# Patient Record
Sex: Female | Born: 1935
Health system: Southern US, Community
[De-identification: ages and names within clinical notes are randomized; demographics above are authoritative.]

## PROBLEM LIST (undated history)

## (undated) DIAGNOSIS — I639 Cerebral infarction, unspecified: Secondary | ICD-10-CM

## (undated) DIAGNOSIS — E039 Hypothyroidism, unspecified: Secondary | ICD-10-CM

## (undated) DIAGNOSIS — M5126 Other intervertebral disc displacement, lumbar region: Secondary | ICD-10-CM

## (undated) DIAGNOSIS — N390 Urinary tract infection, site not specified: Secondary | ICD-10-CM

## (undated) DIAGNOSIS — R7303 Prediabetes: Secondary | ICD-10-CM

## (undated) DIAGNOSIS — F329 Major depressive disorder, single episode, unspecified: Secondary | ICD-10-CM

## (undated) DIAGNOSIS — R609 Edema, unspecified: Secondary | ICD-10-CM

## (undated) DIAGNOSIS — Z8619 Personal history of other infectious and parasitic diseases: Secondary | ICD-10-CM

## (undated) DIAGNOSIS — R42 Dizziness and giddiness: Secondary | ICD-10-CM

## (undated) DIAGNOSIS — C50919 Malignant neoplasm of unspecified site of unspecified female breast: Secondary | ICD-10-CM

## (undated) DIAGNOSIS — E079 Disorder of thyroid, unspecified: Secondary | ICD-10-CM

## (undated) DIAGNOSIS — E669 Obesity, unspecified: Secondary | ICD-10-CM

## (undated) DIAGNOSIS — M1990 Unspecified osteoarthritis, unspecified site: Secondary | ICD-10-CM

## (undated) DIAGNOSIS — Z973 Presence of spectacles and contact lenses: Secondary | ICD-10-CM

## (undated) DIAGNOSIS — I10 Essential (primary) hypertension: Secondary | ICD-10-CM

## (undated) DIAGNOSIS — F32A Depression, unspecified: Secondary | ICD-10-CM

## (undated) DIAGNOSIS — M5410 Radiculopathy, site unspecified: Secondary | ICD-10-CM

## (undated) HISTORY — PX: TOTAL KNEE ARTHROPLASTY: SHX125

## (undated) HISTORY — DX: Malignant neoplasm of unspecified site of unspecified female breast: C50.919

## (undated) HISTORY — DX: Prediabetes: R73.03

## (undated) HISTORY — PX: COLONOSCOPY W/ BIOPSIES AND POLYPECTOMY: SHX1376

## (undated) HISTORY — DX: Unspecified osteoarthritis, unspecified site: M19.90

## (undated) HISTORY — DX: Essential (primary) hypertension: I10

## (undated) HISTORY — DX: Disorder of thyroid, unspecified: E07.9

## (undated) HISTORY — PX: JOINT REPLACEMENT: SHX530

## (undated) HISTORY — PX: EYE SURGERY: SHX253

## (undated) HISTORY — PX: BACK SURGERY: SHX140

## (undated) HISTORY — PX: BREAST SURGERY: SHX581

## (undated) HISTORY — PX: KNEE ARTHROSCOPY: SUR90

## (undated) HISTORY — DX: Radiculopathy, site unspecified: M54.10

## (undated) HISTORY — DX: Personal history of other infectious and parasitic diseases: Z86.19

## (undated) HISTORY — DX: Obesity, unspecified: E66.9

## (undated) HISTORY — DX: Edema, unspecified: R60.9

## (undated) HISTORY — PX: TONSILLECTOMY: SUR1361

## (undated) NOTE — *Deleted (*Deleted)
Patient alert, cooperative denies pain, verbalize concerning waiting to go back to her home but was sad tha she could not " I have lost my home and now I have to find somewhere else to live because I had this stroke".Encourage verbalization of he concerns ,reasurrance and support provided. States she had  Spoken to her family and they were supportive. Monitor and assisted with tolecting and basic ADL needs continue regime, call bell within reach, bed alarm on.

## (undated) NOTE — *Deleted (*Deleted)
Physical Therapy Weekly Progress Note  Patient Details  Name: Tara [name] MRN: [id number] Date of Birth: [birth year]  Beginning of progress report period: {Time; dates multiple:304500300} End of progress report period: {Time; dates multiple:304500300}  {CHL IP REHAB PT TIME CALCULATION:304800500}  Patient has met {number 1-5:22450} of {number 1-5:20334} short term goals.  ***  Patient continues to demonstrate the following deficits {impairments:3041632} and therefore will continue to benefit from skilled PT intervention to increase functional independence with mobility.  Patient {LTG progression:3041653}.  {plan of WUJW:1191478}  PT Short Term Goals {GNF:6213086}  Skilled Therapeutic Interventions/Progress Updates:      Therapy Documentation Precautions:  Precautions Precautions: Fall Precaution Comments: history of vertigo, R field cut Restrictions Weight Bearing Restrictions: No General:   Vital Signs:  Pain: Pain Assessment Pain Scale: 0-10 Pain Score: 0-No pain Faces Pain Scale: No hurt Pain Type: Acute pain Vision/Perception     Mobility:   Locomotion :    Trunk/Postural Assessment :    Balance:   Exercises:   Other Treatments:     Therapy/Group: {Therapy/Group:3049007}  Westley Foots 09/14/2020, 1:45 PM
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
## 1992-11-21 HISTORY — PX: ANKLE SURGERY: SHX546

## 1999-01-25 ENCOUNTER — Other Ambulatory Visit: Admission: RE | Admit: 1999-01-25 | Discharge: 1999-01-25 | Payer: Self-pay | Admitting: Obstetrics and Gynecology

## 2000-01-05 ENCOUNTER — Encounter: Payer: Self-pay | Admitting: Internal Medicine

## 2000-01-05 ENCOUNTER — Encounter: Admission: RE | Admit: 2000-01-05 | Discharge: 2000-01-05 | Payer: Self-pay | Admitting: Internal Medicine

## 2000-12-29 ENCOUNTER — Other Ambulatory Visit: Admission: RE | Admit: 2000-12-29 | Discharge: 2000-12-29 | Payer: Self-pay | Admitting: Obstetrics and Gynecology

## 2002-01-02 ENCOUNTER — Other Ambulatory Visit: Admission: RE | Admit: 2002-01-02 | Discharge: 2002-01-02 | Payer: Self-pay | Admitting: Obstetrics and Gynecology

## 2003-02-03 ENCOUNTER — Other Ambulatory Visit: Admission: RE | Admit: 2003-02-03 | Discharge: 2003-02-03 | Payer: Self-pay | Admitting: Obstetrics and Gynecology

## 2003-04-11 ENCOUNTER — Encounter: Payer: Self-pay | Admitting: Internal Medicine

## 2003-04-11 ENCOUNTER — Encounter: Admission: RE | Admit: 2003-04-11 | Discharge: 2003-04-11 | Payer: Self-pay | Admitting: Internal Medicine

## 2005-05-19 ENCOUNTER — Other Ambulatory Visit: Admission: RE | Admit: 2005-05-19 | Discharge: 2005-05-19 | Payer: Self-pay | Admitting: Obstetrics and Gynecology

## 2006-05-26 ENCOUNTER — Other Ambulatory Visit: Admission: RE | Admit: 2006-05-26 | Discharge: 2006-05-26 | Payer: Self-pay | Admitting: Obstetrics and Gynecology

## 2007-05-09 ENCOUNTER — Other Ambulatory Visit: Admission: RE | Admit: 2007-05-09 | Discharge: 2007-05-09 | Payer: Self-pay | Admitting: Obstetrics and Gynecology

## 2008-05-20 ENCOUNTER — Other Ambulatory Visit: Admission: RE | Admit: 2008-05-20 | Discharge: 2008-05-20 | Payer: Self-pay | Admitting: Obstetrics and Gynecology

## 2008-06-02 ENCOUNTER — Encounter: Admission: RE | Admit: 2008-06-02 | Discharge: 2008-06-02 | Payer: Self-pay | Admitting: Internal Medicine

## 2008-09-30 ENCOUNTER — Ambulatory Visit: Payer: Self-pay | Admitting: Internal Medicine

## 2008-12-04 ENCOUNTER — Ambulatory Visit: Payer: Self-pay | Admitting: Internal Medicine

## 2009-06-05 ENCOUNTER — Ambulatory Visit: Payer: Self-pay | Admitting: Internal Medicine

## 2009-06-16 ENCOUNTER — Ambulatory Visit: Payer: Self-pay | Admitting: Internal Medicine

## 2009-06-19 LAB — HM COLONOSCOPY

## 2009-06-23 ENCOUNTER — Ambulatory Visit: Payer: Self-pay | Admitting: Obstetrics and Gynecology

## 2009-06-23 ENCOUNTER — Encounter: Payer: Self-pay | Admitting: Obstetrics and Gynecology

## 2009-06-23 ENCOUNTER — Other Ambulatory Visit: Admission: RE | Admit: 2009-06-23 | Discharge: 2009-06-23 | Payer: Self-pay | Admitting: Obstetrics and Gynecology

## 2009-06-24 LAB — HM MAMMOGRAPHY

## 2009-07-01 ENCOUNTER — Ambulatory Visit: Payer: Self-pay | Admitting: Obstetrics and Gynecology

## 2009-07-20 ENCOUNTER — Ambulatory Visit: Payer: Self-pay | Admitting: Internal Medicine

## 2009-09-29 ENCOUNTER — Ambulatory Visit: Payer: Self-pay | Admitting: Obstetrics and Gynecology

## 2009-11-21 HISTORY — PX: ROTATOR CUFF REPAIR: SHX139

## 2010-01-19 ENCOUNTER — Encounter: Admission: RE | Admit: 2010-01-19 | Discharge: 2010-01-19 | Payer: Self-pay | Admitting: Orthopedic Surgery

## 2010-01-28 ENCOUNTER — Ambulatory Visit: Payer: Self-pay | Admitting: Internal Medicine

## 2010-08-19 ENCOUNTER — Other Ambulatory Visit: Admission: RE | Admit: 2010-08-19 | Discharge: 2010-08-19 | Payer: Self-pay | Admitting: Obstetrics and Gynecology

## 2010-08-19 ENCOUNTER — Ambulatory Visit: Payer: Self-pay | Admitting: Obstetrics and Gynecology

## 2010-09-07 ENCOUNTER — Ambulatory Visit: Payer: Self-pay | Admitting: Obstetrics and Gynecology

## 2010-09-13 ENCOUNTER — Ambulatory Visit: Payer: Self-pay | Admitting: Internal Medicine

## 2011-03-14 ENCOUNTER — Ambulatory Visit (INDEPENDENT_AMBULATORY_CARE_PROVIDER_SITE_OTHER): Payer: PRIVATE HEALTH INSURANCE | Admitting: Internal Medicine

## 2011-03-14 ENCOUNTER — Ambulatory Visit: Payer: Self-pay | Admitting: Internal Medicine

## 2011-03-14 DIAGNOSIS — I10 Essential (primary) hypertension: Secondary | ICD-10-CM

## 2011-03-14 DIAGNOSIS — N390 Urinary tract infection, site not specified: Secondary | ICD-10-CM

## 2011-03-15 ENCOUNTER — Ambulatory Visit: Payer: Self-pay | Admitting: Internal Medicine

## 2011-04-15 ENCOUNTER — Encounter: Payer: Self-pay | Admitting: Internal Medicine

## 2011-04-15 ENCOUNTER — Ambulatory Visit (INDEPENDENT_AMBULATORY_CARE_PROVIDER_SITE_OTHER): Payer: PRIVATE HEALTH INSURANCE | Admitting: Internal Medicine

## 2011-04-15 ENCOUNTER — Ambulatory Visit: Payer: PRIVATE HEALTH INSURANCE | Admitting: Internal Medicine

## 2011-04-15 ENCOUNTER — Other Ambulatory Visit: Payer: Self-pay | Admitting: *Deleted

## 2011-04-15 VITALS — BP 126/70 | HR 76 | Temp 98.1°F | Wt 226.0 lb

## 2011-04-15 DIAGNOSIS — I10 Essential (primary) hypertension: Secondary | ICD-10-CM | POA: Insufficient documentation

## 2011-04-15 DIAGNOSIS — R300 Dysuria: Secondary | ICD-10-CM

## 2011-04-15 DIAGNOSIS — M5430 Sciatica, unspecified side: Secondary | ICD-10-CM

## 2011-04-15 HISTORY — DX: Essential (primary) hypertension: I10

## 2011-04-15 LAB — POCT URINALYSIS DIPSTICK
Bilirubin, UA: NEGATIVE
Blood, UA: NEGATIVE
Glucose, UA: NEGATIVE
Ketones, UA: NEGATIVE
Nitrite, UA: POSITIVE
Protein, UA: NEGATIVE
Spec Grav, UA: 1.005
Urobilinogen, UA: NEGATIVE
pH, UA: 7.5

## 2011-04-15 MED ORDER — PREDNISONE 10 MG PO KIT: PACK | ORAL | Status: DC

## 2011-04-15 MED ORDER — HYDROCODONE-ACETAMINOPHEN 5-500 MG PO TABS: 1.0000 | ORAL_TABLET | Freq: Four times a day (QID) | ORAL | Status: AC | PRN

## 2011-04-15 NOTE — Patient Instructions (Signed)
Take meds as directed. Call if sxs persist so we can order PT.

## 2011-04-15 NOTE — Progress Notes (Signed)
  Subjective:    Patient ID: Tara [name], female    DOB: 02/23/1936, 75 y.o.   MRN: [id number]  HPI  Onset 4 days ago low back pain especially in left buttock. Going on vacation in 2 days. Hurts to get up from a chair. Walking is painful. No weakness in legs. Steps are hard to negotiate. Taking Baby ASA and Tylenol Arthritis  OTC.    Review of Systems     Objective:   Physical Exam    Straight leg raising at 90 degrees is negative but pt. is moving slowly. Muscle strength is 4/5 in LLE. Has osteoarthritis left knee.    Assessment & Plan:  1-Musculoskeletal pain low pain ? Sciatica. Plan is to give Sterapred DS 10 6 day dosepack for inflammation and Vicodin 5/500 # 40 one tab q 6 hours prn pain. May need PT upon return. May want to do water exercises again at [facility] when better.

## 2011-04-18 LAB — URINE CULTURE: Colony Count: >=100000

## 2011-04-19 ENCOUNTER — Telehealth: Payer: Self-pay | Admitting: *Deleted

## 2011-04-19 MED ORDER — CIPROFLOXACIN HCL 500 MG PO TABS: 500.0000 mg | ORAL_TABLET | Freq: Two times a day (BID) | ORAL | Status: AC

## 2011-04-19 NOTE — Telephone Encounter (Signed)
Left message for patient to call.  Rx for Cipro sent to pharmacy for UTI.

## 2011-04-20 ENCOUNTER — Telehealth: Payer: Self-pay

## 2011-04-25 NOTE — Telephone Encounter (Signed)
See note re: medication

## 2011-05-05 ENCOUNTER — Ambulatory Visit (INDEPENDENT_AMBULATORY_CARE_PROVIDER_SITE_OTHER): Payer: PRIVATE HEALTH INSURANCE | Admitting: Internal Medicine

## 2011-05-05 VITALS — Temp 97.7°F

## 2011-05-05 DIAGNOSIS — N390 Urinary tract infection, site not specified: Secondary | ICD-10-CM

## 2011-05-05 LAB — POCT URINALYSIS DIPSTICK
Bilirubin, UA: NEGATIVE
Blood, UA: NEGATIVE
Glucose, UA: NEGATIVE
Ketones, UA: NEGATIVE
Leukocytes, UA: NEGATIVE
Nitrite, UA: NEGATIVE
Protein, UA: NEGATIVE
Spec Grav, UA: 1.005
Urobilinogen, UA: NEGATIVE
pH, UA: 7

## 2011-05-05 NOTE — Progress Notes (Signed)
Pt in office today for a follow-up of a UTI.  Afebrile. U/A dip WNL.  Pt reports no symptoms.  Pt instructed to call office if any other concerns arise.

## 2011-05-30 ENCOUNTER — Encounter: Payer: Self-pay | Admitting: Internal Medicine

## 2011-05-30 ENCOUNTER — Ambulatory Visit (INDEPENDENT_AMBULATORY_CARE_PROVIDER_SITE_OTHER): Payer: PRIVATE HEALTH INSURANCE | Admitting: Internal Medicine

## 2011-05-30 DIAGNOSIS — I10 Essential (primary) hypertension: Secondary | ICD-10-CM

## 2011-05-30 DIAGNOSIS — N390 Urinary tract infection, site not specified: Secondary | ICD-10-CM

## 2011-05-30 DIAGNOSIS — Z23 Encounter for immunization: Secondary | ICD-10-CM

## 2011-05-30 DIAGNOSIS — E119 Type 2 diabetes mellitus without complications: Secondary | ICD-10-CM

## 2011-05-30 DIAGNOSIS — R32 Unspecified urinary incontinence: Secondary | ICD-10-CM

## 2011-05-30 DIAGNOSIS — Z0000 Encounter for general adult medical examination without abnormal findings: Secondary | ICD-10-CM

## 2011-05-30 DIAGNOSIS — H8110 Benign paroxysmal vertigo, unspecified ear: Secondary | ICD-10-CM

## 2011-05-30 DIAGNOSIS — D126 Benign neoplasm of colon, unspecified: Secondary | ICD-10-CM

## 2011-05-30 DIAGNOSIS — M170 Bilateral primary osteoarthritis of knee: Secondary | ICD-10-CM

## 2011-05-30 DIAGNOSIS — M1710 Unilateral primary osteoarthritis, unspecified knee: Secondary | ICD-10-CM

## 2011-05-30 DIAGNOSIS — R7309 Other abnormal glucose: Secondary | ICD-10-CM

## 2011-05-30 DIAGNOSIS — E039 Hypothyroidism, unspecified: Secondary | ICD-10-CM

## 2011-05-30 DIAGNOSIS — R7303 Prediabetes: Secondary | ICD-10-CM

## 2011-05-30 DIAGNOSIS — R609 Edema, unspecified: Secondary | ICD-10-CM

## 2011-05-30 LAB — CBC WITH DIFFERENTIAL/PLATELET
Basophils Absolute: 0.1 10*3/uL (ref 0.0–0.1)
Basophils Relative: 1 % (ref 0–1)
Eosinophils Absolute: 0.3 10*3/uL (ref 0.0–0.7)
Eosinophils Relative: 3 % (ref 0–5)
HCT: 41.4 % (ref 36.0–46.0)
Hemoglobin: 13.4 g/dL (ref 12.0–15.0)
Lymphocytes Relative: 45 % (ref 12–46)
Lymphs Abs: 3.6 10*3/uL (ref 0.7–4.0)
MCH: 32.1 pg (ref 26.0–34.0)
MCHC: 32.4 g/dL (ref 30.0–36.0)
MCV: 99.3 fL (ref 78.0–100.0)
Monocytes Absolute: 0.5 10*3/uL (ref 0.1–1.0)
Monocytes Relative: 6 % (ref 3–12)
Neutro Abs: 3.7 10*3/uL (ref 1.7–7.7)
Neutrophils Relative %: 45 % (ref 43–77)
Platelets: 300 10*3/uL (ref 150–400)
RBC: 4.17 MIL/uL (ref 3.87–5.11)
RDW: 13.6 % (ref 11.5–15.5)
WBC: 8.1 10*3/uL (ref 4.0–10.5)

## 2011-05-30 LAB — COMPREHENSIVE METABOLIC PANEL
ALT: 22 U/L (ref 0–35)
AST: 25 U/L (ref 0–37)
Albumin: 4.2 g/dL (ref 3.5–5.2)
Alkaline Phosphatase: 79 U/L (ref 39–117)
BUN: 18 mg/dL (ref 6–23)
CO2: 29 mEq/L (ref 19–32)
Calcium: 10.2 mg/dL (ref 8.4–10.5)
Chloride: 102 mEq/L (ref 96–112)
Creat: 0.86 mg/dL (ref 0.50–1.10)
Glucose, Bld: 86 mg/dL (ref 70–99)
Potassium: 4.1 mEq/L (ref 3.5–5.3)
Sodium: 141 mEq/L (ref 135–145)
Total Bilirubin: 0.6 mg/dL (ref 0.3–1.2)
Total Protein: 7.3 g/dL (ref 6.0–8.3)

## 2011-05-30 LAB — LIPID PANEL
Cholesterol: 206 mg/dL — ABNORMAL HIGH (ref 0–200)
HDL: 50 mg/dL (ref >39)
LDL Cholesterol: 124 mg/dL — ABNORMAL HIGH (ref 0–99)
Total CHOL/HDL Ratio: 4.1 Ratio
Triglycerides: 158 mg/dL — ABNORMAL HIGH (ref <150)
VLDL: 32 mg/dL (ref 0–40)

## 2011-05-30 LAB — POCT URINALYSIS DIPSTICK
Bilirubin, UA: NEGATIVE
Blood, UA: NEGATIVE
Glucose, UA: NEGATIVE
Ketones, UA: NEGATIVE
Leukocytes, UA: NEGATIVE
Nitrite, UA: NEGATIVE
Protein, UA: NEGATIVE
Spec Grav, UA: 1.01
Urobilinogen, UA: NEGATIVE
pH, UA: 8

## 2011-05-30 LAB — TSH: TSH: 2.452 u[IU]/mL (ref 0.350–4.500)

## 2011-05-30 MED ORDER — PNEUMOCOCCAL VAC POLYVALENT 25 MCG/0.5ML IJ INJ
0.5000 mL | INJECTION | Freq: Once | INTRAMUSCULAR | Status: AC
  Administered 2011-05-30: 0.5 mL via INTRAMUSCULAR

## 2011-05-31 ENCOUNTER — Encounter: Payer: Self-pay | Admitting: Internal Medicine

## 2011-05-31 LAB — HEMOGLOBIN A1C
Hgb A1c MFr Bld: 6.1 % — ABNORMAL HIGH (ref <5.7)
Mean Plasma Glucose: 128 mg/dL — ABNORMAL HIGH (ref <117)

## 2011-05-31 LAB — VITAMIN D 25 HYDROXY (VIT D DEFICIENCY, FRACTURES): Vit D, 25-Hydroxy: 41 ng/mL (ref 30–89)

## 2011-06-09 ENCOUNTER — Other Ambulatory Visit (HOSPITAL_COMMUNITY): Payer: Self-pay | Admitting: Orthopedic Surgery

## 2011-06-09 ENCOUNTER — Encounter (HOSPITAL_COMMUNITY)
Admission: RE | Admit: 2011-06-09 | Discharge: 2011-06-09 | Disposition: A | Discharge status: Home / Self Care | Payer: PRIVATE HEALTH INSURANCE | Source: Ambulatory Visit | Attending: Orthopedic Surgery | Admitting: Orthopedic Surgery

## 2011-06-09 ENCOUNTER — Ambulatory Visit (HOSPITAL_COMMUNITY)
Admission: RE | Admit: 2011-06-09 | Discharge: 2011-06-09 | Disposition: A | Discharge status: Home / Self Care | Payer: PRIVATE HEALTH INSURANCE | Source: Ambulatory Visit | Attending: Orthopedic Surgery | Admitting: Orthopedic Surgery

## 2011-06-09 DIAGNOSIS — I10 Essential (primary) hypertension: Secondary | ICD-10-CM | POA: Insufficient documentation

## 2011-06-09 DIAGNOSIS — M1712 Unilateral primary osteoarthritis, left knee: Secondary | ICD-10-CM

## 2011-06-09 DIAGNOSIS — Z01812 Encounter for preprocedural laboratory examination: Secondary | ICD-10-CM | POA: Insufficient documentation

## 2011-06-09 DIAGNOSIS — Z01818 Encounter for other preprocedural examination: Secondary | ICD-10-CM | POA: Insufficient documentation

## 2011-06-09 DIAGNOSIS — M1710 Unilateral primary osteoarthritis, unspecified knee: Secondary | ICD-10-CM | POA: Insufficient documentation

## 2011-06-09 DIAGNOSIS — IMO0002 Reserved for concepts with insufficient information to code with codable children: Secondary | ICD-10-CM | POA: Insufficient documentation

## 2011-06-09 DIAGNOSIS — Z01810 Encounter for preprocedural cardiovascular examination: Secondary | ICD-10-CM | POA: Insufficient documentation

## 2011-06-09 LAB — COMPREHENSIVE METABOLIC PANEL
ALT: 23 U/L (ref 0–35)
AST: 26 U/L (ref 0–37)
Albumin: 3.8 g/dL (ref 3.5–5.2)
Alkaline Phosphatase: 86 U/L (ref 39–117)
BUN: 16 mg/dL (ref 6–23)
CO2: 26 mEq/L (ref 19–32)
Calcium: 9.8 mg/dL (ref 8.4–10.5)
Chloride: 102 mEq/L (ref 96–112)
Creatinine, Ser: 0.81 mg/dL (ref 0.50–1.10)
GFR calc Af Amer: >60 mL/min (ref >60)
GFR calc non Af Amer: >60 mL/min (ref >60)
Glucose, Bld: 95 mg/dL (ref 70–99)
Potassium: 3.8 mEq/L (ref 3.5–5.1)
Sodium: 141 mEq/L (ref 135–145)
Total Bilirubin: 0.7 mg/dL (ref 0.3–1.2)
Total Protein: 7.6 g/dL (ref 6.0–8.3)

## 2011-06-09 LAB — URINE MICROSCOPIC-ADD ON

## 2011-06-09 LAB — DIFFERENTIAL
Basophils Absolute: 0.1 10*3/uL (ref 0.0–0.1)
Basophils Relative: 2 % — ABNORMAL HIGH (ref 0–1)
Eosinophils Absolute: 0.3 10*3/uL (ref 0.0–0.7)
Eosinophils Relative: 4 % (ref 0–5)
Lymphocytes Relative: 48 % — ABNORMAL HIGH (ref 12–46)
Lymphs Abs: 3.8 10*3/uL (ref 0.7–4.0)
Monocytes Absolute: 0.7 10*3/uL (ref 0.1–1.0)
Monocytes Relative: 9 % (ref 3–12)
Neutro Abs: 2.9 10*3/uL (ref 1.7–7.7)
Neutrophils Relative %: 38 % — ABNORMAL LOW (ref 43–77)

## 2011-06-09 LAB — PROTIME-INR
INR: 1 (ref 0.00–1.49)
Prothrombin Time: 13.4 seconds (ref 11.6–15.2)

## 2011-06-09 LAB — CBC
HCT: 40 % (ref 36.0–46.0)
Hemoglobin: 13.7 g/dL (ref 12.0–15.0)
MCH: 32.1 pg (ref 26.0–34.0)
MCHC: 34.3 g/dL (ref 30.0–36.0)
MCV: 93.7 fL (ref 78.0–100.0)
Platelets: 274 10*3/uL (ref 150–400)
RBC: 4.27 MIL/uL (ref 3.87–5.11)
RDW: 12.8 % (ref 11.5–15.5)
WBC: 7.8 10*3/uL (ref 4.0–10.5)

## 2011-06-09 LAB — URINALYSIS, ROUTINE W REFLEX MICROSCOPIC
Bilirubin Urine: NEGATIVE
Glucose, UA: NEGATIVE mg/dL
Hgb urine dipstick: NEGATIVE
Ketones, ur: NEGATIVE mg/dL
Nitrite: NEGATIVE
Protein, ur: NEGATIVE mg/dL
Specific Gravity, Urine: 1.015 (ref 1.005–1.030)
Urobilinogen, UA: 0.2 mg/dL (ref 0.0–1.0)
pH: 7 (ref 5.0–8.0)

## 2011-06-09 LAB — SURGICAL PCR SCREEN
MRSA, PCR: NEGATIVE
Staphylococcus aureus: NEGATIVE

## 2011-06-09 LAB — APTT: aPTT: 28 seconds (ref 24–37)

## 2011-06-10 LAB — URINE CULTURE
Colony Count: NO GROWTH
Culture  Setup Time: 201207192209
Culture: NO GROWTH

## 2011-06-13 ENCOUNTER — Other Ambulatory Visit: Payer: Self-pay | Admitting: Internal Medicine

## 2011-06-13 NOTE — Telephone Encounter (Signed)
This was done already by e-scribe. Please call pharmacy

## 2011-06-19 ENCOUNTER — Encounter: Payer: Self-pay | Admitting: Internal Medicine

## 2011-06-19 DIAGNOSIS — H8110 Benign paroxysmal vertigo, unspecified ear: Secondary | ICD-10-CM | POA: Insufficient documentation

## 2011-06-19 DIAGNOSIS — R609 Edema, unspecified: Secondary | ICD-10-CM | POA: Insufficient documentation

## 2011-06-19 DIAGNOSIS — R32 Unspecified urinary incontinence: Secondary | ICD-10-CM | POA: Insufficient documentation

## 2011-06-19 DIAGNOSIS — R7303 Prediabetes: Secondary | ICD-10-CM | POA: Insufficient documentation

## 2011-06-19 DIAGNOSIS — D126 Benign neoplasm of colon, unspecified: Secondary | ICD-10-CM | POA: Insufficient documentation

## 2011-06-19 DIAGNOSIS — N390 Urinary tract infection, site not specified: Secondary | ICD-10-CM | POA: Insufficient documentation

## 2011-06-19 DIAGNOSIS — E039 Hypothyroidism, unspecified: Secondary | ICD-10-CM | POA: Insufficient documentation
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
## 2011-06-20 ENCOUNTER — Inpatient Hospital Stay (HOSPITAL_COMMUNITY)
Admission: RE | Admit: 2011-06-20 | Discharge: 2011-06-23 | DRG: 470 | Disposition: A | Discharge status: Home Health | Payer: PRIVATE HEALTH INSURANCE | Source: Ambulatory Visit | Attending: Orthopedic Surgery | Admitting: Orthopedic Surgery

## 2011-06-20 DIAGNOSIS — K5900 Constipation, unspecified: Secondary | ICD-10-CM | POA: Diagnosis present

## 2011-06-20 DIAGNOSIS — IMO0002 Reserved for concepts with insufficient information to code with codable children: Principal | ICD-10-CM | POA: Diagnosis present

## 2011-06-20 DIAGNOSIS — M1710 Unilateral primary osteoarthritis, unspecified knee: Principal | ICD-10-CM | POA: Diagnosis present

## 2011-06-20 DIAGNOSIS — E7800 Pure hypercholesterolemia, unspecified: Secondary | ICD-10-CM | POA: Diagnosis present

## 2011-06-20 DIAGNOSIS — E876 Hypokalemia: Secondary | ICD-10-CM | POA: Diagnosis not present

## 2011-06-20 DIAGNOSIS — D62 Acute posthemorrhagic anemia: Secondary | ICD-10-CM | POA: Diagnosis not present

## 2011-06-20 DIAGNOSIS — I10 Essential (primary) hypertension: Secondary | ICD-10-CM | POA: Diagnosis present

## 2011-06-20 LAB — TYPE AND SCREEN
ABO/RH(D): O POS
Antibody Screen: NEGATIVE

## 2011-06-20 LAB — ABO/RH: ABO/RH(D): O POS

## 2011-06-21 LAB — BASIC METABOLIC PANEL
BUN: 10 mg/dL (ref 6–23)
CO2: 26 mEq/L (ref 19–32)
Calcium: 8.8 mg/dL (ref 8.4–10.5)
Chloride: 103 mEq/L (ref 96–112)
Creatinine, Ser: 0.62 mg/dL (ref 0.50–1.10)
GFR calc Af Amer: >60 mL/min (ref >60)
GFR calc non Af Amer: >60 mL/min (ref >60)
Glucose, Bld: 121 mg/dL — ABNORMAL HIGH (ref 70–99)
Potassium: 4 mEq/L (ref 3.5–5.1)
Sodium: 136 mEq/L (ref 135–145)

## 2011-06-21 LAB — CBC
HCT: 34.4 % — ABNORMAL LOW (ref 36.0–46.0)
Hemoglobin: 11.7 g/dL — ABNORMAL LOW (ref 12.0–15.0)
MCH: 31.8 pg (ref 26.0–34.0)
MCHC: 34 g/dL (ref 30.0–36.0)
MCV: 93.5 fL (ref 78.0–100.0)
Platelets: 251 10*3/uL (ref 150–400)
RBC: 3.68 MIL/uL — ABNORMAL LOW (ref 3.87–5.11)
RDW: 12.5 % (ref 11.5–15.5)
WBC: 12.7 10*3/uL — ABNORMAL HIGH (ref 4.0–10.5)

## 2011-06-22 LAB — BASIC METABOLIC PANEL
BUN: 9 mg/dL (ref 6–23)
CO2: 28 mEq/L (ref 19–32)
Calcium: 8.8 mg/dL (ref 8.4–10.5)
Chloride: 108 mEq/L (ref 96–112)
Creatinine, Ser: 0.58 mg/dL (ref 0.50–1.10)
GFR calc Af Amer: >60 mL/min (ref >60)
GFR calc non Af Amer: >60 mL/min (ref >60)
Glucose, Bld: 109 mg/dL — ABNORMAL HIGH (ref 70–99)
Potassium: 4 mEq/L (ref 3.5–5.1)
Sodium: 141 mEq/L (ref 135–145)

## 2011-06-22 LAB — CBC
HCT: 32.2 % — ABNORMAL LOW (ref 36.0–46.0)
Hemoglobin: 10.7 g/dL — ABNORMAL LOW (ref 12.0–15.0)
MCH: 31.8 pg (ref 26.0–34.0)
MCHC: 33.2 g/dL (ref 30.0–36.0)
MCV: 95.5 fL (ref 78.0–100.0)
Platelets: 221 10*3/uL (ref 150–400)
RBC: 3.37 MIL/uL — ABNORMAL LOW (ref 3.87–5.11)
RDW: 12.9 % (ref 11.5–15.5)
WBC: 10.6 10*3/uL — ABNORMAL HIGH (ref 4.0–10.5)

## 2011-06-23 LAB — BASIC METABOLIC PANEL
BUN: 7 mg/dL (ref 6–23)
CO2: 28 mEq/L (ref 19–32)
Calcium: 8.4 mg/dL (ref 8.4–10.5)
Chloride: 105 mEq/L (ref 96–112)
Creatinine, Ser: 0.51 mg/dL (ref 0.50–1.10)
GFR calc Af Amer: >60 mL/min (ref >60)
GFR calc non Af Amer: >60 mL/min (ref >60)
Glucose, Bld: 104 mg/dL — ABNORMAL HIGH (ref 70–99)
Potassium: 3.9 mEq/L (ref 3.5–5.1)
Sodium: 138 mEq/L (ref 135–145)

## 2011-06-23 LAB — CBC
HCT: 29.4 % — ABNORMAL LOW (ref 36.0–46.0)
Hemoglobin: 10 g/dL — ABNORMAL LOW (ref 12.0–15.0)
MCH: 32.3 pg (ref 26.0–34.0)
MCHC: 34 g/dL (ref 30.0–36.0)
MCV: 94.8 fL (ref 78.0–100.0)
Platelets: 207 10*3/uL (ref 150–400)
RBC: 3.1 MIL/uL — ABNORMAL LOW (ref 3.87–5.11)
RDW: 12.9 % (ref 11.5–15.5)
WBC: 9.4 10*3/uL (ref 4.0–10.5)

## 2011-07-06 NOTE — Op Note (Signed)
Tara [name], Tara [name]              ACCOUNT NO.:  [id number]  MEDICAL RECORD NO.:  [id number]  LOCATION:  5001                         FACILITY:  [facility]  PHYSICIAN:  Alvin Rubano A. Thurston Hole, M.D. DATE OF BIRTH:  12/26/1935  DATE OF PROCEDURE:  06/20/2011 DATE OF DISCHARGE:                              OPERATIVE REPORT   PREOPERATIVE DIAGNOSIS:  Left knee degenerative joint disease.  POSTOPERATIVE DIAGNOSIS:  Left knee degenerative joint disease.  PROCEDURE: 1. Left total knee replacement using DePuy cemented total knee system     with #4 narrow cemented femur, #4 cemented tibia with 12.5 mm     polyethylene RP tibial spacer, and 35 mm polyethylene cemented     patella. 2. Zinacef impregnated cement.  SURGEON:  Elana Alm. Thurston Hole, MD.  ASSISTANT:  Kirstin Shepperson, PA-C.  ANESTHESIA:  General.  OPERATIVE TIME:  1 hour and 30 minutes.  COMPLICATIONS:  None.  DESCRIPTION OF PROCEDURE:  Tara [name] was brought to the operating room on June 20, 2011 after a femoral nerve block was placed in the holding room by Anesthesia.  She was placed on the operative table in supine position.  She received Ancef 2 grams IV preoperatively for prophylaxis. After being placed under general anesthesia, she had a Foley catheter placed under sterile conditions.  Latex precautions were undertaken due to her latex allergy.  Her left knee was examined.  She had range of motion from -5 to 125 degrees, mild valgus deformity, knee stable ligamentous exam with normal patellar tracking.  Left leg was prepped using sterile DuraPrep and draped using sterile technique.  Time-out procedure was called and the correct left knee identified.  Left leg was exsanguinated and a thigh tourniquet elevated 375 mm.  Initially, through a 15-cm longitudinal incision based over the patella, initial exposure was made.  The underlying subcutaneous tissues were incised along with skin incision.  A median arthrotomy was performed  revealing an excessive amount of normal-appearing joint fluid.  The articular surfaces were inspected.  She had grade 4 changes laterally, grade 3 and 4 changes medially and in the patellofemoral joint.  Osteophytes removed from the femoral condyles and tibial plateau.  The medial and lateral meniscal remnants were removed as well as the anterior cruciate ligament.  Intramedullary drill was then drilled up the femoral canal for placement of distal femoral cutting jig, which was placed in the appropriate manner of rotation, and a distal 12-mm cut was made.  The distal femur was incised.  #4 was found be the appropriate size.  #4 cutting jig was placed in the appropriate manner of external rotation and then these cuts were made.  Proximal tibia was then exposed.  The tibial spines were removed with an oscillating saw.  Intramedullary drill was drilled down the tibial canal for placement of proximal tibial cutting jig which was placed in the appropriate manner of rotation and proximal 6-mm cut was made based off the medial side.  Spacer blocks were then placed in flexion and extension.  A 12.5-mm block gave excellent balancing, excellent stability, and excellent correction of her flexion and valgus deformities.  A #4 tibial baseplate trial was then placed on the cut tibial surface  with an excellent fit and a keel cut was made.  PCL box cutter was then placed on the distal femur and these cuts were made.  At this point, a #4 narrow femoral trial was placed with a #4 tibial baseplate trial and 12.5-mm polyethylene RP tibial spacer, knee was reduced, taken through range of motion from 0 to 125 degrees with excellent stability and excellent correction of her flexion and valgus deformities.  Slight lateral patellar tracking was noted, thus lateral retinacular release was performed and this improved patellar tracking and normal.  At this point, a resurfacing 9-mm cut was made on the patella and  three locking holes were placed for a 35-mm polyethylene patellar trial and again patellofemoral tracking was evaluated and found to be normal.  At this point, it was felt that all the trial components were excellent size, fit, stability.  The trial components were then removed and the knee was then jet lavage irrigated with 3 liters of saline.  The proximal tibia was then exposed and the #4 tibial baseplate with Zinacef impregnated cement was hammered into position with an excellent fit with excess cement being removed from around the edges.  #4 narrow femoral component with cement backing was hammered into position also with an excellent fit with excess cement being removed from around the edges.  The 12.5-mm polyethylene RP tibial spacer was placed on tibial baseplate.  The knee reduced, taken through range of motion from 0 to 125 degrees with excellent stability and excellent correction of her flexion and valgus deformities.  A 35-mm polyethylene cement backed patella was then placed in its position and held there with a clamp.  After the cement hardened, again patellofemoral tracking was evaluated, found to be normal.  At this point, it was felt that all components were excellent size, fit, and stability.  The wound was further irrigated with saline and then the arthrotomy was closed with #1 Ethibond suture over two medium Hemovac drains.  Subcutaneous tissues were closed with 0 and 2-0 Vicryl, subcuticular layer closed with 4-0 Monocryl.  Sterile dressings and a long-leg splint applied.  The patient awakened, extubated, and taken to recovery room in stable condition.  Needle and sponge counts correct x2 at the end the case.  Neurovascular status normal.  Pulses 2+ and symmetric.     Lavonna Lampron A. Thurston Hole, M.D.     RAW/MEDQ  D:  06/20/2011  T:  06/20/2011  Job:  [id number]  Electronically Signed by Salvatore Marvel M.D. on 07/06/2011 04:53:24 PM

## 2011-07-13 ENCOUNTER — Ambulatory Visit: Payer: PRIVATE HEALTH INSURANCE | Attending: Orthopedic Surgery | Admitting: Rehabilitation

## 2011-07-13 DIAGNOSIS — R269 Unspecified abnormalities of gait and mobility: Secondary | ICD-10-CM | POA: Insufficient documentation

## 2011-07-13 DIAGNOSIS — Z96659 Presence of unspecified artificial knee joint: Secondary | ICD-10-CM | POA: Insufficient documentation

## 2011-07-13 DIAGNOSIS — M25569 Pain in unspecified knee: Secondary | ICD-10-CM | POA: Insufficient documentation

## 2011-07-13 DIAGNOSIS — IMO0001 Reserved for inherently not codable concepts without codable children: Secondary | ICD-10-CM | POA: Insufficient documentation

## 2011-07-14 ENCOUNTER — Ambulatory Visit: Payer: PRIVATE HEALTH INSURANCE | Admitting: Physical Therapy

## 2011-07-18 ENCOUNTER — Ambulatory Visit: Payer: PRIVATE HEALTH INSURANCE | Admitting: Physical Therapy

## 2011-07-20 ENCOUNTER — Ambulatory Visit: Payer: PRIVATE HEALTH INSURANCE | Admitting: Physical Therapy

## 2011-07-21 ENCOUNTER — Other Ambulatory Visit: Payer: Self-pay | Admitting: Internal Medicine

## 2011-07-22 ENCOUNTER — Ambulatory Visit: Payer: PRIVATE HEALTH INSURANCE | Admitting: Physical Therapy

## 2011-07-26 NOTE — Discharge Summary (Signed)
Tara [name], Tara [name]              ACCOUNT NO.:  [id number]  MEDICAL RECORD NO.:  [id number]  LOCATION:  5001                         FACILITY:  [facility]  PHYSICIAN:  Delton Stelle A. Thurston Hole, M.D. DATE OF BIRTH:  1936-11-06  DATE OF ADMISSION:  06/20/2011 DATE OF DISCHARGE:  06/23/2011                              DISCHARGE SUMMARY   ADMITTING DIAGNOSES:  End-stage degenerative joint disease, left knee, hypertension, peripheral edema, hypokalemia, and high cholesterol.  DISCHARGE DIAGNOSES:  End-stage degenerative joint disease, left knee, status post left total knee replacement, acute blood loss anemia, peripheral edema, hypokalemia, high cholesterol, constipation, and hypertension.  HISTORY OF PRESENT ILLNESS:  The patient is a 76 year old white female with significant pain and swelling in her left knee.  She has failed conservative care including anti-inflammatories and articular cortisone injections.  Risks, benefits, and possible complications were discussed in detail with the patient and her family, and they elected to proceed with a left total knee replacement.  PROCEDURES IN-HOUSE:  On June 20, 2011, the patient underwent a left total knee replacement by Dr. Thurston Hole and left femoral nerve block by Anesthesia, and Autovac transfusion.  She tolerated these procedures well.  HOSPITAL COURSE:  Postoperatively, she was placed in a CPM 0-90 degrees in the PACU.  She had an uneventful course in the PACU and was transferred to the Orthopedic floor.  Postop day #1, Foley was discontinued in the morning.  Surgical wound was well-approximated.  Her drain was discontinued.  T-max was 99.3.  She was metabolically stable and her hemoglobin was stable at 11.7.  She tolerated her CPM well, but had difficulty with ambulation with lightheadedness.  She was given 500 mL boluses twice.  Blood pressure was not low, however, she did have some lightheadedness.  Postop day #2, she still had difficulty  with lightheadedness.  She was switched from oxycodone to Tylenol for pain. She felt significantly better on this.  Postop day #3, the patient is alert and oriented.  She is metabolically stable with a normal BMET. Her hemoglobin is stable at 10.0.  She is afebrile.  Surgical wound is well-approximated with minimal drainage.  She is ambulating 100 feet with no dizziness.  She is able to ambulate stairs.  She is drinking half a bottle of magnesium citrate for her constipation.  She is being discharged to home in stable condition, weightbearing as tolerated, on a regular diet.  DISCHARGE MEDICATIONS: 1. Tylenol 325 mg two tablets every 4 hours as needed for pain. 2. Lovenox 30 mg subcu b.i.d. for DVT prophylaxis for 14 days. 3. Torsemide 20 mg daily. 4. Potassium chloride 20 mEq two tablets daily. 5. Calcium carbonate with vitamin D one tablet twice a day. 6. Vitamin D3 400 units one tablet twice a day. 7. Losartan 100 mg one tablet daily. 8. Amlodipine 5 mg one tablet daily. 9. Synthroid 75 mcg one tablet daily.10.She has been instructed to stop taking her aspirin while she is on     Lovenox.  She will follow up with Dr. Thurston Hole in 2 weeks.  She is weightbearing as tolerated.  She is on a regular diet.  She is receiving home health physical therapy, home CPM, and  she has been instructed to use a yellow foam blocks for elevation of her left heel to work on extension.  When she is in bed or sitting, if she is in the CPM, she is not to use the yellow foam block.  She does not need to use her knee immobilizer with ambulation.     Kirstin Shepperson, PA-C   ______________________________ Elana Alm Thurston Hole, M.D.    KS/MEDQ  D:  06/23/2011  T:  06/23/2011  Job:  [id number]  Electronically Signed by Julien Girt P.A. on 07/13/2011 01:29:58 PM Electronically Signed by Salvatore Marvel M.D. on 07/26/2011 04:54:09 AM

## 2011-07-27 ENCOUNTER — Ambulatory Visit: Payer: PRIVATE HEALTH INSURANCE | Attending: Orthopedic Surgery | Admitting: Physical Therapy

## 2011-07-27 DIAGNOSIS — M25569 Pain in unspecified knee: Secondary | ICD-10-CM | POA: Insufficient documentation

## 2011-07-27 DIAGNOSIS — IMO0001 Reserved for inherently not codable concepts without codable children: Secondary | ICD-10-CM | POA: Insufficient documentation

## 2011-07-27 DIAGNOSIS — R269 Unspecified abnormalities of gait and mobility: Secondary | ICD-10-CM | POA: Insufficient documentation

## 2011-07-27 DIAGNOSIS — Z96659 Presence of unspecified artificial knee joint: Secondary | ICD-10-CM | POA: Insufficient documentation

## 2011-07-29 ENCOUNTER — Ambulatory Visit: Payer: PRIVATE HEALTH INSURANCE | Admitting: Physical Therapy

## 2011-08-01 ENCOUNTER — Ambulatory Visit: Payer: PRIVATE HEALTH INSURANCE | Admitting: Physical Therapy

## 2011-08-03 ENCOUNTER — Ambulatory Visit: Payer: PRIVATE HEALTH INSURANCE | Admitting: Physical Therapy

## 2011-08-05 ENCOUNTER — Ambulatory Visit: Payer: PRIVATE HEALTH INSURANCE | Admitting: Physical Therapy

## 2011-08-08 ENCOUNTER — Other Ambulatory Visit: Payer: Self-pay | Admitting: Internal Medicine

## 2011-08-09 ENCOUNTER — Ambulatory Visit: Payer: PRIVATE HEALTH INSURANCE | Admitting: Physical Therapy

## 2011-08-11 ENCOUNTER — Ambulatory Visit: Payer: PRIVATE HEALTH INSURANCE | Admitting: Physical Therapy

## 2011-08-16 ENCOUNTER — Ambulatory Visit: Payer: PRIVATE HEALTH INSURANCE | Admitting: Physical Therapy

## 2011-08-17 ENCOUNTER — Encounter: Payer: Self-pay | Admitting: Internal Medicine

## 2011-08-18 ENCOUNTER — Ambulatory Visit: Payer: PRIVATE HEALTH INSURANCE | Admitting: Physical Therapy

## 2011-08-23 ENCOUNTER — Ambulatory Visit: Payer: PRIVATE HEALTH INSURANCE | Attending: Orthopedic Surgery | Admitting: Physical Therapy

## 2011-08-23 ENCOUNTER — Encounter: Payer: Self-pay | Admitting: Obstetrics and Gynecology

## 2011-08-23 DIAGNOSIS — IMO0001 Reserved for inherently not codable concepts without codable children: Secondary | ICD-10-CM | POA: Insufficient documentation

## 2011-08-23 DIAGNOSIS — R269 Unspecified abnormalities of gait and mobility: Secondary | ICD-10-CM | POA: Insufficient documentation

## 2011-08-23 DIAGNOSIS — Z96659 Presence of unspecified artificial knee joint: Secondary | ICD-10-CM | POA: Insufficient documentation

## 2011-08-23 DIAGNOSIS — M25569 Pain in unspecified knee: Secondary | ICD-10-CM | POA: Insufficient documentation

## 2011-08-25 ENCOUNTER — Ambulatory Visit: Payer: PRIVATE HEALTH INSURANCE | Admitting: Physical Therapy

## 2011-08-30 ENCOUNTER — Ambulatory Visit: Payer: PRIVATE HEALTH INSURANCE | Admitting: Physical Therapy

## 2011-09-01 ENCOUNTER — Ambulatory Visit: Payer: PRIVATE HEALTH INSURANCE | Admitting: Physical Therapy

## 2011-09-02 ENCOUNTER — Encounter: Payer: Self-pay | Admitting: Internal Medicine

## 2011-09-02 ENCOUNTER — Ambulatory Visit (INDEPENDENT_AMBULATORY_CARE_PROVIDER_SITE_OTHER): Payer: PRIVATE HEALTH INSURANCE | Admitting: Internal Medicine

## 2011-09-02 VITALS — BP 132/72 | HR 78 | Temp 98.2°F | Ht 61.0 in | Wt 225.0 lb

## 2011-09-02 DIAGNOSIS — J069 Acute upper respiratory infection, unspecified: Secondary | ICD-10-CM

## 2011-09-05 ENCOUNTER — Ambulatory Visit: Payer: PRIVATE HEALTH INSURANCE | Admitting: Physical Therapy

## 2011-09-05 ENCOUNTER — Encounter: Payer: Self-pay | Admitting: Internal Medicine

## 2011-09-05 NOTE — Progress Notes (Signed)
  Subjective:    Patient ID: Tara [name], female    DOB: Apr 16, 1936, 75 y.o.   MRN: [id number]  HPI patient with history of hypertension, hypothyroidism, prediabetes, tubular adenoma of colon dependent edema, obesity, and urinary incontinence status post total left knee replacement late July 2012. Her recovery has been slow. She now has come down with an upper respiratory infection. There was a question of an abnormal EKG preoperatively. She was seen by Dr. Alanda Amass 06/15/2011 who thought her EKG was probably a normal variant related to left axis deviation with small R waves in the inferior leads. She had a Persantine Myoview study prior to surgery which was negative for ischemia.  Patient complaining of some discolored sputum production. Has sinus congestion. Some cough that is aggravating. Patient seems mildly depressed with lengthy recovery from knee replacement.      Review of Systems     Objective:   Physical Exam pharynx is slightly injected. TMs are clear bilaterally. Neck is supple without significant adenopathy. Chest clear. Blood pressure is under good control on current regimen.        Assessment & Plan:  Upper respiratory infection  Hypertension  Hypothyroidism  Obesity  Status post total left knee replacement July 2012  Plan: Zithromax Z-Pak 2 tablets by mouth day one followed by 1 tablet by mouth days 2 through 5. Hycodan 8 ounces 1 teaspoon by mouth every 6 hours when necessary cough. Nystatin tablets #5 by mouth daily to prevent candida vaginitis while on antibiotics.

## 2011-09-08 ENCOUNTER — Ambulatory Visit: Payer: PRIVATE HEALTH INSURANCE | Admitting: Physical Therapy

## 2011-09-12 ENCOUNTER — Other Ambulatory Visit: Payer: PRIVATE HEALTH INSURANCE | Admitting: Internal Medicine

## 2011-09-12 ENCOUNTER — Ambulatory Visit: Payer: PRIVATE HEALTH INSURANCE | Admitting: Physical Therapy

## 2011-09-13 ENCOUNTER — Ambulatory Visit: Payer: PRIVATE HEALTH INSURANCE | Admitting: Internal Medicine

## 2011-09-14 ENCOUNTER — Ambulatory Visit: Payer: PRIVATE HEALTH INSURANCE | Admitting: Physical Therapy

## 2011-09-20 ENCOUNTER — Ambulatory Visit: Payer: PRIVATE HEALTH INSURANCE | Admitting: Physical Therapy

## 2011-09-22 ENCOUNTER — Ambulatory Visit: Payer: PRIVATE HEALTH INSURANCE | Attending: Orthopedic Surgery | Admitting: Physical Therapy

## 2011-09-22 ENCOUNTER — Ambulatory Visit: Payer: PRIVATE HEALTH INSURANCE | Admitting: Internal Medicine

## 2011-09-22 DIAGNOSIS — Z96659 Presence of unspecified artificial knee joint: Secondary | ICD-10-CM | POA: Insufficient documentation

## 2011-09-22 DIAGNOSIS — R269 Unspecified abnormalities of gait and mobility: Secondary | ICD-10-CM | POA: Insufficient documentation

## 2011-09-22 DIAGNOSIS — M25569 Pain in unspecified knee: Secondary | ICD-10-CM | POA: Insufficient documentation

## 2011-09-22 DIAGNOSIS — IMO0001 Reserved for inherently not codable concepts without codable children: Secondary | ICD-10-CM | POA: Insufficient documentation

## 2011-09-27 ENCOUNTER — Ambulatory Visit: Payer: PRIVATE HEALTH INSURANCE | Admitting: Physical Therapy

## 2011-09-29 ENCOUNTER — Ambulatory Visit: Payer: PRIVATE HEALTH INSURANCE | Admitting: Physical Therapy

## 2011-11-28 ENCOUNTER — Ambulatory Visit (INDEPENDENT_AMBULATORY_CARE_PROVIDER_SITE_OTHER): Payer: PRIVATE HEALTH INSURANCE | Admitting: Internal Medicine

## 2011-11-28 ENCOUNTER — Encounter: Payer: Self-pay | Admitting: Internal Medicine

## 2011-11-28 VITALS — BP 144/76 | HR 84 | Temp 98.2°F | Wt 232.0 lb

## 2011-11-28 DIAGNOSIS — M25569 Pain in unspecified knee: Secondary | ICD-10-CM | POA: Diagnosis not present

## 2011-11-28 DIAGNOSIS — B379 Candidiasis, unspecified: Secondary | ICD-10-CM | POA: Diagnosis not present

## 2011-11-28 DIAGNOSIS — M7120 Synovial cyst of popliteal space [Baker], unspecified knee: Secondary | ICD-10-CM | POA: Diagnosis not present

## 2011-11-28 DIAGNOSIS — M1710 Unilateral primary osteoarthritis, unspecified knee: Secondary | ICD-10-CM | POA: Diagnosis not present

## 2011-11-28 DIAGNOSIS — M1711 Unilateral primary osteoarthritis, right knee: Secondary | ICD-10-CM

## 2011-11-29 MED ORDER — NYSTATIN 500000 UNITS PO TABS: 500000.0000 [IU] | ORAL_TABLET | Freq: Every day | ORAL | Status: AC

## 2011-11-30 DIAGNOSIS — M1711 Unilateral primary osteoarthritis, right knee: Secondary | ICD-10-CM | POA: Insufficient documentation

## 2011-11-30 DIAGNOSIS — M7120 Synovial cyst of popliteal space [Baker], unspecified knee: Secondary | ICD-10-CM | POA: Insufficient documentation

## 2011-11-30 NOTE — Progress Notes (Signed)
  Subjective:    Patient ID: Tara [name], female    DOB: 10-Apr-1936, 76 y.o.   MRN: [id number]  HPI Patient in today with URI symptoms for several days. Says she she is very tired and is considering retiring very soon. Left knee replacement has been hard to recover from. She now has Baker's cyst. Has appointment soon to see orthopedist. Left knee still hurts. Seems depressed about this. Has some discolored nasal drainage and nasal congestion. Has malaise and fatigue. No fever or shaking chills. No myalgias.    Review of Systems     Objective:   Physical Exam HEENT exam: TMs and pharynx are clear; neck is supple; no adenopathy;. Sounds nasally congested. Chest is clear. Baker's cyst noted left posterior knee popliteal space        Assessment & Plan:  Upper respiratory infection  Osteoarthritis right knee  Status post knee replacement left knee with Baker's cyst  History of hypertension  History of obesity  Plan: Zithromax Z-Pak take as corrected. Take Nystatin tablet with each dose of antibiotic to prevent Candida vaginitis. Hycodan 8 ounces 1 teaspoon by mouth every 6 hours when necessary cough. Vicodin 5/500 (#40) 1 by mouth every 6 hours when necessary knee pain.

## 2011-11-30 NOTE — Patient Instructions (Addendum)
Followup with orthopedist regarding knee pain. Take pain medication sparingly. Take Zithromax as directed for respiratory infection and cough syrup as needed sparingly for cough.

## 2011-12-06 DIAGNOSIS — M7120 Synovial cyst of popliteal space [Baker], unspecified knee: Secondary | ICD-10-CM | POA: Diagnosis not present

## 2012-01-10 DIAGNOSIS — I10 Essential (primary) hypertension: Secondary | ICD-10-CM | POA: Insufficient documentation

## 2012-01-18 ENCOUNTER — Ambulatory Visit (INDEPENDENT_AMBULATORY_CARE_PROVIDER_SITE_OTHER): Payer: PRIVATE HEALTH INSURANCE | Admitting: Obstetrics and Gynecology

## 2012-01-18 ENCOUNTER — Encounter: Payer: Self-pay | Admitting: Obstetrics and Gynecology

## 2012-01-18 ENCOUNTER — Other Ambulatory Visit (HOSPITAL_COMMUNITY)
Admission: RE | Admit: 2012-01-18 | Discharge: 2012-01-18 | Disposition: A | Discharge status: Home / Self Care | Payer: PRIVATE HEALTH INSURANCE | Source: Ambulatory Visit | Attending: Obstetrics and Gynecology | Admitting: Obstetrics and Gynecology

## 2012-01-18 VITALS — BP 134/84 | Ht 63.0 in | Wt 237.0 lb

## 2012-01-18 DIAGNOSIS — Z01419 Encounter for gynecological examination (general) (routine) without abnormal findings: Secondary | ICD-10-CM | POA: Diagnosis not present

## 2012-01-18 DIAGNOSIS — Z124 Encounter for screening for malignant neoplasm of cervix: Secondary | ICD-10-CM | POA: Insufficient documentation

## 2012-01-18 NOTE — Progress Notes (Signed)
Patient came to see me today for her annual GYN exam. She has had knee replacement surgery since her last seen her. We are watching her with a small cystocele. She does get frequent UTIs and we've had her evaluated by the urologist. No anatomical abnormalities were found. She does have nocturia 4-5 times a night but is because of a lot of drinking fluids. We have made her aware of medication but she does not want to take anything. She does not have urinary stress incontinence or urgency. She is having no vaginal bleeding. She is having no pelvic pain. She is up-to-date on mammograms. She does her lab through her PCP. She has had 2 normal bone densities in  our office. She has used estrogen cream for atrophic vaginitis but is not currently using it.  Physical examination:  Selena Batten gardner present. HEENT within normal limits. Neck: Thyroid not large. No masses. Supraclavicular nodes: not enlarged. Breasts: Examined in both sitting midline position. No skin changes and no masses. Abdomen: Soft no guarding rebound or masses or hernia. Pelvic: External: Within normal limits. BUS: Within normal limits. Vaginal:within normal limits. Good estrogen effect. 1-1/2 cystocele. Cervix: clean. Uterus: Normal size and shape. Adnexa: No masses. Rectovaginal exam: Confirmatory and negative. Extremities: Within normal limits.  Assessment: #1. Cystocele #2. UTIs #3. Atrophic vaginitis #4. Nocturia  Plan: Discussed all of the above in detail. For the moment just observation.

## 2012-01-30 ENCOUNTER — Other Ambulatory Visit: Payer: Self-pay | Admitting: Orthopedic Surgery

## 2012-01-30 DIAGNOSIS — M79651 Pain in right thigh: Secondary | ICD-10-CM

## 2012-01-30 DIAGNOSIS — M25559 Pain in unspecified hip: Secondary | ICD-10-CM | POA: Diagnosis not present

## 2012-01-31 ENCOUNTER — Other Ambulatory Visit: Payer: Self-pay | Admitting: Internal Medicine

## 2012-01-31 ENCOUNTER — Other Ambulatory Visit: Payer: PRIVATE HEALTH INSURANCE | Admitting: Internal Medicine

## 2012-01-31 DIAGNOSIS — M7120 Synovial cyst of popliteal space [Baker], unspecified knee: Secondary | ICD-10-CM | POA: Diagnosis not present

## 2012-01-31 LAB — CREATININE, SERUM: Creat: 0.75 mg/dL (ref 0.50–1.10)

## 2012-01-31 NOTE — Progress Notes (Signed)
  Subjective:    Patient ID: Tara [name], female    DOB: [birth year], 76 y.o.   MRN: [id number]  HPI patient sent by Dr. Thurston Hole for lab work. Apparently has mass in right lateral leg that is painful. He plans to do MRI in the near future. Patient not seen here by physician today. Just came for lab work and relayed this history to [place].    Review of Systems     Objective:   Physical Exam        Assessment & Plan:

## 2012-02-02 LAB — CREATININE WITH EST GFR
Creat: 0.75 mg/dL (ref 0.50–1.10)
GFR, Est African American: >89 mL/min
GFR, Est Non African American: 79 mL/min

## 2012-02-02 LAB — BUN: BUN: 13 mg/dL (ref 6–23)

## 2012-02-07 ENCOUNTER — Ambulatory Visit
Admission: RE | Admit: 2012-02-07 | Discharge: 2012-02-07 | Disposition: A | Discharge status: Home / Self Care | Payer: PRIVATE HEALTH INSURANCE | Source: Ambulatory Visit | Attending: Orthopedic Surgery | Admitting: Orthopedic Surgery

## 2012-02-07 DIAGNOSIS — M79609 Pain in unspecified limb: Secondary | ICD-10-CM | POA: Diagnosis not present

## 2012-02-07 DIAGNOSIS — M169 Osteoarthritis of hip, unspecified: Secondary | ICD-10-CM | POA: Diagnosis not present

## 2012-02-07 DIAGNOSIS — M629 Disorder of muscle, unspecified: Secondary | ICD-10-CM | POA: Diagnosis not present

## 2012-02-07 DIAGNOSIS — M79651 Pain in right thigh: Secondary | ICD-10-CM

## 2012-02-07 MED ORDER — GADOBENATE DIMEGLUMINE 529 MG/ML IV SOLN
20.0000 mL | Freq: Once | INTRAVENOUS | Status: AC | PRN
  Administered 2012-02-07: 20 mL via INTRAVENOUS

## 2012-02-15 DIAGNOSIS — M5430 Sciatica, unspecified side: Secondary | ICD-10-CM | POA: Diagnosis not present

## 2012-02-16 ENCOUNTER — Telehealth: Payer: Self-pay | Admitting: Internal Medicine

## 2012-02-16 NOTE — Telephone Encounter (Signed)
Patient has been seeing orthopedist regarding pain in upper thigh. Recent MRI shows this to be a fatty infiltration with no suspicious tumor detected. Patient just started a six-day Sterapred DS dosepak and has 4 days left. She spoke with PA in orthopedist office today who is going to call in Voltaren gel. Patient is frustrated with the pain. Says it is better when she sits and worse with standing. She  thought about seeing chiropractor or acupuncturist. I do not see any harm in either of these since MRI shows this likely to be likely a  lesion. The pain extends into her groin. She spoke with sports medicine physician at the orthopedic office yesterday who suggested perhaps she might need an MRI of her LS-spine. One might also consider pain being caused possibly by a hip problem. Patient was encouraged to followup with PA for Dr. Thurston Hole.

## 2012-03-08 DIAGNOSIS — M62838 Other muscle spasm: Secondary | ICD-10-CM | POA: Diagnosis not present

## 2012-03-08 DIAGNOSIS — M5137 Other intervertebral disc degeneration, lumbosacral region: Secondary | ICD-10-CM | POA: Diagnosis not present

## 2012-03-08 DIAGNOSIS — IMO0002 Reserved for concepts with insufficient information to code with codable children: Secondary | ICD-10-CM | POA: Diagnosis not present

## 2012-03-08 DIAGNOSIS — M5030 Other cervical disc degeneration, unspecified cervical region: Secondary | ICD-10-CM | POA: Diagnosis not present

## 2012-03-08 DIAGNOSIS — M999 Biomechanical lesion, unspecified: Secondary | ICD-10-CM | POA: Diagnosis not present

## 2012-03-08 DIAGNOSIS — M9981 Other biomechanical lesions of cervical region: Secondary | ICD-10-CM | POA: Diagnosis not present

## 2012-03-09 DIAGNOSIS — M5137 Other intervertebral disc degeneration, lumbosacral region: Secondary | ICD-10-CM | POA: Diagnosis not present

## 2012-03-09 DIAGNOSIS — M9981 Other biomechanical lesions of cervical region: Secondary | ICD-10-CM | POA: Diagnosis not present

## 2012-03-09 DIAGNOSIS — M5030 Other cervical disc degeneration, unspecified cervical region: Secondary | ICD-10-CM | POA: Diagnosis not present

## 2012-03-09 DIAGNOSIS — IMO0002 Reserved for concepts with insufficient information to code with codable children: Secondary | ICD-10-CM | POA: Diagnosis not present

## 2012-03-09 DIAGNOSIS — M999 Biomechanical lesion, unspecified: Secondary | ICD-10-CM | POA: Diagnosis not present

## 2012-03-09 DIAGNOSIS — M62838 Other muscle spasm: Secondary | ICD-10-CM | POA: Diagnosis not present

## 2012-03-12 DIAGNOSIS — M999 Biomechanical lesion, unspecified: Secondary | ICD-10-CM | POA: Diagnosis not present

## 2012-03-12 DIAGNOSIS — M5030 Other cervical disc degeneration, unspecified cervical region: Secondary | ICD-10-CM | POA: Diagnosis not present

## 2012-03-12 DIAGNOSIS — IMO0002 Reserved for concepts with insufficient information to code with codable children: Secondary | ICD-10-CM | POA: Diagnosis not present

## 2012-03-12 DIAGNOSIS — M5137 Other intervertebral disc degeneration, lumbosacral region: Secondary | ICD-10-CM | POA: Diagnosis not present

## 2012-03-12 DIAGNOSIS — M9981 Other biomechanical lesions of cervical region: Secondary | ICD-10-CM | POA: Diagnosis not present

## 2012-03-12 DIAGNOSIS — M62838 Other muscle spasm: Secondary | ICD-10-CM | POA: Diagnosis not present

## 2012-03-14 DIAGNOSIS — IMO0002 Reserved for concepts with insufficient information to code with codable children: Secondary | ICD-10-CM | POA: Diagnosis not present

## 2012-03-14 DIAGNOSIS — M62838 Other muscle spasm: Secondary | ICD-10-CM | POA: Diagnosis not present

## 2012-03-14 DIAGNOSIS — M5137 Other intervertebral disc degeneration, lumbosacral region: Secondary | ICD-10-CM | POA: Diagnosis not present

## 2012-03-14 DIAGNOSIS — M9981 Other biomechanical lesions of cervical region: Secondary | ICD-10-CM | POA: Diagnosis not present

## 2012-03-14 DIAGNOSIS — M5030 Other cervical disc degeneration, unspecified cervical region: Secondary | ICD-10-CM | POA: Diagnosis not present

## 2012-03-14 DIAGNOSIS — M999 Biomechanical lesion, unspecified: Secondary | ICD-10-CM | POA: Diagnosis not present

## 2012-03-16 DIAGNOSIS — M62838 Other muscle spasm: Secondary | ICD-10-CM | POA: Diagnosis not present

## 2012-03-16 DIAGNOSIS — M5030 Other cervical disc degeneration, unspecified cervical region: Secondary | ICD-10-CM | POA: Diagnosis not present

## 2012-03-16 DIAGNOSIS — IMO0002 Reserved for concepts with insufficient information to code with codable children: Secondary | ICD-10-CM | POA: Diagnosis not present

## 2012-03-16 DIAGNOSIS — M5137 Other intervertebral disc degeneration, lumbosacral region: Secondary | ICD-10-CM | POA: Diagnosis not present

## 2012-03-16 DIAGNOSIS — M9981 Other biomechanical lesions of cervical region: Secondary | ICD-10-CM | POA: Diagnosis not present

## 2012-03-16 DIAGNOSIS — M999 Biomechanical lesion, unspecified: Secondary | ICD-10-CM | POA: Diagnosis not present

## 2012-03-19 DIAGNOSIS — M9981 Other biomechanical lesions of cervical region: Secondary | ICD-10-CM | POA: Diagnosis not present

## 2012-03-19 DIAGNOSIS — M5137 Other intervertebral disc degeneration, lumbosacral region: Secondary | ICD-10-CM | POA: Diagnosis not present

## 2012-03-19 DIAGNOSIS — IMO0002 Reserved for concepts with insufficient information to code with codable children: Secondary | ICD-10-CM | POA: Diagnosis not present

## 2012-03-19 DIAGNOSIS — M5030 Other cervical disc degeneration, unspecified cervical region: Secondary | ICD-10-CM | POA: Diagnosis not present

## 2012-03-19 DIAGNOSIS — M999 Biomechanical lesion, unspecified: Secondary | ICD-10-CM | POA: Diagnosis not present

## 2012-03-19 DIAGNOSIS — M62838 Other muscle spasm: Secondary | ICD-10-CM | POA: Diagnosis not present

## 2012-03-21 DIAGNOSIS — M5030 Other cervical disc degeneration, unspecified cervical region: Secondary | ICD-10-CM | POA: Diagnosis not present

## 2012-03-21 DIAGNOSIS — M9981 Other biomechanical lesions of cervical region: Secondary | ICD-10-CM | POA: Diagnosis not present

## 2012-03-21 DIAGNOSIS — M62838 Other muscle spasm: Secondary | ICD-10-CM | POA: Diagnosis not present

## 2012-03-21 DIAGNOSIS — IMO0002 Reserved for concepts with insufficient information to code with codable children: Secondary | ICD-10-CM | POA: Diagnosis not present

## 2012-03-21 DIAGNOSIS — M999 Biomechanical lesion, unspecified: Secondary | ICD-10-CM | POA: Diagnosis not present

## 2012-03-21 DIAGNOSIS — M5137 Other intervertebral disc degeneration, lumbosacral region: Secondary | ICD-10-CM | POA: Diagnosis not present

## 2012-03-23 DIAGNOSIS — M62838 Other muscle spasm: Secondary | ICD-10-CM | POA: Diagnosis not present

## 2012-03-23 DIAGNOSIS — IMO0002 Reserved for concepts with insufficient information to code with codable children: Secondary | ICD-10-CM | POA: Diagnosis not present

## 2012-03-23 DIAGNOSIS — M9981 Other biomechanical lesions of cervical region: Secondary | ICD-10-CM | POA: Diagnosis not present

## 2012-03-23 DIAGNOSIS — M5137 Other intervertebral disc degeneration, lumbosacral region: Secondary | ICD-10-CM | POA: Diagnosis not present

## 2012-03-23 DIAGNOSIS — M5030 Other cervical disc degeneration, unspecified cervical region: Secondary | ICD-10-CM | POA: Diagnosis not present

## 2012-03-23 DIAGNOSIS — M999 Biomechanical lesion, unspecified: Secondary | ICD-10-CM | POA: Diagnosis not present

## 2012-03-26 DIAGNOSIS — M62838 Other muscle spasm: Secondary | ICD-10-CM | POA: Diagnosis not present

## 2012-03-26 DIAGNOSIS — M5137 Other intervertebral disc degeneration, lumbosacral region: Secondary | ICD-10-CM | POA: Diagnosis not present

## 2012-03-26 DIAGNOSIS — M5030 Other cervical disc degeneration, unspecified cervical region: Secondary | ICD-10-CM | POA: Diagnosis not present

## 2012-03-26 DIAGNOSIS — M9981 Other biomechanical lesions of cervical region: Secondary | ICD-10-CM | POA: Diagnosis not present

## 2012-03-26 DIAGNOSIS — IMO0002 Reserved for concepts with insufficient information to code with codable children: Secondary | ICD-10-CM | POA: Diagnosis not present

## 2012-03-26 DIAGNOSIS — M999 Biomechanical lesion, unspecified: Secondary | ICD-10-CM | POA: Diagnosis not present

## 2012-03-28 DIAGNOSIS — IMO0002 Reserved for concepts with insufficient information to code with codable children: Secondary | ICD-10-CM | POA: Diagnosis not present

## 2012-03-28 DIAGNOSIS — M9981 Other biomechanical lesions of cervical region: Secondary | ICD-10-CM | POA: Diagnosis not present

## 2012-03-28 DIAGNOSIS — M999 Biomechanical lesion, unspecified: Secondary | ICD-10-CM | POA: Diagnosis not present

## 2012-03-28 DIAGNOSIS — M62838 Other muscle spasm: Secondary | ICD-10-CM | POA: Diagnosis not present

## 2012-03-28 DIAGNOSIS — M5137 Other intervertebral disc degeneration, lumbosacral region: Secondary | ICD-10-CM | POA: Diagnosis not present

## 2012-03-28 DIAGNOSIS — M5030 Other cervical disc degeneration, unspecified cervical region: Secondary | ICD-10-CM | POA: Diagnosis not present

## 2012-03-30 DIAGNOSIS — M62838 Other muscle spasm: Secondary | ICD-10-CM | POA: Diagnosis not present

## 2012-03-30 DIAGNOSIS — M5137 Other intervertebral disc degeneration, lumbosacral region: Secondary | ICD-10-CM | POA: Diagnosis not present

## 2012-03-30 DIAGNOSIS — IMO0002 Reserved for concepts with insufficient information to code with codable children: Secondary | ICD-10-CM | POA: Diagnosis not present

## 2012-03-30 DIAGNOSIS — M5030 Other cervical disc degeneration, unspecified cervical region: Secondary | ICD-10-CM | POA: Diagnosis not present

## 2012-03-30 DIAGNOSIS — M999 Biomechanical lesion, unspecified: Secondary | ICD-10-CM | POA: Diagnosis not present

## 2012-03-30 DIAGNOSIS — M9981 Other biomechanical lesions of cervical region: Secondary | ICD-10-CM | POA: Diagnosis not present

## 2012-04-02 DIAGNOSIS — M62838 Other muscle spasm: Secondary | ICD-10-CM | POA: Diagnosis not present

## 2012-04-02 DIAGNOSIS — M5030 Other cervical disc degeneration, unspecified cervical region: Secondary | ICD-10-CM | POA: Diagnosis not present

## 2012-04-02 DIAGNOSIS — M9981 Other biomechanical lesions of cervical region: Secondary | ICD-10-CM | POA: Diagnosis not present

## 2012-04-02 DIAGNOSIS — M5137 Other intervertebral disc degeneration, lumbosacral region: Secondary | ICD-10-CM | POA: Diagnosis not present

## 2012-04-02 DIAGNOSIS — M999 Biomechanical lesion, unspecified: Secondary | ICD-10-CM | POA: Diagnosis not present

## 2012-04-02 DIAGNOSIS — IMO0002 Reserved for concepts with insufficient information to code with codable children: Secondary | ICD-10-CM | POA: Diagnosis not present

## 2012-04-03 ENCOUNTER — Telehealth: Payer: Self-pay | Admitting: Internal Medicine

## 2012-04-03 NOTE — Telephone Encounter (Signed)
Nothing was found on MRI in that area. We can see her next week, examine and discuss.

## 2012-04-04 DIAGNOSIS — M9981 Other biomechanical lesions of cervical region: Secondary | ICD-10-CM | POA: Diagnosis not present

## 2012-04-04 DIAGNOSIS — M5137 Other intervertebral disc degeneration, lumbosacral region: Secondary | ICD-10-CM | POA: Diagnosis not present

## 2012-04-04 DIAGNOSIS — M62838 Other muscle spasm: Secondary | ICD-10-CM | POA: Diagnosis not present

## 2012-04-04 DIAGNOSIS — IMO0002 Reserved for concepts with insufficient information to code with codable children: Secondary | ICD-10-CM | POA: Diagnosis not present

## 2012-04-04 DIAGNOSIS — M999 Biomechanical lesion, unspecified: Secondary | ICD-10-CM | POA: Diagnosis not present

## 2012-04-04 DIAGNOSIS — M5030 Other cervical disc degeneration, unspecified cervical region: Secondary | ICD-10-CM | POA: Diagnosis not present

## 2012-04-06 DIAGNOSIS — M5030 Other cervical disc degeneration, unspecified cervical region: Secondary | ICD-10-CM | POA: Diagnosis not present

## 2012-04-06 DIAGNOSIS — M9981 Other biomechanical lesions of cervical region: Secondary | ICD-10-CM | POA: Diagnosis not present

## 2012-04-06 DIAGNOSIS — M5137 Other intervertebral disc degeneration, lumbosacral region: Secondary | ICD-10-CM | POA: Diagnosis not present

## 2012-04-06 DIAGNOSIS — M62838 Other muscle spasm: Secondary | ICD-10-CM | POA: Diagnosis not present

## 2012-04-06 DIAGNOSIS — IMO0002 Reserved for concepts with insufficient information to code with codable children: Secondary | ICD-10-CM | POA: Diagnosis not present

## 2012-04-06 DIAGNOSIS — M999 Biomechanical lesion, unspecified: Secondary | ICD-10-CM | POA: Diagnosis not present

## 2012-04-09 DIAGNOSIS — M25559 Pain in unspecified hip: Secondary | ICD-10-CM | POA: Diagnosis not present

## 2012-04-10 DIAGNOSIS — M9981 Other biomechanical lesions of cervical region: Secondary | ICD-10-CM | POA: Diagnosis not present

## 2012-04-10 DIAGNOSIS — M999 Biomechanical lesion, unspecified: Secondary | ICD-10-CM | POA: Diagnosis not present

## 2012-04-10 DIAGNOSIS — IMO0002 Reserved for concepts with insufficient information to code with codable children: Secondary | ICD-10-CM | POA: Diagnosis not present

## 2012-04-10 DIAGNOSIS — M62838 Other muscle spasm: Secondary | ICD-10-CM | POA: Diagnosis not present

## 2012-04-10 DIAGNOSIS — M5030 Other cervical disc degeneration, unspecified cervical region: Secondary | ICD-10-CM | POA: Diagnosis not present

## 2012-04-10 DIAGNOSIS — M5137 Other intervertebral disc degeneration, lumbosacral region: Secondary | ICD-10-CM | POA: Diagnosis not present

## 2012-04-10 NOTE — Telephone Encounter (Signed)
LMOM for pt @ home # to return my call to make appt.

## 2012-04-12 DIAGNOSIS — M5030 Other cervical disc degeneration, unspecified cervical region: Secondary | ICD-10-CM | POA: Diagnosis not present

## 2012-04-12 DIAGNOSIS — M62838 Other muscle spasm: Secondary | ICD-10-CM | POA: Diagnosis not present

## 2012-04-12 DIAGNOSIS — M999 Biomechanical lesion, unspecified: Secondary | ICD-10-CM | POA: Diagnosis not present

## 2012-04-12 DIAGNOSIS — M9981 Other biomechanical lesions of cervical region: Secondary | ICD-10-CM | POA: Diagnosis not present

## 2012-04-12 DIAGNOSIS — M5137 Other intervertebral disc degeneration, lumbosacral region: Secondary | ICD-10-CM | POA: Diagnosis not present

## 2012-04-12 DIAGNOSIS — IMO0002 Reserved for concepts with insufficient information to code with codable children: Secondary | ICD-10-CM | POA: Diagnosis not present

## 2012-04-17 DIAGNOSIS — M5137 Other intervertebral disc degeneration, lumbosacral region: Secondary | ICD-10-CM | POA: Diagnosis not present

## 2012-04-17 DIAGNOSIS — M999 Biomechanical lesion, unspecified: Secondary | ICD-10-CM | POA: Diagnosis not present

## 2012-04-17 DIAGNOSIS — IMO0002 Reserved for concepts with insufficient information to code with codable children: Secondary | ICD-10-CM | POA: Diagnosis not present

## 2012-04-17 DIAGNOSIS — M62838 Other muscle spasm: Secondary | ICD-10-CM | POA: Diagnosis not present

## 2012-04-17 DIAGNOSIS — M5030 Other cervical disc degeneration, unspecified cervical region: Secondary | ICD-10-CM | POA: Diagnosis not present

## 2012-04-17 DIAGNOSIS — M9981 Other biomechanical lesions of cervical region: Secondary | ICD-10-CM | POA: Diagnosis not present

## 2012-04-19 DIAGNOSIS — IMO0002 Reserved for concepts with insufficient information to code with codable children: Secondary | ICD-10-CM | POA: Diagnosis not present

## 2012-04-19 DIAGNOSIS — M62838 Other muscle spasm: Secondary | ICD-10-CM | POA: Diagnosis not present

## 2012-04-19 DIAGNOSIS — M999 Biomechanical lesion, unspecified: Secondary | ICD-10-CM | POA: Diagnosis not present

## 2012-04-19 DIAGNOSIS — M9981 Other biomechanical lesions of cervical region: Secondary | ICD-10-CM | POA: Diagnosis not present

## 2012-04-19 DIAGNOSIS — M5137 Other intervertebral disc degeneration, lumbosacral region: Secondary | ICD-10-CM | POA: Diagnosis not present

## 2012-04-19 DIAGNOSIS — M5030 Other cervical disc degeneration, unspecified cervical region: Secondary | ICD-10-CM | POA: Diagnosis not present

## 2012-04-24 DIAGNOSIS — M545 Low back pain: Secondary | ICD-10-CM | POA: Diagnosis not present

## 2012-04-24 DIAGNOSIS — IMO0002 Reserved for concepts with insufficient information to code with codable children: Secondary | ICD-10-CM | POA: Diagnosis not present

## 2012-04-24 DIAGNOSIS — M62838 Other muscle spasm: Secondary | ICD-10-CM | POA: Diagnosis not present

## 2012-04-24 DIAGNOSIS — M47817 Spondylosis without myelopathy or radiculopathy, lumbosacral region: Secondary | ICD-10-CM | POA: Diagnosis not present

## 2012-04-24 DIAGNOSIS — M25559 Pain in unspecified hip: Secondary | ICD-10-CM | POA: Diagnosis not present

## 2012-04-24 DIAGNOSIS — M5030 Other cervical disc degeneration, unspecified cervical region: Secondary | ICD-10-CM | POA: Diagnosis not present

## 2012-04-24 DIAGNOSIS — M9981 Other biomechanical lesions of cervical region: Secondary | ICD-10-CM | POA: Diagnosis not present

## 2012-04-24 DIAGNOSIS — M5137 Other intervertebral disc degeneration, lumbosacral region: Secondary | ICD-10-CM | POA: Diagnosis not present

## 2012-04-24 DIAGNOSIS — M1610 Unilateral primary osteoarthritis, unspecified hip: Secondary | ICD-10-CM | POA: Diagnosis not present

## 2012-04-24 DIAGNOSIS — M999 Biomechanical lesion, unspecified: Secondary | ICD-10-CM | POA: Diagnosis not present

## 2012-04-26 DIAGNOSIS — M9981 Other biomechanical lesions of cervical region: Secondary | ICD-10-CM | POA: Diagnosis not present

## 2012-04-26 DIAGNOSIS — M999 Biomechanical lesion, unspecified: Secondary | ICD-10-CM | POA: Diagnosis not present

## 2012-04-26 DIAGNOSIS — IMO0002 Reserved for concepts with insufficient information to code with codable children: Secondary | ICD-10-CM | POA: Diagnosis not present

## 2012-04-26 DIAGNOSIS — M62838 Other muscle spasm: Secondary | ICD-10-CM | POA: Diagnosis not present

## 2012-04-26 DIAGNOSIS — M5137 Other intervertebral disc degeneration, lumbosacral region: Secondary | ICD-10-CM | POA: Diagnosis not present

## 2012-04-26 DIAGNOSIS — M5030 Other cervical disc degeneration, unspecified cervical region: Secondary | ICD-10-CM | POA: Diagnosis not present

## 2012-05-02 ENCOUNTER — Other Ambulatory Visit: Payer: Self-pay | Admitting: Internal Medicine

## 2012-05-15 DIAGNOSIS — M25559 Pain in unspecified hip: Secondary | ICD-10-CM | POA: Diagnosis not present

## 2012-05-15 DIAGNOSIS — M76899 Other specified enthesopathies of unspecified lower limb, excluding foot: Secondary | ICD-10-CM | POA: Diagnosis not present

## 2012-06-05 DIAGNOSIS — M545 Low back pain: Secondary | ICD-10-CM | POA: Diagnosis not present

## 2012-06-18 ENCOUNTER — Other Ambulatory Visit: Payer: Self-pay | Admitting: Internal Medicine

## 2012-06-26 DIAGNOSIS — M545 Low back pain: Secondary | ICD-10-CM | POA: Diagnosis not present

## 2012-06-26 DIAGNOSIS — M5430 Sciatica, unspecified side: Secondary | ICD-10-CM | POA: Diagnosis not present

## 2012-06-29 DIAGNOSIS — M5126 Other intervertebral disc displacement, lumbar region: Secondary | ICD-10-CM | POA: Diagnosis not present

## 2012-06-29 DIAGNOSIS — M48061 Spinal stenosis, lumbar region without neurogenic claudication: Secondary | ICD-10-CM | POA: Diagnosis not present

## 2012-06-29 DIAGNOSIS — M545 Low back pain: Secondary | ICD-10-CM | POA: Diagnosis not present

## 2012-06-29 DIAGNOSIS — M5137 Other intervertebral disc degeneration, lumbosacral region: Secondary | ICD-10-CM | POA: Diagnosis not present

## 2012-07-18 DIAGNOSIS — M5137 Other intervertebral disc degeneration, lumbosacral region: Secondary | ICD-10-CM | POA: Diagnosis not present

## 2012-07-18 DIAGNOSIS — M47817 Spondylosis without myelopathy or radiculopathy, lumbosacral region: Secondary | ICD-10-CM | POA: Diagnosis not present

## 2012-07-18 DIAGNOSIS — IMO0002 Reserved for concepts with insufficient information to code with codable children: Secondary | ICD-10-CM | POA: Diagnosis not present

## 2012-07-18 DIAGNOSIS — M25559 Pain in unspecified hip: Secondary | ICD-10-CM | POA: Diagnosis not present

## 2012-08-08 DIAGNOSIS — M5137 Other intervertebral disc degeneration, lumbosacral region: Secondary | ICD-10-CM | POA: Diagnosis not present

## 2012-08-08 DIAGNOSIS — M1610 Unilateral primary osteoarthritis, unspecified hip: Secondary | ICD-10-CM | POA: Diagnosis not present

## 2012-08-08 DIAGNOSIS — IMO0002 Reserved for concepts with insufficient information to code with codable children: Secondary | ICD-10-CM | POA: Diagnosis not present

## 2012-08-08 DIAGNOSIS — M47817 Spondylosis without myelopathy or radiculopathy, lumbosacral region: Secondary | ICD-10-CM | POA: Diagnosis not present

## 2012-08-16 DIAGNOSIS — M5137 Other intervertebral disc degeneration, lumbosacral region: Secondary | ICD-10-CM | POA: Diagnosis not present

## 2012-08-16 DIAGNOSIS — M1610 Unilateral primary osteoarthritis, unspecified hip: Secondary | ICD-10-CM | POA: Diagnosis not present

## 2012-08-16 DIAGNOSIS — M48061 Spinal stenosis, lumbar region without neurogenic claudication: Secondary | ICD-10-CM | POA: Diagnosis not present

## 2012-08-16 DIAGNOSIS — M47817 Spondylosis without myelopathy or radiculopathy, lumbosacral region: Secondary | ICD-10-CM | POA: Diagnosis not present

## 2012-09-11 ENCOUNTER — Telehealth: Payer: Self-pay | Admitting: Internal Medicine

## 2012-09-11 MED ORDER — LEVOTHYROXINE SODIUM 75 MCG PO TABS: 75.0000 ug | ORAL_TABLET | Freq: Every day | ORAL | Status: DC

## 2012-09-11 NOTE — Telephone Encounter (Signed)
Patient has an appointment 10/04/2012

## 2012-09-11 NOTE — Telephone Encounter (Signed)
Please change to generic at pt request. When is appt due?

## 2012-09-13 DIAGNOSIS — M47817 Spondylosis without myelopathy or radiculopathy, lumbosacral region: Secondary | ICD-10-CM | POA: Diagnosis not present

## 2012-09-13 DIAGNOSIS — M48061 Spinal stenosis, lumbar region without neurogenic claudication: Secondary | ICD-10-CM | POA: Diagnosis not present

## 2012-09-13 DIAGNOSIS — IMO0002 Reserved for concepts with insufficient information to code with codable children: Secondary | ICD-10-CM | POA: Diagnosis not present

## 2012-09-13 DIAGNOSIS — M169 Osteoarthritis of hip, unspecified: Secondary | ICD-10-CM | POA: Diagnosis not present

## 2012-10-04 ENCOUNTER — Ambulatory Visit (INDEPENDENT_AMBULATORY_CARE_PROVIDER_SITE_OTHER): Payer: Medicare Other | Admitting: Internal Medicine

## 2012-10-04 ENCOUNTER — Encounter: Payer: Self-pay | Admitting: Internal Medicine

## 2012-10-04 VITALS — BP 160/86 | HR 80 | Temp 98.4°F | Wt 243.0 lb

## 2012-10-04 DIAGNOSIS — M25551 Pain in right hip: Secondary | ICD-10-CM

## 2012-10-04 DIAGNOSIS — I10 Essential (primary) hypertension: Secondary | ICD-10-CM | POA: Diagnosis not present

## 2012-10-04 DIAGNOSIS — N3941 Urge incontinence: Secondary | ICD-10-CM | POA: Diagnosis not present

## 2012-10-04 DIAGNOSIS — E669 Obesity, unspecified: Secondary | ICD-10-CM | POA: Diagnosis not present

## 2012-10-04 DIAGNOSIS — Z13820 Encounter for screening for osteoporosis: Secondary | ICD-10-CM | POA: Diagnosis not present

## 2012-10-04 DIAGNOSIS — Z1231 Encounter for screening mammogram for malignant neoplasm of breast: Secondary | ICD-10-CM | POA: Diagnosis not present

## 2012-10-04 DIAGNOSIS — M25559 Pain in unspecified hip: Secondary | ICD-10-CM

## 2012-10-04 DIAGNOSIS — M48061 Spinal stenosis, lumbar region without neurogenic claudication: Secondary | ICD-10-CM

## 2012-10-04 DIAGNOSIS — E039 Hypothyroidism, unspecified: Secondary | ICD-10-CM

## 2012-10-04 LAB — TSH: TSH: 2.037 u[IU]/mL (ref 0.350–4.500)

## 2012-10-04 MED ORDER — SOLIFENACIN SUCCINATE 5 MG PO TABS: 10.0000 mg | ORAL_TABLET | Freq: Every day | ORAL | Status: DC

## 2012-10-04 NOTE — Progress Notes (Signed)
  Subjective:    Patient ID: Tara [name], female    DOB: 1935-12-13, 76 y.o.   MRN: [id number]  HPI Has had considerable problems with right hip and right leg pain. Was told it could be sciatica. Then had MRI of lumbar spine and had epidural steroid injection once which did not help. Then had second epidural injection which did not help. Cannot exercise because of pain. Had left knee replacement July 2012 and in March 2013 a knot developed near right hip. Tramadol makes her nauseated. No oral steroids since 4-5 months ago. Had a 12 day dosepack by Dr. Wyline Mood at that time. Previously had a 6 day dosepack by Dr. Thurston Hole in March 2013. Had a steroid injection in right trochanter about 6 weeks ago. Had MRI of right femur March 2013 which showed fatty infiltration of right tensor fascia lata muscle. This muscle was mildly enlarged. 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  Says she can deal with back pain. Hip pain is worse. Has not been taking Demadex because of urinary urgency. Blood pressure is not well controlled.  Patient is considering seeing a neurosurgeon in [place]. Is intolerant of many medications. Has not tried Lyrica or gabapentin.  Review of Systems     Objective:   Physical Exam Tender right greater trochanter. Pain with internal rotation of right hip. Straight leg raising bilaterally is essentially negative  Hip hurts more than back. Muscle strength in legs is OK. Has put on weight. Chest clear to auscultation. Cardiac exam regular rate and rhythm. Trace lower extremity edema.        Assessment & Plan:  Hypertension-needs to take Demadex and return in 3 weeks  Obesity  Right hip pain-will have MRI of right hip  History of spinal stenosis-patient will get MRI report for me which was done in [place].  Urinary urgency-try Vesicare 5 mg twice daily. This was E.  prescribed to [facility]. Detrol as been discontinued because it is not on her formulary.  Over 30 minutes spent with patient with these multiple medical problems. She is considering seeing neurosurgeon in [place] but I've asked her to wait on this appointment we get the results of MRI of the right hip.

## 2012-10-04 NOTE — Patient Instructions (Addendum)
Take Demadex for Blood pressure. Return in 3 weeks. Get MRI of left hip.

## 2012-10-08 ENCOUNTER — Telehealth: Payer: Self-pay

## 2012-10-08 DIAGNOSIS — M25559 Pain in unspecified hip: Secondary | ICD-10-CM

## 2012-10-08 NOTE — Telephone Encounter (Signed)
Patient scheduled for an MRI of rt hip on 10/13/2012 at 10:30am at [facility], [address]. Aware of this appointment

## 2012-10-10 ENCOUNTER — Telehealth: Payer: Self-pay

## 2012-10-10 DIAGNOSIS — M899 Disorder of bone, unspecified: Secondary | ICD-10-CM

## 2012-10-11 ENCOUNTER — Other Ambulatory Visit: Payer: BC Managed Care – PPO

## 2012-10-11 NOTE — Telephone Encounter (Signed)
Mri of rt hip and rt femur rescheduled for 10/14/2012 at 6:30pm. Patient aware.

## 2012-10-13 ENCOUNTER — Other Ambulatory Visit: Payer: BC Managed Care – PPO

## 2012-10-14 ENCOUNTER — Ambulatory Visit
Admission: RE | Admit: 2012-10-14 | Discharge: 2012-10-14 | Disposition: A | Discharge status: Home / Self Care | Payer: BC Managed Care – PPO | Source: Ambulatory Visit | Attending: Internal Medicine | Admitting: Internal Medicine

## 2012-10-14 ENCOUNTER — Ambulatory Visit
Admission: RE | Admit: 2012-10-14 | Discharge: 2012-10-14 | Disposition: A | Discharge status: Home / Self Care | Payer: Medicare Other | Source: Ambulatory Visit | Attending: Internal Medicine | Admitting: Internal Medicine

## 2012-10-14 DIAGNOSIS — D1620 Benign neoplasm of long bones of unspecified lower limb: Secondary | ICD-10-CM | POA: Diagnosis not present

## 2012-10-14 DIAGNOSIS — M76899 Other specified enthesopathies of unspecified lower limb, excluding foot: Secondary | ICD-10-CM | POA: Diagnosis not present

## 2012-10-14 DIAGNOSIS — M6688 Spontaneous rupture of other tendons, other: Secondary | ICD-10-CM | POA: Diagnosis not present

## 2012-10-14 DIAGNOSIS — M899 Disorder of bone, unspecified: Secondary | ICD-10-CM

## 2012-10-15 ENCOUNTER — Telehealth: Payer: Self-pay | Admitting: Internal Medicine

## 2012-10-15 DIAGNOSIS — IMO0002 Reserved for concepts with insufficient information to code with codable children: Secondary | ICD-10-CM | POA: Diagnosis not present

## 2012-10-15 DIAGNOSIS — M169 Osteoarthritis of hip, unspecified: Secondary | ICD-10-CM | POA: Diagnosis not present

## 2012-10-15 DIAGNOSIS — M47817 Spondylosis without myelopathy or radiculopathy, lumbosacral region: Secondary | ICD-10-CM | POA: Diagnosis not present

## 2012-10-15 DIAGNOSIS — M5137 Other intervertebral disc degeneration, lumbosacral region: Secondary | ICD-10-CM | POA: Diagnosis not present

## 2012-10-15 NOTE — Telephone Encounter (Signed)
MRI of right hip shows trochanteric bursitis and a labral tear. Benign lesion in the femur is stable. She has lumbar spondylosis. Recommend patient return to orthopedist to evaluate these issues in hip before seen neurosurgeon.

## 2012-10-19 ENCOUNTER — Other Ambulatory Visit: Payer: Self-pay | Admitting: Internal Medicine

## 2012-10-25 ENCOUNTER — Encounter: Payer: Self-pay | Admitting: Internal Medicine

## 2012-10-25 ENCOUNTER — Ambulatory Visit (INDEPENDENT_AMBULATORY_CARE_PROVIDER_SITE_OTHER): Payer: Medicare Other | Admitting: Internal Medicine

## 2012-10-25 VITALS — BP 136/76 | HR 84 | Temp 98.7°F | Wt 242.0 lb

## 2012-10-25 DIAGNOSIS — R609 Edema, unspecified: Secondary | ICD-10-CM

## 2012-10-25 DIAGNOSIS — M76899 Other specified enthesopathies of unspecified lower limb, excluding foot: Secondary | ICD-10-CM | POA: Diagnosis not present

## 2012-10-25 DIAGNOSIS — I10 Essential (primary) hypertension: Secondary | ICD-10-CM

## 2012-10-25 DIAGNOSIS — M7061 Trochanteric bursitis, right hip: Secondary | ICD-10-CM

## 2012-10-25 DIAGNOSIS — M25559 Pain in unspecified hip: Secondary | ICD-10-CM

## 2012-10-25 DIAGNOSIS — E669 Obesity, unspecified: Secondary | ICD-10-CM

## 2012-10-25 DIAGNOSIS — M545 Low back pain: Secondary | ICD-10-CM

## 2012-10-25 DIAGNOSIS — M25551 Pain in right hip: Secondary | ICD-10-CM

## 2012-10-25 LAB — BASIC METABOLIC PANEL
BUN: 13 mg/dL (ref 6–23)
CO2: 25 mEq/L (ref 19–32)
Calcium: 9.5 mg/dL (ref 8.4–10.5)
Chloride: 109 mEq/L (ref 96–112)
Creat: 0.8 mg/dL (ref 0.50–1.10)
Glucose, Bld: 89 mg/dL (ref 70–99)
Potassium: 4.4 mEq/L (ref 3.5–5.3)
Sodium: 136 mEq/L (ref 135–145)

## 2012-10-27 DIAGNOSIS — M25551 Pain in right hip: Secondary | ICD-10-CM | POA: Insufficient documentation

## 2012-10-27 DIAGNOSIS — M545 Low back pain: Secondary | ICD-10-CM | POA: Insufficient documentation

## 2012-10-27 NOTE — Progress Notes (Signed)
  Subjective:    Patient ID: Tara [name], female    DOB: Apr 19, 1936, 76 y.o.   MRN: [id number]  HPI  76 year old white female in today to followup on hypertension. Blood pressure is better today. Since last visit she had an MRI of her hip showed a small labral tear and stability of apparent benign lesion in her right femur. Also has trochanteric bursitis. She has sent these results to orthopedist in [facility] and has started physical therapy. Says her back feels better. Blood pressure has improved. She seems less anxious now that she has an answer to her hip complaints.  Patient says today that she has been taking Demadex all at one time until recently when she began to split the dose up twice daily    Review of Systems     Objective:   Physical Exam skin is warm and dry, neck is supple without thyromegaly or carotid bruits; chest clear consultation; cardiac exam regular rate and rhythm normal S1 and S2; extremities trace lower extremity edema bilaterally. Affect is brighter than at last visit          Assessment & Plan:  Hypertension  Obesity  Dependent edema  Labral tear right hip  Low back pain  Right trochanteric bursitis  Plan: Patient is to return in 6 months and continue physical therapy for back and hip pain. She will follow up with orthopedist in [facility] for these issues.  Return in 6 months for physical examination. Continue same medications.  Spent 25 minutes speaking with patient about these results and going over everything with her today. She needs a lot of reassurance.

## 2012-10-27 NOTE — Patient Instructions (Addendum)
Continue same medications and return in 6 months. Followup with orthopedist in [facility] regarding right hip issue

## 2012-10-31 DIAGNOSIS — IMO0002 Reserved for concepts with insufficient information to code with codable children: Secondary | ICD-10-CM | POA: Diagnosis not present

## 2012-11-02 DIAGNOSIS — IMO0002 Reserved for concepts with insufficient information to code with codable children: Secondary | ICD-10-CM | POA: Diagnosis not present

## 2012-11-05 DIAGNOSIS — M48061 Spinal stenosis, lumbar region without neurogenic claudication: Secondary | ICD-10-CM | POA: Diagnosis not present

## 2012-11-05 DIAGNOSIS — IMO0002 Reserved for concepts with insufficient information to code with codable children: Secondary | ICD-10-CM | POA: Diagnosis not present

## 2012-11-05 DIAGNOSIS — M47817 Spondylosis without myelopathy or radiculopathy, lumbosacral region: Secondary | ICD-10-CM | POA: Diagnosis not present

## 2012-11-05 DIAGNOSIS — M5137 Other intervertebral disc degeneration, lumbosacral region: Secondary | ICD-10-CM | POA: Diagnosis not present

## 2012-11-07 DIAGNOSIS — IMO0002 Reserved for concepts with insufficient information to code with codable children: Secondary | ICD-10-CM | POA: Diagnosis not present

## 2012-11-09 DIAGNOSIS — IMO0002 Reserved for concepts with insufficient information to code with codable children: Secondary | ICD-10-CM | POA: Diagnosis not present

## 2012-11-13 DIAGNOSIS — IMO0002 Reserved for concepts with insufficient information to code with codable children: Secondary | ICD-10-CM | POA: Diagnosis not present

## 2012-11-20 DIAGNOSIS — IMO0002 Reserved for concepts with insufficient information to code with codable children: Secondary | ICD-10-CM | POA: Diagnosis not present

## 2012-11-27 DIAGNOSIS — IMO0002 Reserved for concepts with insufficient information to code with codable children: Secondary | ICD-10-CM | POA: Diagnosis not present

## 2012-12-03 DIAGNOSIS — IMO0002 Reserved for concepts with insufficient information to code with codable children: Secondary | ICD-10-CM | POA: Diagnosis not present

## 2012-12-03 DIAGNOSIS — M47817 Spondylosis without myelopathy or radiculopathy, lumbosacral region: Secondary | ICD-10-CM | POA: Diagnosis not present

## 2012-12-03 DIAGNOSIS — M5137 Other intervertebral disc degeneration, lumbosacral region: Secondary | ICD-10-CM | POA: Diagnosis not present

## 2012-12-03 DIAGNOSIS — M48061 Spinal stenosis, lumbar region without neurogenic claudication: Secondary | ICD-10-CM | POA: Diagnosis not present

## 2012-12-20 DIAGNOSIS — M5137 Other intervertebral disc degeneration, lumbosacral region: Secondary | ICD-10-CM | POA: Diagnosis not present

## 2012-12-20 DIAGNOSIS — M48061 Spinal stenosis, lumbar region without neurogenic claudication: Secondary | ICD-10-CM | POA: Diagnosis not present

## 2012-12-20 DIAGNOSIS — IMO0002 Reserved for concepts with insufficient information to code with codable children: Secondary | ICD-10-CM | POA: Diagnosis not present

## 2012-12-27 DIAGNOSIS — I872 Venous insufficiency (chronic) (peripheral): Secondary | ICD-10-CM | POA: Diagnosis not present

## 2012-12-27 DIAGNOSIS — R609 Edema, unspecified: Secondary | ICD-10-CM | POA: Diagnosis not present

## 2012-12-27 DIAGNOSIS — I10 Essential (primary) hypertension: Secondary | ICD-10-CM | POA: Diagnosis not present

## 2012-12-28 ENCOUNTER — Telehealth: Payer: Self-pay | Admitting: Internal Medicine

## 2012-12-28 NOTE — Telephone Encounter (Signed)
This was done by [facility]- see note.

## 2012-12-31 ENCOUNTER — Other Ambulatory Visit (HOSPITAL_COMMUNITY): Payer: Self-pay | Admitting: Cardiovascular Disease

## 2012-12-31 DIAGNOSIS — I519 Heart disease, unspecified: Secondary | ICD-10-CM

## 2012-12-31 DIAGNOSIS — I10 Essential (primary) hypertension: Secondary | ICD-10-CM

## 2012-12-31 DIAGNOSIS — R011 Cardiac murmur, unspecified: Secondary | ICD-10-CM

## 2013-01-01 ENCOUNTER — Other Ambulatory Visit: Payer: Self-pay | Admitting: Neurosurgery

## 2013-01-01 ENCOUNTER — Ambulatory Visit (HOSPITAL_COMMUNITY)
Admission: RE | Admit: 2013-01-01 | Discharge: 2013-01-01 | Disposition: A | Discharge status: Home / Self Care | Payer: Medicare Other | Source: Ambulatory Visit | Attending: Cardiovascular Disease | Admitting: Cardiovascular Disease

## 2013-01-01 DIAGNOSIS — I080 Rheumatic disorders of both mitral and aortic valves: Secondary | ICD-10-CM | POA: Diagnosis not present

## 2013-01-01 DIAGNOSIS — R011 Cardiac murmur, unspecified: Secondary | ICD-10-CM | POA: Insufficient documentation

## 2013-01-01 DIAGNOSIS — I10 Essential (primary) hypertension: Secondary | ICD-10-CM | POA: Insufficient documentation

## 2013-01-01 DIAGNOSIS — I519 Heart disease, unspecified: Secondary | ICD-10-CM

## 2013-01-01 DIAGNOSIS — R609 Edema, unspecified: Secondary | ICD-10-CM | POA: Diagnosis not present

## 2013-01-01 DIAGNOSIS — I369 Nonrheumatic tricuspid valve disorder, unspecified: Secondary | ICD-10-CM | POA: Diagnosis not present

## 2013-01-01 NOTE — Progress Notes (Signed)
2D Echo Performed 01/01/2013    Cleora Karnik, RCS  

## 2013-01-04 ENCOUNTER — Encounter (HOSPITAL_COMMUNITY): Payer: Self-pay | Admitting: Pharmacy Technician

## 2013-01-07 ENCOUNTER — Encounter: Payer: Self-pay | Admitting: Internal Medicine

## 2013-01-08 ENCOUNTER — Other Ambulatory Visit: Payer: Self-pay

## 2013-01-08 MED ORDER — POTASSIUM CHLORIDE CRYS ER 20 MEQ PO TBCR: 20.0000 meq | EXTENDED_RELEASE_TABLET | Freq: Two times a day (BID) | ORAL | Status: DC

## 2013-01-09 ENCOUNTER — Encounter (HOSPITAL_COMMUNITY)
Admission: RE | Admit: 2013-01-09 | Discharge: 2013-01-09 | Disposition: A | Discharge status: Home / Self Care | Payer: Medicare Other | Source: Ambulatory Visit | Attending: Anesthesiology | Admitting: Anesthesiology

## 2013-01-09 ENCOUNTER — Encounter (HOSPITAL_COMMUNITY)
Admission: RE | Admit: 2013-01-09 | Discharge: 2013-01-09 | Disposition: A | Discharge status: Home / Self Care | Payer: Medicare Other | Source: Ambulatory Visit | Attending: Neurosurgery | Admitting: Neurosurgery

## 2013-01-09 ENCOUNTER — Encounter (HOSPITAL_COMMUNITY): Payer: Self-pay

## 2013-01-09 DIAGNOSIS — M47817 Spondylosis without myelopathy or radiculopathy, lumbosacral region: Secondary | ICD-10-CM | POA: Diagnosis not present

## 2013-01-09 DIAGNOSIS — Z888 Allergy status to other drugs, medicaments and biological substances status: Secondary | ICD-10-CM | POA: Diagnosis not present

## 2013-01-09 DIAGNOSIS — M7130 Other bursal cyst, unspecified site: Secondary | ICD-10-CM | POA: Diagnosis not present

## 2013-01-09 DIAGNOSIS — M5137 Other intervertebral disc degeneration, lumbosacral region: Secondary | ICD-10-CM | POA: Diagnosis not present

## 2013-01-09 DIAGNOSIS — Z79899 Other long term (current) drug therapy: Secondary | ICD-10-CM | POA: Diagnosis not present

## 2013-01-09 DIAGNOSIS — E119 Type 2 diabetes mellitus without complications: Secondary | ICD-10-CM | POA: Diagnosis present

## 2013-01-09 DIAGNOSIS — E039 Hypothyroidism, unspecified: Secondary | ICD-10-CM | POA: Diagnosis not present

## 2013-01-09 DIAGNOSIS — Z01812 Encounter for preprocedural laboratory examination: Secondary | ICD-10-CM | POA: Diagnosis not present

## 2013-01-09 DIAGNOSIS — Z01818 Encounter for other preprocedural examination: Secondary | ICD-10-CM | POA: Diagnosis not present

## 2013-01-09 DIAGNOSIS — Z7982 Long term (current) use of aspirin: Secondary | ICD-10-CM | POA: Diagnosis not present

## 2013-01-09 DIAGNOSIS — Z96659 Presence of unspecified artificial knee joint: Secondary | ICD-10-CM | POA: Diagnosis not present

## 2013-01-09 DIAGNOSIS — Z96619 Presence of unspecified artificial shoulder joint: Secondary | ICD-10-CM | POA: Diagnosis not present

## 2013-01-09 DIAGNOSIS — M5126 Other intervertebral disc displacement, lumbar region: Secondary | ICD-10-CM | POA: Diagnosis not present

## 2013-01-09 DIAGNOSIS — M48061 Spinal stenosis, lumbar region without neurogenic claudication: Secondary | ICD-10-CM | POA: Diagnosis not present

## 2013-01-09 DIAGNOSIS — I10 Essential (primary) hypertension: Secondary | ICD-10-CM | POA: Diagnosis not present

## 2013-01-09 LAB — CBC
HCT: 44 % (ref 36.0–46.0)
Hemoglobin: 15.1 g/dL — ABNORMAL HIGH (ref 12.0–15.0)
MCH: 33.3 pg (ref 26.0–34.0)
MCHC: 34.3 g/dL (ref 30.0–36.0)
MCV: 97.1 fL (ref 78.0–100.0)
Platelets: 289 10*3/uL (ref 150–400)
RBC: 4.53 MIL/uL (ref 3.87–5.11)
RDW: 12.7 % (ref 11.5–15.5)
WBC: 8.5 10*3/uL (ref 4.0–10.5)

## 2013-01-09 LAB — COMPREHENSIVE METABOLIC PANEL
ALT: 31 U/L (ref 0–35)
AST: 30 U/L (ref 0–37)
Albumin: 3.8 g/dL (ref 3.5–5.2)
Alkaline Phosphatase: 83 U/L (ref 39–117)
BUN: 15 mg/dL (ref 6–23)
CO2: 27 mEq/L (ref 19–32)
Calcium: 9.9 mg/dL (ref 8.4–10.5)
Chloride: 99 mEq/L (ref 96–112)
Creatinine, Ser: 0.84 mg/dL (ref 0.50–1.10)
GFR calc Af Amer: 77 mL/min — ABNORMAL LOW (ref >90)
GFR calc non Af Amer: 67 mL/min — ABNORMAL LOW (ref >90)
Glucose, Bld: 103 mg/dL — ABNORMAL HIGH (ref 70–99)
Potassium: 3.9 mEq/L (ref 3.5–5.1)
Sodium: 139 mEq/L (ref 135–145)
Total Bilirubin: 0.6 mg/dL (ref 0.3–1.2)
Total Protein: 8 g/dL (ref 6.0–8.3)

## 2013-01-09 LAB — SURGICAL PCR SCREEN
MRSA, PCR: NEGATIVE
Staphylococcus aureus: NEGATIVE

## 2013-01-09 NOTE — Pre-Procedure Instructions (Signed)
[place] [facility]  01/09/2013   Your procedure is scheduled on:  Friday, Feb. 21st   Report to [facility] at 1:00 PM.  Call this number if you have problems the morning of surgery: [phone]   Remember:   Do not eat food or drink liquids after midnight Thursday.   Take these medicines the morning of surgery with A SIP OF WATER: Norvasc, Levothyroxine   Do not wear jewelry, make-up or nail polish.  Do not wear lotions, powders, or perfumes. You may NOT wear deodorant.  Do not shave underarms or legs 48 hours prior to surgery.    Do not bring valuables to the hospital.  Contacts, dentures or bridgework may not be worn into surgery.   Leave suitcase in the car. After surgery it may be brought to your room.  For patients admitted to the hospital, checkout time is 11:00 AM the day of discharge.   Patients discharged the day of surgery will not be allowed to drive home.   Name and phone number of your driver:    Special Instructions: Shower using CHG 2 nights before surgery and the night before surgery.  If you shower the day of surgery use CHG.  Use special wash - you have one bottle of CHG for all showers.  You should use approximately 1/3 of the bottle for each shower.   Please read over the following fact sheets that you were given: Pain Booklet, Coughing and Deep Breathing, MRSA Information and Surgical Site Infection Prevention

## 2013-01-09 NOTE — Progress Notes (Addendum)
Pain description that was taken at 12:45 is incorrect and please dismiss these results...Marland KitchenMarland KitchenDA   1420. I have requested ekg, stress & echo from 2012 and pt states has had in Feb 2014 a  EKG & Echo...requested those also.....Marland KitchenDA

## 2013-01-11 ENCOUNTER — Encounter (HOSPITAL_COMMUNITY)
Admission: RE | Disposition: A | Discharge status: Home / Self Care | Payer: Self-pay | Source: Ambulatory Visit | Attending: Neurosurgery

## 2013-01-11 ENCOUNTER — Inpatient Hospital Stay (HOSPITAL_COMMUNITY): Payer: Medicare Other

## 2013-01-11 ENCOUNTER — Encounter (HOSPITAL_COMMUNITY): Payer: Self-pay | Admitting: Certified Registered"

## 2013-01-11 ENCOUNTER — Encounter (HOSPITAL_COMMUNITY): Payer: Self-pay | Admitting: *Deleted

## 2013-01-11 ENCOUNTER — Inpatient Hospital Stay (HOSPITAL_COMMUNITY)
Admission: RE | Admit: 2013-01-11 | Discharge: 2013-01-12 | DRG: 491 | Disposition: A | Discharge status: Home / Self Care | Payer: Medicare Other | Source: Ambulatory Visit | Attending: Neurosurgery | Admitting: Neurosurgery

## 2013-01-11 ENCOUNTER — Inpatient Hospital Stay (HOSPITAL_COMMUNITY): Payer: Medicare Other | Admitting: Certified Registered"

## 2013-01-11 DIAGNOSIS — E039 Hypothyroidism, unspecified: Secondary | ICD-10-CM | POA: Diagnosis present

## 2013-01-11 DIAGNOSIS — M5126 Other intervertebral disc displacement, lumbar region: Secondary | ICD-10-CM | POA: Diagnosis not present

## 2013-01-11 DIAGNOSIS — Z96659 Presence of unspecified artificial knee joint: Secondary | ICD-10-CM

## 2013-01-11 DIAGNOSIS — M7130 Other bursal cyst, unspecified site: Secondary | ICD-10-CM | POA: Diagnosis present

## 2013-01-11 DIAGNOSIS — M47817 Spondylosis without myelopathy or radiculopathy, lumbosacral region: Secondary | ICD-10-CM | POA: Diagnosis not present

## 2013-01-11 DIAGNOSIS — I10 Essential (primary) hypertension: Secondary | ICD-10-CM | POA: Diagnosis present

## 2013-01-11 DIAGNOSIS — E119 Type 2 diabetes mellitus without complications: Secondary | ICD-10-CM | POA: Diagnosis present

## 2013-01-11 DIAGNOSIS — M48061 Spinal stenosis, lumbar region without neurogenic claudication: Secondary | ICD-10-CM | POA: Diagnosis not present

## 2013-01-11 DIAGNOSIS — M5137 Other intervertebral disc degeneration, lumbosacral region: Secondary | ICD-10-CM | POA: Diagnosis not present

## 2013-01-11 DIAGNOSIS — Z888 Allergy status to other drugs, medicaments and biological substances status: Secondary | ICD-10-CM

## 2013-01-11 DIAGNOSIS — Z96619 Presence of unspecified artificial shoulder joint: Secondary | ICD-10-CM

## 2013-01-11 DIAGNOSIS — Z7982 Long term (current) use of aspirin: Secondary | ICD-10-CM

## 2013-01-11 DIAGNOSIS — Z79899 Other long term (current) drug therapy: Secondary | ICD-10-CM

## 2013-01-11 DIAGNOSIS — Z01812 Encounter for preprocedural laboratory examination: Secondary | ICD-10-CM

## 2013-01-11 HISTORY — PX: LUMBAR LAMINECTOMY/DECOMPRESSION MICRODISCECTOMY: SHX5026

## 2013-01-11 SURGERY — LUMBAR LAMINECTOMY/DECOMPRESSION MICRODISCECTOMY 1 LEVEL
Anesthesia: General | Site: Spine Lumbar | Laterality: Right | Wound class: Clean

## 2013-01-11 MED ORDER — FENTANYL CITRATE 0.05 MG/ML IJ SOLN
INTRAMUSCULAR | Status: DC | PRN
  Administered 2013-01-11 (×2): 50 ug via INTRAVENOUS
  Administered 2013-01-11: 150 ug via INTRAVENOUS

## 2013-01-11 MED ORDER — CYCLOBENZAPRINE HCL 10 MG PO TABS: 10.0000 mg | ORAL_TABLET | Freq: Three times a day (TID) | ORAL | Status: DC | PRN

## 2013-01-11 MED ORDER — HYDROMORPHONE HCL PF 1 MG/ML IJ SOLN
0.2500 mg | INTRAMUSCULAR | Status: DC | PRN
  Administered 2013-01-11 (×3): 0.5 mg via INTRAVENOUS

## 2013-01-11 MED ORDER — OXYCODONE HCL 5 MG PO TABS: 5.0000 mg | ORAL_TABLET | ORAL | Status: DC | PRN

## 2013-01-11 MED ORDER — LEVOTHYROXINE SODIUM 75 MCG PO TABS
75.0000 ug | ORAL_TABLET | Freq: Every day | ORAL | Status: DC
  Administered 2013-01-12: 75 ug via ORAL
  Filled 2013-01-11 (×2): qty 1

## 2013-01-11 MED ORDER — MENTHOL 3 MG MT LOZG: 1.0000 | LOZENGE | OROMUCOSAL | Status: DC | PRN

## 2013-01-11 MED ORDER — THROMBIN 5000 UNITS EX KIT
PACK | CUTANEOUS | Status: DC | PRN
  Administered 2013-01-11 (×2): 5000 [IU] via TOPICAL

## 2013-01-11 MED ORDER — SODIUM CHLORIDE 0.9 % IJ SOLN
3.0000 mL | Freq: Two times a day (BID) | INTRAMUSCULAR | Status: DC
  Administered 2013-01-11: 3 mL via INTRAVENOUS

## 2013-01-11 MED ORDER — HYDROMORPHONE HCL PF 1 MG/ML IJ SOLN: 0.5000 mg | INTRAMUSCULAR | Status: DC | PRN

## 2013-01-11 MED ORDER — DOCUSATE SODIUM 100 MG PO CAPS
100.0000 mg | ORAL_CAPSULE | Freq: Two times a day (BID) | ORAL | Status: DC
  Administered 2013-01-11: 100 mg via ORAL
  Filled 2013-01-11: qty 1

## 2013-01-11 MED ORDER — TORSEMIDE 20 MG PO TABS
40.0000 mg | ORAL_TABLET | Freq: Every morning | ORAL | Status: DC
Start: 2013-01-12 — End: 2013-01-12
  Filled 2013-01-11: qty 2

## 2013-01-11 MED ORDER — LOSARTAN POTASSIUM 50 MG PO TABS
100.0000 mg | ORAL_TABLET | Freq: Every day | ORAL | Status: DC
  Administered 2013-01-11: 100 mg via ORAL
  Filled 2013-01-11 (×2): qty 2

## 2013-01-11 MED ORDER — ONDANSETRON HCL 4 MG/2ML IJ SOLN
INTRAMUSCULAR | Status: DC | PRN
  Administered 2013-01-11: 4 mg via INTRAVENOUS

## 2013-01-11 MED ORDER — SODIUM CHLORIDE 0.9 % IV SOLN: 250.0000 mL | INTRAVENOUS | Status: DC

## 2013-01-11 MED ORDER — HYDROMORPHONE HCL PF 1 MG/ML IJ SOLN
INTRAMUSCULAR | Status: AC
  Filled 2013-01-11: qty 1

## 2013-01-11 MED ORDER — ROCURONIUM BROMIDE 100 MG/10ML IV SOLN
INTRAVENOUS | Status: DC | PRN
  Administered 2013-01-11: 50 mg via INTRAVENOUS

## 2013-01-11 MED ORDER — PROPOFOL 10 MG/ML IV BOLUS
INTRAVENOUS | Status: DC | PRN
  Administered 2013-01-11: 200 mg via INTRAVENOUS

## 2013-01-11 MED ORDER — LIDOCAINE HCL (CARDIAC) 20 MG/ML IV SOLN
INTRAVENOUS | Status: DC | PRN
  Administered 2013-01-11: 100 mg via INTRAVENOUS

## 2013-01-11 MED ORDER — 0.9 % SODIUM CHLORIDE (POUR BTL) OPTIME
TOPICAL | Status: DC | PRN
  Administered 2013-01-11: 1000 mL

## 2013-01-11 MED ORDER — CALCIUM CARBONATE 600 MG PO TABS
600.0000 mg | ORAL_TABLET | Freq: Two times a day (BID) | ORAL | Status: DC
  Filled 2013-01-11: qty 1

## 2013-01-11 MED ORDER — LACTATED RINGERS IV SOLN
INTRAVENOUS | Status: DC | PRN
  Administered 2013-01-11 (×2): via INTRAVENOUS

## 2013-01-11 MED ORDER — PANTOPRAZOLE SODIUM 40 MG IV SOLR
40.0000 mg | Freq: Every day | INTRAVENOUS | Status: DC
  Administered 2013-01-11: 40 mg via INTRAVENOUS
  Filled 2013-01-11 (×2): qty 40

## 2013-01-11 MED ORDER — LIDOCAINE-EPINEPHRINE 1 %-1:100000 IJ SOLN
INTRAMUSCULAR | Status: DC | PRN
  Administered 2013-01-11: 10 mL

## 2013-01-11 MED ORDER — CALCIUM CARBONATE 1250 (500 CA) MG PO TABS
1.0000 | ORAL_TABLET | Freq: Two times a day (BID) | ORAL | Status: DC
  Filled 2013-01-11 (×3): qty 1

## 2013-01-11 MED ORDER — POTASSIUM CHLORIDE CRYS ER 20 MEQ PO TBCR
20.0000 meq | EXTENDED_RELEASE_TABLET | Freq: Two times a day (BID) | ORAL | Status: DC
  Administered 2013-01-11: 20 meq via ORAL
  Filled 2013-01-11 (×3): qty 1

## 2013-01-11 MED ORDER — SODIUM CHLORIDE 0.9 % IJ SOLN: 3.0000 mL | INTRAMUSCULAR | Status: DC | PRN

## 2013-01-11 MED ORDER — CEFAZOLIN SODIUM-DEXTROSE 2-3 GM-% IV SOLR
INTRAVENOUS | Status: AC
  Filled 2013-01-11: qty 50

## 2013-01-11 MED ORDER — ASPIRIN EC 81 MG PO TBEC
81.0000 mg | DELAYED_RELEASE_TABLET | Freq: Every day | ORAL | Status: DC
  Filled 2013-01-11: qty 1

## 2013-01-11 MED ORDER — SODIUM CHLORIDE 0.9 % IV SOLN
INTRAVENOUS | Status: AC
  Filled 2013-01-11: qty 500

## 2013-01-11 MED ORDER — ACETAMINOPHEN 10 MG/ML IV SOLN
INTRAVENOUS | Status: AC
  Administered 2013-01-11: 1000 mg via INTRAVENOUS
  Filled 2013-01-11: qty 100

## 2013-01-11 MED ORDER — BUPIVACAINE HCL (PF) 0.25 % IJ SOLN
INTRAMUSCULAR | Status: DC | PRN
  Administered 2013-01-11: 10 mL

## 2013-01-11 MED ORDER — BACITRACIN 50000 UNITS IM SOLR
INTRAMUSCULAR | Status: AC
  Filled 2013-01-11: qty 1

## 2013-01-11 MED ORDER — AMLODIPINE BESYLATE 5 MG PO TABS
5.0000 mg | ORAL_TABLET | Freq: Every day | ORAL | Status: DC
  Filled 2013-01-11: qty 1

## 2013-01-11 MED ORDER — NEOSTIGMINE METHYLSULFATE 1 MG/ML IJ SOLN
INTRAMUSCULAR | Status: DC | PRN
  Administered 2013-01-11: 3 mg via INTRAVENOUS

## 2013-01-11 MED ORDER — ONDANSETRON HCL 4 MG/2ML IJ SOLN: 4.0000 mg | Freq: Four times a day (QID) | INTRAMUSCULAR | Status: DC | PRN

## 2013-01-11 MED ORDER — GLYCOPYRROLATE 0.2 MG/ML IJ SOLN
INTRAMUSCULAR | Status: DC | PRN
  Administered 2013-01-11: 0.4 mg via INTRAVENOUS

## 2013-01-11 MED ORDER — PROMETHAZINE HCL 25 MG/ML IJ SOLN: 6.2500 mg | INTRAMUSCULAR | Status: DC | PRN

## 2013-01-11 MED ORDER — PHENOL 1.4 % MT LIQD: 1.0000 | OROMUCOSAL | Status: DC | PRN

## 2013-01-11 MED ORDER — MIDAZOLAM HCL 5 MG/5ML IJ SOLN
INTRAMUSCULAR | Status: DC | PRN
  Administered 2013-01-11: 2 mg via INTRAVENOUS

## 2013-01-11 MED ORDER — HEMOSTATIC AGENTS (NO CHARGE) OPTIME
TOPICAL | Status: DC | PRN
  Administered 2013-01-11: 1 via TOPICAL

## 2013-01-11 MED ORDER — SODIUM CHLORIDE 0.9 % IR SOLN
Status: DC | PRN
  Administered 2013-01-11: 18:00:00

## 2013-01-11 MED ORDER — ASPIRIN 81 MG PO TBEC: 81.0000 mg | DELAYED_RELEASE_TABLET | Freq: Every day | ORAL | Status: DC

## 2013-01-11 SURGICAL SUPPLY — 63 items
ADH SKN CLS APL DERMABOND .7 (GAUZE/BANDAGES/DRESSINGS)
APL SKNCLS STERI-STRIP NONHPOA (GAUZE/BANDAGES/DRESSINGS) ×1
BAG DECANTER FOR FLEXI CONT (MISCELLANEOUS) ×2 IMPLANT
BENZOIN TINCTURE PRP APPL 2/3 (GAUZE/BANDAGES/DRESSINGS) ×2 IMPLANT
BLADE SURG 11 STRL SS (BLADE) ×2 IMPLANT
BLADE SURG ROTATE 9660 (MISCELLANEOUS) IMPLANT
BRUSH SCRUB EZ PLAIN DRY (MISCELLANEOUS) ×2 IMPLANT
BUR MATCHSTICK NEURO 3.0 LAGG (BURR) ×2 IMPLANT
BUR PRECISION FLUTE 6.0 (BURR) ×2 IMPLANT
CANISTER SUCTION 2500CC (MISCELLANEOUS) ×2 IMPLANT
CLOTH BEACON ORANGE TIMEOUT ST (SAFETY) ×2 IMPLANT
CONT SPEC 4OZ CLIKSEAL STRL BL (MISCELLANEOUS) ×2 IMPLANT
DECANTER SPIKE VIAL GLASS SM (MISCELLANEOUS) ×2 IMPLANT
DERMABOND ADVANCED (GAUZE/BANDAGES/DRESSINGS)
DERMABOND ADVANCED .7 DNX12 (GAUZE/BANDAGES/DRESSINGS) ×1 IMPLANT
DRAPE LAPAROTOMY 100X72X124 (DRAPES) ×2 IMPLANT
DRAPE MICROSCOPE LEICA (MISCELLANEOUS) ×1 IMPLANT
DRAPE MICROSCOPE ZEISS OPMI (DRAPES) ×1 IMPLANT
DRAPE POUCH INSTRU U-SHP 10X18 (DRAPES) ×2 IMPLANT
DRAPE PROXIMA HALF (DRAPES) IMPLANT
DRAPE SURG 17X23 STRL (DRAPES) ×2 IMPLANT
DRESSING TELFA 8X3 (GAUZE/BANDAGES/DRESSINGS) ×1 IMPLANT
DRSG OPSITE 4X5.5 SM (GAUZE/BANDAGES/DRESSINGS) ×2 IMPLANT
ELECT BLADE 4.0 EZ CLEAN MEGAD (MISCELLANEOUS) ×2
ELECT REM PT RETURN 9FT ADLT (ELECTROSURGICAL) ×2
ELECTRODE BLDE 4.0 EZ CLN MEGD (MISCELLANEOUS) IMPLANT
ELECTRODE REM PT RTRN 9FT ADLT (ELECTROSURGICAL) ×1 IMPLANT
GAUZE SPONGE 4X4 16PLY XRAY LF (GAUZE/BANDAGES/DRESSINGS) IMPLANT
GLOVE BIO SURGEON STRL SZ 6.5 (GLOVE) ×5 IMPLANT
GLOVE BIO SURGEON STRL SZ8 (GLOVE) ×2 IMPLANT
GLOVE BIO SURGEON STRL SZ8.5 (GLOVE) ×1 IMPLANT
GLOVE BIOGEL PI IND STRL 6.5 (GLOVE) IMPLANT
GLOVE BIOGEL PI IND STRL 7.0 (GLOVE) IMPLANT
GLOVE BIOGEL PI INDICATOR 6.5 (GLOVE) ×2
GLOVE BIOGEL PI INDICATOR 7.0 (GLOVE) ×1
GLOVE EXAM NITRILE LRG STRL (GLOVE) IMPLANT
GLOVE EXAM NITRILE MD LF STRL (GLOVE) IMPLANT
GLOVE EXAM NITRILE XL STR (GLOVE) IMPLANT
GLOVE EXAM NITRILE XS STR PU (GLOVE) IMPLANT
GLOVE INDICATOR 8.5 STRL (GLOVE) ×2 IMPLANT
GLOVE OPTIFIT SS 8.0 STRL (GLOVE) ×1 IMPLANT
GOWN BRE IMP SLV AUR LG STRL (GOWN DISPOSABLE) ×4 IMPLANT
GOWN BRE IMP SLV AUR XL STRL (GOWN DISPOSABLE) ×4 IMPLANT
GOWN STRL REIN 2XL LVL4 (GOWN DISPOSABLE) IMPLANT
KIT BASIN OR (CUSTOM PROCEDURE TRAY) ×2 IMPLANT
KIT ROOM TURNOVER OR (KITS) ×2 IMPLANT
NDL SPNL 22GX3.5 QUINCKE BK (NEEDLE) ×1 IMPLANT
NEEDLE HYPO 22GX1.5 SAFETY (NEEDLE) ×2 IMPLANT
NEEDLE SPNL 22GX3.5 QUINCKE BK (NEEDLE) ×2 IMPLANT
NS IRRIG 1000ML POUR BTL (IV SOLUTION) ×2 IMPLANT
PACK LAMINECTOMY NEURO (CUSTOM PROCEDURE TRAY) ×2 IMPLANT
RUBBERBAND STERILE (MISCELLANEOUS) ×4 IMPLANT
SPONGE GAUZE 4X4 12PLY (GAUZE/BANDAGES/DRESSINGS) ×2 IMPLANT
SPONGE SURGIFOAM ABS GEL SZ50 (HEMOSTASIS) ×2 IMPLANT
STRIP CLOSURE SKIN 1/2X4 (GAUZE/BANDAGES/DRESSINGS) ×2 IMPLANT
SUT VIC AB 0 CT1 18XCR BRD8 (SUTURE) ×1 IMPLANT
SUT VIC AB 0 CT1 8-18 (SUTURE) ×2
SUT VIC AB 2-0 CT1 18 (SUTURE) ×2 IMPLANT
SUT VICRYL 4-0 PS2 18IN ABS (SUTURE) ×2 IMPLANT
SYR 20ML ECCENTRIC (SYRINGE) ×2 IMPLANT
TOWEL OR 17X24 6PK STRL BLUE (TOWEL DISPOSABLE) ×2 IMPLANT
TOWEL OR 17X26 10 PK STRL BLUE (TOWEL DISPOSABLE) ×2 IMPLANT
WATER STERILE IRR 1000ML POUR (IV SOLUTION) ×2 IMPLANT

## 2013-01-11 NOTE — H&P (Signed)
[place] Tara [name] is an 77 y.o. female.   Chief Complaint: Back right hip and leg pain HPI: Patient is a 70 to female presents with back and pain radiating to her right hip down the outside of her right thigh the front shin consistent with an L4 nerve root pattern she also has some pain anterior quad this is been refractory to all forms of conservative treatment anti-inflammatories physical therapy epidural steroid injections. Workup and imaging revealed severe stenosis at L3-4 with a significant compression of the right L4 nerve root this correlate with her clinical syndrome due to her failure conservative treatment progression syndrome and imaging findings I recommended decompressive laminectomy and possible discectomy at L3-4 the right one of the wrist nevus of the operation with her as well as peri-operative Course expectations about alternatives of surgery she understands and agrees to proceed forward.  Past Medical History  Diagnosis Date  . Thyroid disease     hypothyroidism  . Arthritis     knees  . Obesity   . Pre-diabetes   . Dependent edema   . Hypertension   . Essential hypertension, benign 04/15/2011    Past Surgical History  Procedure Laterality Date  . Knee arthroscopy      left knee  . Joint replacement      rt shoulder rotator cuff  . Hand surgery  1951    RIGHT HAND  . Ankle surgery  1994    RIGHT  . Rotator cuff repair  2011    RIGHT  . Total knee arthroplasty      Left  . Tonsillectomy      Family History  Problem Relation Age of Onset  . Breast cancer Sister   . Cancer Brother     Kidney  . Heart failure Father   . Uterine cancer Sister   . Breast cancer Sister     Uterine  . Cancer Brother     Lung   Social History:  reports that she has never smoked. She has never used smokeless tobacco. She reports that she does not drink alcohol or use illicit drugs.  Allergies:  Allergies  Allergen Reactions  . Ceclor (Cefaclor) Hives  . Meloxicam Nausea And  Vomiting  . Vicodin (Hydrocodone-Acetaminophen) Nausea And Vomiting    Medications Prior to Admission  Medication Sig Dispense Refill  . amLODipine (NORVASC) 5 MG tablet Take 5 mg by mouth daily.      Marland Kitchen aspirin 81 MG EC tablet Take 81 mg by mouth daily.        . calcium carbonate (OS-CAL) 600 MG TABS Take 600 mg by mouth 2 (two) times daily with a meal.        . Cholecalciferol (VITAMIN D3) 1000 UNITS CAPS Take 1,000 Units by mouth 2 (two) times daily.        Marland Kitchen levothyroxine (SYNTHROID, LEVOTHROID) 75 MCG tablet Take 75 mcg by mouth every morning.      Marland Kitchen losartan (COZAAR) 100 MG tablet Take 100 mg by mouth daily.      . potassium chloride SA (K-DUR,KLOR-CON) 20 MEQ tablet Take 1 tablet (20 mEq total) by mouth 2 (two) times daily.  180 tablet  3  . torsemide (DEMADEX) 20 MG tablet Take 40 mg by mouth every morning.         No results found for this or any previous visit (from the past 48 hour(s)). No results found.  Review of Systems  Constitutional: Negative.   HENT: Negative.   Eyes:  Negative.   Respiratory: Negative.   Cardiovascular: Negative.   Gastrointestinal: Negative.   Genitourinary: Negative.   Musculoskeletal: Positive for myalgias and back pain.  Skin: Negative.   Neurological: Positive for sensory change.  Endo/Heme/Allergies: Negative.   Psychiatric/Behavioral: Negative.     Blood pressure 131/83, pulse 88, temperature 98.1 F (36.7 C), temperature source Oral, resp. rate 20, SpO2 97.00%. Physical Exam  Constitutional: She is oriented to person, place, and time. She appears well-developed and well-nourished.  HENT:  Head: Normocephalic.  Eyes: Pupils are equal, round, and reactive to light.  Neck: Normal range of motion.  Cardiovascular: Normal rate.   Respiratory: Effort normal.  GI: Soft.  Musculoskeletal: Normal range of motion.  Neurological: She is alert and oriented to person, place, and time. She has normal strength. GCS eye subscore is 4. GCS verbal  subscore is 5. GCS motor subscore is 6.  Reflex Scores:      Tricep reflexes are 1+ on the right side and 1+ on the left side.      Bicep reflexes are 1+ on the right side and 1+ on the left side.      Brachioradialis reflexes are 1+ on the right side and 1+ on the left side.      Patellar reflexes are 1+ on the right side and 1+ on the left side.      Achilles reflexes are 1+ on the right side and 1+ on the left side. Strength is 5 out of 5 in her iliopsoas, quads, hip she's, gastrocs and into tibialis and EHL.     Assessment/Plan 7 to female presents for right-sided decompressive laminectomy at L3-4  Zamia Tyminski P 01/11/2013, 4:54 PM

## 2013-01-11 NOTE — Anesthesia Procedure Notes (Signed)
Procedure Name: Intubation Date/Time: 01/11/2013 5:32 PM Performed by: Ellin Goodie Pre-anesthesia Checklist: Patient identified, Emergency Drugs available, Suction available, Patient being monitored and Timeout performed Patient Re-evaluated:Patient Re-evaluated prior to inductionOxygen Delivery Method: Circle system utilized Preoxygenation: Pre-oxygenation with 100% oxygen Intubation Type: IV induction and Cricoid Pressure applied Ventilation: Mask ventilation without difficulty Laryngoscope Size: Mac and 3 Grade View: Grade II Tube type: Oral Tube size: 7.5 mm Number of attempts: 2 Airway Equipment and Method: Stylet,  LTA kit utilized and Video-laryngoscopy Placement Confirmation: ETT inserted through vocal cords under direct vision,  positive ETCO2 and breath sounds checked- equal and bilateral Secured at: 21 cm Tube secured with: Tape Dental Injury: Teeth and Oropharynx as per pre-operative assessment

## 2013-01-11 NOTE — Transfer of Care (Signed)
Immediate Anesthesia Transfer of Care Note  Patient: [employer]  Procedure(s) Performed: Procedure(s): LUMBAR LAMINECTOMY/DECOMPRESSION MICRODISCECTOMY 1 LEVEL,Right Lumbar Three-Four (Right)  Patient Location: PACU  Anesthesia Type:General  Level of Consciousness: awake, alert , oriented and patient cooperative  Airway & Oxygen Therapy: Patient Spontanous Breathing and Patient connected to nasal cannula oxygen  Post-op Assessment: Report given to PACU RN, Post -op Vital signs reviewed and stable and Patient moving all extremities  Post vital signs: Reviewed and stable  Complications: No apparent anesthesia complications

## 2013-01-11 NOTE — Anesthesia Postprocedure Evaluation (Signed)
  Anesthesia Post-op Note  Patient: Tara [name]  Procedure(s) Performed: Procedure(s): LUMBAR LAMINECTOMY/DECOMPRESSION MICRODISCECTOMY 1 LEVEL,Right Lumbar Three-Four (Right)  Patient Location: PACU  Anesthesia Type:General  Level of Consciousness: awake, alert  and oriented  Airway and Oxygen Therapy: Patient Spontanous Breathing and Patient connected to nasal cannula oxygen  Post-op Pain: mild  Post-op Assessment: Post-op Vital signs reviewed  Post-op Vital Signs: Reviewed  Complications: No apparent anesthesia complications

## 2013-01-11 NOTE — Anesthesia Preprocedure Evaluation (Addendum)
Anesthesia Evaluation  Patient identified by MRN, date of birth, ID band Patient awake    Reviewed: Allergy & Precautions, H&P , NPO status , Patient's Chart, lab work & pertinent test results, reviewed documented beta blocker date and time   Airway Mallampati: II TM Distance: >3 FB Neck ROM: Full    Dental  (+) Teeth Intact, Dental Advisory Given and Chipped   Pulmonary neg pulmonary ROS,  breath sounds clear to auscultation        Cardiovascular hypertension, Pt. on medications Rhythm:Regular Rate:Normal     Neuro/Psych    GI/Hepatic negative GI ROS, Neg liver ROS,   Endo/Other  Hypothyroidism Morbid obesity  Renal/GU negative Renal ROS     Musculoskeletal negative musculoskeletal ROS (+)   Abdominal (+) + obese,  Abdomen: soft. Bowel sounds: normal.  Peds  Hematology negative hematology ROS (+)   Anesthesia Other Findings   Reproductive/Obstetrics negative OB ROS                        Anesthesia Physical Anesthesia Plan  ASA: III  Anesthesia Plan: General   Post-op Pain Management:    Induction: Intravenous  Airway Management Planned: Oral ETT and Video Laryngoscope Planned  Additional Equipment:   Intra-op Plan:   Post-operative Plan: Extubation in OR  Informed Consent: I have reviewed the patients History and Physical, chart, labs and discussed the procedure including the risks, benefits and alternatives for the proposed anesthesia with the patient or authorized representative who has indicated his/her understanding and acceptance.     Plan Discussed with: CRNA and Surgeon  Anesthesia Plan Comments:         Anesthesia Quick Evaluation

## 2013-01-11 NOTE — Op Note (Signed)
Preoperative diagnosis: Lumbar spinal stenosis with right-sided L3 and L4 radiculopathy from severe foraminal stenosis and herniated nucleus pulposus L3-4 right  Postoperative diagnosis: Same  Procedure: Decompressive lumbar laminectomy L3-4 right with microscopic dissection of the right L4 nerve root microscopic discectomy and microdissection and removal of synovial cyst  Surgeon: Jillyn Hidden Keyera Hattabaugh  Assistant: Tressie Stalker  Anesthesia: Gen.  EBL: Minimal  History of present illness: Patient is a 77 year female is a progress worsening back and right hip and leg pain rating down L3 and L4 nerve root pattern patient failed all forms of conservative treatment epidural steroid injections anti-inflammatories and time workup revealed severe spinal stenosis from a synovial cyst and herniated nucleus pulposus at L3-4 and the right to the patient takes her treatment progression angle syndrome and imaging findings she was recommended decompression at L3-4 the right with microscopic discectomy microdissection of the right L4 nerve root.  Operative procedure: Patient brought into the or was induced under general anesthesia positioned prone the Wilson frame her back was prepped and draped in routine sterile fashion preoperative localizing appropriate level so after infiltration 10 cc lidocaine with epi a midline incision was made and but are restricted the subcutaneous tissue. Subperiosteal dissection carried lamina of L3 and L4 and the right interoperative X. identify the appropriate level so the interest of L3 and medial facet complexes suppressant of 4 drill down a high-speed drill laminotomy was begun with a 3 minute Kerrison punch the ligament was identified and noted be markedly hypertrophied this was then removed in piecemeal fashion with also a 3 mm Kerrison punch. Then under microscopic illumination further under biting of the L3 lamina was carried out further abutting the medial facet complex was also  carried out this allowed identification immediately of a synovial cyst causing severe hourglass compression of thecal sac at the disc space level this was teased off of the dura with a 4 Penfield and removed in piecemeal fashion with a 2 and 3 minute Kerrison punch. Extending down to the L4 lamina I followed the L4 nerve root out to just inferior the L4 pedicle and then marking superiorly either to just above the disc space. The epidural veins are coagulated around the disc space in the L3 nerve root was dissected off of large disc herniations still partially contained ligament. With the dura to the L4 nerve root was reflected medially disc space was incised and cleanout pituitary rongeurs and Epstein curettes. At the end of the discectomy there is no further stenosis on thecal sac centrally with the L4 neuroforamen. A coronary.her was easily passed underneath the thecal sac along the disc space superiorly and inferiorly as well as out the foramen root medially no resistance. At the end of the decompression central and L4 nerve root foramen was widely decompressed the wounds and copiously irrigated meticulous hemostasis was maintained Gelfoam was laid up the dura and the muscle fascia proximally layers with after Vicryl and the skin was closed running 4 subcuticular or benzoin and Steri-Strips were applied patient recovered in stable condition. At the case on it counts and sponge counts were correct.

## 2013-01-12 NOTE — Progress Notes (Signed)
Patient ID: Tara [name], female   DOB: Feb 15, 1936, 77 y.o.   MRN: [id number] Patient doing very well with significant and complete resolution her preoperative radicular pain and swelling her left looks to be going down her incision looks good she is ready for discharge

## 2013-01-12 NOTE — Discharge Summary (Signed)
  Physician Discharge Summary  Patient ID: Tara [name] MRN: [id number] DOB/AGE: March 05, 1936 77 y.o.  Admit date: 01/11/2013 Discharge date: 01/12/2013  Admission Diagnoses: Lumbar spinal stenosis with herniated pulposis L3-4 right  Discharge Diagnoses: Same Active Problems:   * No active hospital problems. *   Discharged Condition: good  Hospital Course: Patient admitted hospital underwent a decompressive lumbar layer from the right and microdiscectomy postoperatively patient did very well recovered in the floor on the floor she's convalescing well ambulating and voiding spontaneously tolerating regular diet with significant improvement preoperative radicular symptoms. She stable for discharge home she has medicines at home and she'll follow with me in one to 2 weeks  Consults: Significant Diagnostic Studies: Treatments: Decompressive lumbar likely a right L4-5 and microdiscectomy Discharge Exam: Blood pressure 107/60, pulse 75, temperature 98.3 F (36.8 C), temperature source Oral, resp. rate 18, SpO2 96.00%. Strength out of 5 wound clean and dry  Disposition: Home   Future Appointments Provider Department Dept Phone   02/04/2013 10:00 AM Margaree Mackintosh, MD Sharlet Salina, MD [phone]       Medication List    TAKE these medications       amLODipine 5 MG tablet  Commonly known as:  NORVASC  Take 5 mg by mouth daily.     aspirin 81 MG EC tablet  Take 81 mg by mouth daily.     calcium carbonate 600 MG Tabs  Commonly known as:  OS-CAL  Take 600 mg by mouth 2 (two) times daily with a meal.     levothyroxine 75 MCG tablet  Commonly known as:  SYNTHROID, LEVOTHROID  Take 75 mcg by mouth every morning.     losartan 100 MG tablet  Commonly known as:  COZAAR  Take 100 mg by mouth daily.     potassium chloride SA 20 MEQ tablet  Commonly known as:  K-DUR,KLOR-CON  Take 1 tablet (20 mEq total) by mouth 2 (two) times daily.     torsemide 20 MG tablet  Commonly  known as:  DEMADEX  Take 40 mg by mouth every morning.     Vitamin D3 1000 UNITS Caps  Take 1,000 Units by mouth 2 (two) times daily.           Follow-up Information   Follow up with Raynah Gomes P, MD.   Contact information:   [address] [place] [address] [phone]       Signed: Giovanni Biby P 01/12/2013, 8:05 AM

## 2013-01-12 NOTE — Progress Notes (Signed)
Pt given D/C instructions with verbal understanding. Pt D/C'd home via wheelchair with family per MD order. Rema Fendt, RN

## 2013-01-14 ENCOUNTER — Encounter (HOSPITAL_COMMUNITY): Payer: Self-pay | Admitting: Neurosurgery

## 2013-01-14 MED FILL — Thrombin For Soln 5000 Unit: CUTANEOUS | Qty: 5000 | Status: AC

## 2013-01-18 ENCOUNTER — Telehealth: Payer: Self-pay | Admitting: Internal Medicine

## 2013-01-18 DIAGNOSIS — I10 Essential (primary) hypertension: Secondary | ICD-10-CM

## 2013-01-18 MED ORDER — LOSARTAN POTASSIUM 100 MG PO TABS: 100.0000 mg | ORAL_TABLET | Freq: Every day | ORAL | Status: DC

## 2013-01-18 MED ORDER — AMLODIPINE BESYLATE 5 MG PO TABS: 5.0000 mg | ORAL_TABLET | Freq: Every day | ORAL | Status: DC

## 2013-01-18 NOTE — Telephone Encounter (Signed)
Please  Call in to [facility] in [facility] x 6 months

## 2013-02-04 ENCOUNTER — Encounter: Payer: Medicare Other | Admitting: Internal Medicine

## 2013-02-14 DIAGNOSIS — R300 Dysuria: Secondary | ICD-10-CM | POA: Diagnosis not present

## 2013-02-22 DIAGNOSIS — IMO0002 Reserved for concepts with insufficient information to code with codable children: Secondary | ICD-10-CM | POA: Diagnosis not present

## 2013-02-28 ENCOUNTER — Other Ambulatory Visit (HOSPITAL_COMMUNITY): Payer: Self-pay | Admitting: Neurosurgery

## 2013-02-28 ENCOUNTER — Ambulatory Visit (HOSPITAL_COMMUNITY)
Admission: RE | Admit: 2013-02-28 | Discharge: 2013-02-28 | Disposition: A | Discharge status: Home / Self Care | Payer: Medicare Other | Source: Ambulatory Visit | Attending: Neurosurgery | Admitting: Neurosurgery

## 2013-02-28 DIAGNOSIS — M7989 Other specified soft tissue disorders: Secondary | ICD-10-CM

## 2013-02-28 DIAGNOSIS — M25569 Pain in unspecified knee: Secondary | ICD-10-CM

## 2013-02-28 DIAGNOSIS — R609 Edema, unspecified: Secondary | ICD-10-CM

## 2013-02-28 NOTE — Progress Notes (Signed)
Bilateral lower extremity venous duplex:  No evidence of DVT, superficial thrombosis, or Baker's Cyst.   

## 2013-03-04 ENCOUNTER — Other Ambulatory Visit: Payer: Self-pay | Admitting: Internal Medicine

## 2013-03-04 ENCOUNTER — Other Ambulatory Visit: Payer: Self-pay

## 2013-03-04 MED ORDER — LEVOTHYROXINE SODIUM 75 MCG PO TABS: 75.0000 ug | ORAL_TABLET | Freq: Every morning | ORAL | Status: DC

## 2013-03-04 NOTE — Telephone Encounter (Signed)
TSH last checked November. Can refill x 30 days and see in 3 weeks.

## 2013-03-07 DIAGNOSIS — R300 Dysuria: Secondary | ICD-10-CM | POA: Diagnosis not present

## 2013-04-05 ENCOUNTER — Other Ambulatory Visit: Payer: Self-pay | Admitting: Internal Medicine

## 2013-05-06 ENCOUNTER — Encounter: Payer: Self-pay | Admitting: Internal Medicine

## 2013-05-27 ENCOUNTER — Encounter: Payer: Self-pay | Admitting: Internal Medicine

## 2013-05-27 ENCOUNTER — Ambulatory Visit (INDEPENDENT_AMBULATORY_CARE_PROVIDER_SITE_OTHER): Payer: Medicare Other | Admitting: Women's Health

## 2013-05-27 ENCOUNTER — Encounter: Payer: Self-pay | Admitting: Women's Health

## 2013-05-27 ENCOUNTER — Ambulatory Visit (INDEPENDENT_AMBULATORY_CARE_PROVIDER_SITE_OTHER): Payer: Medicare Other | Admitting: Internal Medicine

## 2013-05-27 ENCOUNTER — Other Ambulatory Visit (HOSPITAL_COMMUNITY)
Admission: RE | Admit: 2013-05-27 | Discharge: 2013-05-27 | Disposition: A | Discharge status: Home / Self Care | Payer: Medicare Other | Source: Ambulatory Visit | Attending: Gynecology | Admitting: Gynecology

## 2013-05-27 VITALS — BP 130/76 | HR 80 | Temp 98.7°F | Ht 64.0 in | Wt 238.0 lb

## 2013-05-27 VITALS — BP 130/78 | Ht 64.0 in | Wt 230.0 lb

## 2013-05-27 DIAGNOSIS — M949 Disorder of cartilage, unspecified: Secondary | ICD-10-CM | POA: Diagnosis not present

## 2013-05-27 DIAGNOSIS — R7303 Prediabetes: Secondary | ICD-10-CM

## 2013-05-27 DIAGNOSIS — R82998 Other abnormal findings in urine: Secondary | ICD-10-CM

## 2013-05-27 DIAGNOSIS — I10 Essential (primary) hypertension: Secondary | ICD-10-CM

## 2013-05-27 DIAGNOSIS — Z13220 Encounter for screening for lipoid disorders: Secondary | ICD-10-CM | POA: Diagnosis not present

## 2013-05-27 DIAGNOSIS — M899 Disorder of bone, unspecified: Secondary | ICD-10-CM

## 2013-05-27 DIAGNOSIS — E039 Hypothyroidism, unspecified: Secondary | ICD-10-CM | POA: Diagnosis not present

## 2013-05-27 DIAGNOSIS — Z130 Encounter for screening for diseases of the blood and blood-forming organs and certain disorders involving the immune mechanism: Secondary | ICD-10-CM

## 2013-05-27 DIAGNOSIS — M1710 Unilateral primary osteoarthritis, unspecified knee: Secondary | ICD-10-CM

## 2013-05-27 DIAGNOSIS — Z124 Encounter for screening for malignant neoplasm of cervix: Secondary | ICD-10-CM | POA: Diagnosis not present

## 2013-05-27 DIAGNOSIS — Z780 Asymptomatic menopausal state: Secondary | ICD-10-CM

## 2013-05-27 DIAGNOSIS — Z23 Encounter for immunization: Secondary | ICD-10-CM | POA: Diagnosis not present

## 2013-05-27 DIAGNOSIS — E669 Obesity, unspecified: Secondary | ICD-10-CM

## 2013-05-27 DIAGNOSIS — Z1329 Encounter for screening for other suspected endocrine disorder: Secondary | ICD-10-CM

## 2013-05-27 DIAGNOSIS — Z13228 Encounter for screening for other metabolic disorders: Secondary | ICD-10-CM | POA: Diagnosis not present

## 2013-05-27 DIAGNOSIS — R7309 Other abnormal glucose: Secondary | ICD-10-CM

## 2013-05-27 DIAGNOSIS — M170 Bilateral primary osteoarthritis of knee: Secondary | ICD-10-CM

## 2013-05-27 LAB — CBC WITH DIFFERENTIAL/PLATELET
Basophils Absolute: 0.1 10*3/uL (ref 0.0–0.1)
Basophils Relative: 1 % (ref 0–1)
Eosinophils Absolute: 0.4 10*3/uL (ref 0.0–0.7)
Eosinophils Relative: 4 % (ref 0–5)
HCT: 39 % (ref 36.0–46.0)
Hemoglobin: 13.7 g/dL (ref 12.0–15.0)
Lymphocytes Relative: 37 % (ref 12–46)
Lymphs Abs: 3.6 10*3/uL (ref 0.7–4.0)
MCH: 31.6 pg (ref 26.0–34.0)
MCHC: 35.1 g/dL (ref 30.0–36.0)
MCV: 90.1 fL (ref 78.0–100.0)
Monocytes Absolute: 0.6 10*3/uL (ref 0.1–1.0)
Monocytes Relative: 6 % (ref 3–12)
Neutro Abs: 5.2 10*3/uL (ref 1.7–7.7)
Neutrophils Relative %: 52 % (ref 43–77)
Platelets: 316 10*3/uL (ref 150–400)
RBC: 4.33 MIL/uL (ref 3.87–5.11)
RDW: 14 % (ref 11.5–15.5)
WBC: 9.9 10*3/uL (ref 4.0–10.5)

## 2013-05-27 LAB — POCT URINALYSIS DIPSTICK
Bilirubin, UA: NEGATIVE
Glucose, UA: NEGATIVE
Ketones, UA: NEGATIVE
Nitrite, UA: NEGATIVE
Protein, UA: NEGATIVE
Spec Grav, UA: 1.01
Urobilinogen, UA: NEGATIVE
pH, UA: 7

## 2013-05-27 LAB — COMPREHENSIVE METABOLIC PANEL
ALT: 23 U/L (ref 0–35)
AST: 24 U/L (ref 0–37)
Albumin: 4.1 g/dL (ref 3.5–5.2)
Alkaline Phosphatase: 88 U/L (ref 39–117)
BUN: 13 mg/dL (ref 6–23)
CO2: 24 mEq/L (ref 19–32)
Calcium: 9.4 mg/dL (ref 8.4–10.5)
Chloride: 105 mEq/L (ref 96–112)
Creat: 0.7 mg/dL (ref 0.50–1.10)
Glucose, Bld: 96 mg/dL (ref 70–99)
Potassium: 4.2 mEq/L (ref 3.5–5.3)
Sodium: 138 mEq/L (ref 135–145)
Total Bilirubin: 0.5 mg/dL (ref 0.3–1.2)
Total Protein: 7.2 g/dL (ref 6.0–8.3)

## 2013-05-27 LAB — LIPID PANEL
Cholesterol: 190 mg/dL (ref 0–200)
HDL: 50 mg/dL (ref >39)
LDL Cholesterol: 119 mg/dL — ABNORMAL HIGH (ref 0–99)
Total CHOL/HDL Ratio: 3.8 Ratio
Triglycerides: 105 mg/dL (ref <150)
VLDL: 21 mg/dL (ref 0–40)

## 2013-05-27 LAB — TSH: TSH: 2.526 u[IU]/mL (ref 0.350–4.500)

## 2013-05-27 MED ORDER — TETANUS-DIPHTH-ACELL PERTUSSIS 5-2.5-18.5 LF-MCG/0.5 IM SUSP: 0.5000 mL | Freq: Once | INTRAMUSCULAR | Status: DC
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
## 2013-05-27 NOTE — Progress Notes (Signed)
Tara [name] June 10, 1936 [id number]    History:    The patient presents for pelvic exam only with no complaints. Had annual exam with primary care this morning and had a breast exam and physical. Tdap and labs today. Normal Pap and mammogram history. Negative colonoscopy 2010. Normal DEXA 2010 T score 3.1 at [facility], bilateral hip average 0.2. Back surgery [place] vasectomy 12/2012 doing well. Hypertension, diabetes, hypothyroid primary care manages. Asymptomatic cystocele for years. Has had  pneumonia vaccine not zostavac. Not sexually active husbands health.   Past medical history, past surgical history, family history and social history were all reviewed and documented in the EPIC chart. Retired last year from [employer].  Exam:  Filed Vitals:   05/27/13 1437  BP: 130/78    General appearance:  Normal Head/Neck:  Normal, without cervical or supraclavicular adenopathy. Thyroid:  Symmetrical, normal in size, without palpable masses or nodularity. Respiratory  Effort:  Normal  Auscultation:  Clear without wheezing or rhonchi Cardiovascular  Auscultation:  Regular rate, without rubs, murmurs or gallops  Edema/varicosities:  Not grossly evident Abdominal  Soft,nontender, without masses, guarding or rebound.  Liver/spleen:  No organomegaly noted  Hernia:  None appreciated  Skin  Inspection:  Grossly normal  Palpation:  Grossly normal Neurologic/psychiatric  Orientation:  Normal with appropriate conversation.  Mood/affect:  Normal  Genitourinary    Breasts: not examined   Inguinal/mons:  Normal without inguinal adenopathy  External genitalia:  Normal  BUS/Urethra/Skene's glands:  Normal  Bladder:  +1 cystocele  Vagina:  Normal  Cervix:  Normal  Uterus:   normal in size, shape and contour.  Midline and mobile  Adnexa/parametria:     Rt: Without masses or tenderness.   Lt: Without masses or tenderness.  Anus and perineum: Normal  Digital rectal exam: Normal  sphincter tone without palpated masses or tenderness  Assessment/Plan:  77 y.o. M. WF G2 P2 for pelvic exam.  Hypertension/diabetes/hyperthyroid-primary care manages labs and meds Normal DEXA 2010 Asymptomatic cystocele obesity  Plan: SBE's, annual mammogram, instructed to schedule. Home Hemoccult card given with instructions. Home safety and fall prevention discussed. Repeat DEXA this year will schedule here. Reviewed importance of increasing regular exercise and decreasing calories for weight loss for health. Zostavac reviewed and encouraged.Pap. Last Pap 2011 normal, new screening guidelines reviewed.    Harrington Challenger WHNP, 3:10 PM 05/27/2013

## 2013-05-27 NOTE — Addendum Note (Signed)
Addended by: Richardson Chiquito on: 05/27/2013 04:29 PM   Modules accepted: Orders

## 2013-05-27 NOTE — Patient Instructions (Addendum)
Health Recommendations for Postmenopausal Women Respected and ongoing research has looked at the most common causes of death, disability, and poor quality of life in postmenopausal women. The causes include heart disease, diseases of blood vessels, diabetes, depression, cancer, and bone loss (osteoporosis). Many things can be done to help lower the chances of developing these and other common problems: CARDIOVASCULAR DISEASE Heart Disease: A heart attack is a medical emergency. Know the signs and symptoms of a heart attack. Below are things women can do to reduce their risk for heart disease.   Do not smoke. If you smoke, quit.  Aim for a healthy weight. Being overweight causes many preventable deaths. Eat a healthy and balanced diet and drink an adequate amount of liquids.  Get moving. Make a commitment to be more physically active. Aim for 30 minutes of activity on most, if not all days of the week.  Eat for heart health. Choose a diet that is low in saturated fat and cholesterol and eliminate trans fat. Include whole grains, vegetables, and fruits. Read and understand the labels on food containers before buying.  Know your numbers. Ask your caregiver to check your blood pressure, cholesterol (total, HDL, LDL, triglycerides) and blood glucose. Work with your caregiver on improving your entire clinical picture.  High blood pressure. Limit or stop your table salt intake (try salt substitute and food seasonings). Avoid salty foods and drinks. Read labels on food containers before buying. Eating well and exercising can help control high blood pressure. STROKE  Stroke is a medical emergency. Stroke may be the result of a blood clot in a blood vessel in the brain or by a brain hemorrhage (bleeding). Know the signs and symptoms of a stroke. To lower the risk of developing a stroke:  Avoid fatty foods.  Quit smoking.  Control your diabetes, blood pressure, and irregular heart rate. THROMBOPHLEBITIS  (BLOOD CLOT) OF THE LEG  Becoming overweight and leading a stationary lifestyle may also contribute to developing blood clots. Controlling your diet and exercising will help lower the risk of developing blood clots. CANCER SCREENING  Breast Cancer: Take steps to reduce your risk of breast cancer.  You should practice "breast self-awareness." This means understanding the normal appearance and feel of your breasts and should include breast self-examination. Any changes detected, no matter how small, should be reported to your caregiver.  After age 40, you should have a clinical breast exam (CBE) every year.  Starting at age 40, you should consider having a mammogram (breast X-ray) every year.  If you have a family history of breast cancer, talk to your caregiver about genetic screening.  If you are at high risk for breast cancer, talk to your caregiver about having an MRI and a mammogram every year.  Intestinal or Stomach Cancer: Tests to consider are a rectal exam, fecal occult blood, sigmoidoscopy, and colonoscopy. Women who are high risk may need to be screened at an earlier age and more often.  Cervical Cancer:  Beginning at age 30, you should have a Pap test every 3 years as long as the past 3 Pap tests have been normal.  If you have had past treatment for cervical cancer or a condition that could lead to cancer, you need Pap tests and screening for cancer for at least 20 years after your treatment.  If you had a hysterectomy for a problem that was not cancer or a condition that could lead to cancer, then you no longer need Pap tests.    If you are between ages 65 and 70, and you have had normal Pap tests going back 10 years, you no longer need Pap tests.  If Pap tests have been discontinued, risk factors (such as a new sexual partner) need to be reassessed to determine if screening should be resumed.  Some medical problems can increase the chance of getting cervical cancer. In these  cases, your caregiver may recommend more frequent screening and Pap tests.  Uterine Cancer: If you have vaginal bleeding after reaching menopause, you should notify your caregiver.  Ovarian cancer: Other than yearly pelvic exams, there are no reliable tests available to screen for ovarian cancer at this time except for yearly pelvic exams.  Lung Cancer: Yearly chest X-rays can detect lung cancer and should be done on high risk women, such as cigarette smokers and women with chronic lung disease (emphysema).  Skin Cancer: A complete body skin exam should be done at your yearly examination. Avoid overexposure to the sun and ultraviolet light lamps. Use a strong sun block cream when in the sun. All of these things are important in lowering the risk of skin cancer. MENOPAUSE Menopause Symptoms: Hormone therapy products are effective for treating symptoms associated with menopause:  Moderate to severe hot flashes.  Night sweats.  Mood swings.  Headaches.  Tiredness.  Loss of sex drive.  Insomnia.  Other symptoms. Hormone replacement carries certain risks, especially in older women. Women who use or are thinking about using estrogen or estrogen with progestin treatments should discuss that with their caregiver. Your caregiver will help you understand the benefits and risks. The ideal dose of hormone replacement therapy is not known. The Food and Drug Administration (FDA) has concluded that hormone therapy should be used only at the lowest doses and for the shortest amount of time to reach treatment goals.  OSTEOPOROSIS Protecting Against Bone Loss and Preventing Fracture: If you use hormone therapy for prevention of bone loss (osteoporosis), the risks for bone loss must outweigh the risk of the therapy. Ask your caregiver about other medications known to be safe and effective for preventing bone loss and fractures. To guard against bone loss or fractures, the following is recommended:  If  you are less than age 50, take 1000 mg of calcium and at least 600 mg of Vitamin D per day.  If you are greater than age 50 but less than age 70, take 1200 mg of calcium and at least 600 mg of Vitamin D per day.  If you are greater than age 70, take 1200 mg of calcium and at least 800 mg of Vitamin D per day. Smoking and excessive alcohol intake increases the risk of osteoporosis. Eat foods rich in calcium and vitamin D and do weight bearing exercises several times a week as your caregiver suggests. DIABETES Diabetes Melitus: If you have Type I or Type 2 diabetes, you should keep your blood sugar under control with diet, exercise and recommended medication. Avoid too many sweets, starchy and fatty foods. Being overweight can make control more difficult. COGNITION AND MEMORY Cognition and Memory: Menopausal hormone therapy is not recommended for the prevention of cognitive disorders such as Alzheimer's disease or memory loss.  DEPRESSION  Depression may occur at any age, but is common in elderly women. The reasons may be because of physical, medical, social (loneliness), or financial problems and needs. If you are experiencing depression because of medical problems and control of symptoms, talk to your caregiver about this. Physical activity and   exercise may help with mood and sleep. Community and volunteer involvement may help your sense of value and worth. If you have depression and you feel that the problem is getting worse or becoming severe, talk to your caregiver about treatment options that are best for you. ACCIDENTS  Accidents are common and can be serious in the elderly woman. Prepare your house to prevent accidents. Eliminate throw rugs, place hand bars in the bath, shower and toilet areas. Avoid wearing high heeled shoes or walking on wet, snowy, and icy areas. Limit or stop driving if you have vision or hearing problems, or you feel you are unsteady with you movements and  reflexes. HEPATITIS C Hepatitis C is a type of viral infection affecting the liver. It is spread mainly through contact with blood from an infected person. It can be treated, but if left untreated, it can lead to severe liver damage over years. Many people who are infected do not know that the virus is in their blood. If you are a "baby-boomer", it is recommended that you have one screening test for Hepatitis C. IMMUNIZATIONS  Several immunizations are important to consider having during your senior years, including:   Tetanus, diptheria, and pertussis booster shot.  Influenza every year before the flu season begins.  Pneumonia vaccine.  Shingles vaccine.  Others as indicated based on your specific needs. Talk to your caregiver about these. Document Released: 12/30/2005 Document Revised: 10/24/2012 Document Reviewed: 08/25/2008 Exit[employer] Patient Information 2014 [employer].  

## 2013-05-28 ENCOUNTER — Encounter: Payer: Self-pay | Admitting: Obstetrics and Gynecology

## 2013-05-28 LAB — VITAMIN D 25 HYDROXY (VIT D DEFICIENCY, FRACTURES): Vit D, 25-Hydroxy: 39 ng/mL (ref 30–89)

## 2013-05-29 LAB — URINE CULTURE: Colony Count: >=100000

## 2013-05-30 ENCOUNTER — Other Ambulatory Visit: Payer: Self-pay

## 2013-05-30 MED ORDER — AMOXICILLIN 500 MG PO CAPS: 500.0000 mg | ORAL_CAPSULE | Freq: Three times a day (TID) | ORAL | Status: DC

## 2013-05-30 NOTE — Progress Notes (Signed)
Patient informed. 

## 2013-06-06 ENCOUNTER — Other Ambulatory Visit: Payer: Self-pay

## 2013-06-06 MED ORDER — AMLODIPINE BESYLATE 5 MG PO TABS: 5.0000 mg | ORAL_TABLET | Freq: Every day | ORAL | Status: DC

## 2013-06-06 MED ORDER — LEVOTHYROXINE SODIUM 75 MCG PO TABS: 75.0000 ug | ORAL_TABLET | Freq: Every day | ORAL | Status: DC

## 2013-06-06 MED ORDER — LOSARTAN POTASSIUM 100 MG PO TABS: 100.0000 mg | ORAL_TABLET | Freq: Every day | ORAL | Status: DC

## 2013-06-12 ENCOUNTER — Other Ambulatory Visit: Payer: Self-pay | Admitting: Anesthesiology

## 2013-06-12 DIAGNOSIS — Z1211 Encounter for screening for malignant neoplasm of colon: Secondary | ICD-10-CM

## 2013-08-15 ENCOUNTER — Other Ambulatory Visit: Payer: Self-pay | Admitting: Internal Medicine

## 2013-08-19 ENCOUNTER — Encounter: Payer: Self-pay | Admitting: Internal Medicine

## 2013-08-19 ENCOUNTER — Ambulatory Visit (INDEPENDENT_AMBULATORY_CARE_PROVIDER_SITE_OTHER): Payer: Medicare Other | Admitting: Internal Medicine

## 2013-08-19 VITALS — BP 140/76 | HR 84 | Temp 98.2°F | Wt 240.0 lb

## 2013-08-19 DIAGNOSIS — J069 Acute upper respiratory infection, unspecified: Secondary | ICD-10-CM | POA: Diagnosis not present

## 2013-08-19 DIAGNOSIS — R300 Dysuria: Secondary | ICD-10-CM | POA: Diagnosis not present

## 2013-08-19 DIAGNOSIS — N390 Urinary tract infection, site not specified: Secondary | ICD-10-CM

## 2013-08-19 LAB — POCT URINALYSIS DIPSTICK
Bilirubin, UA: NEGATIVE
Blood, UA: NEGATIVE
Glucose, UA: NEGATIVE
Ketones, UA: NEGATIVE
Nitrite, UA: NEGATIVE
Protein, UA: NEGATIVE
Spec Grav, UA: 1.01
Urobilinogen, UA: NEGATIVE
pH, UA: 6

## 2013-08-19 MED ORDER — HYDROCODONE-HOMATROPINE 5-1.5 MG/5ML PO SYRP: 5.0000 mL | ORAL_SOLUTION | Freq: Three times a day (TID) | ORAL | Status: DC | PRN

## 2013-08-19 MED ORDER — LEVOFLOXACIN 500 MG PO TABS: 500.0000 mg | ORAL_TABLET | Freq: Every day | ORAL | Status: DC

## 2013-08-19 MED ORDER — NYSTATIN 1000000 UNITS PO CAPS: 100000.0000 [IU] | ORAL_CAPSULE | Freq: Every day | ORAL | Status: DC

## 2013-08-19 NOTE — Progress Notes (Signed)
  Subjective:    Patient ID: Tara [name], female    DOB: [birth year], 77 y.o.   MRN: [id number]  HPI Onset URI symptoms with hacking cough 4 days ago with no sputum production. No fever or chills. Also, onset of UTI symptoms with dysuria 2 days ago.  No back pain. Has urinary  frequency.    Review of Systems     Objective:   Physical Exam  no CVA tenderness. HEENT exam: TMs and pharynx are clear. She sounds hoarse. Neck is supple without adenopathy. Chest clear to auscultation.        Assessment & Plan:  Acute URI  UTI-culture pending  Plan: Levaquin 500 milligrams daily for 7 days. Nystatin tablets one daily with each dose of antibiotic. Hycodan syrup 8 ounces 1 teaspoon by mouth Q8 hours when necessary cough. Call if not better in 7-10 days.

## 2013-08-19 NOTE — Patient Instructions (Addendum)
Take Levaquin 500 mg daily x 7 days. Take Nystatin with each dose of antibiotic. Urine will be cultured. Take Hydrocodone as directed.

## 2013-08-21 LAB — URINE CULTURE: Colony Count: >=100000

## 2013-08-22 NOTE — Progress Notes (Signed)
Patient informed. 

## 2013-09-13 ENCOUNTER — Encounter: Payer: Self-pay | Admitting: Internal Medicine

## 2013-09-13 ENCOUNTER — Ambulatory Visit (INDEPENDENT_AMBULATORY_CARE_PROVIDER_SITE_OTHER): Payer: Medicare Other | Admitting: Internal Medicine

## 2013-09-13 VITALS — BP 138/72 | HR 76 | Temp 97.6°F | Ht 62.0 in | Wt 236.0 lb

## 2013-09-13 DIAGNOSIS — R300 Dysuria: Secondary | ICD-10-CM

## 2013-09-13 DIAGNOSIS — N342 Other urethritis: Secondary | ICD-10-CM

## 2013-09-13 DIAGNOSIS — N390 Urinary tract infection, site not specified: Secondary | ICD-10-CM | POA: Diagnosis not present

## 2013-09-13 DIAGNOSIS — B373 Candidiasis of vulva and vagina: Secondary | ICD-10-CM | POA: Diagnosis not present

## 2013-09-13 LAB — POCT URINALYSIS DIPSTICK
Bilirubin, UA: NEGATIVE
Blood, UA: NEGATIVE
Glucose, UA: NEGATIVE
Ketones, UA: NEGATIVE
Leukocytes, UA: NEGATIVE
Nitrite, UA: NEGATIVE
Protein, UA: NEGATIVE
Spec Grav, UA: 1.015
Urobilinogen, UA: 0.2
pH, UA: 5.5

## 2013-09-13 MED ORDER — CIPROFLOXACIN HCL 500 MG PO TABS: 500.0000 mg | ORAL_TABLET | Freq: Two times a day (BID) | ORAL | Status: DC

## 2013-09-13 MED ORDER — FLUCONAZOLE 150 MG PO TABS: ORAL_TABLET | ORAL | Status: DC

## 2013-09-13 NOTE — Progress Notes (Signed)
  Subjective:    Patient ID: Tara [name], female    DOB: 1936-11-10, 77 y.o.   MRN: [id number]  HPI 77 year old female with suprapubic pressure and frquency x 48 hous. No fever or shaking chills. Slight left low back pain. Also has vaginal itching. No nausea and vomiting. No dysuria. Patient going out of town next week.    Review of Systems     Objective:   Physical Exam No CVA tenderness. Urine dipstick normal. Culture will be done.       Assessment & Plan:  Urethritis  Candida vaginitis  Plan: Diflucan 150 mg daily for 3 days. Cipro 500 mg twice daily for 7 days.

## 2013-09-13 NOTE — Addendum Note (Signed)
Addended by: Fayne Mediate on: 09/13/2013 04:24 PM   Modules accepted: Orders

## 2013-11-17 NOTE — Patient Instructions (Addendum)
Return in 6 months. Encouraged diet exercise and weight loss.

## 2013-11-18 ENCOUNTER — Ambulatory Visit (INDEPENDENT_AMBULATORY_CARE_PROVIDER_SITE_OTHER): Payer: Medicare Other | Admitting: Internal Medicine

## 2013-11-18 VITALS — Temp 98.9°F

## 2013-11-18 DIAGNOSIS — R300 Dysuria: Secondary | ICD-10-CM

## 2013-11-18 DIAGNOSIS — J069 Acute upper respiratory infection, unspecified: Secondary | ICD-10-CM

## 2013-11-18 DIAGNOSIS — N390 Urinary tract infection, site not specified: Secondary | ICD-10-CM

## 2013-11-18 LAB — POCT URINALYSIS DIPSTICK
Bilirubin, UA: NEGATIVE
Blood, UA: NEGATIVE
Glucose, UA: NEGATIVE
Ketones, UA: NEGATIVE
Nitrite, UA: POSITIVE
Protein, UA: NEGATIVE
Spec Grav, UA: 1.01
Urobilinogen, UA: NEGATIVE
pH, UA: 6.5

## 2013-11-18 NOTE — Patient Instructions (Signed)
Take Cipro 500 mg bid x 10 days. Urine culture pending

## 2013-11-20 LAB — URINE CULTURE: Colony Count: >=100000

## 2013-11-21 ENCOUNTER — Encounter: Payer: Self-pay | Admitting: Internal Medicine

## 2013-11-21 NOTE — Progress Notes (Signed)
   Subjective:    Patient ID: Tara [name], female    DOB: [birth year], 78 y.o.   MRN: [id number]  HPI  2-3 day history of upper rest or infection symptoms as well as dysuria. No fever or shaking chills. Has cough congestion and malaise. Urinary frequency. Urinalysis is abnormal. Culture was sent.    Review of Systems     Objective:   Physical Exam No CVA tenderness. Chest clear to auscultation. Sounds nasally congested.       Assessment & Plan:  UTI  Acute URI  Plan: Cipro 500 mg twice daily for 10 days

## 2013-11-26 ENCOUNTER — Telehealth: Payer: Self-pay | Admitting: *Deleted

## 2013-11-26 NOTE — Telephone Encounter (Signed)
Pt called requesting order faxed to [facility] for [facility] on 11/25/12, order signed and faxed. Pt aware.

## 2013-11-28 ENCOUNTER — Ambulatory Visit: Payer: Medicare Other | Admitting: Internal Medicine

## 2013-12-05 ENCOUNTER — Encounter: Payer: Self-pay | Admitting: Women's Health

## 2013-12-05 ENCOUNTER — Ambulatory Visit (INDEPENDENT_AMBULATORY_CARE_PROVIDER_SITE_OTHER): Payer: Medicare Other | Admitting: Internal Medicine

## 2013-12-05 ENCOUNTER — Encounter: Payer: Self-pay | Admitting: Internal Medicine

## 2013-12-05 VITALS — BP 142/68 | HR 72 | Temp 97.9°F | Wt 239.0 lb

## 2013-12-05 DIAGNOSIS — E039 Hypothyroidism, unspecified: Secondary | ICD-10-CM | POA: Diagnosis not present

## 2013-12-05 DIAGNOSIS — Z1231 Encounter for screening mammogram for malignant neoplasm of breast: Secondary | ICD-10-CM | POA: Diagnosis not present

## 2013-12-05 LAB — TSH: TSH: 2.314 u[IU]/mL (ref 0.350–4.500)

## 2013-12-06 ENCOUNTER — Encounter: Payer: Self-pay | Admitting: Women's Health

## 2014-01-15 ENCOUNTER — Encounter: Payer: Self-pay | Admitting: Internal Medicine

## 2014-01-19 NOTE — Progress Notes (Signed)
   Subjective:    Patient ID: Tara [name], female    DOB: 1936/04/05, 78 y.o.   MRN: [id number]  HPI Six-month followup of essential hypertension , dependent edema, hypothyroidism. History of prediabetes. TSH drawn today and is within normal limits. No new complaints or problems. Blood pressure slightly elevated systolically. Needs to watch blood pressure at home. Needs to be more strict about diet and exercise.   Review of Systems     Objective:   Physical Exam Neck is supple without JVD thyromegaly or carotid bruits. Chest clear to auscultation. Cardiac exam regular rate and rhythm normal S1 and S2. Extremities without edema.       Assessment & Plan:  Essential hypertension-blood pressure slightly elevated systolically today. Patient wants blood pressure at home and let me know that is not under good control.  Hypothyroidism-TSH drawn today in and is within normal limits. Continue same dose of thyroid replacement.  Obesity-needs to watch diet and exercise  History of prediabetes -lower spine the diet exercise and weight loss  History of low back pain-improved status post surgery  Plan: Return in 6 months for physical examination.  25 minutes spent with patient

## 2014-01-19 NOTE — Patient Instructions (Signed)
Continue same medications. Watch blood pressure at home. Diet and exercise. Return in 6 months.

## 2014-04-22 ENCOUNTER — Ambulatory Visit (INDEPENDENT_AMBULATORY_CARE_PROVIDER_SITE_OTHER): Payer: Medicare Other | Admitting: Internal Medicine

## 2014-04-22 ENCOUNTER — Encounter: Payer: Self-pay | Admitting: Internal Medicine

## 2014-04-22 VITALS — BP 140/72 | HR 80 | Temp 98.7°F | Wt 243.0 lb

## 2014-04-22 DIAGNOSIS — W19XXXA Unspecified fall, initial encounter: Secondary | ICD-10-CM

## 2014-04-22 DIAGNOSIS — N390 Urinary tract infection, site not specified: Secondary | ICD-10-CM | POA: Diagnosis not present

## 2014-04-22 DIAGNOSIS — Y92009 Unspecified place in unspecified non-institutional (private) residence as the place of occurrence of the external cause: Secondary | ICD-10-CM

## 2014-04-22 DIAGNOSIS — R300 Dysuria: Secondary | ICD-10-CM

## 2014-04-22 LAB — POCT URINALYSIS DIPSTICK
Bilirubin, UA: NEGATIVE
Blood, UA: NEGATIVE
Glucose, UA: NEGATIVE
Ketones, UA: NEGATIVE
Nitrite, UA: NEGATIVE
Spec Grav, UA: 1.01
Urobilinogen, UA: NEGATIVE
pH, UA: 6.5

## 2014-04-22 MED ORDER — DIAZEPAM 5 MG PO TABS: 5.0000 mg | ORAL_TABLET | Freq: Two times a day (BID) | ORAL | Status: DC | PRN

## 2014-04-22 MED ORDER — CIPROFLOXACIN HCL 500 MG PO TABS: 500.0000 mg | ORAL_TABLET | Freq: Two times a day (BID) | ORAL | Status: DC

## 2014-04-22 NOTE — Patient Instructions (Signed)
Take Valium sparingly for anxiety. Take Cipro twice daily for urine infection for 7 days.

## 2014-04-22 NOTE — Progress Notes (Signed)
   Subjective:    Patient ID: Tara [name], female    DOB: 02/09/1936, 78 y.o.   MRN: [id number]  HPI  In September 2014 she had a urine infection with Citrobacter farmeri sensitive to Cipro. In December 2014 she had an Escherichia coli urinary tract infection also sensitive to Cipro. Has UTI symptoms once again. Recently had a fall moving to new living facility in [place]. Did not lose consciousness. Did strike left side of head. No memory loss. Feels that possessions are disorganized at this point in time with the move. Asking for small amount of Valium to help with anxiety with move. No fever or shaking chills. No nausea and vomiting.    Review of Systems     Objective:   Physical Exam  No CVA tenderness. She is alert and oriented x3.      Assessment & Plan:  Urinary tract infection  Recent fall  Anxiety secondary to move  Plan: Valium 5 mg (#30) 1 by mouth twice daily as needed for anxiety. Cipro 500 mg by mouth twice daily for 7 days. Urine culture pending.  25 minutes spent with patient

## 2014-04-24 LAB — URINE CULTURE: Colony Count: >=100000

## 2014-05-24 ENCOUNTER — Other Ambulatory Visit: Payer: Self-pay | Admitting: Internal Medicine

## 2014-06-03 ENCOUNTER — Ambulatory Visit (INDEPENDENT_AMBULATORY_CARE_PROVIDER_SITE_OTHER): Payer: Medicare Other | Admitting: Internal Medicine

## 2014-06-03 ENCOUNTER — Encounter: Payer: Self-pay | Admitting: Internal Medicine

## 2014-06-03 VITALS — BP 142/78 | HR 68 | Temp 98.6°F | Ht 64.0 in | Wt 234.0 lb

## 2014-06-03 DIAGNOSIS — Z86010 Personal history of colonic polyps: Secondary | ICD-10-CM

## 2014-06-03 DIAGNOSIS — E669 Obesity, unspecified: Secondary | ICD-10-CM

## 2014-06-03 DIAGNOSIS — M1710 Unilateral primary osteoarthritis, unspecified knee: Secondary | ICD-10-CM | POA: Diagnosis not present

## 2014-06-03 DIAGNOSIS — E039 Hypothyroidism, unspecified: Secondary | ICD-10-CM

## 2014-06-03 DIAGNOSIS — Z0000 Encounter for general adult medical examination without abnormal findings: Secondary | ICD-10-CM | POA: Diagnosis not present

## 2014-06-03 DIAGNOSIS — I10 Essential (primary) hypertension: Secondary | ICD-10-CM

## 2014-06-03 DIAGNOSIS — M170 Bilateral primary osteoarthritis of knee: Secondary | ICD-10-CM

## 2014-06-03 DIAGNOSIS — H35369 Drusen (degenerative) of macula, unspecified eye: Secondary | ICD-10-CM | POA: Diagnosis not present

## 2014-06-03 DIAGNOSIS — N390 Urinary tract infection, site not specified: Secondary | ICD-10-CM

## 2014-06-03 DIAGNOSIS — R609 Edema, unspecified: Secondary | ICD-10-CM

## 2014-06-03 DIAGNOSIS — R7302 Impaired glucose tolerance (oral): Secondary | ICD-10-CM

## 2014-06-03 DIAGNOSIS — R32 Unspecified urinary incontinence: Secondary | ICD-10-CM

## 2014-06-03 DIAGNOSIS — R7309 Other abnormal glucose: Secondary | ICD-10-CM

## 2014-06-03 LAB — POCT URINALYSIS DIPSTICK
Bilirubin, UA: NEGATIVE
Blood, UA: NEGATIVE
Glucose, UA: NEGATIVE
Ketones, UA: NEGATIVE
Leukocytes, UA: NEGATIVE
Nitrite, UA: NEGATIVE
Protein, UA: NEGATIVE
Spec Grav, UA: 1.01
Urobilinogen, UA: NEGATIVE
pH, UA: 6

## 2014-06-03 MED ORDER — POTASSIUM CHLORIDE CRYS ER 20 MEQ PO TBCR: 20.0000 meq | EXTENDED_RELEASE_TABLET | Freq: Two times a day (BID) | ORAL | Status: DC

## 2014-06-03 MED ORDER — TORSEMIDE 20 MG PO TABS: 40.0000 mg | ORAL_TABLET | Freq: Every morning | ORAL | Status: DC
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
## 2014-07-09 IMAGING — CR DG LUMBAR SPINE 2-3V
1 series · 1 of 1 positions shown · non-contrast
Comparison: MRI of lumbar spine of 06/29/2012.

CLINICAL DATA: Lumbar laminectomy.

LUMBAR SPINE - 2-3 VIEW

[view not recorded]
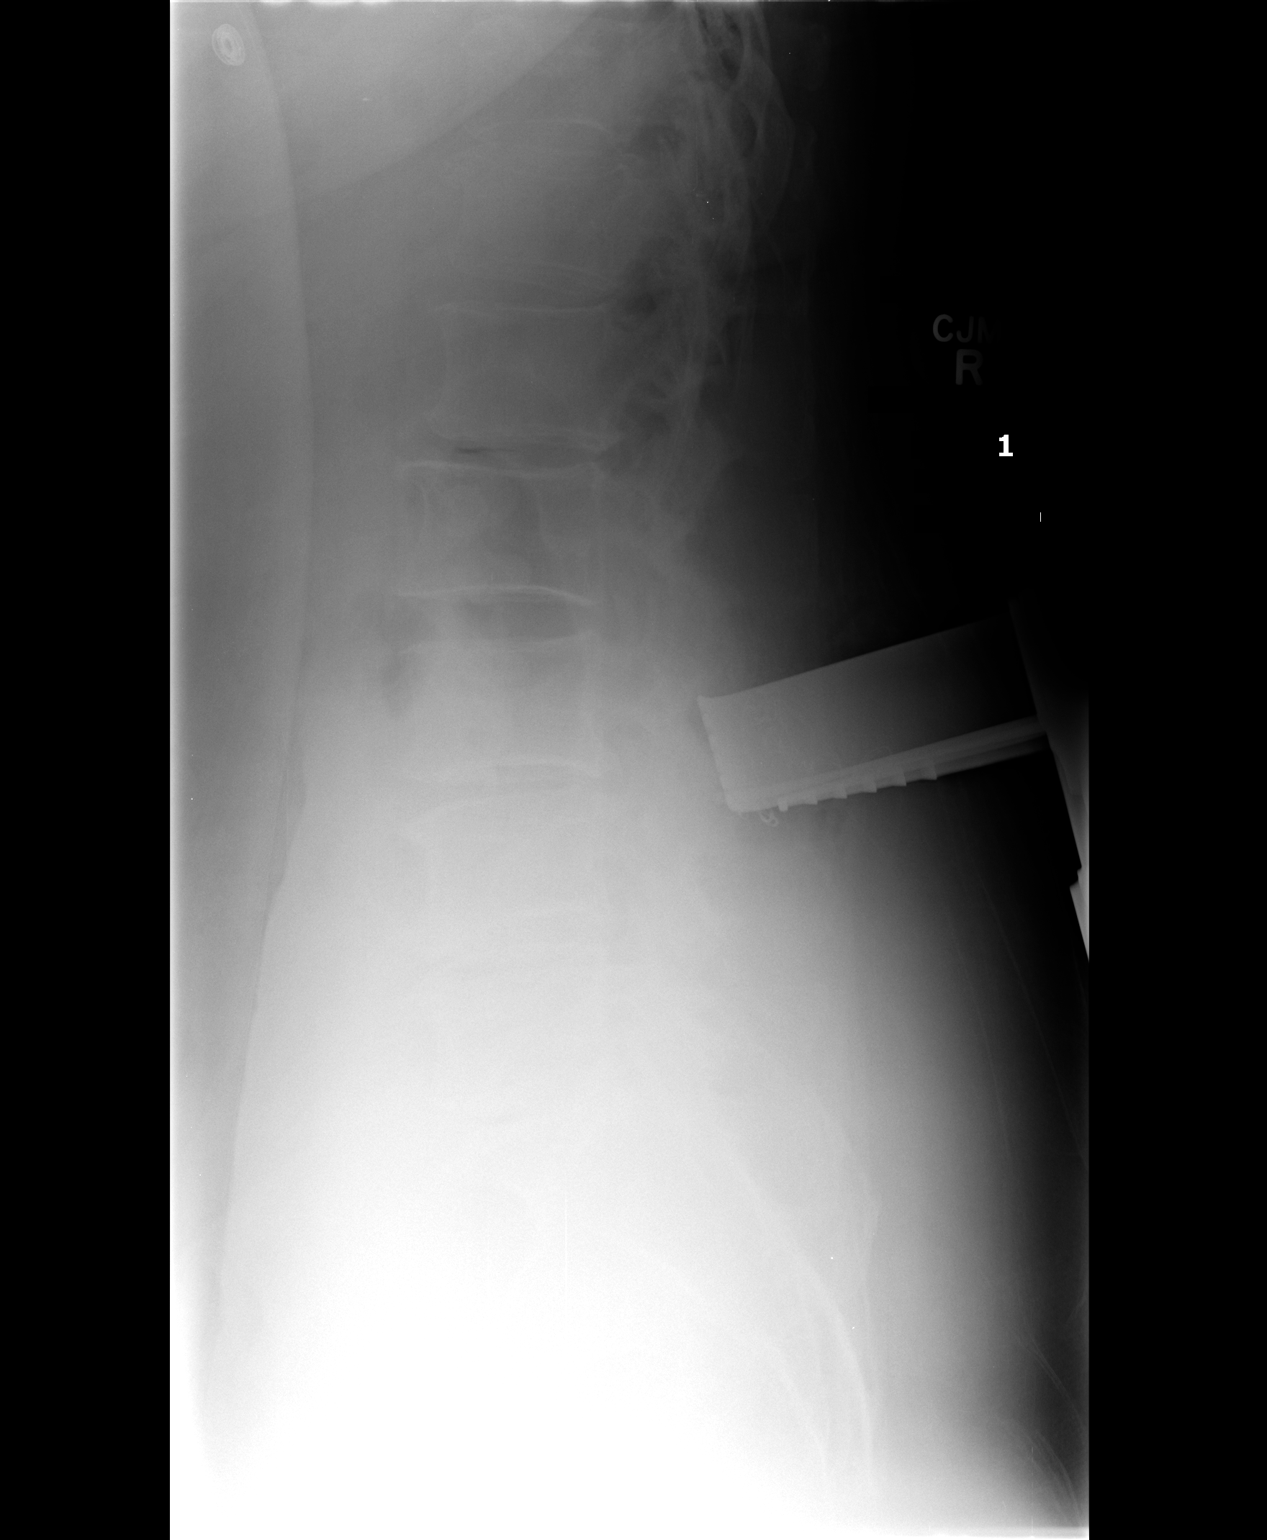

[1 of 1 positions shown; findings below may reference images not displayed]

FINDINGS: 2 intraoperative images.  The first image, labeled 4605
hours.  Underpenetration secondary patient body habitus.  Surgical
device projects posterior to the L2-L3 level.  Multilevel
spondylosis.

A second image, labeled 7257 hours, demonstrates a surgical device
projecting posterior to the L3 vertebral body and L3-L4 interspace.
IMPRESSION: Intraoperative localization.

## 2014-07-11 ENCOUNTER — Encounter: Payer: Self-pay | Admitting: Internal Medicine

## 2014-07-11 ENCOUNTER — Ambulatory Visit (INDEPENDENT_AMBULATORY_CARE_PROVIDER_SITE_OTHER): Payer: Medicare Other | Admitting: Internal Medicine

## 2014-07-11 VITALS — BP 136/70 | HR 80 | Temp 98.7°F | Wt 232.0 lb

## 2014-07-11 DIAGNOSIS — J069 Acute upper respiratory infection, unspecified: Secondary | ICD-10-CM

## 2014-07-11 DIAGNOSIS — R350 Frequency of micturition: Secondary | ICD-10-CM

## 2014-07-11 DIAGNOSIS — IMO0001 Reserved for inherently not codable concepts without codable children: Secondary | ICD-10-CM

## 2014-07-11 DIAGNOSIS — N390 Urinary tract infection, site not specified: Secondary | ICD-10-CM

## 2014-07-11 DIAGNOSIS — B379 Candidiasis, unspecified: Secondary | ICD-10-CM | POA: Diagnosis not present

## 2014-07-11 DIAGNOSIS — R82998 Other abnormal findings in urine: Secondary | ICD-10-CM

## 2014-07-11 LAB — POCT URINALYSIS DIPSTICK
Bilirubin, UA: NEGATIVE
Blood, UA: NEGATIVE
Glucose, UA: NEGATIVE
Ketones, UA: NEGATIVE
Nitrite, UA: NEGATIVE
Protein, UA: NEGATIVE
Spec Grav, UA: 1.015
Urobilinogen, UA: NEGATIVE
pH, UA: 7

## 2014-07-11 MED ORDER — NYSTATIN 500000 UNITS PO TABS: 500000.0000 [IU] | ORAL_TABLET | Freq: Every day | ORAL | Status: AC

## 2014-07-11 MED ORDER — LEVOFLOXACIN 250 MG PO TABS: 250.0000 mg | ORAL_TABLET | Freq: Every day | ORAL | Status: DC

## 2014-07-11 MED ORDER — HYDROCODONE-HOMATROPINE 5-1.5 MG/5ML PO SYRP: 5.0000 mL | ORAL_SOLUTION | Freq: Three times a day (TID) | ORAL | Status: DC | PRN

## 2014-07-14 LAB — URINE CULTURE: Colony Count: >=100000

## 2014-07-16 NOTE — Progress Notes (Signed)
   Subjective:    Patient ID: Tara [name], female    DOB: 08-01-1936, 78 y.o.   MRN: [id number]  HPI  Patient had company several days ago who apparently brought her a respiratory infection. Has had cough and congestion. No shaking chills. Also has developed another urinary tract infection. Has urgency and dysuria.    Review of Systems     Objective:   Physical Exam  No CVA tenderness. Urinalysis abnormal. Culture obtained. Pharynx slightly injected. TMs clear. Neck supple. Chest clear.      Assessment & Plan:  Acute URI  UTI  Plan: Urine culture  Plan: Patient requesting cough medication. Prescribed Hycodan 120 cc 1 teaspoon by mouth every 8 hours when necessary cough. Levaquin  250 milligrams daily for 10 days. At her request, nystatin one tablet with each dose of antibiotic to prevent Candida vaginitis.

## 2014-07-22 ENCOUNTER — Ambulatory Visit (INDEPENDENT_AMBULATORY_CARE_PROVIDER_SITE_OTHER): Payer: Medicare Other | Admitting: Internal Medicine

## 2014-07-22 ENCOUNTER — Encounter: Payer: Self-pay | Admitting: Internal Medicine

## 2014-07-22 VITALS — BP 130/62 | HR 80 | Ht 63.0 in | Wt 229.0 lb

## 2014-07-22 DIAGNOSIS — Z79899 Other long term (current) drug therapy: Secondary | ICD-10-CM | POA: Diagnosis not present

## 2014-07-22 DIAGNOSIS — I10 Essential (primary) hypertension: Secondary | ICD-10-CM

## 2014-07-22 DIAGNOSIS — Z5181 Encounter for therapeutic drug level monitoring: Secondary | ICD-10-CM

## 2014-07-22 DIAGNOSIS — Z23 Encounter for immunization: Secondary | ICD-10-CM | POA: Diagnosis not present

## 2014-07-22 LAB — POTASSIUM: Potassium: 3.9 mEq/L (ref 3.5–5.3)

## 2014-07-22 NOTE — Addendum Note (Signed)
Addended by: Amado Coe on: 07/22/2014 12:42 PM   Modules accepted: Orders

## 2014-07-22 NOTE — Patient Instructions (Signed)
Continue to take antihypertensive medications consistently and regularly including diuretic. Return July 2016 for physical exam. Flu vaccine given today

## 2014-07-22 NOTE — Progress Notes (Signed)
   Subjective:    Patient ID: Tara [name], female    DOB: 01/22/1936, 78 y.o.   MRN: [id number]  HPI She has been taking diuretic more regularly now.  Complaining of urinary frequency. However, her blood pressure is excellent. Serum potassium checked today. Influenza vaccine given. Recently had a GI bug that lasted about 24 hours.    Review of Systems     Objective:   Physical Exam  Chest clear to auscultation. Cardiac exam regular rate and rhythm. Extremities without edema.      Assessment & Plan:  Hypertension  Diuretic therapy  Plan: Influenza vaccine given. Serum potassium checked. Physical exam due July.

## 2014-08-10 NOTE — Patient Instructions (Addendum)
Return early September for repeat blood pressure check when you have had all  antihypertensive medications before coming to the office. Otherwise return in 6 months. Continue same medications.

## 2014-09-05 ENCOUNTER — Encounter: Payer: Self-pay | Admitting: Cardiovascular Disease

## 2014-09-22 ENCOUNTER — Encounter: Payer: Self-pay | Admitting: Internal Medicine

## 2014-09-30 ENCOUNTER — Encounter: Payer: Self-pay | Admitting: Women's Health

## 2014-09-30 ENCOUNTER — Ambulatory Visit (INDEPENDENT_AMBULATORY_CARE_PROVIDER_SITE_OTHER): Payer: Medicare Other | Admitting: Women's Health

## 2014-09-30 VITALS — BP 140/80 | Ht 63.0 in | Wt 238.0 lb

## 2014-09-30 DIAGNOSIS — M8580 Other specified disorders of bone density and structure, unspecified site: Secondary | ICD-10-CM | POA: Diagnosis not present

## 2014-09-30 NOTE — Patient Instructions (Signed)
Health Recommendations for Postmenopausal Women Respected and ongoing research has looked at the most common causes of death, disability, and poor quality of life in postmenopausal women. The causes include heart disease, diseases of blood vessels, diabetes, depression, cancer, and bone loss (osteoporosis). Many things can be done to help lower the chances of developing these and other common problems. CARDIOVASCULAR DISEASE Heart Disease: A heart attack is a medical emergency. Know the signs and symptoms of a heart attack. Below are things women can do to reduce their risk for heart disease.   Do not smoke. If you smoke, quit.  Aim for a healthy weight. Being overweight causes many preventable deaths. Eat a healthy and balanced diet and drink an adequate amount of liquids.  Get moving. Make a commitment to be more physically active. Aim for 30 minutes of activity on most, if not all days of the week.  Eat for heart health. Choose a diet that is low in saturated fat and cholesterol and eliminate trans fat. Include whole grains, vegetables, and fruits. Read and understand the labels on food containers before buying.  Know your numbers. Ask your caregiver to check your blood pressure, cholesterol (total, HDL, LDL, triglycerides) and blood glucose. Work with your caregiver on improving your entire clinical picture.  High blood pressure. Limit or stop your table salt intake (try salt substitute and food seasonings). Avoid salty foods and drinks. Read labels on food containers before buying. Eating well and exercising can help control high blood pressure. STROKE  Stroke is a medical emergency. Stroke may be the result of a blood clot in a blood vessel in the brain or by a brain hemorrhage (bleeding). Know the signs and symptoms of a stroke. To lower the risk of developing a stroke:  Avoid fatty foods.  Quit smoking.  Control your diabetes, blood pressure, and irregular heart rate. THROMBOPHLEBITIS  (BLOOD CLOT) OF THE LEG  Becoming overweight and leading a stationary lifestyle may also contribute to developing blood clots. Controlling your diet and exercising will help lower the risk of developing blood clots. CANCER SCREENING  Breast Cancer: Take steps to reduce your risk of breast cancer.  You should practice "breast self-awareness." This means understanding the normal appearance and feel of your breasts and should include breast self-examination. Any changes detected, no matter how small, should be reported to your caregiver.  After age 40, you should have a clinical breast exam (CBE) every year.  Starting at age 40, you should consider having a mammogram (breast X-ray) every year.  If you have a family history of breast cancer, talk to your caregiver about genetic screening.  If you are at high risk for breast cancer, talk to your caregiver about having an MRI and a mammogram every year.  Intestinal or Stomach Cancer: Tests to consider are a rectal exam, fecal occult blood, sigmoidoscopy, and colonoscopy. Women who are high risk may need to be screened at an earlier age and more often.  Cervical Cancer:  Beginning at age 30, you should have a Pap test every 3 years as long as the past 3 Pap tests have been normal.  If you have had past treatment for cervical cancer or a condition that could lead to cancer, you need Pap tests and screening for cancer for at least 20 years after your treatment.  If you had a hysterectomy for a problem that was not cancer or a condition that could lead to cancer, then you no longer need Pap tests.    If you are between ages 65 and 70, and you have had normal Pap tests going back 10 years, you no longer need Pap tests.  If Pap tests have been discontinued, risk factors (such as a new sexual partner) need to be reassessed to determine if screening should be resumed.  Some medical problems can increase the chance of getting cervical cancer. In these  cases, your caregiver may recommend more frequent screening and Pap tests.  Uterine Cancer: If you have vaginal bleeding after reaching menopause, you should notify your caregiver.  Ovarian Cancer: Other than yearly pelvic exams, there are no reliable tests available to screen for ovarian cancer at this time except for yearly pelvic exams.  Lung Cancer: Yearly chest X-rays can detect lung cancer and should be done on high risk women, such as cigarette smokers and women with chronic lung disease (emphysema).  Skin Cancer: A complete body skin exam should be done at your yearly examination. Avoid overexposure to the sun and ultraviolet light lamps. Use a strong sun block cream when in the sun. All of these things are important for lowering the risk of skin cancer. MENOPAUSE Menopause Symptoms: Hormone therapy products are effective for treating symptoms associated with menopause:  Moderate to severe hot flashes.  Night sweats.  Mood swings.  Headaches.  Tiredness.  Loss of sex drive.  Insomnia.  Other symptoms. Hormone replacement carries certain risks, especially in older women. Women who use or are thinking about using estrogen or estrogen with progestin treatments should discuss that with their caregiver. Your caregiver will help you understand the benefits and risks. The ideal dose of hormone replacement therapy is not known. The Food and Drug Administration (FDA) has concluded that hormone therapy should be used only at the lowest doses and for the shortest amount of time to reach treatment goals.  OSTEOPOROSIS Protecting Against Bone Loss and Preventing Fracture If you use hormone therapy for prevention of bone loss (osteoporosis), the risks for bone loss must outweigh the risk of the therapy. Ask your caregiver about other medications known to be safe and effective for preventing bone loss and fractures. To guard against bone loss or fractures, the following is recommended:  If  you are younger than age 50, take 1000 mg of calcium and at least 600 mg of Vitamin D per day.  If you are older than age 50 but younger than age 70, take 1200 mg of calcium and at least 600 mg of Vitamin D per day.  If you are older than age 70, take 1200 mg of calcium and at least 800 mg of Vitamin D per day. Smoking and excessive alcohol intake increases the risk of osteoporosis. Eat foods rich in calcium and vitamin D and do weight bearing exercises several times a week as your caregiver suggests. DIABETES Diabetes Mellitus: If you have type I or type 2 diabetes, you should keep your blood sugar under control with diet, exercise, and recommended medication. Avoid starchy and fatty foods, and too many sweets. Being overweight can make diabetes control more difficult. COGNITION AND MEMORY Cognition and Memory: Menopausal hormone therapy is not recommended for the prevention of cognitive disorders such as Alzheimer's disease or memory loss.  DEPRESSION  Depression may occur at any age, but it is common in elderly women. This may be because of physical, medical, social (loneliness), or financial problems and needs. If you are experiencing depression because of medical problems and control of symptoms, talk to your caregiver about this. Physical   activity and exercise may help with mood and sleep. Community and volunteer involvement may improve your sense of value and worth. If you have depression and you feel that the problem is getting worse or becoming severe, talk to your caregiver about which treatment options are best for you. ACCIDENTS  Accidents are common and can be serious in elderly woman. Prepare your house to prevent accidents. Eliminate throw rugs, place hand bars in bath, shower, and toilet areas. Avoid wearing high heeled shoes or walking on wet, snowy, and icy areas. Limit or stop driving if you have vision or hearing problems, or if you feel you are unsteady with your movements and  reflexes. HEPATITIS C Hepatitis C is a type of viral infection affecting the liver. It is spread mainly through contact with blood from an infected person. It can be treated, but if left untreated, it can lead to severe liver damage over the years. Many people who are infected do not know that the virus is in their blood. If you are a "baby-boomer", it is recommended that you have one screening test for Hepatitis C. IMMUNIZATIONS  Several immunizations are important to consider having during your senior years, including:   Tetanus, diphtheria, and pertussis booster shot.  Influenza every year before the flu season begins.  Pneumonia vaccine.  Shingles vaccine.  Others, as indicated based on your specific needs. Talk to your caregiver about these. Document Released: 12/30/2005 Document Revised: 03/24/2014 Document Reviewed: 08/25/2008 ExitCare Patient Information 2015 [employer]. This information is not intended to replace advice given to you by your health care provider. Make sure you discuss any questions you have with your health care provider.  

## 2014-09-30 NOTE — Progress Notes (Signed)
Tara [name] 12/24/1972 [id number]    History:    Presents for Breast and pelvic exam. Postmenopausal/no HRT/no bleeding Normal Pap and mammogram history. 09/2012 DEXA T score -1.4 left femoral neck FRAX 9.2%/1.5%, traumatic fall this past year with no fractures. 2010 negative colonoscopy. Has had  Pneumovax not Zostavax. Hypertension, hypothyroidism primary care manages.  Past medical history, past surgical history, family history and social history were all reviewed and documented in the EPIC chart. Retired from [employer] 2013. Husband having health problems. Recently moved to a retirement community.  ROS:  A  12 point ROS was performed and pertinent positives and negatives are included.  Exam:  Filed Vitals:   09/30/14 1518  BP: 140/80    General appearance:  Normal Thyroid:  Symmetrical, normal in size, without palpable masses or nodularity. Respiratory  Auscultation:  Clear without wheezing or rhonchi Cardiovascular  Auscultation:  Regular rate, without rubs, murmurs or gallops  Edema/varicosities:  Not grossly evident Abdominal  Soft,nontender, without masses, guarding or rebound.  Liver/spleen:  No organomegaly noted  Hernia:  None appreciated  Skin  Inspection:  Grossly normal   Breasts: Examined lying and sitting.     Right: Without masses, retractions, discharge or axillary adenopathy.     Left: Without masses, retractions, discharge or axillary adenopathy. Gentitourinary   Inguinal/mons:  Normal without inguinal adenopathy  External genitalia:  Normal  BUS/Urethra/Skene's glands:  Normal  Vagina:  Asymptomatic +1 cystocele  Cervix:  Normal  Uterus:   normal in size, shape and contour.  Midline and mobile  Adnexa/parametria:     Rt: Without masses or tenderness.   Lt: Without masses or tenderness.  Anus and perineum: Normal  Digital rectal exam: Normal sphincter tone without palpated masses or tenderness  Assessment/Plan:  78 y.o. MWF G2P2 for Breast and  pelvic exam with no complaints.  Postmenopausal/no bleeding/no HRT Hypertension/hypothyroidism-primary care manages labs and meds Osteopenia without elevated FRAX  Plan: SBE's, continue annual mammogram, calcium rich diet, vitamin D 2000 daily encouraged. Home safety, fall prevention and importance of regular weightbearing exercise reviewed. Repeat DEXA this year. Encourage Zostavax, will review with primary care questions if ever had chickenpox.    [place], 4:55 PM 09/30/2014

## 2014-10-06 DIAGNOSIS — L820 Inflamed seborrheic keratosis: Secondary | ICD-10-CM | POA: Diagnosis not present

## 2014-10-06 DIAGNOSIS — L821 Other seborrheic keratosis: Secondary | ICD-10-CM | POA: Diagnosis not present

## 2014-10-06 DIAGNOSIS — D225 Melanocytic nevi of trunk: Secondary | ICD-10-CM | POA: Diagnosis not present

## 2014-10-08 DIAGNOSIS — D123 Benign neoplasm of transverse colon: Secondary | ICD-10-CM | POA: Diagnosis not present

## 2014-10-08 DIAGNOSIS — K5730 Diverticulosis of large intestine without perforation or abscess without bleeding: Secondary | ICD-10-CM | POA: Diagnosis not present

## 2014-10-08 DIAGNOSIS — D125 Benign neoplasm of sigmoid colon: Secondary | ICD-10-CM | POA: Diagnosis not present

## 2014-10-08 DIAGNOSIS — K635 Polyp of colon: Secondary | ICD-10-CM | POA: Diagnosis not present

## 2014-10-08 DIAGNOSIS — Z86010 Personal history of colonic polyps: Secondary | ICD-10-CM | POA: Diagnosis not present

## 2014-10-08 DIAGNOSIS — Z09 Encounter for follow-up examination after completed treatment for conditions other than malignant neoplasm: Secondary | ICD-10-CM | POA: Diagnosis not present

## 2014-10-21 ENCOUNTER — Encounter: Payer: Medicare Other | Admitting: Women's Health

## 2014-11-05 ENCOUNTER — Encounter: Payer: Self-pay | Admitting: Internal Medicine

## 2015-01-23 ENCOUNTER — Telehealth: Payer: Self-pay | Admitting: Internal Medicine

## 2015-01-23 DIAGNOSIS — N390 Urinary tract infection, site not specified: Secondary | ICD-10-CM

## 2015-01-23 MED ORDER — CIPROFLOXACIN HCL 500 MG PO TABS: 500.0000 mg | ORAL_TABLET | Freq: Two times a day (BID) | ORAL | Status: DC

## 2015-01-23 NOTE — Telephone Encounter (Signed)
Sent script for Cipro to patient pharmacy

## 2015-01-23 NOTE — Telephone Encounter (Signed)
Call in Cipro 500 mg bid x 7 days

## 2015-01-23 NOTE — Telephone Encounter (Signed)
Patient calls stating she's sure she has a UTI.  Her sister is in Hospice and they've pretty much said today is the day that she'll pass.  She spent yesterday there in [place] and she needs to be there today as well.  We don't really have anywhere on the books to put her.  Under the circumstances, patient wants to know if you would call her something in for the UTI.  States you have treated her for infections for years.  States she'll come in next week if she has to, she just needs to get relief and needs to be with her sister today.  Is there a way we can help her today so that she can be with her dying sister?    Please advise.

## 2015-02-04 DIAGNOSIS — H25811 Combined forms of age-related cataract, right eye: Secondary | ICD-10-CM | POA: Diagnosis not present

## 2015-02-12 DIAGNOSIS — H25811 Combined forms of age-related cataract, right eye: Secondary | ICD-10-CM | POA: Diagnosis not present

## 2015-02-12 DIAGNOSIS — H2511 Age-related nuclear cataract, right eye: Secondary | ICD-10-CM | POA: Diagnosis not present

## 2015-02-25 DIAGNOSIS — L821 Other seborrheic keratosis: Secondary | ICD-10-CM | POA: Diagnosis not present

## 2015-03-09 DIAGNOSIS — H25812 Combined forms of age-related cataract, left eye: Secondary | ICD-10-CM | POA: Diagnosis not present

## 2015-03-09 DIAGNOSIS — H2512 Age-related nuclear cataract, left eye: Secondary | ICD-10-CM | POA: Diagnosis not present

## 2015-03-31 ENCOUNTER — Other Ambulatory Visit: Payer: Self-pay | Admitting: Internal Medicine

## 2015-04-06 DIAGNOSIS — N3000 Acute cystitis without hematuria: Secondary | ICD-10-CM | POA: Diagnosis not present

## 2015-04-06 DIAGNOSIS — N390 Urinary tract infection, site not specified: Secondary | ICD-10-CM | POA: Diagnosis not present

## 2015-04-18 DIAGNOSIS — B373 Candidiasis of vulva and vagina: Secondary | ICD-10-CM | POA: Diagnosis not present

## 2015-04-18 DIAGNOSIS — N3001 Acute cystitis with hematuria: Secondary | ICD-10-CM | POA: Diagnosis not present

## 2015-04-19 DIAGNOSIS — N3001 Acute cystitis with hematuria: Secondary | ICD-10-CM | POA: Diagnosis not present

## 2015-05-06 DIAGNOSIS — B373 Candidiasis of vulva and vagina: Secondary | ICD-10-CM | POA: Diagnosis not present

## 2015-05-06 DIAGNOSIS — B354 Tinea corporis: Secondary | ICD-10-CM | POA: Diagnosis not present

## 2015-05-06 DIAGNOSIS — N390 Urinary tract infection, site not specified: Secondary | ICD-10-CM | POA: Diagnosis not present

## 2015-05-06 DIAGNOSIS — N3020 Other chronic cystitis without hematuria: Secondary | ICD-10-CM | POA: Diagnosis not present

## 2015-05-18 ENCOUNTER — Other Ambulatory Visit: Payer: Self-pay

## 2015-05-28 ENCOUNTER — Other Ambulatory Visit: Payer: Self-pay | Admitting: Internal Medicine

## 2015-06-12 DIAGNOSIS — N3020 Other chronic cystitis without hematuria: Secondary | ICD-10-CM | POA: Diagnosis not present

## 2015-06-12 DIAGNOSIS — B354 Tinea corporis: Secondary | ICD-10-CM | POA: Diagnosis not present

## 2015-06-12 DIAGNOSIS — B373 Candidiasis of vulva and vagina: Secondary | ICD-10-CM | POA: Diagnosis not present

## 2015-06-17 ENCOUNTER — Other Ambulatory Visit: Payer: Self-pay | Admitting: Internal Medicine

## 2015-06-17 NOTE — Telephone Encounter (Signed)
No PE since 7/15. Cannot refill until books CPE

## 2015-06-23 DIAGNOSIS — N3001 Acute cystitis with hematuria: Secondary | ICD-10-CM | POA: Diagnosis not present

## 2015-06-23 DIAGNOSIS — N3090 Cystitis, unspecified without hematuria: Secondary | ICD-10-CM | POA: Diagnosis not present

## 2015-06-26 DIAGNOSIS — N3000 Acute cystitis without hematuria: Secondary | ICD-10-CM | POA: Diagnosis not present

## 2015-06-26 DIAGNOSIS — N952 Postmenopausal atrophic vaginitis: Secondary | ICD-10-CM | POA: Diagnosis not present

## 2015-07-08 DIAGNOSIS — Z1231 Encounter for screening mammogram for malignant neoplasm of breast: Secondary | ICD-10-CM | POA: Diagnosis not present

## 2015-07-08 DIAGNOSIS — M8580 Other specified disorders of bone density and structure, unspecified site: Secondary | ICD-10-CM | POA: Diagnosis not present

## 2015-07-08 DIAGNOSIS — Z803 Family history of malignant neoplasm of breast: Secondary | ICD-10-CM | POA: Diagnosis not present

## 2015-07-09 ENCOUNTER — Encounter: Payer: Self-pay | Admitting: Women's Health

## 2015-07-14 ENCOUNTER — Encounter: Payer: Self-pay | Admitting: Women's Health

## 2015-07-15 DIAGNOSIS — R300 Dysuria: Secondary | ICD-10-CM | POA: Diagnosis not present

## 2015-07-20 ENCOUNTER — Encounter: Payer: Self-pay | Admitting: Women's Health

## 2015-07-20 DIAGNOSIS — R921 Mammographic calcification found on diagnostic imaging of breast: Secondary | ICD-10-CM | POA: Diagnosis not present

## 2015-07-21 ENCOUNTER — Encounter: Payer: Self-pay | Admitting: Women's Health

## 2015-08-11 ENCOUNTER — Other Ambulatory Visit: Payer: Self-pay | Admitting: Radiology

## 2015-08-11 DIAGNOSIS — Z0000 Encounter for general adult medical examination without abnormal findings: Secondary | ICD-10-CM | POA: Diagnosis not present

## 2015-08-11 DIAGNOSIS — R920 Mammographic microcalcification found on diagnostic imaging of breast: Secondary | ICD-10-CM | POA: Diagnosis not present

## 2015-08-11 DIAGNOSIS — D0511 Intraductal carcinoma in situ of right breast: Secondary | ICD-10-CM | POA: Diagnosis not present

## 2015-08-14 ENCOUNTER — Encounter: Payer: Self-pay | Admitting: *Deleted

## 2015-08-14 ENCOUNTER — Encounter: Payer: Self-pay | Admitting: Women's Health

## 2015-08-14 ENCOUNTER — Telehealth: Payer: Self-pay | Admitting: *Deleted

## 2015-08-14 DIAGNOSIS — C50411 Malignant neoplasm of upper-outer quadrant of right female breast: Secondary | ICD-10-CM | POA: Insufficient documentation

## 2015-08-14 NOTE — Telephone Encounter (Signed)
Confirmed [facility] for 08/19/15 at 1230pm .  Instructions and contact information given.  Patient is having lab work on Monday and does not want to repeat on Wednesday. She will bring a copy with her on 9/28.

## 2015-08-17 ENCOUNTER — Encounter: Payer: Self-pay | Admitting: Internal Medicine

## 2015-08-17 ENCOUNTER — Ambulatory Visit (INDEPENDENT_AMBULATORY_CARE_PROVIDER_SITE_OTHER): Payer: Medicare Other | Admitting: Internal Medicine

## 2015-08-17 VITALS — BP 126/82 | HR 78 | Temp 97.9°F | Ht 63.0 in | Wt 228.0 lb

## 2015-08-17 DIAGNOSIS — F439 Reaction to severe stress, unspecified: Secondary | ICD-10-CM

## 2015-08-17 DIAGNOSIS — Z860100 Personal history of colon polyps, unspecified: Secondary | ICD-10-CM

## 2015-08-17 DIAGNOSIS — Z79899 Other long term (current) drug therapy: Secondary | ICD-10-CM

## 2015-08-17 DIAGNOSIS — M25552 Pain in left hip: Secondary | ICD-10-CM

## 2015-08-17 DIAGNOSIS — M545 Low back pain, unspecified: Secondary | ICD-10-CM

## 2015-08-17 DIAGNOSIS — R7302 Impaired glucose tolerance (oral): Secondary | ICD-10-CM | POA: Diagnosis not present

## 2015-08-17 DIAGNOSIS — Z0000 Encounter for general adult medical examination without abnormal findings: Secondary | ICD-10-CM

## 2015-08-17 DIAGNOSIS — R5383 Other fatigue: Secondary | ICD-10-CM | POA: Diagnosis not present

## 2015-08-17 DIAGNOSIS — M25551 Pain in right hip: Secondary | ICD-10-CM | POA: Diagnosis not present

## 2015-08-17 DIAGNOSIS — M170 Bilateral primary osteoarthritis of knee: Secondary | ICD-10-CM | POA: Diagnosis not present

## 2015-08-17 DIAGNOSIS — Z658 Other specified problems related to psychosocial circumstances: Secondary | ICD-10-CM

## 2015-08-17 DIAGNOSIS — D0511 Intraductal carcinoma in situ of right breast: Secondary | ICD-10-CM

## 2015-08-17 DIAGNOSIS — I10 Essential (primary) hypertension: Secondary | ICD-10-CM | POA: Diagnosis not present

## 2015-08-17 DIAGNOSIS — R7309 Other abnormal glucose: Secondary | ICD-10-CM

## 2015-08-17 DIAGNOSIS — F411 Generalized anxiety disorder: Secondary | ICD-10-CM | POA: Diagnosis not present

## 2015-08-17 DIAGNOSIS — E559 Vitamin D deficiency, unspecified: Secondary | ICD-10-CM

## 2015-08-17 DIAGNOSIS — N390 Urinary tract infection, site not specified: Secondary | ICD-10-CM

## 2015-08-17 DIAGNOSIS — E039 Hypothyroidism, unspecified: Secondary | ICD-10-CM | POA: Diagnosis not present

## 2015-08-17 DIAGNOSIS — Z13220 Encounter for screening for lipoid disorders: Secondary | ICD-10-CM

## 2015-08-17 DIAGNOSIS — Z136 Encounter for screening for cardiovascular disorders: Secondary | ICD-10-CM

## 2015-08-17 DIAGNOSIS — E669 Obesity, unspecified: Secondary | ICD-10-CM | POA: Diagnosis not present

## 2015-08-17 DIAGNOSIS — Z23 Encounter for immunization: Secondary | ICD-10-CM | POA: Diagnosis not present

## 2015-08-17 DIAGNOSIS — R7303 Prediabetes: Secondary | ICD-10-CM

## 2015-08-17 LAB — COMPLETE METABOLIC PANEL WITH GFR
ALT: 19 U/L (ref 6–29)
AST: 21 U/L (ref 10–35)
Albumin: 4 g/dL (ref 3.6–5.1)
Alkaline Phosphatase: 76 U/L (ref 33–130)
BUN: 13 mg/dL (ref 7–25)
CO2: 26 mmol/L (ref 20–31)
Calcium: 9.3 mg/dL (ref 8.6–10.4)
Chloride: 105 mmol/L (ref 98–110)
Creat: 0.8 mg/dL (ref 0.60–0.93)
GFR, Est African American: 82 mL/min (ref >=60)
GFR, Est Non African American: 71 mL/min (ref >=60)
Glucose, Bld: 96 mg/dL (ref 65–99)
Potassium: 4.4 mmol/L (ref 3.5–5.3)
Sodium: 139 mmol/L (ref 135–146)
Total Bilirubin: 0.9 mg/dL (ref 0.2–1.2)
Total Protein: 6.8 g/dL (ref 6.1–8.1)

## 2015-08-17 LAB — POCT URINALYSIS DIPSTICK
Bilirubin, UA: NEGATIVE
Blood, UA: NEGATIVE
Glucose, UA: NEGATIVE
Ketones, UA: NEGATIVE
Leukocytes, UA: NEGATIVE
Nitrite, UA: NEGATIVE
Protein, UA: NEGATIVE
Spec Grav, UA: 1.01
Urobilinogen, UA: NEGATIVE
pH, UA: 5

## 2015-08-17 LAB — LIPID PANEL
Cholesterol: 188 mg/dL (ref 125–200)
HDL: 51 mg/dL (ref >=46)
LDL Cholesterol: 113 mg/dL (ref <130)
Total CHOL/HDL Ratio: 3.7 Ratio (ref <=5.0)
Triglycerides: 119 mg/dL (ref <150)
VLDL: 24 mg/dL (ref <30)

## 2015-08-17 LAB — CBC WITH DIFFERENTIAL/PLATELET
Basophils Absolute: 0.1 10*3/uL (ref 0.0–0.1)
Basophils Relative: 1 % (ref 0–1)
Eosinophils Absolute: 0.3 10*3/uL (ref 0.0–0.7)
Eosinophils Relative: 4 % (ref 0–5)
HCT: 38 % (ref 36.0–46.0)
Hemoglobin: 12.7 g/dL (ref 12.0–15.0)
Lymphocytes Relative: 43 % (ref 12–46)
Lymphs Abs: 3 10*3/uL (ref 0.7–4.0)
MCH: 31.9 pg (ref 26.0–34.0)
MCHC: 33.4 g/dL (ref 30.0–36.0)
MCV: 95.5 fL (ref 78.0–100.0)
MPV: 10.3 fL (ref 8.6–12.4)
Monocytes Absolute: 0.5 10*3/uL (ref 0.1–1.0)
Monocytes Relative: 7 % (ref 3–12)
Neutro Abs: 3.1 10*3/uL (ref 1.7–7.7)
Neutrophils Relative %: 45 % (ref 43–77)
Platelets: 311 10*3/uL (ref 150–400)
RBC: 3.98 MIL/uL (ref 3.87–5.11)
RDW: 13.7 % (ref 11.5–15.5)
WBC: 6.9 10*3/uL (ref 4.0–10.5)

## 2015-08-17 LAB — TSH: TSH: 3.353 u[IU]/mL (ref 0.350–4.500)

## 2015-08-17 MED ORDER — DIAZEPAM 5 MG PO TABS: 5.0000 mg | ORAL_TABLET | Freq: Two times a day (BID) | ORAL | Status: DC | PRN

## 2015-08-17 NOTE — Patient Instructions (Addendum)
To have Prevnar. Flu vaccine given. RTC 6 months. See Oncology team and Urologist. Valium refilled for anxiety. Encouraged diet and weight loss efforts.
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
## 2015-08-18 LAB — HEMOGLOBIN A1C
Hgb A1c MFr Bld: 5.8 % — ABNORMAL HIGH (ref <5.7)
Mean Plasma Glucose: 120 mg/dL — ABNORMAL HIGH (ref <117)

## 2015-08-18 LAB — VITAMIN D 25 HYDROXY (VIT D DEFICIENCY, FRACTURES): Vit D, 25-Hydroxy: 31 ng/mL (ref 30–100)

## 2015-08-19 ENCOUNTER — Ambulatory Visit (HOSPITAL_BASED_OUTPATIENT_CLINIC_OR_DEPARTMENT_OTHER): Payer: Medicare Other | Admitting: Hematology and Oncology

## 2015-08-19 ENCOUNTER — Ambulatory Visit
Admission: RE | Admit: 2015-08-19 | Discharge: 2015-08-19 | Disposition: A | Discharge status: Home / Self Care | Payer: Medicare Other | Source: Ambulatory Visit | Attending: Radiation Oncology | Admitting: Radiation Oncology

## 2015-08-19 ENCOUNTER — Other Ambulatory Visit: Payer: Self-pay | Admitting: General Surgery

## 2015-08-19 ENCOUNTER — Encounter: Payer: Self-pay | Admitting: Hematology and Oncology

## 2015-08-19 ENCOUNTER — Ambulatory Visit: Payer: Medicare Other | Admitting: Physical Therapy

## 2015-08-19 ENCOUNTER — Encounter: Payer: Self-pay | Admitting: Radiation Oncology

## 2015-08-19 ENCOUNTER — Encounter: Payer: Self-pay | Admitting: General Practice

## 2015-08-19 VITALS — BP 162/69 | HR 81 | Temp 98.0°F | Resp 20 | Ht 63.0 in | Wt 226.8 lb

## 2015-08-19 DIAGNOSIS — D0511 Intraductal carcinoma in situ of right breast: Secondary | ICD-10-CM | POA: Diagnosis not present

## 2015-08-19 DIAGNOSIS — C50411 Malignant neoplasm of upper-outer quadrant of right female breast: Secondary | ICD-10-CM | POA: Diagnosis not present

## 2015-08-19 DIAGNOSIS — Z803 Family history of malignant neoplasm of breast: Secondary | ICD-10-CM | POA: Diagnosis not present

## 2015-08-19 DIAGNOSIS — I10 Essential (primary) hypertension: Secondary | ICD-10-CM | POA: Diagnosis not present

## 2015-08-19 DIAGNOSIS — E669 Obesity, unspecified: Secondary | ICD-10-CM | POA: Diagnosis not present

## 2015-08-19 DIAGNOSIS — Z170 Estrogen receptor positive status [ER+]: Secondary | ICD-10-CM

## 2015-08-19 DIAGNOSIS — Z86010 Personal history of colonic polyps: Secondary | ICD-10-CM | POA: Diagnosis not present

## 2015-08-19 DIAGNOSIS — Z96652 Presence of left artificial knee joint: Secondary | ICD-10-CM | POA: Diagnosis not present

## 2015-08-19 NOTE — Progress Notes (Signed)
[facility] NOTE  Patient Care Team: Elby Showers, MD as PCP - General (Internal Medicine) Fanny Skates, MD as Consulting Physician (General Surgery) Nicholas Lose, MD as Consulting Physician (Hematology and Oncology) Kyung Rudd, MD as Consulting Physician (Radiation Oncology) Rockwell Germany, RN as Registered Nurse Mauro Kaufmann, RN as Registered Nurse  CHIEF COMPLAINTS/PURPOSE OF CONSULTATION:  Newly diagnosed breast cancer  HISTORY OF PRESENTING ILLNESS:  Tara [name] 79 y.o. female is here because of recent diagnosis of the right breast DCIS. She repeat screening mammogram that revealed 5 mm area of calcifications. This led to an ultrasound and biopsy. Final pathology came back as DCIS. ER 100% PR 60% intermediate grade. During this whole process her husband was diagnosed with stroke and had to undergo carotid endarterectomy. She improved significantly from this biopsy and bled for a few days. She was on aspirin when she underwent the biopsy.  I reviewed her records extensively and collaborated the history with the patient.  SUMMARY OF ONCOLOGIC HISTORY:   Breast cancer of upper-outer quadrant of right female breast   08/11/2015 Mammogram 5 mm right breast calcifications   08/11/2015 Initial Diagnosis Right breast biopsy: DCIS with calcifications ER 100%, PR 60%, intermediate grade, Tis N0 stage 0    In terms of breast cancer risk profile:  She menarched at early age of 24 and went to menopause at age 63  She had 2 pregnancy, her first child was born at age 38  She has not received birth control pills.  She was never exposed to fertility medications or hormone replacement therapy.  She has  family history of Breast/GYN/GI cancer Sister. breast cancer at age 37  MEDICAL HISTORY:  Past Medical History  Diagnosis Date  . Thyroid disease     hypothyroidism  . Arthritis     knees  . Obesity   . Pre-diabetes   . Dependent edema   . Hypertension   .  Essential hypertension, benign 04/15/2011  . Radicular pain   . Breast cancer     SURGICAL HISTORY: Past Surgical History  Procedure Laterality Date  . Knee arthroscopy      left knee  . Joint replacement      rt shoulder rotator cuff  . Hand surgery  1951    RIGHT HAND  . Ankle surgery  1994    RIGHT  . Rotator cuff repair  2011    RIGHT  . Total knee arthroplasty      Left  . Tonsillectomy    . Lumbar laminectomy/decompression microdiscectomy Right 01/11/2013    Procedure: LUMBAR LAMINECTOMY/DECOMPRESSION MICRODISCECTOMY 1 LEVEL,Right Lumbar Three-Four;  Surgeon: Elaina Hoops, MD;  Location: [facility];  Service: Neurosurgery;  Laterality: Right;  . Total hip arthroplasty      SOCIAL HISTORY: Social History   Social History  . Marital Status: Married    Spouse Name: N/A  . Number of Children: N/A  . Years of Education: N/A   Occupational History  . Not on file.   Social History Main Topics  . Smoking status: Never Smoker   . Smokeless tobacco: Never Used  . Alcohol Use: No  . Drug Use: No  . Sexual Activity: No   Other Topics Concern  . Not on file   Social History Narrative    FAMILY HISTORY: Family History  Problem Relation Age of Onset  . Breast cancer Sister   . Cancer Brother     Kidney  . Heart  failure Father   . Uterine cancer Sister   . Breast cancer Sister     Uterine  . Cancer Brother     Lung    ALLERGIES:  is allergic to ceclor.  MEDICATIONS:  Current Outpatient Prescriptions  Medication Sig Dispense Refill  . amLODipine (NORVASC) 5 MG tablet TAKE 1 TABLET EVERY DAY 90 tablet 3  . aspirin 81 MG EC tablet Take 81 mg by mouth daily.      . Cholecalciferol (VITAMIN D3) 1000 UNITS CAPS Take 1,000 Units by mouth 2 (two) times daily.      . Cranberry 200 MG CAPS Take by mouth.    . estradiol (ESTRACE) 0.1 MG/GM vaginal cream Place 1 Applicatorful vaginally at bedtime.    Marland Kitchen levothyroxine (SYNTHROID, LEVOTHROID) 75 MCG tablet TAKE 1  TABLET EVERY DAY 90 tablet 0  . losartan (COZAAR) 100 MG tablet TAKE 1 TABLET EVERY DAY 90 tablet 3  . potassium chloride SA (K-DUR,KLOR-CON) 20 MEQ tablet Take 1 tablet (20 mEq total) by mouth 2 (two) times daily. 180 tablet 3  . Probiotic Product (PROBIOTIC DAILY PO) Take by mouth daily.    Marland Kitchen torsemide (DEMADEX) 20 MG tablet Take 2 tablets (40 mg total) by mouth every morning. 180 tablet 3  . trimethoprim (TRIMPEX) 100 MG tablet Take 100 mg by mouth daily.     No current facility-administered medications for this visit.    REVIEW OF SYSTEMS:   Constitutional: Denies fevers, chills or abnormal night sweats Eyes: Denies blurriness of vision, double vision or watery eyes Ears, nose, mouth, throat, and face: Denies mucositis or sore throat Respiratory: Denies cough, dyspnea or wheezes Cardiovascular: Denies palpitation, chest discomfort or lower extremity swelling Gastrointestinal:  Denies nausea, heartburn or change in bowel habits Skin: Denies abnormal skin rashes Lymphatics: Denies new lymphadenopathy or easy bruising Neurological:Denies numbness, tingling or new weaknesses Behavioral/Psych: Mood is stable, no new changes  Breast: Extensive bruising from biopsy All other systems were reviewed with the patient and are negative.  PHYSICAL EXAMINATION: ECOG PERFORMANCE STATUS: 1 - Symptomatic but completely ambulatory  Filed Vitals:   08/19/15 1306  BP: 162/69  Pulse: 81  Temp: 98 F (36.7 C)  Resp: 20   Filed Weights   08/19/15 1306  Weight: 226 lb 12.8 oz (102.876 kg)    GENERAL:alert, no distress and comfortable SKIN: skin color, texture, turgor are normal, no rashes or significant lesions EYES: normal, conjunctiva are pink and non-injected, sclera clear OROPHARYNX:no exudate, no erythema and lips, buccal mucosa, and tongue normal  NECK: supple, thyroid normal size, non-tender, without nodularity LYMPH:  no palpable lymphadenopathy in the cervical, axillary or  inguinal LUNGS: clear to auscultation and percussion with normal breathing effort HEART: regular rate & rhythm and no murmurs and no lower extremity edema ABDOMEN:abdomen soft, non-tender and normal bowel sounds Musculoskeletal:no cyanosis of digits and no clubbing  PSYCH: alert & oriented x 3 with fluent speech NEURO: no focal motor/sensory deficits BREAST: Extensive bruising of the right breast from the recent biopsy with the firm palpable thickening of the breast. No palpable axillary or supraclavicular lymphadenopathy (exam performed in the presence of a chaperone)   LABORATORY DATA:  I have reviewed the data as listed Lab Results  Component Value Date   WBC 6.9 08/17/2015   HGB 12.7 08/17/2015   HCT 38.0 08/17/2015   MCV 95.5 08/17/2015   PLT 311 08/17/2015   Lab Results  Component Value Date   NA 139 08/17/2015   K  4.4 08/17/2015   CL 105 08/17/2015   CO2 26 08/17/2015   ASSESSMENT AND PLAN:  Breast cancer of upper-outer quadrant of right female breast Right breast DCIS intermediate grade, 5 mm screening detected right breast calcifications, Tis N0 stage 0 ER 100%, PR 60%  Pathology review: I discussed with the patient the difference between DCIS and invasive breast cancer. It is considered a precancerous lesion. DCIS is classified as a 0. It is generally detected through mammograms as calcifications. We discussed the significance of grades and its impact on prognosis. We also discussed the importance of ER and PR receptors and their implications to adjuvant treatment options. Prognosis of DCIS dependence on grade, comedo necrosis. It is anticipated that if not treated, 20-30% of DCIS can develop into invasive breast cancer.  Recommendation: 1. Breast conserving surgery 2. Followed by adjuvant radiation therapy 3. Followed by antiestrogen therapy with tamoxifen vs anastrozole 5 years  Tamoxifen counseling: We discussed the risks and benefits of tamoxifen. These include but  not limited to insomnia, hot flashes, mood changes, vaginal dryness, and weight gain. Although rare, serious side effects including endometrial cancer, risk of blood clots were also discussed. We strongly believe that the benefits far outweigh the risks. Patient understands these risks and consented to starting treatment. Planned treatment duration is 5 years.  Return to clinic after surgery to discuss the final pathology report and come up with an adjuvant treatment plan.   All questions were answered. The patient knows to call the clinic with any problems, questions or concerns.    Rulon Eisenmenger, MD 2:49 PM

## 2015-08-19 NOTE — Progress Notes (Signed)
Note created by Dr. Gudena during office visit, copy to patient,original to scan. 

## 2015-08-19 NOTE — Assessment & Plan Note (Signed)
Right breast DCIS intermediate grade, 5 mm screening detected right breast calcifications, Tis N0 stage 0 ER 100%, PR 60%  Pathology review: I discussed with the patient the difference between DCIS and invasive breast cancer. It is considered a precancerous lesion. DCIS is classified as a 0. It is generally detected through mammograms as calcifications. We discussed the significance of grades and its impact on prognosis. We also discussed the importance of ER and PR receptors and their implications to adjuvant treatment options. Prognosis of DCIS dependence on grade, comedo necrosis. It is anticipated that if not treated, 20-30% of DCIS can develop into invasive breast cancer.  Recommendation: 1. Breast conserving surgery 2. Followed by adjuvant radiation therapy 3. Followed by antiestrogen therapy with tamoxifen vs anastrozole 5 years  Tamoxifen counseling: We discussed the risks and benefits of tamoxifen. These include but not limited to insomnia, hot flashes, mood changes, vaginal dryness, and weight gain. Although rare, serious side effects including endometrial cancer, risk of blood clots were also discussed. We strongly believe that the benefits far outweigh the risks. Patient understands these risks and consented to starting treatment. Planned treatment duration is 5 years.  Return to clinic after surgery to discuss the final pathology report and come up with an adjuvant treatment plan.

## 2015-08-19 NOTE — Progress Notes (Signed)
Radiation Oncology         [phone] ________________________________  Name: Tara [name] MRN: [id number]  Date: 08/19/2015  DOB: 01/12/1936  AX:[id number],MOLM J, MD  Fanny Skates, MD     REFERRING PHYSICIAN: Fanny Skates, MD   DIAGNOSIS: The encounter diagnosis was Breast cancer of upper-outer quadrant of right female breast. Right breast ductal carcinoma in situ   HISTORY OF PRESENT ILLNESS::Tara [name] is a 79 y.o. female who is seen for an initial consultation visit regarding the patient's diagnosis of right breast ductal carcinoma in situ.  The patient presented with focal area of calcifications in the upper outer quadrant of the right breast found during annual mammography. Biopsy performed shows 5 mm intermediate Grade 2 DCIS, ER+ PR+ HER-2(-). Family history of breast cancer, her sister was diagnosed at 55 yo.   PREVIOUS RADIATION THERAPY: No   PAST MEDICAL HISTORY:  has a past medical history of Thyroid disease; Arthritis; Obesity; Pre-diabetes; Dependent edema; Hypertension; Essential hypertension, benign (04/15/2011); Radicular pain; and Breast cancer.     PAST SURGICAL HISTORY: Past Surgical History  Procedure Laterality Date  . Knee arthroscopy      left knee  . Joint replacement      rt shoulder rotator cuff  . Hand surgery  1951    RIGHT HAND  . Ankle surgery  1994    RIGHT  . Rotator cuff repair  2011    RIGHT  . Total knee arthroplasty      Left  . Tonsillectomy    . Lumbar laminectomy/decompression microdiscectomy Right 01/11/2013    Procedure: LUMBAR LAMINECTOMY/DECOMPRESSION MICRODISCECTOMY 1 LEVEL,Right Lumbar Three-Four;  Surgeon: Elaina Hoops, MD;  Location: [facility];  Service: Neurosurgery;  Laterality: Right;  . Total hip arthroplasty       FAMILY HISTORY: family history includes Breast cancer in her sister and sister; Cancer in her brother and brother; Heart failure in her father; Uterine cancer in her sister.   SOCIAL HISTORY:   reports that she has never smoked. She has never used smokeless tobacco. She reports that she does not drink alcohol or use illicit drugs.   ALLERGIES: Ceclor   MEDICATIONS:  Current Outpatient Prescriptions  Medication Sig Dispense Refill  . amLODipine (NORVASC) 5 MG tablet TAKE 1 TABLET EVERY DAY 90 tablet 3  . aspirin 81 MG EC tablet Take 81 mg by mouth daily.      . Cholecalciferol (VITAMIN D3) 1000 UNITS CAPS Take 1,000 Units by mouth 2 (two) times daily.      . Cranberry 200 MG CAPS Take by mouth.    . estradiol (ESTRACE) 0.1 MG/GM vaginal cream Place 1 Applicatorful vaginally at bedtime.    Marland Kitchen levothyroxine (SYNTHROID, LEVOTHROID) 75 MCG tablet TAKE 1 TABLET EVERY DAY 90 tablet 0  . losartan (COZAAR) 100 MG tablet TAKE 1 TABLET EVERY DAY 90 tablet 3  . potassium chloride SA (K-DUR,KLOR-CON) 20 MEQ tablet Take 1 tablet (20 mEq total) by mouth 2 (two) times daily. 180 tablet 3  . Probiotic Product (PROBIOTIC DAILY PO) Take by mouth daily.    Marland Kitchen torsemide (DEMADEX) 20 MG tablet Take 2 tablets (40 mg total) by mouth every morning. 180 tablet 3  . trimethoprim (TRIMPEX) 100 MG tablet Take 100 mg by mouth daily.     No current facility-administered medications for this encounter.     REVIEW OF SYSTEMS:  A 15 point review of systems is documented in the electronic medical record. This was obtained by the  nursing staff. However, I reviewed this with the patient to discuss relevant findings and make appropriate changes.  Pertinent items are noted in HPI.   The patient is accompanied by her husband. The patient has done extensive research to learn about her diagnosis and her physician team. She experienced extensive bruising after her biopsy, stating that it stopped bleeding early this week. She reports the biopsy is the most pain she has ever had. She has a history of urinary tract infections and yeast infections, she is currently on an antibiotic. She has had 6 urinary tract infections  infections since March of this year. Her husband sees Dr. Clydene Laming for prostate cancer. Her primary concern is being able to care for her husband while receiving treatment. The patient questioned if observation was an option at this time. She last had a routine physical exam with her PCP, Dr. Renold Genta, this past Monday, 9/26.    PHYSICAL EXAM:  vitals were not taken for this visit.  Vitals with BMI 08/19/2015  Height 5' 3"   Weight 226 lbs 13 oz  BMI 01.7  Systolic 793  Diastolic 69  Pulse 81  Respirations 20   General: Well-developed, in no acute distress HEENT: Normocephalic, atraumatic Cardiovascular: Regular rate and rhythm Respiratory: Clear to auscultation bilaterally GI: Soft, nontender, normal bowel sounds Extremities: No edema present Breast: Biopsy site in the lateral right breast with a large area of ecchymosis. Palpable hematoma underneath the biopsy site. No axillary lymph nodes. Normal breast exam of the left breast.  ECOG = 0  0 - Asymptomatic (Fully active, able to carry on all predisease activities without restriction)  1 - Symptomatic but completely ambulatory (Restricted in physically strenuous activity but ambulatory and able to carry out work of a light or sedentary nature. For example, light housework, office work)  2 - Symptomatic, <50% in bed during the day (Ambulatory and capable of all self care but unable to carry out any work activities. Up and about more than 50% of waking hours)  3 - Symptomatic, >50% in bed, but not bedbound (Capable of only limited self-care, confined to bed or chair 50% or more of waking hours)  4 - Bedbound (Completely disabled. Cannot carry on any self-care. Totally confined to bed or chair)  5 - Death   Eustace Pen MM, Creech RH, Tormey DC, et al. [phone]). "Toxicity and response criteria of the [facility]". [place] Oncol. 5 (6): 649-55    LABORATORY DATA:  Lab Results  Component Value Date   WBC 6.9  08/17/2015   HGB 12.7 08/17/2015   HCT 38.0 08/17/2015   MCV 95.5 08/17/2015   PLT 311 08/17/2015   Lab Results  Component Value Date   NA 139 08/17/2015   K 4.4 08/17/2015   CL 105 08/17/2015   CO2 26 08/17/2015   Lab Results  Component Value Date   ALT 19 08/17/2015   AST 21 08/17/2015   ALKPHOS 76 08/17/2015   BILITOT 0.9 08/17/2015      RADIOGRAPHY: No results found.     IMPRESSION:    Breast cancer of upper-outer quadrant of right female breast   08/11/2015 Mammogram 5 mm right breast calcifications   08/11/2015 Initial Diagnosis Right breast biopsy: DCIS with calcifications ER 100%, PR 60%, intermediate grade, Tis N0 stage 0    Mrs. Maiden is a pleasant 79 yo woman with right breast ductal carcinoma in situ. We discussed external radiation treatment to the breast after surgery. Possible 4  week course of treatment.  Patient however asked about less aggressive ways to manage her cancer as well and she is not sure that she wishes to proceed with post-operative treatment. We will discuss this further in follow up. We talked about the side effects and risks of radiation therapy. She inquired about MammoSite brachytherapy but given her histology, she is not an ideal candidate for this.  PLAN: Anticipate follow up with Mrs. Nogueira after surgery to re-discuss possible radiation therapy.      This document serves as a record of services personally performed by Kyung Rudd, MD. It was created on his behalf by Arlyce Harman, a trained medical scribe. The creation of this record is based on the scribe's personal observations and the provider's statements to them. This document has been checked and approved by the attending provider. ________________________________   Jodelle Gross, MD, PhD   **Disclaimer: This note was dictated with voice recognition software. Similar sounding words can inadvertently be transcribed and this note may contain transcription errors which may not  have been corrected upon publication of note.**

## 2015-08-24 ENCOUNTER — Telehealth: Payer: Self-pay | Admitting: *Deleted

## 2015-08-24 ENCOUNTER — Telehealth: Payer: Self-pay | Admitting: Hematology and Oncology

## 2015-08-24 NOTE — Progress Notes (Signed)
[place] Psychosocial Distress Screening Spiritual Care  Spiritual Care was referred by distress screening protocol.  The patient scored a 1 on the Psychosocial Distress Thermometer which indicates mild distress. Counseling Intern Vaughan Sine met with pt in [facility] to assess for distress and other psychosocial needs.   ONCBCN DISTRESS SCREENING 08/24/2015  Screening Type Initial Screening  Distress experienced in past week (1-10) 1  Emotional problem type Adjusting to illness  Referral to support programs Yes  Other Counseling Intern Vaughan Sine   Per pt, distress related to dx and uncertainty has resolved.  At the close of [facility], she rated her distress a 0.  Counseling Intern Vaughan Sine provided emotional support and introduction to [facility] team/programming, reviewing resource packet/contact info with pt.  Pt identifies no other needs at this time, but please also page as needs arise.  Thank you.  Follow up needed: No.   Chaplain Lorrin Jackson, [place], [facility] Pager [phone] Voicemail  [phone]

## 2015-08-24 NOTE — Telephone Encounter (Signed)
Spoke to pt concerning [place] from 08/19/15. Denies questions or concerns regarding dx or treatment care plan.  Encourage pt to call with needs. Received verbal understanding. Contact information given.

## 2015-08-24 NOTE — Telephone Encounter (Signed)
lvm for pt regarding to NOV appt....mailed pt appt sched and letter °

## 2015-08-25 ENCOUNTER — Telehealth: Payer: Self-pay | Admitting: *Deleted

## 2015-08-25 ENCOUNTER — Telehealth: Payer: Self-pay | Admitting: Hematology and Oncology

## 2015-08-25 NOTE — Telephone Encounter (Signed)
Pt called and relayed she would like to not see med/rad onc after surgery b/c she is only going to have a lumpectomy. Discussed with pt that our med oncs like to follow pts even if they are not receiving active treatment. Pt relate she will call after surgery to schedule an appt with Dr. Lindi Adie or she may want to see a med/rad onc in [place]. Will f/u back up with pt after surgery.

## 2015-08-25 NOTE — Telephone Encounter (Signed)
pt called back to cx appt...she does not wish to come here she is just going to hav lumpectomy with Dr Dalbert Batman

## 2015-08-31 ENCOUNTER — Other Ambulatory Visit: Payer: Self-pay | Admitting: Internal Medicine

## 2015-09-07 ENCOUNTER — Telehealth: Payer: Self-pay | Admitting: *Deleted

## 2015-09-07 ENCOUNTER — Encounter: Payer: Self-pay | Admitting: Internal Medicine

## 2015-09-07 ENCOUNTER — Ambulatory Visit (INDEPENDENT_AMBULATORY_CARE_PROVIDER_SITE_OTHER): Payer: Medicare Other | Admitting: Internal Medicine

## 2015-09-07 VITALS — HR 91 | Temp 97.7°F | Wt 233.0 lb

## 2015-09-07 DIAGNOSIS — Z23 Encounter for immunization: Secondary | ICD-10-CM | POA: Diagnosis not present

## 2015-09-07 NOTE — Telephone Encounter (Signed)
Confirmed that appt with Dr. Sondra Come was removed

## 2015-09-07 NOTE — Telephone Encounter (Signed)
Received call from pt stating she does not plan on having radiation or to "take that pill" after surgery. She plans on only having a lumpectomy. Will have xrt cancel f/u appt. Informed pt that I would reach out to her after surgery to assess her needs. Received verbal understanding. Denies further questions at this time.

## 2015-09-10 ENCOUNTER — Inpatient Hospital Stay (HOSPITAL_COMMUNITY)
Admission: RE | Admit: 2015-09-10 | Discharge: 2015-09-10 | Disposition: A | Discharge status: Home / Self Care | Payer: Medicare Other | Source: Ambulatory Visit

## 2015-09-10 DIAGNOSIS — N6001 Solitary cyst of right breast: Secondary | ICD-10-CM | POA: Diagnosis not present

## 2015-09-10 DIAGNOSIS — Z853 Personal history of malignant neoplasm of breast: Secondary | ICD-10-CM | POA: Diagnosis not present

## 2015-09-10 DIAGNOSIS — Z0000 Encounter for general adult medical examination without abnormal findings: Secondary | ICD-10-CM | POA: Diagnosis not present

## 2015-09-11 ENCOUNTER — Other Ambulatory Visit: Payer: Self-pay | Admitting: General Surgery

## 2015-09-11 DIAGNOSIS — C50411 Malignant neoplasm of upper-outer quadrant of right female breast: Secondary | ICD-10-CM

## 2015-09-15 ENCOUNTER — Ambulatory Visit (HOSPITAL_COMMUNITY): Admission: RE | Admit: 2015-09-15 | Payer: Medicare Other | Source: Ambulatory Visit | Admitting: General Surgery

## 2015-09-15 ENCOUNTER — Encounter (HOSPITAL_COMMUNITY): Admission: RE | Payer: Self-pay | Source: Ambulatory Visit

## 2015-09-15 SURGERY — BREAST LUMPECTOMY WITH RADIOACTIVE SEED LOCALIZATION
Anesthesia: General | Laterality: Right

## 2015-09-21 ENCOUNTER — Other Ambulatory Visit: Payer: Medicare Other | Admitting: Internal Medicine

## 2015-09-21 ENCOUNTER — Encounter: Payer: Medicare Other | Admitting: Internal Medicine

## 2015-09-23 DIAGNOSIS — N3020 Other chronic cystitis without hematuria: Secondary | ICD-10-CM | POA: Diagnosis not present

## 2015-09-23 DIAGNOSIS — N390 Urinary tract infection, site not specified: Secondary | ICD-10-CM | POA: Diagnosis not present

## 2015-09-23 DIAGNOSIS — N952 Postmenopausal atrophic vaginitis: Secondary | ICD-10-CM | POA: Diagnosis not present

## 2015-09-25 ENCOUNTER — Encounter (HOSPITAL_BASED_OUTPATIENT_CLINIC_OR_DEPARTMENT_OTHER): Payer: Self-pay | Admitting: *Deleted

## 2015-09-28 NOTE — H&P (Signed)
[place] 08/19/2015 2:50 PM Location: [facility] Patient #: [id number] DOB: May 20, 1936 Undefined / Language: Tara [name] / Race: White Female       History of Present Illness  The patient is a 79 year old female who presents with breast cancer. This is a very pleasant 79 year old Caucasian female, referred by Dr. Johnnette Gourd at [facility] mammography for evaluation of DCIS right breast, upper outer quadrant. The patient is being evaluated in the [facility] today by Dr. Payton Mccallum, Dr. Lisbeth Renshaw, and me. Her PCP is Dr. Tommie Ard Baxley. The patient has not had any prior breast problems. She gets annual mammograms. Recent mammograms show a 5 mm area of pleomorphic calcifications in the right breast upper outer quadrant. Image guided biopsy shows intermediate grade ductal carcinoma in situ, estrogen receptor 100%, progesterone receptor 60%.        She states that she has a large hematoma and ecchymoses in the right breast.        Family history reveals breast cancer in her sister at age 62, probably DCIS. A niece had invasive ductal carcinoma. No ovarian cancer. No pancreatic cancer.        Comorbidities include mild obesity, hyperlipidemia, hypothyroidism, hypertension. Multiple orthopedic surgeries including a left total knee replacement. No other major medical problems. She is married. Lives with her husband in [place]. Has 2 grown children. Denies alcohol or tobacco.        We had a long talk about surgical management of her disease. We talked about lumpectomy and mastectomy. Talked about adjuvant therapies. She wants to go ahead and have a lumpectomy but she's not sure about radiation therapy and she's not sure about at adjuvant antiestrogen therapy. She will be scheduled for a right breast lumpectomy with radioactive seed localization. I discussed the indications, details, techniques, and numerous risk of the surgery with her and her husband.  She is aware the risk of bleeding, infection, cosmetic deformity, reoperation for positive margins, skin necrosis, nerve damage with chronic pain. She understands all these issues well. At this time all of her questions are answered. She agrees with this plan. We will hold off on her surgery for about 3 weeks due to the large hematoma.   Other Problems  Arthritis Back Pain Bladder Problems Breast Cancer High blood pressure Lump In Breast Thyroid Disease  Past Surgical History  Breast Biopsy Right. Cataract Surgery Bilateral. Colon Polyp Removal - Colonoscopy Foot Surgery Right. Knee Surgery Left. Oral Surgery Shoulder Surgery Right. Spinal Surgery - Lower Back Tonsillectomy  Diagnostic Studies History  Colonoscopy within last year Mammogram within last year Pap Smear 1-5 years ago  Medication History Conni Slipper, RN; 08/19/2015 2:51 PM) No Current Medications Medications Reconciled  Social History  Caffeine use Tea. No alcohol use No drug use Tobacco use Never smoker.  Family History Arthritis Family Members In General, Sister. Breast Cancer Family Members In General, Sister. Cancer Family Members In General, Sister. Cerebrovascular Accident Sister. Heart Disease Father. Kidney Disease Brother. Melanoma Brother. Respiratory Condition Brother. Thyroid problems Family Members In General.  Pregnancy / Birth History  Age at menarche 47 years. Age of menopause 1-62 Gravida 2 Irregular periods Maternal age 66-20 Para 2    Review of Systems  General Not Present- Appetite Loss, Chills, Fatigue, Fever, Night Sweats, Weight Gain and Weight Loss. Skin Not Present- Change in Wart/Mole, Dryness, Hives, Jaundice, New Lesions, Non-Healing Wounds, Rash and Ulcer. HEENT Present- Wears glasses/contact lenses. Not Present- Earache, Hearing Loss, Hoarseness, Nose Bleed, Oral Ulcers,  Ringing in the Ears, Seasonal Allergies,  Sinus Pain, Sore Throat, Visual Disturbances and Yellow Eyes. Respiratory Present- Snoring. Not Present- Bloody sputum, Chronic Cough, Difficulty Breathing and Wheezing. Breast Not Present- Breast Mass, Breast Pain, Nipple Discharge and Skin Changes. Cardiovascular Present- Leg Cramps and Swelling of Extremities. Not Present- Chest Pain, Difficulty Breathing Lying Down, Palpitations, Rapid Heart Rate and Shortness of Breath. Gastrointestinal Present- Change in Bowel Habits. Not Present- Abdominal Pain, Bloating, Bloody Stool, Chronic diarrhea, Constipation, Difficulty Swallowing, Excessive gas, Gets full quickly at meals, Hemorrhoids, Indigestion, Nausea, Rectal Pain and Vomiting. Female Genitourinary Present- Frequency, Nocturia and Urgency. Not Present- Painful Urination and Pelvic Pain. Musculoskeletal Present- Back Pain and Joint Pain. Not Present- Joint Stiffness, Muscle Pain, Muscle Weakness and Swelling of Extremities. Neurological Present- Weakness. Not Present- Decreased Memory, Fainting, Headaches, Numbness, Seizures, Tingling, Tremor and Trouble walking. Psychiatric Not Present- Anxiety, Bipolar, Change in Sleep Pattern, Depression, Fearful and Frequent crying. Endocrine Present- Heat Intolerance. Not Present- Cold Intolerance, Excessive Hunger, Hair Changes, Hot flashes and New Diabetes. Hematology Present- Easy Bruising. Not Present- Excessive bleeding, Gland problems, HIV and Persistent Infections.   Physical Exam  General Mental Status-Alert. General Appearance-Consistent with stated age. Hydration-Well hydrated. Voice-Normal.  Head and Neck Head-normocephalic, atraumatic with no lesions or palpable masses. Trachea-midline. Thyroid Gland Characteristics - normal size and consistency.  Eye Eyeball - Bilateral-Extraocular movements intact. Sclera/Conjunctiva - Bilateral-No scleral icterus.  Chest and Lung Exam Chest and lung exam reveals -quiet, even and  easy respiratory effort with no use of accessory muscles and on auscultation, normal breath sounds, no adventitious sounds and normal vocal resonance. Inspection Chest Wall - Normal. Back - normal.  Breast Note: Right breast reveals large hematoma central and upper outer quadrant , 7 or 8 cm size. Fresh biopsy site. No infection. No other mass in either breast. No other skin change. No axillary adenopathy.   Cardiovascular Cardiovascular examination reveals -normal heart sounds, regular rate and rhythm with no murmurs and normal pedal pulses bilaterally.  Abdomen Inspection Inspection of the abdomen reveals - No Hernias. Skin - Scar - no surgical scars. Palpation/Percussion Palpation and Percussion of the abdomen reveal - Soft, Non Tender, No Rebound tenderness, No Rigidity (guarding) and No hepatosplenomegaly. Auscultation Auscultation of the abdomen reveals - Bowel sounds normal.  Neurologic Neurologic evaluation reveals -alert and oriented x 3 with no impairment of recent or remote memory. Mental Status-Normal.  Musculoskeletal Normal Exam - Left-Upper Extremity Strength Normal and Lower Extremity Strength Normal. Normal Exam - Right-Upper Extremity Strength Normal and Lower Extremity Strength Normal.  Lymphatic Head & Neck  General Head & Neck Lymphatics: Bilateral - Description - Normal. Axillary  General Axillary Region: Bilateral - Description - Normal. Tenderness - Non Tender. Femoral & Inguinal  Generalized Femoral & Inguinal Lymphatics: Bilateral - Description - Normal. Tenderness - Non Tender.    Assessment & Plan  PRIMARY MALIGNANT NEOPLASM OF UPPER OUTER QUADRANT OF RIGHT BREAST (C50.411)  You are being scheduled for surgery - Our schedulers will call you. Your recent imaging studies and biopsy show a small area of ductal carcinoma in situ of the right breast, upper outer quadrant. Hormone receptors are positive. You have a very large hematoma of  your right breast. This will resolve You have discussed management of your breast cancer with Dr. Lindi Adie, Dr. Lisbeth Renshaw, and me The next step in your care is a right breast lumpectomy. You'll be scheduled for right breast lumpectomy with radioactive seed localization We have discussed the indications, techniques, and  numerous risks of the surgery Please read the written information that we have given you   HYPERTENSION, BENIGN (I10) OBESITY WITH BODY MASS INDEX 30 OR GREATER (E66.9) HISTORY OF COLON POLYPS (Z86.010) HISTORY OF KNEE REPLACEMENT, TOTAL, LEFT (Y84.720)  FAMILY HISTORY OF BREAST CANCER IN FEMALE (Z80.3) Impression: sister -DCIS (65); neice(IDC)    Edsel Petrin. Dalbert Batman, M.D., [facility] General and Minimally invasive Surgery Breast and Colorectal Surgery Office:   [phone] Pager:   [phone]

## 2015-09-29 ENCOUNTER — Encounter (HOSPITAL_BASED_OUTPATIENT_CLINIC_OR_DEPARTMENT_OTHER)
Admission: RE | Admit: 2015-09-29 | Discharge: 2015-09-29 | Disposition: A | Discharge status: Home / Self Care | Payer: Medicare Other | Source: Ambulatory Visit | Attending: Surgery | Admitting: Surgery

## 2015-09-29 ENCOUNTER — Ambulatory Visit: Payer: Medicare Other | Admitting: Hematology and Oncology

## 2015-09-29 ENCOUNTER — Other Ambulatory Visit: Payer: Self-pay

## 2015-09-29 DIAGNOSIS — Z86010 Personal history of colonic polyps: Secondary | ICD-10-CM | POA: Diagnosis not present

## 2015-09-29 DIAGNOSIS — E039 Hypothyroidism, unspecified: Secondary | ICD-10-CM | POA: Diagnosis not present

## 2015-09-29 DIAGNOSIS — I10 Essential (primary) hypertension: Secondary | ICD-10-CM | POA: Diagnosis not present

## 2015-09-29 DIAGNOSIS — N6011 Diffuse cystic mastopathy of right breast: Secondary | ICD-10-CM | POA: Diagnosis not present

## 2015-09-29 DIAGNOSIS — N641 Fat necrosis of breast: Secondary | ICD-10-CM | POA: Diagnosis not present

## 2015-09-29 DIAGNOSIS — Z803 Family history of malignant neoplasm of breast: Secondary | ICD-10-CM | POA: Diagnosis not present

## 2015-09-29 DIAGNOSIS — N6091 Unspecified benign mammary dysplasia of right breast: Secondary | ICD-10-CM | POA: Diagnosis not present

## 2015-09-29 DIAGNOSIS — M1990 Unspecified osteoarthritis, unspecified site: Secondary | ICD-10-CM | POA: Diagnosis not present

## 2015-09-29 DIAGNOSIS — D0511 Intraductal carcinoma in situ of right breast: Secondary | ICD-10-CM | POA: Diagnosis not present

## 2015-09-29 DIAGNOSIS — E669 Obesity, unspecified: Secondary | ICD-10-CM | POA: Diagnosis not present

## 2015-09-29 DIAGNOSIS — Z96652 Presence of left artificial knee joint: Secondary | ICD-10-CM | POA: Diagnosis not present

## 2015-09-29 DIAGNOSIS — Z6841 Body Mass Index (BMI) 40.0 and over, adult: Secondary | ICD-10-CM | POA: Diagnosis not present

## 2015-09-29 DIAGNOSIS — E785 Hyperlipidemia, unspecified: Secondary | ICD-10-CM | POA: Diagnosis not present

## 2015-09-29 LAB — COMPREHENSIVE METABOLIC PANEL
ALT: 23 U/L (ref 14–54)
AST: 27 U/L (ref 15–41)
Albumin: 3.6 g/dL (ref 3.5–5.0)
Alkaline Phosphatase: 81 U/L (ref 38–126)
Anion gap: 7 (ref 5–15)
BUN: 10 mg/dL (ref 6–20)
CO2: 29 mmol/L (ref 22–32)
Calcium: 9.3 mg/dL (ref 8.9–10.3)
Chloride: 104 mmol/L (ref 101–111)
Creatinine, Ser: 0.78 mg/dL (ref 0.44–1.00)
GFR calc Af Amer: >60 mL/min (ref >60)
GFR calc non Af Amer: >60 mL/min (ref >60)
Glucose, Bld: 99 mg/dL (ref 65–99)
Potassium: 4.1 mmol/L (ref 3.5–5.1)
Sodium: 140 mmol/L (ref 135–145)
Total Bilirubin: 0.9 mg/dL (ref 0.3–1.2)
Total Protein: 7.6 g/dL (ref 6.5–8.1)

## 2015-09-29 NOTE — Progress Notes (Addendum)
Dr.Rob Ola Spurr reviewed EKG - ok for surgery. Called and spoke with Robin - Dr. Darrel Hoover Nurse and asked if pt needed another CBC, Diff - overlooked when drawing labs today. Shirlean Mylar said Dr. Dalbert Batman said if anesthesia was [facility] with it. Dr. Rodman Comp reviewed CDC,Diff  From 08/17/2015 - ok for surgery

## 2015-09-30 ENCOUNTER — Encounter (HOSPITAL_BASED_OUTPATIENT_CLINIC_OR_DEPARTMENT_OTHER): Payer: Self-pay | Admitting: *Deleted

## 2015-09-30 ENCOUNTER — Encounter (HOSPITAL_BASED_OUTPATIENT_CLINIC_OR_DEPARTMENT_OTHER)
Admission: RE | Disposition: A | Discharge status: Home / Self Care | Payer: Self-pay | Source: Ambulatory Visit | Attending: General Surgery

## 2015-09-30 ENCOUNTER — Ambulatory Visit (HOSPITAL_BASED_OUTPATIENT_CLINIC_OR_DEPARTMENT_OTHER)
Admission: RE | Admit: 2015-09-30 | Discharge: 2015-09-30 | Disposition: A | Discharge status: Home / Self Care | Payer: Medicare Other | Source: Ambulatory Visit | Attending: General Surgery | Admitting: General Surgery

## 2015-09-30 ENCOUNTER — Ambulatory Visit (HOSPITAL_BASED_OUTPATIENT_CLINIC_OR_DEPARTMENT_OTHER): Payer: Medicare Other | Admitting: Anesthesiology

## 2015-09-30 DIAGNOSIS — D0511 Intraductal carcinoma in situ of right breast: Secondary | ICD-10-CM | POA: Diagnosis not present

## 2015-09-30 DIAGNOSIS — N6011 Diffuse cystic mastopathy of right breast: Secondary | ICD-10-CM | POA: Diagnosis not present

## 2015-09-30 DIAGNOSIS — Z853 Personal history of malignant neoplasm of breast: Secondary | ICD-10-CM | POA: Diagnosis not present

## 2015-09-30 DIAGNOSIS — N6001 Solitary cyst of right breast: Secondary | ICD-10-CM | POA: Diagnosis not present

## 2015-09-30 DIAGNOSIS — N6091 Unspecified benign mammary dysplasia of right breast: Secondary | ICD-10-CM | POA: Insufficient documentation

## 2015-09-30 DIAGNOSIS — E785 Hyperlipidemia, unspecified: Secondary | ICD-10-CM | POA: Diagnosis not present

## 2015-09-30 DIAGNOSIS — Z803 Family history of malignant neoplasm of breast: Secondary | ICD-10-CM | POA: Insufficient documentation

## 2015-09-30 DIAGNOSIS — N6489 Other specified disorders of breast: Secondary | ICD-10-CM | POA: Diagnosis not present

## 2015-09-30 DIAGNOSIS — I10 Essential (primary) hypertension: Secondary | ICD-10-CM | POA: Insufficient documentation

## 2015-09-30 DIAGNOSIS — C50411 Malignant neoplasm of upper-outer quadrant of right female breast: Secondary | ICD-10-CM

## 2015-09-30 DIAGNOSIS — N641 Fat necrosis of breast: Secondary | ICD-10-CM | POA: Diagnosis not present

## 2015-09-30 DIAGNOSIS — C50919 Malignant neoplasm of unspecified site of unspecified female breast: Secondary | ICD-10-CM

## 2015-09-30 DIAGNOSIS — C50911 Malignant neoplasm of unspecified site of right female breast: Secondary | ICD-10-CM | POA: Diagnosis not present

## 2015-09-30 DIAGNOSIS — M1990 Unspecified osteoarthritis, unspecified site: Secondary | ICD-10-CM | POA: Insufficient documentation

## 2015-09-30 DIAGNOSIS — E669 Obesity, unspecified: Secondary | ICD-10-CM | POA: Insufficient documentation

## 2015-09-30 DIAGNOSIS — Z0000 Encounter for general adult medical examination without abnormal findings: Secondary | ICD-10-CM | POA: Diagnosis not present

## 2015-09-30 DIAGNOSIS — Z96652 Presence of left artificial knee joint: Secondary | ICD-10-CM | POA: Insufficient documentation

## 2015-09-30 DIAGNOSIS — Z86010 Personal history of colonic polyps: Secondary | ICD-10-CM | POA: Insufficient documentation

## 2015-09-30 DIAGNOSIS — Z6841 Body Mass Index (BMI) 40.0 and over, adult: Secondary | ICD-10-CM | POA: Insufficient documentation

## 2015-09-30 DIAGNOSIS — E039 Hypothyroidism, unspecified: Secondary | ICD-10-CM | POA: Insufficient documentation

## 2015-09-30 HISTORY — DX: Malignant neoplasm of unspecified site of unspecified female breast: C50.919

## 2015-09-30 HISTORY — DX: Hypothyroidism, unspecified: E03.9

## 2015-09-30 HISTORY — PX: BREAST LUMPECTOMY WITH NEEDLE LOCALIZATION: SHX5759

## 2015-09-30 SURGERY — BREAST LUMPECTOMY WITH NEEDLE LOCALIZATION
Anesthesia: General | Site: Breast | Laterality: Right

## 2015-09-30 MED ORDER — PROMETHAZINE HCL 25 MG/ML IJ SOLN: 6.2500 mg | INTRAMUSCULAR | Status: DC | PRN

## 2015-09-30 MED ORDER — LACTATED RINGERS IV SOLN
INTRAVENOUS | Status: DC
  Administered 2015-09-30: 11:00:00 via INTRAVENOUS

## 2015-09-30 MED ORDER — ONDANSETRON HCL 4 MG/2ML IJ SOLN
INTRAMUSCULAR | Status: DC | PRN
  Administered 2015-09-30: 4 mg via INTRAVENOUS

## 2015-09-30 MED ORDER — SODIUM CHLORIDE 0.9 % IV SOLN: INTRAVENOUS | Status: DC

## 2015-09-30 MED ORDER — CHLORHEXIDINE GLUCONATE 4 % EX LIQD: 1.0000 "application " | Freq: Once | CUTANEOUS | Status: DC

## 2015-09-30 MED ORDER — VANCOMYCIN HCL IN DEXTROSE 1-5 GM/200ML-% IV SOLN
1000.0000 mg | INTRAVENOUS | Status: AC
  Administered 2015-09-30: 1000 mg via INTRAVENOUS

## 2015-09-30 MED ORDER — SODIUM CHLORIDE 0.9 % IJ SOLN: 3.0000 mL | Freq: Two times a day (BID) | INTRAMUSCULAR | Status: DC

## 2015-09-30 MED ORDER — VANCOMYCIN HCL IN DEXTROSE 1-5 GM/200ML-% IV SOLN
INTRAVENOUS | Status: AC
  Filled 2015-09-30: qty 200

## 2015-09-30 MED ORDER — BUPIVACAINE-EPINEPHRINE 0.5% -1:200000 IJ SOLN
INTRAMUSCULAR | Status: DC | PRN
Start: 2015-09-30 — End: 2015-09-30
  Administered 2015-09-30: 10 mL

## 2015-09-30 MED ORDER — GLYCOPYRROLATE 0.2 MG/ML IJ SOLN
0.2000 mg | Freq: Once | INTRAMUSCULAR | Status: AC | PRN
  Administered 2015-09-30: 0.2 mg via INTRAVENOUS

## 2015-09-30 MED ORDER — MIDAZOLAM HCL 2 MG/2ML IJ SOLN: 1.0000 mg | INTRAMUSCULAR | Status: DC | PRN

## 2015-09-30 MED ORDER — ACETAMINOPHEN 650 MG RE SUPP: 650.0000 mg | RECTAL | Status: DC | PRN

## 2015-09-30 MED ORDER — HYDROCODONE-ACETAMINOPHEN 5-325 MG PO TABS: 1.0000 | ORAL_TABLET | Freq: Four times a day (QID) | ORAL | Status: DC | PRN

## 2015-09-30 MED ORDER — VANCOMYCIN HCL IN DEXTROSE 1-5 GM/200ML-% IV SOLN: 1000.0000 mg | INTRAVENOUS | Status: DC

## 2015-09-30 MED ORDER — SCOPOLAMINE 1 MG/3DAYS TD PT72: 1.0000 | MEDICATED_PATCH | Freq: Once | TRANSDERMAL | Status: DC | PRN

## 2015-09-30 MED ORDER — LIDOCAINE HCL (CARDIAC) 20 MG/ML IV SOLN
INTRAVENOUS | Status: DC | PRN
  Administered 2015-09-30: 80 mg via INTRAVENOUS

## 2015-09-30 MED ORDER — FENTANYL CITRATE (PF) 100 MCG/2ML IJ SOLN: 25.0000 ug | INTRAMUSCULAR | Status: DC | PRN

## 2015-09-30 MED ORDER — ACETAMINOPHEN 325 MG PO TABS: 650.0000 mg | ORAL_TABLET | ORAL | Status: DC | PRN

## 2015-09-30 MED ORDER — FENTANYL CITRATE (PF) 100 MCG/2ML IJ SOLN
50.0000 ug | INTRAMUSCULAR | Status: DC | PRN
Start: 2015-09-30 — End: 2015-09-30

## 2015-09-30 MED ORDER — KETOROLAC TROMETHAMINE 30 MG/ML IJ SOLN: 30.0000 mg | Freq: Once | INTRAMUSCULAR | Status: DC

## 2015-09-30 MED ORDER — DEXAMETHASONE SODIUM PHOSPHATE 4 MG/ML IJ SOLN
INTRAMUSCULAR | Status: DC | PRN
  Administered 2015-09-30: 10 mg via INTRAVENOUS

## 2015-09-30 MED ORDER — SODIUM CHLORIDE 0.9 % IJ SOLN: 3.0000 mL | INTRAMUSCULAR | Status: DC | PRN

## 2015-09-30 MED ORDER — SODIUM CHLORIDE 0.9 % IV SOLN: 250.0000 mL | INTRAVENOUS | Status: DC | PRN

## 2015-09-30 MED ORDER — OXYCODONE HCL 5 MG PO TABS: 5.0000 mg | ORAL_TABLET | ORAL | Status: DC | PRN

## 2015-09-30 MED ORDER — PROPOFOL 10 MG/ML IV BOLUS
INTRAVENOUS | Status: DC | PRN
  Administered 2015-09-30: 150 mg via INTRAVENOUS

## 2015-09-30 SURGICAL SUPPLY — 65 items
ADH SKN CLS APL DERMABOND .7 (GAUZE/BANDAGES/DRESSINGS) ×1
APL SKNCLS STERI-STRIP NONHPOA (GAUZE/BANDAGES/DRESSINGS)
APPLIER CLIP 11 MED OPEN (CLIP) ×3
APR CLP MED 11 20 MLT OPN (CLIP) ×1
BANDAGE ELASTIC 6 VELCRO ST LF (GAUZE/BANDAGES/DRESSINGS) IMPLANT
BENZOIN TINCTURE PRP APPL 2/3 (GAUZE/BANDAGES/DRESSINGS) IMPLANT
BLADE HEX COATED 2.75 (ELECTRODE) ×3 IMPLANT
BLADE SURG 10 STRL SS (BLADE) IMPLANT
BLADE SURG 15 STRL LF DISP TIS (BLADE) ×1 IMPLANT
BLADE SURG 15 STRL SS (BLADE) ×3
CANISTER SUCT 1200ML W/VALVE (MISCELLANEOUS) ×3 IMPLANT
CHLORAPREP W/TINT 26ML (MISCELLANEOUS) ×3 IMPLANT
CLIP APPLIE 11 MED OPEN (CLIP) IMPLANT
CLOSURE WOUND 1/2 X4 (GAUZE/BANDAGES/DRESSINGS)
COVER BACK TABLE 60X90IN (DRAPES) ×3 IMPLANT
COVER MAYO STAND STRL (DRAPES) ×3 IMPLANT
DECANTER SPIKE VIAL GLASS SM (MISCELLANEOUS) IMPLANT
DERMABOND ADVANCED (GAUZE/BANDAGES/DRESSINGS) ×2
DERMABOND ADVANCED .7 DNX12 (GAUZE/BANDAGES/DRESSINGS) ×1 IMPLANT
DEVICE DUBIN W/COMP PLATE 8390 (MISCELLANEOUS) ×3 IMPLANT
DRAIN CHANNEL 19F RND (DRAIN) IMPLANT
DRAIN HEMOVAC 1/8 X 5 (WOUND CARE) IMPLANT
DRAPE LAPAROSCOPIC ABDOMINAL (DRAPES) ×3 IMPLANT
DRAPE UTILITY XL STRL (DRAPES) ×3 IMPLANT
DRSG PAD ABDOMINAL 8X10 ST (GAUZE/BANDAGES/DRESSINGS) IMPLANT
ELECT REM PT RETURN 9FT ADLT (ELECTROSURGICAL) ×3
ELECTRODE REM PT RTRN 9FT ADLT (ELECTROSURGICAL) ×1 IMPLANT
EVACUATOR SILICONE 100CC (DRAIN) IMPLANT
GAUZE SPONGE 4X4 12PLY STRL (GAUZE/BANDAGES/DRESSINGS) ×3 IMPLANT
GAUZE SPONGE 4X4 16PLY XRAY LF (GAUZE/BANDAGES/DRESSINGS) IMPLANT
GLOVE BIOGEL PI IND STRL 7.0 (GLOVE) IMPLANT
GLOVE BIOGEL PI INDICATOR 7.0 (GLOVE) ×4
GLOVE ECLIPSE 6.5 STRL STRAW (GLOVE) ×2 IMPLANT
GLOVE EUDERMIC 7 POWDERFREE (GLOVE) ×3 IMPLANT
GOWN STRL REUS W/ TWL LRG LVL3 (GOWN DISPOSABLE) ×1 IMPLANT
GOWN STRL REUS W/ TWL XL LVL3 (GOWN DISPOSABLE) ×1 IMPLANT
GOWN STRL REUS W/TWL LRG LVL3 (GOWN DISPOSABLE) ×3
GOWN STRL REUS W/TWL XL LVL3 (GOWN DISPOSABLE) ×3
KIT MARKER MARGIN INK (KITS) ×2 IMPLANT
NDL HYPO 25X1 1.5 SAFETY (NEEDLE) ×1 IMPLANT
NEEDLE HYPO 25X1 1.5 SAFETY (NEEDLE) ×3 IMPLANT
NS IRRIG 1000ML POUR BTL (IV SOLUTION) ×3 IMPLANT
PACK BASIN DAY SURGERY FS (CUSTOM PROCEDURE TRAY) ×3 IMPLANT
PAD ALCOHOL SWAB (MISCELLANEOUS) ×1 IMPLANT
PENCIL BUTTON HOLSTER BLD 10FT (ELECTRODE) ×3 IMPLANT
PIN SAFETY STERILE (MISCELLANEOUS) IMPLANT
SLEEVE SCD COMPRESS KNEE MED (MISCELLANEOUS) ×2 IMPLANT
SPONGE GAUZE 4X4 12PLY STER LF (GAUZE/BANDAGES/DRESSINGS) ×2 IMPLANT
SPONGE LAP 18X18 X RAY DECT (DISPOSABLE) IMPLANT
SPONGE LAP 4X18 X RAY DECT (DISPOSABLE) ×3 IMPLANT
STRIP CLOSURE SKIN 1/2X4 (GAUZE/BANDAGES/DRESSINGS) IMPLANT
SUT ETHILON 3 0 FSL (SUTURE) IMPLANT
SUT MNCRL AB 4-0 PS2 18 (SUTURE) ×3 IMPLANT
SUT SILK 2 0 SH (SUTURE) ×3 IMPLANT
SUT VIC AB 2-0 CT1 27 (SUTURE)
SUT VIC AB 2-0 CT1 TAPERPNT 27 (SUTURE) IMPLANT
SUT VIC AB 3-0 SH 27 (SUTURE)
SUT VIC AB 3-0 SH 27X BRD (SUTURE) IMPLANT
SUT VICRYL 3-0 CR8 SH (SUTURE) ×3 IMPLANT
SYRINGE 10CC LL (SYRINGE) ×3 IMPLANT
TOWEL OR 17X24 6PK STRL BLUE (TOWEL DISPOSABLE) ×3 IMPLANT
TOWEL OR NON WOVEN STRL DISP B (DISPOSABLE) ×1 IMPLANT
TUBE CONNECTING 20'X1/4 (TUBING) ×1
TUBE CONNECTING 20X1/4 (TUBING) ×2 IMPLANT
YANKAUER SUCT BULB TIP NO VENT (SUCTIONS) ×3 IMPLANT

## 2015-09-30 NOTE — Anesthesia Postprocedure Evaluation (Signed)
Anesthesia Post Note  Patient: [employer]  Procedure(s) Performed: Procedure(s) (LRB): RIGHT BREAST LUMPECTOMY WITH NEEDLE LOCALIZATION (Right)  Anesthesia type: General  Patient location: PACU  Post pain: Pain level controlled and Adequate analgesia  Post assessment: Post-op Vital signs reviewed, Patient's Cardiovascular Status Stable, Respiratory Function Stable, Patent Airway and Pain level controlled  Last Vitals:  Filed Vitals:   09/30/15 1345  BP:   Pulse: 68  Temp:   Resp: 15    Post vital signs: Reviewed and stable  Level of consciousness: awake, alert  and oriented  Complications: No apparent anesthesia complications

## 2015-09-30 NOTE — Op Note (Signed)
Patient Name:           Tara [name]   Date of Surgery:        09/30/2015  Pre op Diagnosis:      Intermediate grade ductal carcinoma in situ right breast, upper outer quadrant, history receptor positive  Post op Diagnosis:    Same  Procedure:                 Right partial mastectomy with needle localization and margin assessment  Surgeon:                     Edsel Petrin. Dalbert Batman, M.D., FACS  Assistant:                      OR staff  Operative Indications:   This is a very pleasant 79 year old Caucasian female, referred by Dr. Johnnette Gourd at [facility] mammography for evaluation of DCIS right breast, upper outer quadrant. The patient was evaluated in the [facility] recently by Dr. Lindi Adie, Dr. Lisbeth Renshaw, and me. Her PCP is Dr. Tommie Ard Baxley. The patient has not had any prior breast problems. She gets annual mammograms. Recent mammograms show a 5 mm area of pleomorphic calcifications in the right breast upper outer quadrant. Image guided biopsy shows intermediate grade ductal carcinoma in situ, estrogen receptor 100%, progesterone receptor 60%.  She had a large hematoma following the biopsy, once and so we waited 4 weeks to do the lumpectomy.  The hematoma has mostly resolved, but there was still enough of the hematoma to where we switched from radioactive seed to wire localization to avoid positioning problems with the seed.  Family history reveals breast cancer in her sister at age 89, probably DCIS. A niece had invasive ductal carcinoma. No ovarian cancer. No pancreatic cancer.   Comorbidities include mild obesity, hyperlipidemia, hypothyroidism, hypertension. Multiple orthopedic surgeries including a left total knee replacement. No other major medical problems. She is married. Lives with her husband in [place]. Has 2 grown children. Denies alcohol or tobacco.    She is brought to the operating room electively  Operative Findings:       The wire localization  looked very good.  The wire was placed at the 9:00 position of the right breast about 4 cm lateral to the areolar margin and went directly into the breast and adjacent to and past the marker clip.  The marker clip was about 2-1/2 cm under the skin of the tip of the wire was about 4-1/2 cm under the skin.  The specimen mammogram looked good showing the marker clip in the center of the specimen.  Procedure in Detail:          The wire placement and the wire localization mammograms were reviewed in the holding area during the patient interviewed.  The patient was taken to the operating room and underwent general anesthesia with LMA device.  The right breast was prepped and draped in a sterile fashion.  Surgical timeout was performed.  Intravenous antibodies were given.  0.5% Marcaine with epinephrine was used as a local infiltration anesthetic.  I made a transverse, narrow elliptical incision at the 9:00 position going slightly around the wire.  We dissected down to the breast tissue circumferentially around the wire.  The specimen was removed and marked with silk sutures and a 6 color ink  kit  to orient the pathologist. The images were reviewed with the pathologist.  The specimen was marked and sent  to the lab.  Hemostasis was excellent and she electrocautery.  The wound was irrigated with saline.  The lumpectomy cavity was marked with about 6 metallic marker clips to orient the radiation oncologist to the lumpectomy cavity.  The breast tissues were closed in several layers with interrupted Vicryl sutures and the skin closed with a running subcuticular suture of 4-0 Monocryl and Dermabond.  Breast binder was placed the patient taken to PACU in stable condition.  EBL 20 mL.  Counts correct.  Complications none.     Edsel Petrin. Dalbert Batman, M.D., FACS General and Minimally Invasive Surgery Breast and Colorectal Surgery  09/30/2015 1:08 PM

## 2015-09-30 NOTE — Interval H&P Note (Signed)
History and Physical Interval Note:  09/30/2015 11:45 AM  Tara [name]  has presented today for surgery, with the diagnosis of ductal carcinoma in situ right breast  The various methods of treatment have been discussed with the patient and family. After consideration of risks, benefits and other options for treatment, the patient has consented to  Procedure(s): RIGHT BREAST LUMPECTOMY WITH NEEDLE LOCALIZATION (Right) as a surgical intervention .  The patient's history has been reviewed, patient examined, no change in status, stable for surgery.  I have reviewed the patient's chart and labs.  Questions were answered to the patient's satisfaction.     Adin Hector

## 2015-09-30 NOTE — Discharge Instructions (Signed)
[facility] °Office Phone Number [phone] ° °BREAST BIOPSY/ PARTIAL MASTECTOMY: POST OP INSTRUCTIONS ° °Always review your discharge instruction sheet given to you by the facility where your surgery was performed. ° °IF YOU HAVE DISABILITY OR FAMILY LEAVE FORMS, YOU MUST BRING THEM TO THE OFFICE FOR PROCESSING.  DO NOT GIVE THEM TO YOUR DOCTOR. ° °1. A prescription for pain medication may be given to you upon discharge.  Take your pain medication as prescribed, if needed.  If narcotic pain medicine is not needed, then you may take acetaminophen (Tylenol) or ibuprofen (Advil) as needed. °2. Take your usually prescribed medications unless otherwise directed °3. If you need a refill on your pain medication, please contact your pharmacy.  They will contact our office to request authorization.  Prescriptions will not be filled after 5pm or on week-ends. °4. You should eat very light the first 24 hours after surgery, such as soup, crackers, pudding, etc.  Resume your normal diet the day after surgery. °5. Most patients will experience some swelling and bruising in the breast.  Ice packs and a good support bra will help.  Swelling and bruising can take several days to resolve.  °6. It is common to experience some constipation if taking pain medication after surgery.  Increasing fluid intake and taking a stool softener will usually help or prevent this problem from occurring.  A mild laxative (Milk of Magnesia or Miralax) should be taken according to package directions if there are no bowel movements after 48 hours. °7. Unless discharge instructions indicate otherwise, you may remove your bandages 24-48 hours after surgery, and you may shower at that time.  You may have steri-strips (small skin tapes) in place directly over the incision.  These strips should be left on the skin for 7-10 days.  If your surgeon used skin glue on the incision, you may shower in 24 hours.  The glue will flake off over the  next 2-3 weeks.  Any sutures or staples will be removed at the office during your follow-up visit. °8. ACTIVITIES:  You may resume regular daily activities (gradually increasing) beginning the next day.  Wearing a good support bra or sports bra minimizes pain and swelling.  You may have sexual intercourse when it is comfortable. °a. You may drive when you no longer are taking prescription pain medication, you can comfortably wear a seatbelt, and you can safely maneuver your car and apply brakes. °b. RETURN TO WORK:  ______________________________________________________________________________________ °9. You should see your doctor in the office for a follow-up appointment approximately two weeks after your surgery.  Your doctor’s nurse will typically make your follow-up appointment when she calls you with your pathology report.  Expect your pathology report 2-3 business days after your surgery.  You may call to check if you do not hear from us after three days. °10. OTHER INSTRUCTIONS: _______________________________________________________________________________________________ _____________________________________________________________________________________________________________________________________ °_____________________________________________________________________________________________________________________________________ °_____________________________________________________________________________________________________________________________________ ° °WHEN TO CALL YOUR DOCTOR: °1. Fever over 101.0 °2. Nausea and/or vomiting. °3. Extreme swelling or bruising. °4. Continued bleeding from incision. °5. Increased pain, redness, or drainage from the incision. ° °The clinic staff is available to answer your questions during regular business hours.  Please don’t hesitate to call and ask to speak to one of the nurses for clinical concerns.  If you have a medical emergency, go to the nearest  emergency room or call 911.  A surgeon from [facility] is always on call at the hospital. ° °For further questions, please visit [email or web address]  ° ° ° °  Post Anesthesia Home Care Instructions ° °Activity: °Get plenty of rest for the remainder of the day. A responsible adult should stay with you for 24 hours following the procedure.  °For the next 24 hours, DO NOT: °-Drive a car °-Operate machinery °-Drink alcoholic beverages °-Take any medication unless instructed by your physician °-Make any legal decisions or sign important papers. ° °Meals: °Start with liquid foods such as gelatin or soup. Progress to regular foods as tolerated. Avoid greasy, spicy, heavy foods. If nausea and/or vomiting occur, drink only clear liquids until the nausea and/or vomiting subsides. Call your physician if vomiting continues. ° °Special Instructions/Symptoms: °Your throat may feel dry or sore from the anesthesia or the breathing tube placed in your throat during surgery. If this causes discomfort, gargle with warm salt water. The discomfort should disappear within 24 hours. ° °If you had a scopolamine patch placed behind your ear for the management of post- operative nausea and/or vomiting: ° °1. The medication in the patch is effective for 72 hours, after which it should be removed.  Wrap patch in a tissue and discard in the trash. Wash hands thoroughly with soap and water. °2. You may remove the patch earlier than 72 hours if you experience unpleasant side effects which may include dry mouth, dizziness or visual disturbances. °3. Avoid touching the patch. Wash your hands with soap and water after contact with the patch. °  ° °

## 2015-09-30 NOTE — Transfer of Care (Signed)
Immediate Anesthesia Transfer of Care Note  Patient: Tara [name]  Procedure(s) Performed: Procedure(s): RIGHT BREAST LUMPECTOMY WITH NEEDLE LOCALIZATION (Right)  Patient Location: PACU  Anesthesia Type:General  Level of Consciousness: awake, sedated and patient cooperative  Airway & Oxygen Therapy: Patient Spontanous Breathing and Patient connected to face mask oxygen  Post-op Assessment: Report given to RN and Post -op Vital signs reviewed and stable  Post vital signs: Reviewed and stable  Last Vitals:  Filed Vitals:   09/30/15 1021  BP: 159/69  Pulse: 84  Temp: 36.6 C  Resp: 20    Complications: No apparent anesthesia complications

## 2015-09-30 NOTE — Anesthesia Procedure Notes (Signed)
Procedure Name: LMA Insertion Date/Time: 09/30/2015 12:09 PM Performed by: Lyndee Leo Pre-anesthesia Checklist: Patient identified, Emergency Drugs available, Suction available and Patient being monitored Patient Re-evaluated:Patient Re-evaluated prior to inductionOxygen Delivery Method: Circle System Utilized Preoxygenation: Pre-oxygenation with 100% oxygen Intubation Type: IV induction Ventilation: Mask ventilation without difficulty LMA: LMA inserted LMA Size: 4.0 Number of attempts: 1 Airway Equipment and Method: Bite block Placement Confirmation: positive ETCO2 Tube secured with: Tape Dental Injury: Teeth and Oropharynx as per pre-operative assessment

## 2015-09-30 NOTE — Anesthesia Preprocedure Evaluation (Signed)
Anesthesia Evaluation  Patient identified by MRN, date of birth, ID band Patient awake    Reviewed: Allergy & Precautions, NPO status , Patient's Chart, lab work & pertinent test results  Airway Mallampati: II  TM Distance: <3 FB Neck ROM: Full    Dental no notable dental hx.    Pulmonary neg pulmonary ROS,    Pulmonary exam normal breath sounds clear to auscultation       Cardiovascular hypertension, Pt. on medications Normal cardiovascular exam Rhythm:Regular Rate:Normal     Neuro/Psych negative neurological ROS  negative psych ROS   GI/Hepatic negative GI ROS, Neg liver ROS,   Endo/Other  Hypothyroidism Morbid obesity  Renal/GU negative Renal ROS  negative genitourinary   Musculoskeletal negative musculoskeletal ROS (+)   Abdominal   Peds negative pediatric ROS (+)  Hematology negative hematology ROS (+)   Anesthesia Other Findings   Reproductive/Obstetrics negative OB ROS                             Anesthesia Physical Anesthesia Plan  ASA: III  Anesthesia Plan: General   Post-op Pain Management:    Induction: Intravenous  Airway Management Planned: LMA  Additional Equipment:   Intra-op Plan:   Post-operative Plan: Extubation in OR  Informed Consent: I have reviewed the patients History and Physical, chart, labs and discussed the procedure including the risks, benefits and alternatives for the proposed anesthesia with the patient or authorized representative who has indicated his/her understanding and acceptance.   Dental advisory given  Plan Discussed with: CRNA and Surgeon  Anesthesia Plan Comments:         Anesthesia Quick Evaluation

## 2015-10-01 ENCOUNTER — Encounter: Payer: Self-pay | Admitting: Women's Health

## 2015-10-01 ENCOUNTER — Encounter (HOSPITAL_BASED_OUTPATIENT_CLINIC_OR_DEPARTMENT_OTHER): Payer: Self-pay | Admitting: General Surgery

## 2015-10-05 ENCOUNTER — Telehealth: Payer: Self-pay | Admitting: *Deleted

## 2015-10-05 NOTE — Telephone Encounter (Signed)
Spoke with patient to follow up post surgery.  She states she is doing well with no complaints.  Discussed follow up with her with Dr. Lisbeth Renshaw and Dr. Lindi Adie.  She states she is only going to follow up with the surgeon at this point.  She would let me know if she changes her mind.  She will see the surgeon on 11/28.  Encouraged her to call with any needs or concerns.

## 2015-10-06 NOTE — Progress Notes (Signed)
Quick Note:  Inform patient of Pathology report,. Have breast surgery partner review path and op note and then call patient. Pre op dx DCIS, but post op is invaasive lobular carcinoma with close but negative margin. Dr. Lindi Adie is med onc and Dr. Lisbeth Renshaw is rad onc. ______

## 2015-10-07 ENCOUNTER — Inpatient Hospital Stay: Admission: RE | Admit: 2015-10-07 | Payer: Self-pay | Source: Ambulatory Visit | Admitting: Radiation Oncology

## 2015-10-07 ENCOUNTER — Ambulatory Visit: Payer: Self-pay | Admitting: Radiation Oncology

## 2015-10-07 ENCOUNTER — Telehealth: Payer: Self-pay | Admitting: Women's Health

## 2015-10-07 NOTE — Telephone Encounter (Signed)
-----   Message from Thamas Jaegers, [place] sent at 10/07/2015  2:14 PM EST ----- Pt said you called her? She deleted the ext. [phone]

## 2015-10-07 NOTE — Telephone Encounter (Signed)
TC Had lumpectomy, at this time is declining further treatment, states is doing well since surgery. Helps to care for her husband.

## 2015-10-28 ENCOUNTER — Telehealth: Payer: Self-pay | Admitting: *Deleted

## 2015-10-28 NOTE — Telephone Encounter (Signed)
Called pt to assess needs after surgery and to see if she is planning on seeing med/rad onc. Pt relate she is doing well and without complaints. Pt is refusing to see a med onc stated "I'm not seeing a medical oncologist and I will not take those pills". Pt request I share that information with her team. Still deciding on if she will see a rad onc. She is going to do her "own research on the second pathology". She is scheduled to see Dr. Dalbert Batman in January and will decide then. Encourage pt to call with needs or questions. Received verbal understanding.

## 2015-10-29 ENCOUNTER — Other Ambulatory Visit: Payer: Self-pay | Admitting: Internal Medicine

## 2015-11-04 ENCOUNTER — Encounter: Payer: Self-pay | Admitting: Women's Health

## 2015-11-04 ENCOUNTER — Ambulatory Visit (INDEPENDENT_AMBULATORY_CARE_PROVIDER_SITE_OTHER): Payer: Medicare Other | Admitting: Women's Health

## 2015-11-04 VITALS — BP 126/78 | Ht 63.0 in | Wt 233.0 lb

## 2015-11-04 DIAGNOSIS — B373 Candidiasis of vulva and vagina: Secondary | ICD-10-CM | POA: Diagnosis not present

## 2015-11-04 DIAGNOSIS — Z01419 Encounter for gynecological examination (general) (routine) without abnormal findings: Secondary | ICD-10-CM | POA: Diagnosis not present

## 2015-11-04 DIAGNOSIS — R350 Frequency of micturition: Secondary | ICD-10-CM | POA: Diagnosis not present

## 2015-11-04 DIAGNOSIS — B3731 Acute candidiasis of vulva and vagina: Secondary | ICD-10-CM

## 2015-11-04 LAB — WET PREP FOR TRICH, YEAST, CLUE
Clue Cells Wet Prep HPF POC: NONE SEEN
Trich, Wet Prep: NONE SEEN
Yeast Wet Prep HPF POC: NONE SEEN

## 2015-11-04 MED ORDER — FLUCONAZOLE 150 MG PO TABS: 150.0000 mg | ORAL_TABLET | Freq: Once | ORAL | Status: DC

## 2015-11-04 NOTE — Patient Instructions (Signed)

## 2015-11-04 NOTE — Progress Notes (Signed)
[place] Son 15-Jun-1936 [id number]    History:    Presents for breast and pelvic exam.  Postmenopausal with no bleeding no HRT. 09/2015  lumpectomy for invasive right breast lobular cancer. Has follow-up scheduled. Normal Pap and mammogram history prior. 2016 DEXA T score -1.4 at femoral neck FRAX 9.2%/1.5%.  Negative colonoscopy. Pneumonia vaccine, has not had Zostavax. Hypertension and hypothyroidism managed by primary care. Has had 6 UTIs in the past year , was treated with a month-long course of Septra in October per Dr. Nydia Bouton and has had good relief of symptoms.  Past medical history, past surgical history, family history and social history were all reviewed and documented in the EPIC chart.  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  ROS:  A ROS was performed and pertinent positives and negatives are included.  Exam:  Filed Vitals:   11/04/15 1201  BP: 126/78    General appearance:  Normal Thyroid:  Symmetrical, normal in size, without palpable masses or nodularity. Respiratory  Auscultation:  Clear without wheezing or rhonchi Cardiovascular  Auscultation:  Regular rate, without rubs, murmurs or gallops  Edema/varicosities:  Not grossly evident Abdominal  Soft,nontender, without masses, guarding or rebound.  Liver/spleen:  No organomegaly noted  Hernia:  None appreciated  Skin  Inspection:  Grossly normal   Breasts: Examined lying and sitting.     Right: Without masses, retractions, discharge or axillary adenopathy.     Left: Without masses, retractions, discharge or axillary adenopathy. Gentitourinary   Inguinal/mons:  Normal without inguinal adenopathy  External genitalia:  Normal  BUS/Urethra/Skene's glands:  Normal  Vagina:  Normal  Cervix:  Normal  Uterus:   normal in size, shape and contour.  Midline and mobile  Adnexa/parametria:     Rt: Without masses or tenderness.   Lt: Without masses or tenderness.  Anus and  perineum: Normal  Digital rectal exam: Normal sphincter tone without palpated masses or tenderness  Assessment/Plan:  79 y.o.  M WF G2 P2 for breast and pelvic exam.  09/2015 -lumpectomy - right breast- invasive lobular cancer follow-up scheduled  hypertension /hypothyroidism-primary care manages labs and meds Osteopenia without elevated FRAX  Postmenopausal on no HRT with no bleeding  Recurrent UTIs urologist managing  Plan: UA. Diflucan 150 times one dose for vaginal itching , wet prep negative itching has occurred after antibiotics. SBE's, keep scheduled follow-up , questioning if she is going to proceed with recommended radiation. Home safety, fall prevention and importance of regular weightbearing exercise reviewed. Vitamin D 1000 daily encouraged. Instructed to have vitamin D level checked at primary care.    Huel Cote [facility], [facility], [facility], 12:43 PM 11/04/2015

## 2015-11-05 ENCOUNTER — Telehealth: Payer: Self-pay | Admitting: *Deleted

## 2015-11-05 LAB — URINALYSIS W MICROSCOPIC + REFLEX CULTURE
Bacteria, UA: NONE SEEN [HPF]
Bilirubin Urine: NEGATIVE
Casts: NONE SEEN [LPF]
Crystals: NONE SEEN [HPF]
Glucose, UA: NEGATIVE
Hgb urine dipstick: NEGATIVE
Ketones, ur: NEGATIVE
Leukocytes, UA: NEGATIVE
Nitrite: NEGATIVE
Protein, ur: NEGATIVE
RBC / HPF: NONE SEEN RBC/HPF (ref <=2)
Specific Gravity, Urine: 1.01 (ref 1.001–1.035)
WBC, UA: NONE SEEN WBC/HPF (ref <=5)
Yeast: NONE SEEN [HPF]
pH: 7 (ref 5.0–8.0)

## 2015-11-05 MED ORDER — NYSTATIN 100000 UNIT/GM EX CREA: TOPICAL_CREAM | CUTANEOUS | Status: DC

## 2015-11-05 NOTE — Telephone Encounter (Signed)
Pt aware Rx sent.  

## 2015-11-05 NOTE — Telephone Encounter (Signed)
Please send rx for nystatin cream twice daily externally as needed

## 2015-11-05 NOTE — Telephone Encounter (Signed)
Pt was seen on OV 11/04/15 was told to purchase nystatin cream over the counter pt states pharmacist told her that she would need Rx. Please advise

## 2015-11-13 ENCOUNTER — Other Ambulatory Visit: Payer: Self-pay | Admitting: Women's Health

## 2015-11-13 NOTE — Telephone Encounter (Signed)
Okay for refill of Diflucan, have her call if no relief

## 2015-11-27 ENCOUNTER — Other Ambulatory Visit: Payer: Self-pay | Admitting: Women's Health

## 2015-11-30 NOTE — Telephone Encounter (Signed)
TC, states does not need diflucan

## 2015-12-08 ENCOUNTER — Encounter: Payer: Self-pay | Admitting: Radiation Oncology

## 2015-12-08 NOTE — Progress Notes (Signed)
Location of Breast Cancer: Right Breast Cancer Upper Outer Quadrant (Ductal Carcinoma in situ)  Histology per Pathology Report: 08/11/15: Right Breast bx: Ductal Carcinoma in situ with calcifications   Receptor Status: ER(100%+), PR (60%+), Her2-neu (neg)  Did patient present with symptoms (if so, please note symptoms) or was this found on screening mammography?:   Past/Anticipated interventions by surgeon, if any:Diagnosis 09/30/2015:Breast, lumpectomy, Right - INVASIVE GRADE I LOBULAR CARCINOMA, SPANNING 0.8 CM IN GREATEST LINEAR DIMENSION.- ASSOCIATED LOBULAR CARCINOMA IN SITU AND ATYPICAL LOBULAR HYPERPLASIA WITH CALCIFICATIONS. - TUMOR CLOSELY APPROACHES LATERAL MARGIN (LESS THAN 0.1 CM) BUT IS NOT PRESENT ON INKED MARGINALSURFACE.- OTHER MARGINS ARE NEGATIVE.- SEE ONCOLOGY TEMPLATE. ADDITIONAL FINDINGS: - FIBROCYSTIC CHANGES WITH USUAL DUCTAL HYPERPLASIA AND CALCIFICATIONS.- BIOPSY SITE CHANGES WITH MARKED FAT NECROSIS AND HEMORRHAGIC BIOPSY CAVITY WITHOUT RESIDUAL DUCTAL CARCINOMA IN SITU IDENTIFIED.- VASCULAR CALCIFICATIONS PRESENT.- BENIGN SKIN.   Past/Anticipated interventions by medical oncology, if any: Chemotherapy : pt cancelled appt with Dr. Gudena  Lymphedema issues, if any:  NO  Pain issues, if any:  NO  SAFETY ISSUES: NO  Prior radiation? NO  Pacemaker/ICD?NO  Possible current pregnancy? NO  Is the patient on methotrexate? NO   Current Complaints / other details:   Seen in Breast Clinic 08/19/15, Married,2 children, non smoker, no alcohol or illicit drug use,  Brother kidney cancer with hx melanoma,deceased, another brother deceased with lung cancer, Father deceased CAD, Mother deceased 2ndary to hemorrhage, One sister hx breast cancer died age 83 with CHF and CVA, another sister breat and uterine cancer? Allergies: Ceclor= Hives    ,  Bruner, RN 12/08/2015,8:40 AM BP 177/80 mmHg  Pulse 84  Temp(Src) 98.3 F (36.8 C) (Oral)  Resp 16  Ht 5' 3"  (1.6 m)  Wt 232 lb 14.4 oz (105.643 kg)  BMI 41.27 kg/m2  Wt Readings from Last 3 Encounters:  12/09/15 232 lb 14.4 oz (105.643 kg)  11/04/15 233 lb (105.688 kg)  09/30/15 227 lb (102.967 kg)    

## 2015-12-09 ENCOUNTER — Ambulatory Visit
Admission: RE | Admit: 2015-12-09 | Discharge: 2015-12-09 | Disposition: A | Discharge status: Home / Self Care | Payer: Medicare Other | Source: Ambulatory Visit | Attending: Radiation Oncology | Admitting: Radiation Oncology

## 2015-12-09 ENCOUNTER — Encounter: Payer: Self-pay | Admitting: Radiation Oncology

## 2015-12-09 VITALS — BP 177/80 | HR 84 | Temp 98.3°F | Resp 16 | Ht 63.0 in | Wt 232.9 lb

## 2015-12-09 DIAGNOSIS — D0511 Intraductal carcinoma in situ of right breast: Secondary | ICD-10-CM

## 2015-12-09 DIAGNOSIS — C50411 Malignant neoplasm of upper-outer quadrant of right female breast: Secondary | ICD-10-CM

## 2015-12-09 DIAGNOSIS — C50911 Malignant neoplasm of unspecified site of right female breast: Secondary | ICD-10-CM | POA: Insufficient documentation

## 2015-12-09 DIAGNOSIS — Z170 Estrogen receptor positive status [ER+]: Secondary | ICD-10-CM | POA: Insufficient documentation

## 2015-12-09 NOTE — Progress Notes (Signed)
Radiation Oncology         [phone] ________________________________  Name: Devanny Reissig MRN: [id number]  Date: 12/09/2015  DOB: 19-Dec-1935  Follow-Up Visit Note  CC: Elby Showers, MD  Fanny Skates, MD  Diagnosis:  Stage I right breast invasive lobular carcinoma  (pT1b, pNX)   Narrative:  The patient returns today for follow-up, she was seen in multidisciplinary breast clinic. She has completed lumpectomy which pathology revealed a Stage I invasive lobular carcinoma, the original biopsy found DCIS. Margins were negative however were only .1 cm from the margin. She now presents for further discussion and coordination of an anticipated course of radiation treatment. The patient's case was presented to medical oncology and they did not recommend any antiestrogen therapy. The patient comes today for further discussion on the role of radiotherapy.  The patient states that she is recovering well from her surgery. She denies any concerns with her incision site or healing. She denies any chest pain, but notes occasional burning pains in her medial right breast. She denies any bowel or bladder dysfuction, abdominal pain, nausea, joint aches or fatigue currently. A complete review of systems is obtained and is otherwise negative.     ALLERGIES:  is allergic to ceclor.  Meds: Current Outpatient Prescriptions  Medication Sig Dispense Refill  . amLODipine (NORVASC) 5 MG tablet TAKE 1 TABLET EVERY DAY 90 tablet 3  . aspirin 81 MG EC tablet Take 81 mg by mouth daily.      . Cholecalciferol (VITAMIN D3) 1000 UNITS CAPS Take 1,000 Units by mouth daily.     . Cranberry (HM CRANBERRY SUPER STRENGTH) 300 MG tablet Take 900 mg by mouth daily.    Marland Kitchen levothyroxine (SYNTHROID, LEVOTHROID) 75 MCG tablet TAKE 1 TABLET EVERY DAY 90 tablet 0  . losartan (COZAAR) 100 MG tablet TAKE 1 TABLET EVERY DAY 90 tablet 3  . nystatin cream (MYCOSTATIN) Apply twice daily externally as needed 30 g 0  . Probiotic  Product (PROBIOTIC DAILY PO) Take by mouth daily.    . potassium chloride SA (K-DUR,KLOR-CON) 20 MEQ tablet Take 1 tablet (20 mEq total) by mouth 2 (two) times daily. (Patient not taking: Reported on 12/09/2015) 180 tablet 3  . torsemide (DEMADEX) 20 MG tablet Take 2 tablets (40 mg total) by mouth every morning. (Patient not taking: Reported on 12/09/2015) 180 tablet 3   No current facility-administered medications for this encounter.    Physical Findings:  height is 5\' 3"  (1.6 m) and weight is 232 lb 14.4 oz (105.643 kg). Her oral temperature is 98.3 F (36.8 C). Her blood pressure is 177/80 and her pulse is 84. Her respiration is 16. .   Pain scale 0/10  In general this is a well appearing caucasian female in no acute distress. She is alert and oriented x4 and appropriate throughout the exam. Cardiovascular exam reveals a regular rate and rhythm. No clicks, rubs, or murmurs are noted. Chest is clear to auscultation bilaterally. Lymphatic review does not reveal any palpable adenopathy of the cervical, supraclavicular, or axillary nodes. The abdomen is soft, nontender, nondistended. Lower extremities are negative for pretibial pitting edema.    Lab Findings: Lab Results  Component Value Date   WBC 6.9 08/17/2015   HGB 12.7 08/17/2015   HCT 38.0 08/17/2015   MCV 95.5 08/17/2015   PLT 311 08/17/2015    Radiographic Findings: No results found.  Impression:     Stage I, invasive lobular carcinoma of the right breast.  Plan:  Dr. Lisbeth Renshaw once again discussed the rationale for radiation treatment in this setting. We discussed the potential benefit of radiation treatment in terms of local/regional control. We also discussed the possible side effects and risks of a possible course of  4 weeks of hypofractionated radiation treatment. All of her questions were answered.  The patient wishes to take some time to decide if she wants to proceed with radiation treatment. She knows to call radiation  oncology to follow up with her decision, and is given contact information to call us.   The patient will be scheduled for a simulation in the near future if she decides to proceed with radiation treatment.   The above documentation reflects my direct findings during this shared patient visit. Please see the separate note by Dr. Lisbeth Renshaw on this date for the remainder of the patient's plan of care.  Carola Rhine, PAC

## 2015-12-09 NOTE — Patient Instructions (Signed)
Contact our office if you have any questions following today's appointment: [phone].  

## 2015-12-09 NOTE — Progress Notes (Signed)
Please see the Nurse Progress Note in the MD Initial Consult Encounter for this patient. 

## 2015-12-11 ENCOUNTER — Telehealth: Payer: Self-pay | Admitting: Radiation Oncology

## 2015-12-11 NOTE — Telephone Encounter (Signed)
Called and spoke with patient's husband to see if his wife was able to speak about her discussion and decision making regarding radiation. She is taking a nap at this time and I will try calling back next week to discuss this.

## 2015-12-15 ENCOUNTER — Telehealth: Payer: Self-pay | Admitting: Radiation Oncology

## 2015-12-15 NOTE — Telephone Encounter (Signed)
I called and spoke with the patient and at this time she is not interested in proceeding with radiation therapy after she got home in read more after meeting with Dr. Lisbeth Renshaw. She states that she will keep our contact information closed by, and will notify us if she changes her mind.

## 2016-01-07 ENCOUNTER — Other Ambulatory Visit: Payer: Self-pay | Admitting: Internal Medicine

## 2016-01-29 ENCOUNTER — Encounter: Payer: Self-pay | Admitting: *Deleted

## 2016-02-03 DIAGNOSIS — N952 Postmenopausal atrophic vaginitis: Secondary | ICD-10-CM | POA: Diagnosis not present

## 2016-02-03 DIAGNOSIS — N3000 Acute cystitis without hematuria: Secondary | ICD-10-CM | POA: Diagnosis not present

## 2016-02-03 DIAGNOSIS — Z0000 Encounter for general adult medical examination without abnormal findings: Secondary | ICD-10-CM | POA: Diagnosis not present

## 2016-02-09 ENCOUNTER — Encounter: Payer: Self-pay | Admitting: Internal Medicine

## 2016-02-09 ENCOUNTER — Ambulatory Visit: Payer: Medicare Other | Admitting: Internal Medicine

## 2016-02-09 ENCOUNTER — Ambulatory Visit (INDEPENDENT_AMBULATORY_CARE_PROVIDER_SITE_OTHER): Payer: Medicare Other | Admitting: Internal Medicine

## 2016-02-09 VITALS — BP 132/76 | HR 67 | Temp 97.2°F | Resp 20 | Ht 63.0 in | Wt 228.0 lb

## 2016-02-09 DIAGNOSIS — N3020 Other chronic cystitis without hematuria: Secondary | ICD-10-CM | POA: Diagnosis not present

## 2016-02-09 DIAGNOSIS — E669 Obesity, unspecified: Secondary | ICD-10-CM

## 2016-02-09 DIAGNOSIS — E119 Type 2 diabetes mellitus without complications: Secondary | ICD-10-CM | POA: Diagnosis not present

## 2016-02-09 DIAGNOSIS — Z853 Personal history of malignant neoplasm of breast: Secondary | ICD-10-CM | POA: Diagnosis not present

## 2016-02-09 DIAGNOSIS — Z86000 Personal history of in-situ neoplasm of breast: Secondary | ICD-10-CM

## 2016-02-09 DIAGNOSIS — E8881 Metabolic syndrome: Secondary | ICD-10-CM

## 2016-02-09 DIAGNOSIS — E039 Hypothyroidism, unspecified: Secondary | ICD-10-CM | POA: Diagnosis not present

## 2016-02-09 DIAGNOSIS — I10 Essential (primary) hypertension: Secondary | ICD-10-CM

## 2016-02-09 DIAGNOSIS — E785 Hyperlipidemia, unspecified: Secondary | ICD-10-CM

## 2016-02-09 LAB — BASIC METABOLIC PANEL
BUN: 11 mg/dL (ref 7–25)
CO2: 25 mmol/L (ref 20–31)
Calcium: 9 mg/dL (ref 8.6–10.4)
Chloride: 106 mmol/L (ref 98–110)
Creat: 0.79 mg/dL (ref 0.60–0.93)
Glucose, Bld: 87 mg/dL (ref 65–99)
Potassium: 4.2 mmol/L (ref 3.5–5.3)
Sodium: 143 mmol/L (ref 135–146)

## 2016-02-09 LAB — TSH: TSH: 1.52 mIU/L

## 2016-02-09 LAB — LIPID PANEL
Cholesterol: 180 mg/dL (ref 125–200)
HDL: 51 mg/dL (ref >=46)
LDL Cholesterol: 110 mg/dL (ref <130)
Total CHOL/HDL Ratio: 3.5 Ratio (ref <=5.0)
Triglycerides: 97 mg/dL (ref <150)
VLDL: 19 mg/dL (ref <30)

## 2016-02-09 NOTE — Progress Notes (Signed)
   Subjective:    Patient ID: Tara [name], female    DOB: Jun 21, 1936, 80 y.o.   MRN: [id number]  HPI She is here today for six-month follow-up. She had invasive grade 1 ductal carcinoma of right breast treated surgically by Dr. Fanny Skates in November 2016. Tumor was ER positive PR positive HER-2/neu negative. She chose not to have radiation therapy.  Has done well post surgery. Her blood pressure is elevated today. She did not take diuretic before coming because she has trouble traveling from [place] to [place] without stopping to urinate if she takes the diuretic. However she needs to do that for next visit. Blood pressure was elevated today at 140/90. Says blood pressures been running pretty good at home in the mid 130s. Her weight is 228 pounds and was 228 pounds in September. She has prediabetes and hypothyroidism in addition to hypertension. Hemoglobin A1c, B- met, lipid panel drawn  Otherwise has no new complaints or problems. Has chronic left knee pain.    Review of Systems     Objective:   Physical Exam  Skin warm and dry. Nodes none. Chest clear to auscultation. Cardiac exam regular rate and rhythm normal S1 and S2. Significant nonpitting edema in both lower extremities      Assessment & Plan:  Essential hypertension-blood pressure elevated 140/90. Return in 2 weeks.  Dependent edema-needs to take diuretic daily. Must take it before coming to office next time for blood pressure check.  Obesity-remains overweight but no weight gain since last visit  Prediabetes-treated with diet. Hemoglobin A1c pending  Hypothyroidism-is on thyroid replacement and TSH is pending  History of ductal carcinoma right breast treated surgically November 2016. Chose not to have radiation therapy.  Plan: In addition to above lab work. Fasting lipid panel obtained. Will return in 2 weeks for blood pressure check.

## 2016-02-09 NOTE — Patient Instructions (Addendum)
Lab work drawn and pending. Return in 2 weeks for office visit, blood pressure check, and please take diuretic before coming to office. Physical exam due in 6 months after September 26th.

## 2016-02-10 LAB — HEMOGLOBIN A1C
Hgb A1c MFr Bld: 5.9 % — ABNORMAL HIGH (ref <5.7)
Mean Plasma Glucose: 123 mg/dL — ABNORMAL HIGH (ref <117)

## 2016-02-12 ENCOUNTER — Telehealth: Payer: Self-pay | Admitting: Internal Medicine

## 2016-02-12 NOTE — Telephone Encounter (Signed)
Patient called to make f/u appointment for her BP check.  States that she checked her BP on Wednesday, 3/22 (after taking her diuretic) and it was 128/69.  She tried to make appointment for 4/3 when her husband has appointment here in [place], but we couldn't come to an agreement on a time that was suitable for her.  She made an appointment for 4/24 @ 2:45 (which is the next time that "they" will be in [place] for an appointment for her husband with his doctor's.  She will monitor her BP's between now and then at least a couple of times.  If it is up, she will call our office.  Advised I would send a note to Dr. Renold Genta to advise of the call.

## 2016-02-12 NOTE — Telephone Encounter (Signed)
Dr Renold Genta advised she's fine with 4/24 as long as patient's BP is staying down.  We are following up per Medicare's guidelines.  Patient's BP seems to be down when she takes her diuretic.  She had not taken it when she was in the office on 3/21.

## 2016-02-22 DIAGNOSIS — Z803 Family history of malignant neoplasm of breast: Secondary | ICD-10-CM | POA: Diagnosis not present

## 2016-02-22 DIAGNOSIS — I10 Essential (primary) hypertension: Secondary | ICD-10-CM | POA: Diagnosis not present

## 2016-02-22 DIAGNOSIS — C50911 Malignant neoplasm of unspecified site of right female breast: Secondary | ICD-10-CM | POA: Diagnosis not present

## 2016-02-22 DIAGNOSIS — Z96652 Presence of left artificial knee joint: Secondary | ICD-10-CM | POA: Diagnosis not present

## 2016-02-22 DIAGNOSIS — Z86010 Personal history of colonic polyps: Secondary | ICD-10-CM | POA: Diagnosis not present

## 2016-02-22 DIAGNOSIS — E669 Obesity, unspecified: Secondary | ICD-10-CM | POA: Diagnosis not present

## 2016-02-22 DIAGNOSIS — C50411 Malignant neoplasm of upper-outer quadrant of right female breast: Secondary | ICD-10-CM | POA: Diagnosis not present

## 2016-03-08 ENCOUNTER — Other Ambulatory Visit: Payer: Self-pay | Admitting: Internal Medicine

## 2016-03-14 ENCOUNTER — Ambulatory Visit: Payer: Medicare Other | Admitting: Internal Medicine

## 2016-03-16 DIAGNOSIS — J209 Acute bronchitis, unspecified: Secondary | ICD-10-CM | POA: Diagnosis not present

## 2016-03-17 ENCOUNTER — Other Ambulatory Visit: Payer: Self-pay | Admitting: Internal Medicine

## 2016-03-18 ENCOUNTER — Other Ambulatory Visit: Payer: Self-pay

## 2016-03-18 MED ORDER — LEVOTHYROXINE SODIUM 75 MCG PO TABS: 75.0000 ug | ORAL_TABLET | Freq: Every day | ORAL | Status: DC

## 2016-04-08 ENCOUNTER — Ambulatory Visit (INDEPENDENT_AMBULATORY_CARE_PROVIDER_SITE_OTHER): Payer: Medicare Other | Admitting: Internal Medicine

## 2016-04-08 ENCOUNTER — Encounter: Payer: Self-pay | Admitting: Internal Medicine

## 2016-04-08 VITALS — BP 122/70 | HR 73 | Temp 98.4°F | Resp 18 | Wt 218.0 lb

## 2016-04-08 DIAGNOSIS — E669 Obesity, unspecified: Secondary | ICD-10-CM | POA: Diagnosis not present

## 2016-04-08 DIAGNOSIS — Z853 Personal history of malignant neoplasm of breast: Secondary | ICD-10-CM | POA: Diagnosis not present

## 2016-04-08 DIAGNOSIS — I10 Essential (primary) hypertension: Secondary | ICD-10-CM | POA: Diagnosis not present

## 2016-04-08 NOTE — Progress Notes (Signed)
   Subjective:    Patient ID: Tara [name], female    DOB: 1936/02/17, 80 y.o.   MRN: [id number]  HPI 80 year old Female in today for follow-up on hypertension. At last visit in March which was a six-month recheck, she had not taken diuretic prior to coming to the office. Today she has had Lasix. Her blood pressure is excellent at 122/70. History of ductal carcinoma in situ. Says she will not take radiation or tamoxifen. She had surgery. Says she is left with a 4 inch scar. She's read a lot on the Internet and doesn't want to take any further treatment.  Also had recent bout of bronchitis treated at urgent care.    Review of Systems     Objective:   Physical Exam  Skin warm and dry. Nodes none. Pharynx is clear. Neck is supple without JVD thyromegaly or carotid bruits. Chest clear to auscultation without rales or wheezing. Extremities without edema.      Assessment & Plan:  Essential hypertension  Status post bronchitis-resolved  Obesity  History of breast cancer  Plan: Patient return after September 26 for physical examination. Last CPE was 08/17/2015. She did not make appointment today. Has GYN appointment in December.

## 2016-04-08 NOTE — Patient Instructions (Signed)
Continue same medications and return late September or early October for physical examination.

## 2016-04-11 DIAGNOSIS — H43393 Other vitreous opacities, bilateral: Secondary | ICD-10-CM | POA: Diagnosis not present

## 2016-05-11 DIAGNOSIS — N3020 Other chronic cystitis without hematuria: Secondary | ICD-10-CM | POA: Diagnosis not present

## 2016-05-23 ENCOUNTER — Other Ambulatory Visit: Payer: Self-pay | Admitting: Internal Medicine

## 2016-05-28 DIAGNOSIS — N3090 Cystitis, unspecified without hematuria: Secondary | ICD-10-CM | POA: Diagnosis not present

## 2016-05-28 DIAGNOSIS — N3001 Acute cystitis with hematuria: Secondary | ICD-10-CM | POA: Diagnosis not present

## 2016-06-21 DIAGNOSIS — N3020 Other chronic cystitis without hematuria: Secondary | ICD-10-CM | POA: Diagnosis not present

## 2016-07-06 ENCOUNTER — Telehealth: Payer: Self-pay | Admitting: *Deleted

## 2016-07-06 NOTE — Telephone Encounter (Signed)
Pt called c/o vaginal odor and achy lower quad discomfort taking OTC tylenol which help, states sometime discomfort worse when standing for long periods of time. No fever, no urinary symptoms, was seen at urology 1 week again and urine culture was negative. I advised pt schedule office visit with provider, will have front desk contact pt to schedule.

## 2016-07-07 ENCOUNTER — Encounter: Payer: Self-pay | Admitting: Women's Health

## 2016-07-07 ENCOUNTER — Ambulatory Visit (INDEPENDENT_AMBULATORY_CARE_PROVIDER_SITE_OTHER): Payer: Medicare Other | Admitting: Women's Health

## 2016-07-07 ENCOUNTER — Other Ambulatory Visit: Payer: Self-pay | Admitting: Women's Health

## 2016-07-07 VITALS — BP 130/78 | Ht 63.0 in | Wt 218.0 lb

## 2016-07-07 DIAGNOSIS — N3001 Acute cystitis with hematuria: Secondary | ICD-10-CM | POA: Diagnosis not present

## 2016-07-07 DIAGNOSIS — N9489 Other specified conditions associated with female genital organs and menstrual cycle: Secondary | ICD-10-CM

## 2016-07-07 DIAGNOSIS — R8279 Other abnormal findings on microbiological examination of urine: Secondary | ICD-10-CM | POA: Diagnosis not present

## 2016-07-07 DIAGNOSIS — R1030 Lower abdominal pain, unspecified: Secondary | ICD-10-CM | POA: Diagnosis not present

## 2016-07-07 DIAGNOSIS — N898 Other specified noninflammatory disorders of vagina: Secondary | ICD-10-CM | POA: Diagnosis not present

## 2016-07-07 LAB — URINALYSIS W MICROSCOPIC + REFLEX CULTURE
Bilirubin Urine: NEGATIVE
Casts: NONE SEEN [LPF]
Crystals: NONE SEEN [HPF]
Glucose, UA: NEGATIVE
Ketones, ur: NEGATIVE
Nitrite: POSITIVE — AB
Protein, ur: NEGATIVE
Specific Gravity, Urine: 1.01 (ref 1.001–1.035)
Yeast: NONE SEEN [HPF]
pH: 6 (ref 5.0–8.0)

## 2016-07-07 LAB — WET PREP FOR TRICH, YEAST, CLUE
Clue Cells Wet Prep HPF POC: NONE SEEN
Trich, Wet Prep: NONE SEEN
Yeast Wet Prep HPF POC: NONE SEEN

## 2016-07-07 MED ORDER — SULFAMETHOXAZOLE-TRIMETHOPRIM 800-160 MG PO TABS: 1.0000 | ORAL_TABLET | Freq: Two times a day (BID) | ORAL | 0 refills | Status: DC

## 2016-07-07 NOTE — Progress Notes (Signed)
Presents with complaint of vaginal odor, pressure/pain on both sides of lower abdomen, urinary urgency. States has some burning with urination. Denies fever or vaginal discharge. Was treated for UTI July 8 at an urgent care with 7 days of Augmentin. Was seen by urologist on July 25 with a negative urine.  Had  lumpectomy for ductal carcinoma in situ right breast earlier this year declined further treatment after.  Exam: Appears well. No CVAT. External genitalia mild erythema, wet prep with a Q-tip negative.Bimanual no CMT, tenderness on right and left side UA: Trace blood, positive nitrites, was read leukocytes, packed WBCs, 3-10 RBCs, many bacteria  UTI Pelvic pain  Plan: Declines Cipro, Septra twice daily for 3 days #6 prescription, proper use given. Urine culture pending. If pain does not resolve, ultrasound, will schedule. Continue to drink fluids, UTI prevention discussed.

## 2016-07-07 NOTE — Patient Instructions (Signed)

## 2016-07-09 LAB — URINE CULTURE: Colony Count: >=100000

## 2016-07-12 ENCOUNTER — Telehealth: Payer: Self-pay | Admitting: *Deleted

## 2016-07-12 MED ORDER — NITROFURANTOIN MONOHYD MACRO 100 MG PO CAPS: 100.0000 mg | ORAL_CAPSULE | Freq: Two times a day (BID) | ORAL | 0 refills | Status: DC

## 2016-07-12 NOTE — Telephone Encounter (Signed)
Please call and review UTI is resistant to both Septra and Augmentin which is what she has already taken. She needs Macrobid twice daily for 7 days to take with food.

## 2016-07-12 NOTE — Telephone Encounter (Signed)
Pt called requesting urine results from 07/07/16, they have not crossed over in epic yet, I asked lab and I received a paper copy, I will placed on your desk to review. Please advise

## 2016-07-12 NOTE — Telephone Encounter (Signed)
Pt informed with the below note, Rx sent. 

## 2016-08-01 ENCOUNTER — Other Ambulatory Visit: Payer: Self-pay | Admitting: Internal Medicine

## 2016-08-01 NOTE — Telephone Encounter (Signed)
Pt is due for PE after Sept 26th please book before refilling

## 2016-08-05 ENCOUNTER — Other Ambulatory Visit: Payer: Self-pay

## 2016-08-05 MED ORDER — LEVOTHYROXINE SODIUM 75 MCG PO TABS: 75.0000 ug | ORAL_TABLET | Freq: Every day | ORAL | 0 refills | Status: DC

## 2016-08-10 DIAGNOSIS — R8271 Bacteriuria: Secondary | ICD-10-CM | POA: Diagnosis not present

## 2016-08-10 DIAGNOSIS — R300 Dysuria: Secondary | ICD-10-CM | POA: Diagnosis not present

## 2016-08-30 ENCOUNTER — Ambulatory Visit (INDEPENDENT_AMBULATORY_CARE_PROVIDER_SITE_OTHER): Payer: Medicare Other | Admitting: Internal Medicine

## 2016-08-30 DIAGNOSIS — Z23 Encounter for immunization: Secondary | ICD-10-CM

## 2016-08-30 NOTE — Patient Instructions (Signed)
Flu vaccine given.

## 2016-08-30 NOTE — Progress Notes (Signed)
Flu vaccine given.

## 2016-09-07 DIAGNOSIS — N3090 Cystitis, unspecified without hematuria: Secondary | ICD-10-CM | POA: Diagnosis not present

## 2016-09-07 DIAGNOSIS — R300 Dysuria: Secondary | ICD-10-CM | POA: Diagnosis not present

## 2016-10-17 ENCOUNTER — Encounter: Payer: Self-pay | Admitting: Internal Medicine

## 2016-10-17 ENCOUNTER — Ambulatory Visit (INDEPENDENT_AMBULATORY_CARE_PROVIDER_SITE_OTHER): Payer: Medicare Other | Admitting: Internal Medicine

## 2016-10-17 VITALS — BP 128/78 | HR 68 | Temp 97.8°F | Ht 63.0 in | Wt 215.0 lb

## 2016-10-17 DIAGNOSIS — Z0000 Encounter for general adult medical examination without abnormal findings: Secondary | ICD-10-CM

## 2016-10-17 DIAGNOSIS — Z6838 Body mass index (BMI) 38.0-38.9, adult: Secondary | ICD-10-CM

## 2016-10-17 DIAGNOSIS — R7303 Prediabetes: Secondary | ICD-10-CM | POA: Diagnosis not present

## 2016-10-17 DIAGNOSIS — E8881 Metabolic syndrome: Secondary | ICD-10-CM

## 2016-10-17 DIAGNOSIS — Z853 Personal history of malignant neoplasm of breast: Secondary | ICD-10-CM

## 2016-10-17 DIAGNOSIS — N390 Urinary tract infection, site not specified: Secondary | ICD-10-CM

## 2016-10-17 DIAGNOSIS — E039 Hypothyroidism, unspecified: Secondary | ICD-10-CM

## 2016-10-17 DIAGNOSIS — F411 Generalized anxiety disorder: Secondary | ICD-10-CM

## 2016-10-17 DIAGNOSIS — E119 Type 2 diabetes mellitus without complications: Secondary | ICD-10-CM | POA: Diagnosis not present

## 2016-10-17 DIAGNOSIS — I10 Essential (primary) hypertension: Secondary | ICD-10-CM

## 2016-10-17 DIAGNOSIS — F439 Reaction to severe stress, unspecified: Secondary | ICD-10-CM | POA: Diagnosis not present

## 2016-10-17 DIAGNOSIS — M170 Bilateral primary osteoarthritis of knee: Secondary | ICD-10-CM | POA: Diagnosis not present

## 2016-10-17 DIAGNOSIS — E7800 Pure hypercholesterolemia, unspecified: Secondary | ICD-10-CM

## 2016-10-17 DIAGNOSIS — Z86010 Personal history of colonic polyps: Secondary | ICD-10-CM

## 2016-10-17 LAB — COMPLETE METABOLIC PANEL WITH GFR
ALT: 19 U/L (ref 6–29)
AST: 25 U/L (ref 10–35)
Albumin: 4.1 g/dL (ref 3.6–5.1)
Alkaline Phosphatase: 84 U/L (ref 33–130)
BUN: 15 mg/dL (ref 7–25)
CO2: 29 mmol/L (ref 20–31)
Calcium: 9.1 mg/dL (ref 8.6–10.4)
Chloride: 104 mmol/L (ref 98–110)
Creat: 0.81 mg/dL (ref 0.60–0.93)
GFR, Est African American: 80 mL/min (ref >=60)
GFR, Est Non African American: 70 mL/min (ref >=60)
Glucose, Bld: 84 mg/dL (ref 65–99)
Potassium: 4.4 mmol/L (ref 3.5–5.3)
Sodium: 142 mmol/L (ref 135–146)
Total Bilirubin: 0.7 mg/dL (ref 0.2–1.2)
Total Protein: 7.2 g/dL (ref 6.1–8.1)

## 2016-10-17 LAB — POCT URINALYSIS DIPSTICK
Bilirubin, UA: NEGATIVE
Blood, UA: NEGATIVE
Glucose, UA: NEGATIVE
Ketones, UA: NEGATIVE
Leukocytes, UA: NEGATIVE
Nitrite, UA: NEGATIVE
Protein, UA: NEGATIVE
Spec Grav, UA: 1.01
Urobilinogen, UA: NEGATIVE
pH, UA: 5

## 2016-10-17 LAB — CBC WITH DIFFERENTIAL/PLATELET
Basophils Absolute: 76 {cells}/uL (ref 0–200)
Basophils Relative: 1 %
Eosinophils Absolute: 228 {cells}/uL (ref 15–500)
Eosinophils Relative: 3 %
HCT: 40.6 % (ref 35.0–45.0)
Hemoglobin: 13.6 g/dL (ref 11.7–15.5)
Lymphocytes Relative: 41 %
Lymphs Abs: 3116 {cells}/uL (ref 850–3900)
MCH: 32.3 pg (ref 27.0–33.0)
MCHC: 33.5 g/dL (ref 32.0–36.0)
MCV: 96.4 fL (ref 80.0–100.0)
MPV: 10.7 fL (ref 7.5–12.5)
Monocytes Absolute: 456 {cells}/uL (ref 200–950)
Monocytes Relative: 6 %
Neutro Abs: 3724 {cells}/uL (ref 1500–7800)
Neutrophils Relative %: 49 %
Platelets: 266 K/uL (ref 140–400)
RBC: 4.21 MIL/uL (ref 3.80–5.10)
RDW: 13.7 % (ref 11.0–15.0)
WBC: 7.6 K/uL (ref 3.8–10.8)

## 2016-10-17 LAB — LIPID PANEL
Cholesterol: 199 mg/dL
HDL: 60 mg/dL
LDL Cholesterol: 121 mg/dL — ABNORMAL HIGH
Total CHOL/HDL Ratio: 3.3 ratio
Triglycerides: 89 mg/dL
VLDL: 18 mg/dL

## 2016-10-17 LAB — HEMOGLOBIN A1C
Hgb A1c MFr Bld: 5.5 %
Mean Plasma Glucose: 111 mg/dL

## 2016-10-17 LAB — TSH: TSH: 2.99 mIU/L
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
## 2016-10-17 NOTE — Patient Instructions (Signed)
It was pleasure to see you today. Fasting labs drawn and pending. Continued work on diet and exercise efforts. Return in 6 months. Lab results will be mailed to you. Please have mammogram.

## 2016-10-18 LAB — MICROALBUMIN, URINE: Microalb, Ur: <0.2 mg/dL

## 2016-10-20 ENCOUNTER — Encounter: Payer: Self-pay | Admitting: Internal Medicine

## 2016-10-20 DIAGNOSIS — Z853 Personal history of malignant neoplasm of breast: Secondary | ICD-10-CM | POA: Diagnosis not present

## 2016-10-28 ENCOUNTER — Other Ambulatory Visit: Payer: Self-pay

## 2016-11-04 ENCOUNTER — Encounter: Payer: Medicare Other | Admitting: Women's Health

## 2016-11-16 ENCOUNTER — Ambulatory Visit (INDEPENDENT_AMBULATORY_CARE_PROVIDER_SITE_OTHER): Payer: Medicare Other | Admitting: Women's Health

## 2016-11-16 ENCOUNTER — Encounter: Payer: Self-pay | Admitting: Women's Health

## 2016-11-16 VITALS — BP 122/84 | Ht 63.0 in | Wt 214.6 lb

## 2016-11-16 DIAGNOSIS — C50411 Malignant neoplasm of upper-outer quadrant of right female breast: Secondary | ICD-10-CM

## 2016-11-16 DIAGNOSIS — Z01419 Encounter for gynecological examination (general) (routine) without abnormal findings: Secondary | ICD-10-CM

## 2016-11-16 DIAGNOSIS — N8110 Cystocele, unspecified: Secondary | ICD-10-CM | POA: Diagnosis not present

## 2016-11-16 DIAGNOSIS — R350 Frequency of micturition: Secondary | ICD-10-CM

## 2016-11-16 DIAGNOSIS — M8588 Other specified disorders of bone density and structure, other site: Secondary | ICD-10-CM | POA: Diagnosis not present

## 2016-11-16 NOTE — Patient Instructions (Signed)

## 2016-11-16 NOTE — Progress Notes (Signed)
[place] 1936-02-23 [id number]    History:    Presents for breast and pelvic exam. Postmenopausal on no HRT with no bleeding. 07/2016 right breast cancer lumpectomy declined any further treatment. Hypertension and hypothyroidism managed by primary care. Normal Pap history. 2016 left femoral neck -1.4, declines follow-up or treatment. 2012 right knee replacement. 09/2014 benign polyp on colonoscopy. Current on vaccines except has not had Zostavax.  Past medical history, past surgical history, family history and social history were all reviewed and documented in the EPIC chart. Husband has numerous health problems, prostate cancer with metastasis, stroke, and diabetes-helps care for him at home.  ROS:  A ROS was performed and pertinent positives and negatives are included.  Exam:  Vitals:   11/16/16 1427  BP: 122/84  Weight: 214 lb 9.6 oz (97.3 kg)  Height: 5\' 3"  (1.6 m)   Body mass index is 38.01 kg/m.   General appearance:  Normal Thyroid:  Symmetrical, normal in size, without palpable masses or nodularity. Respiratory  Auscultation:  Clear without wheezing or rhonchi Cardiovascular  Auscultation:  Regular rate, without rubs, murmurs or gallops  Edema/varicosities:  Bilateral ankle edema-no change per patient Abdominal  Soft,nontender, without masses, guarding or rebound.  Liver/spleen:  No organomegaly noted  Hernia:  None appreciated  Skin  Inspection:  Grossly normal   Breasts: Examined lying and sitting.     Right: Without masses, retractions, discharge or axillary adenopathy.     Left: Without masses, retractions, discharge or axillary adenopathy. Gentitourinary   Inguinal/mons:  Normal without inguinal adenopathy  External genitalia:  Normal  BUS/Urethra/Skene's glands:  Normal  Vagina: +1 cystocele  Cervix:  Normal  Uterus:   normal in size, shape and contour.  Midline and mobile  Adnexa/parametria:     Rt: Without masses or tenderness.   Lt: Without masses  or tenderness.  Anus and perineum: Normal  Digital rectal exam: Normal sphincter tone without palpated masses or tenderness  Assessment/Plan:  80 y.o. MWF G2 P2  for breast and pelvic exam.  Postmenopausal/no HRT/no bleeding +1 cystocele-asymptomatic 11/2015 right breast cancer lumpectomy declined further treatment Osteopenia without elevated FRAX declines further treatment Obesity Hypertension, hypothyroidism-primary care manages labs and meds  Plan: Encouraged Zostavax, instructed to follow-up with primary care. SBE's, report changes, annual screening 3-D mammogram. Home safety, fall prevention discussed. Encouraged to increase exercise.     Huel Cote [facility], 3:28 PM 11/16/2016

## 2016-12-01 DIAGNOSIS — N3020 Other chronic cystitis without hematuria: Secondary | ICD-10-CM | POA: Diagnosis not present

## 2016-12-25 ENCOUNTER — Other Ambulatory Visit: Payer: Self-pay | Admitting: Internal Medicine

## 2017-01-13 DIAGNOSIS — R05 Cough: Secondary | ICD-10-CM | POA: Diagnosis not present

## 2017-01-13 DIAGNOSIS — N3001 Acute cystitis with hematuria: Secondary | ICD-10-CM | POA: Diagnosis not present

## 2017-01-16 DIAGNOSIS — H5203 Hypermetropia, bilateral: Secondary | ICD-10-CM | POA: Diagnosis not present

## 2017-01-16 DIAGNOSIS — H43393 Other vitreous opacities, bilateral: Secondary | ICD-10-CM | POA: Diagnosis not present

## 2017-02-10 DIAGNOSIS — N3091 Cystitis, unspecified with hematuria: Secondary | ICD-10-CM | POA: Diagnosis not present

## 2017-02-10 DIAGNOSIS — N3001 Acute cystitis with hematuria: Secondary | ICD-10-CM | POA: Diagnosis not present

## 2017-02-24 ENCOUNTER — Other Ambulatory Visit: Payer: Self-pay | Admitting: Internal Medicine

## 2017-03-14 DIAGNOSIS — N390 Urinary tract infection, site not specified: Secondary | ICD-10-CM | POA: Diagnosis not present

## 2017-03-14 DIAGNOSIS — N3289 Other specified disorders of bladder: Secondary | ICD-10-CM | POA: Diagnosis not present

## 2017-03-22 DIAGNOSIS — M1711 Unilateral primary osteoarthritis, right knee: Secondary | ICD-10-CM | POA: Diagnosis not present

## 2017-03-24 DIAGNOSIS — K8080 Other cholelithiasis without obstruction: Secondary | ICD-10-CM | POA: Diagnosis not present

## 2017-03-24 DIAGNOSIS — N3289 Other specified disorders of bladder: Secondary | ICD-10-CM | POA: Diagnosis not present

## 2017-03-24 DIAGNOSIS — N390 Urinary tract infection, site not specified: Secondary | ICD-10-CM | POA: Diagnosis not present

## 2017-04-05 ENCOUNTER — Encounter: Payer: Self-pay | Admitting: Gynecology

## 2017-05-08 ENCOUNTER — Other Ambulatory Visit: Payer: Self-pay | Admitting: Internal Medicine

## 2017-05-08 DIAGNOSIS — N390 Urinary tract infection, site not specified: Secondary | ICD-10-CM | POA: Diagnosis not present

## 2017-05-18 DIAGNOSIS — Z803 Family history of malignant neoplasm of breast: Secondary | ICD-10-CM | POA: Diagnosis not present

## 2017-05-18 DIAGNOSIS — Z86010 Personal history of colonic polyps: Secondary | ICD-10-CM | POA: Diagnosis not present

## 2017-05-18 DIAGNOSIS — Z96652 Presence of left artificial knee joint: Secondary | ICD-10-CM | POA: Diagnosis not present

## 2017-05-18 DIAGNOSIS — C50411 Malignant neoplasm of upper-outer quadrant of right female breast: Secondary | ICD-10-CM | POA: Diagnosis not present

## 2017-05-18 DIAGNOSIS — I10 Essential (primary) hypertension: Secondary | ICD-10-CM | POA: Diagnosis not present

## 2017-05-18 DIAGNOSIS — E669 Obesity, unspecified: Secondary | ICD-10-CM | POA: Diagnosis not present

## 2017-05-18 DIAGNOSIS — C50911 Malignant neoplasm of unspecified site of right female breast: Secondary | ICD-10-CM | POA: Diagnosis not present

## 2017-05-31 DIAGNOSIS — N390 Urinary tract infection, site not specified: Secondary | ICD-10-CM | POA: Diagnosis not present

## 2017-06-02 DIAGNOSIS — M1711 Unilateral primary osteoarthritis, right knee: Secondary | ICD-10-CM | POA: Diagnosis not present

## 2017-06-07 ENCOUNTER — Telehealth: Payer: Self-pay | Admitting: Internal Medicine

## 2017-06-07 MED ORDER — DIAZEPAM 5 MG PO TABS: ORAL_TABLET | ORAL | 0 refills | Status: DC

## 2017-06-07 NOTE — Telephone Encounter (Signed)
Call in Valium 5 mg #30 one po daily prn with no refill

## 2017-06-07 NOTE — Telephone Encounter (Signed)
Sent in medication

## 2017-06-07 NOTE — Telephone Encounter (Signed)
Patient wanted to change appointment to 11/27 when husband has an appointment in town.  She then asked if she could have a refill on her Valium that you prescribed about 2 years ago.  States that she only takes it when she absolutely has to have it to go to sleep.  Says that if her husband has a bad night and they have to call the nurse to come to their home she gets very anxious and worked up and then has trouble getting back to sleep.  It's on these occasions when she may take one of the Valium and that has helped her to be able to go back to sleep.  The last Rx that you gave her lasted about 2 years.    I don't see it in her current medications, but she says that you prescribed it in the past.    Pharmacy:  [facility] in [place] # for contact:  [phone]  Thank you.

## 2017-06-15 DIAGNOSIS — N390 Urinary tract infection, site not specified: Secondary | ICD-10-CM | POA: Diagnosis not present

## 2017-06-15 DIAGNOSIS — N3289 Other specified disorders of bladder: Secondary | ICD-10-CM | POA: Diagnosis not present

## 2017-06-26 ENCOUNTER — Other Ambulatory Visit: Payer: Self-pay | Admitting: Internal Medicine

## 2017-06-30 DIAGNOSIS — M1711 Unilateral primary osteoarthritis, right knee: Secondary | ICD-10-CM | POA: Diagnosis not present

## 2017-07-06 DIAGNOSIS — N390 Urinary tract infection, site not specified: Secondary | ICD-10-CM | POA: Diagnosis not present

## 2017-07-06 DIAGNOSIS — N3289 Other specified disorders of bladder: Secondary | ICD-10-CM | POA: Diagnosis not present

## 2017-07-23 ENCOUNTER — Other Ambulatory Visit: Payer: Self-pay | Admitting: Internal Medicine

## 2017-08-02 DIAGNOSIS — M1711 Unilateral primary osteoarthritis, right knee: Secondary | ICD-10-CM | POA: Diagnosis not present

## 2017-08-10 DIAGNOSIS — N390 Urinary tract infection, site not specified: Secondary | ICD-10-CM | POA: Diagnosis not present

## 2017-08-10 DIAGNOSIS — N3289 Other specified disorders of bladder: Secondary | ICD-10-CM | POA: Diagnosis not present

## 2017-08-14 DIAGNOSIS — N390 Urinary tract infection, site not specified: Secondary | ICD-10-CM | POA: Diagnosis not present

## 2017-08-29 DIAGNOSIS — J209 Acute bronchitis, unspecified: Secondary | ICD-10-CM | POA: Diagnosis not present

## 2017-09-04 DIAGNOSIS — N390 Urinary tract infection, site not specified: Secondary | ICD-10-CM | POA: Diagnosis not present

## 2017-09-20 ENCOUNTER — Telehealth: Payer: Self-pay

## 2017-09-20 MED ORDER — DIAZEPAM 5 MG PO TABS: ORAL_TABLET | ORAL | 0 refills | Status: DC

## 2017-09-20 NOTE — Telephone Encounter (Signed)
Pt is aware.  

## 2017-09-20 NOTE — Telephone Encounter (Signed)
Call in Valium 5 mg tablets( #30) Directions: one po tid prn anxiety

## 2017-09-20 NOTE — Telephone Encounter (Signed)
Pt is calling to see if Valium can be sent in for her because her husband is dying and she needs "some help" she says. She says that he only has a week or so left but she needs help to calm her down and get through this. Please advise.

## 2017-09-26 ENCOUNTER — Other Ambulatory Visit: Payer: Self-pay | Admitting: Internal Medicine

## 2017-10-17 ENCOUNTER — Other Ambulatory Visit: Payer: Medicare Other | Admitting: Internal Medicine

## 2017-10-17 ENCOUNTER — Encounter: Payer: Medicare Other | Admitting: Internal Medicine

## 2017-10-19 ENCOUNTER — Encounter: Payer: Self-pay | Admitting: Internal Medicine

## 2017-10-19 ENCOUNTER — Encounter: Payer: Medicare Other | Admitting: Internal Medicine

## 2017-10-19 ENCOUNTER — Ambulatory Visit (INDEPENDENT_AMBULATORY_CARE_PROVIDER_SITE_OTHER): Payer: Medicare Other | Admitting: Internal Medicine

## 2017-10-19 VITALS — BP 140/70 | HR 79 | Temp 98.1°F | Ht 63.0 in | Wt 212.0 lb

## 2017-10-19 DIAGNOSIS — M1711 Unilateral primary osteoarthritis, right knee: Secondary | ICD-10-CM | POA: Diagnosis not present

## 2017-10-19 DIAGNOSIS — E039 Hypothyroidism, unspecified: Secondary | ICD-10-CM | POA: Diagnosis not present

## 2017-10-19 DIAGNOSIS — R7303 Prediabetes: Secondary | ICD-10-CM | POA: Diagnosis not present

## 2017-10-19 DIAGNOSIS — Z0000 Encounter for general adult medical examination without abnormal findings: Secondary | ICD-10-CM

## 2017-10-19 DIAGNOSIS — E785 Hyperlipidemia, unspecified: Secondary | ICD-10-CM | POA: Diagnosis not present

## 2017-10-19 DIAGNOSIS — E8881 Metabolic syndrome: Secondary | ICD-10-CM

## 2017-10-19 DIAGNOSIS — I10 Essential (primary) hypertension: Secondary | ICD-10-CM

## 2017-10-19 DIAGNOSIS — Z96652 Presence of left artificial knee joint: Secondary | ICD-10-CM | POA: Diagnosis not present

## 2017-10-19 DIAGNOSIS — G8929 Other chronic pain: Secondary | ICD-10-CM | POA: Insufficient documentation

## 2017-10-19 DIAGNOSIS — F4321 Adjustment disorder with depressed mood: Secondary | ICD-10-CM | POA: Diagnosis not present

## 2017-10-19 DIAGNOSIS — Z86000 Personal history of in-situ neoplasm of breast: Secondary | ICD-10-CM

## 2017-10-19 DIAGNOSIS — M21061 Valgus deformity, not elsewhere classified, right knee: Secondary | ICD-10-CM | POA: Diagnosis not present

## 2017-10-19 DIAGNOSIS — Z23 Encounter for immunization: Secondary | ICD-10-CM

## 2017-10-19 DIAGNOSIS — R7302 Impaired glucose tolerance (oral): Secondary | ICD-10-CM | POA: Diagnosis not present

## 2017-10-19 DIAGNOSIS — Z6837 Body mass index (BMI) 37.0-37.9, adult: Secondary | ICD-10-CM | POA: Diagnosis not present

## 2017-10-19 DIAGNOSIS — C50411 Malignant neoplasm of upper-outer quadrant of right female breast: Secondary | ICD-10-CM | POA: Diagnosis not present

## 2017-10-19 LAB — POCT URINALYSIS DIPSTICK
Bilirubin, UA: NEGATIVE
Blood, UA: NEGATIVE
Glucose, UA: NEGATIVE
Ketones, UA: NEGATIVE
Leukocytes, UA: NEGATIVE
Nitrite, UA: NEGATIVE
Protein, UA: NEGATIVE
Spec Grav, UA: 1.015 (ref 1.010–1.025)
Urobilinogen, UA: 0.2 E.U./dL
pH, UA: 6.5 (ref 5.0–8.0)

## 2017-10-19 MED ORDER — TRAMADOL HCL 50 MG PO TABS: 50.0000 mg | ORAL_TABLET | Freq: Three times a day (TID) | ORAL | 0 refills | Status: DC | PRN
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
## 2017-10-20 LAB — COMPLETE METABOLIC PANEL WITH GFR
AG Ratio: 1.3 (calc) (ref 1.0–2.5)
ALT: 24 U/L (ref 6–29)
AST: 26 U/L (ref 10–35)
Albumin: 4 g/dL (ref 3.6–5.1)
Alkaline phosphatase (APISO): 76 U/L (ref 33–130)
BUN: 14 mg/dL (ref 7–25)
CO2: 27 mmol/L (ref 20–32)
Calcium: 9.5 mg/dL (ref 8.6–10.4)
Chloride: 103 mmol/L (ref 98–110)
Creat: 0.71 mg/dL (ref 0.60–0.93)
GFR, Est African American: 94 mL/min/{1.73_m2} (ref >=60)
GFR, Est Non African American: 81 mL/min/{1.73_m2} (ref >=60)
Globulin: 3.1 g/dL (calc) (ref 1.9–3.7)
Glucose, Bld: 94 mg/dL (ref 65–99)
Potassium: 4.1 mmol/L (ref 3.5–5.3)
Sodium: 139 mmol/L (ref 135–146)
Total Bilirubin: 0.9 mg/dL (ref 0.2–1.2)
Total Protein: 7.1 g/dL (ref 6.1–8.1)

## 2017-10-20 LAB — CBC WITH DIFFERENTIAL/PLATELET
Basophils Absolute: 141 cells/uL (ref 0–200)
Basophils Relative: 1.7 %
Eosinophils Absolute: 349 cells/uL (ref 15–500)
Eosinophils Relative: 4.2 %
HCT: 40.1 % (ref 35.0–45.0)
Hemoglobin: 13.7 g/dL (ref 11.7–15.5)
Lymphs Abs: 2888 cells/uL (ref 850–3900)
MCH: 31.9 pg (ref 27.0–33.0)
MCHC: 34.2 g/dL (ref 32.0–36.0)
MCV: 93.3 fL (ref 80.0–100.0)
MPV: 10.8 fL (ref 7.5–12.5)
Monocytes Relative: 6.8 %
Neutro Abs: 4358 cells/uL (ref 1500–7800)
Neutrophils Relative %: 52.5 %
Platelets: 293 10*3/uL (ref 140–400)
RBC: 4.3 10*6/uL (ref 3.80–5.10)
RDW: 11.9 % (ref 11.0–15.0)
Total Lymphocyte: 34.8 %
WBC mixed population: 564 cells/uL (ref 200–950)
WBC: 8.3 10*3/uL (ref 3.8–10.8)

## 2017-10-20 LAB — LIPID PANEL
Cholesterol: 212 mg/dL — ABNORMAL HIGH (ref <200)
HDL: 65 mg/dL (ref >50)
LDL Cholesterol (Calc): 126 mg/dL (calc) — ABNORMAL HIGH
Non-HDL Cholesterol (Calc): 147 mg/dL (calc) — ABNORMAL HIGH (ref <130)
Total CHOL/HDL Ratio: 3.3 (calc) (ref <5.0)
Triglycerides: 103 mg/dL (ref <150)

## 2017-10-20 LAB — MICROALBUMIN / CREATININE URINE RATIO
Creatinine, Urine: 77 mg/dL (ref 20–275)
Microalb Creat Ratio: 12 mcg/mg creat (ref <30)
Microalb, Ur: 0.9 mg/dL

## 2017-10-20 LAB — HEMOGLOBIN A1C
Hgb A1c MFr Bld: 5.6 % of total Hgb (ref <5.7)
Mean Plasma Glucose: 114 (calc)
eAG (mmol/L): 6.3 (calc)

## 2017-10-20 LAB — TSH: TSH: 3 mIU/L (ref 0.40–4.50)

## 2017-10-20 NOTE — Patient Instructions (Signed)
To have EKG at preop visit.  Labs drawn and pending.  Form needs to be completed for surgical clearance.  Continue same medications and return in 6 months.  Flu vaccine given.

## 2017-10-23 ENCOUNTER — Other Ambulatory Visit: Payer: Self-pay | Admitting: Orthopedic Surgery

## 2017-10-24 ENCOUNTER — Encounter: Payer: Medicare Other | Admitting: Internal Medicine

## 2017-10-26 ENCOUNTER — Other Ambulatory Visit: Payer: Self-pay | Admitting: Internal Medicine

## 2017-10-26 ENCOUNTER — Other Ambulatory Visit: Payer: Self-pay

## 2017-11-01 ENCOUNTER — Other Ambulatory Visit: Payer: Self-pay

## 2017-11-01 MED ORDER — POTASSIUM CHLORIDE CRYS ER 20 MEQ PO TBCR: 20.0000 meq | EXTENDED_RELEASE_TABLET | Freq: Every day | ORAL | 3 refills | Status: DC

## 2017-11-01 MED ORDER — TORSEMIDE 20 MG PO TABS: 40.0000 mg | ORAL_TABLET | Freq: Every morning | ORAL | 3 refills | Status: DC

## 2017-11-06 ENCOUNTER — Other Ambulatory Visit: Payer: Self-pay

## 2017-11-06 MED ORDER — TORSEMIDE 20 MG PO TABS: 40.0000 mg | ORAL_TABLET | Freq: Every morning | ORAL | 3 refills | Status: DC

## 2017-11-07 ENCOUNTER — Other Ambulatory Visit: Payer: Self-pay

## 2017-11-07 MED ORDER — POTASSIUM CHLORIDE CRYS ER 20 MEQ PO TBCR: 20.0000 meq | EXTENDED_RELEASE_TABLET | Freq: Every day | ORAL | 3 refills | Status: DC

## 2017-12-06 DIAGNOSIS — N390 Urinary tract infection, site not specified: Secondary | ICD-10-CM | POA: Diagnosis not present

## 2017-12-12 ENCOUNTER — Other Ambulatory Visit: Payer: Self-pay

## 2017-12-12 ENCOUNTER — Encounter (HOSPITAL_COMMUNITY)
Admission: RE | Admit: 2017-12-12 | Discharge: 2017-12-12 | Disposition: A | Discharge status: Home / Self Care | Payer: Medicare Other | Source: Ambulatory Visit | Attending: Orthopedic Surgery | Admitting: Orthopedic Surgery

## 2017-12-12 ENCOUNTER — Encounter (HOSPITAL_COMMUNITY): Payer: Self-pay

## 2017-12-12 DIAGNOSIS — R9431 Abnormal electrocardiogram [ECG] [EKG]: Secondary | ICD-10-CM | POA: Insufficient documentation

## 2017-12-12 DIAGNOSIS — Z01812 Encounter for preprocedural laboratory examination: Secondary | ICD-10-CM | POA: Insufficient documentation

## 2017-12-12 DIAGNOSIS — Z01818 Encounter for other preprocedural examination: Secondary | ICD-10-CM | POA: Diagnosis not present

## 2017-12-12 HISTORY — DX: Major depressive disorder, single episode, unspecified: F32.9

## 2017-12-12 HISTORY — DX: Depression, unspecified: F32.A

## 2017-12-12 LAB — CBC WITH DIFFERENTIAL/PLATELET
Basophils Absolute: 0.1 10*3/uL (ref 0.0–0.1)
Basophils Relative: 2 %
Eosinophils Absolute: 0.3 10*3/uL (ref 0.0–0.7)
Eosinophils Relative: 4 %
HCT: 40.6 % (ref 36.0–46.0)
Hemoglobin: 13.7 g/dL (ref 12.0–15.0)
Lymphocytes Relative: 42 %
Lymphs Abs: 3.2 10*3/uL (ref 0.7–4.0)
MCH: 32.5 pg (ref 26.0–34.0)
MCHC: 33.7 g/dL (ref 30.0–36.0)
MCV: 96.2 fL (ref 78.0–100.0)
Monocytes Absolute: 0.5 10*3/uL (ref 0.1–1.0)
Monocytes Relative: 7 %
Neutro Abs: 3.5 10*3/uL (ref 1.7–7.7)
Neutrophils Relative %: 45 %
Platelets: 284 10*3/uL (ref 150–400)
RBC: 4.22 MIL/uL (ref 3.87–5.11)
RDW: 13.2 % (ref 11.5–15.5)
WBC: 7.6 10*3/uL (ref 4.0–10.5)

## 2017-12-12 LAB — COMPREHENSIVE METABOLIC PANEL
ALT: 21 U/L (ref 14–54)
AST: 26 U/L (ref 15–41)
Albumin: 3.7 g/dL (ref 3.5–5.0)
Alkaline Phosphatase: 72 U/L (ref 38–126)
Anion gap: 11 (ref 5–15)
BUN: 9 mg/dL (ref 6–20)
CO2: 23 mmol/L (ref 22–32)
Calcium: 9.4 mg/dL (ref 8.9–10.3)
Chloride: 106 mmol/L (ref 101–111)
Creatinine, Ser: 0.75 mg/dL (ref 0.44–1.00)
GFR calc Af Amer: >60 mL/min (ref >60)
GFR calc non Af Amer: >60 mL/min (ref >60)
Glucose, Bld: 97 mg/dL (ref 65–99)
Potassium: 3.9 mmol/L (ref 3.5–5.1)
Sodium: 140 mmol/L (ref 135–145)
Total Bilirubin: 0.9 mg/dL (ref 0.3–1.2)
Total Protein: 7.3 g/dL (ref 6.5–8.1)

## 2017-12-12 LAB — SURGICAL PCR SCREEN
MRSA, PCR: NEGATIVE
Staphylococcus aureus: POSITIVE — AB

## 2017-12-12 NOTE — Pre-Procedure Instructions (Signed)
[facility]  12/12/2017      [facility] [place], [place] [address] [place] [address] Phone: [phone] Fax: [phone]    Your procedure is scheduled on Monday, Feb. 4th   Report to [facility] Admitting at 6:15 AM             (posted surgery time 8:15am - 9:30am)   Call this number if you have problems the morning of surgery:  [phone]   Remember:   Do not eat food or drink liquids after midnight, Sunday.              4-5 days prior to surgery, STOP TAKING any Vitamins, Herbal Supplements, Anti-inflammatories.   Take these medicines the morning of surgery with A SIP OF WATER : Amlodipine, Diazepam, Levothyroxine   Do not wear jewelry, make-up or nail polish.  Do not wear lotions, powders, perfumes, or deodorant.  Do not shave 48 hours prior to surgery.    Do not bring valuables to the hospital.  [facility] is not responsible for any belongings or valuables.  Contacts, dentures or bridgework may not be worn into surgery.  Leave your suitcase in the car.  After surgery it may be brought to your room.  For patients admitted to the hospital, discharge time will be determined by your treatment team.  Please read over the following fact sheets that you were given. Pain Booklet, MRSA Information and Surgical Site Infection Prevention      [place]- Preparing For Surgery  Before surgery, you can play an important role. Because skin is not sterile, your skin needs to be as free of germs as possible. You can reduce the number of germs on your skin by washing with CHG (chlorahexidine gluconate) Soap before surgery.  CHG is an antiseptic cleaner which kills germs and bonds with the skin to continue killing germs even after washing.  Please do not use if you have an allergy to CHG or antibacterial soaps. If your skin becomes reddened/irritated stop using the CHG.  Do not shave (including legs and underarms)  for at least 48 hours prior to first CHG shower. It is OK to shave your face.  Please follow these instructions carefully.   1. Shower the NIGHT BEFORE SURGERY and the MORNING OF SURGERY with CHG.   2. If you chose to wash your hair, wash your hair first as usual with your normal shampoo.  3. After you shampoo, rinse your hair and body thoroughly to remove the shampoo.  4. Use CHG as you would any other liquid soap. You can apply CHG directly to the skin and wash gently with a scrungie or a clean washcloth.   5. Apply the CHG Soap to your body ONLY FROM THE NECK DOWN.  Do not use on open wounds or open sores. Avoid contact with your eyes, ears, mouth and genitals (private parts). Wash Face and genitals (private parts)  with your normal soap.  6. Wash thoroughly, paying special attention to the area where your surgery will be performed.  7. Thoroughly rinse your body with warm water from the neck down.  8. DO NOT shower/wash with your normal soap after using and rinsing off the CHG Soap.  9. Pat yourself dry with a CLEAN TOWEL.  10. Wear CLEAN PAJAMAS to bed the night before surgery, wear comfortable clothes the morning of surgery  11. Place CLEAN SHEETS on your bed the  night of your first shower and DO NOT SLEEP WITH PETS.    Day of Surgery: Do not apply any deodorants/lotions. Please wear clean clothes to the hospital/surgery center.

## 2017-12-15 ENCOUNTER — Encounter: Payer: Self-pay | Admitting: Internal Medicine

## 2017-12-16 ENCOUNTER — Encounter: Payer: Self-pay | Admitting: Internal Medicine

## 2017-12-18 ENCOUNTER — Other Ambulatory Visit: Payer: Self-pay | Admitting: Internal Medicine

## 2017-12-22 MED ORDER — CLINDAMYCIN PHOSPHATE 900 MG/50ML IV SOLN
900.0000 mg | INTRAVENOUS | Status: AC
  Administered 2017-12-25: 900 mg via INTRAVENOUS
  Filled 2017-12-22: qty 50

## 2017-12-22 MED ORDER — DEXAMETHASONE SODIUM PHOSPHATE 10 MG/ML IJ SOLN
8.0000 mg | Freq: Once | INTRAMUSCULAR | Status: AC
  Administered 2017-12-25: 10 mg via INTRAVENOUS
  Filled 2017-12-22: qty 1

## 2017-12-22 MED ORDER — TRANEXAMIC ACID 1000 MG/10ML IV SOLN
1000.0000 mg | INTRAVENOUS | Status: AC
  Administered 2017-12-25: 1000 mg via INTRAVENOUS
  Filled 2017-12-22: qty 1100

## 2017-12-22 MED ORDER — ACETAMINOPHEN 500 MG PO TABS
1000.0000 mg | ORAL_TABLET | Freq: Once | ORAL | Status: AC
  Administered 2017-12-25: 1000 mg via ORAL
  Filled 2017-12-22: qty 2

## 2017-12-22 MED ORDER — BUPIVACAINE LIPOSOME 1.3 % IJ SUSP
20.0000 mL | INTRAMUSCULAR | Status: AC
  Administered 2017-12-25: 20 mL
  Filled 2017-12-22: qty 20

## 2017-12-22 MED ORDER — GABAPENTIN 300 MG PO CAPS
300.0000 mg | ORAL_CAPSULE | Freq: Once | ORAL | Status: AC
  Administered 2017-12-25: 300 mg via ORAL
  Filled 2017-12-22: qty 1

## 2017-12-25 ENCOUNTER — Encounter (HOSPITAL_COMMUNITY): Payer: Self-pay | Admitting: Certified Registered Nurse Anesthetist

## 2017-12-25 ENCOUNTER — Ambulatory Visit (HOSPITAL_COMMUNITY): Payer: Medicare Other | Admitting: Certified Registered Nurse Anesthetist

## 2017-12-25 ENCOUNTER — Encounter (HOSPITAL_COMMUNITY)
Admission: RE | Disposition: A | Discharge status: Home / Self Care | Payer: Self-pay | Source: Ambulatory Visit | Attending: Orthopedic Surgery

## 2017-12-25 ENCOUNTER — Observation Stay (HOSPITAL_COMMUNITY)
Admission: RE | Admit: 2017-12-25 | Discharge: 2017-12-26 | Disposition: A | Discharge status: Home / Self Care | Payer: Medicare Other | Source: Ambulatory Visit | Attending: Orthopedic Surgery | Admitting: Orthopedic Surgery

## 2017-12-25 DIAGNOSIS — Z881 Allergy status to other antibiotic agents status: Secondary | ICD-10-CM | POA: Diagnosis not present

## 2017-12-25 DIAGNOSIS — H8110 Benign paroxysmal vertigo, unspecified ear: Secondary | ICD-10-CM | POA: Insufficient documentation

## 2017-12-25 DIAGNOSIS — M1711 Unilateral primary osteoarthritis, right knee: Principal | ICD-10-CM | POA: Insufficient documentation

## 2017-12-25 DIAGNOSIS — Z86010 Personal history of colonic polyps: Secondary | ICD-10-CM | POA: Diagnosis not present

## 2017-12-25 DIAGNOSIS — E039 Hypothyroidism, unspecified: Secondary | ICD-10-CM | POA: Diagnosis not present

## 2017-12-25 DIAGNOSIS — Z6838 Body mass index (BMI) 38.0-38.9, adult: Secondary | ICD-10-CM | POA: Diagnosis not present

## 2017-12-25 DIAGNOSIS — R32 Unspecified urinary incontinence: Secondary | ICD-10-CM | POA: Diagnosis not present

## 2017-12-25 DIAGNOSIS — Z801 Family history of malignant neoplasm of trachea, bronchus and lung: Secondary | ICD-10-CM | POA: Insufficient documentation

## 2017-12-25 DIAGNOSIS — Z8051 Family history of malignant neoplasm of kidney: Secondary | ICD-10-CM | POA: Diagnosis not present

## 2017-12-25 DIAGNOSIS — R52 Pain, unspecified: Secondary | ICD-10-CM | POA: Diagnosis not present

## 2017-12-25 DIAGNOSIS — F329 Major depressive disorder, single episode, unspecified: Secondary | ICD-10-CM | POA: Insufficient documentation

## 2017-12-25 DIAGNOSIS — Z803 Family history of malignant neoplasm of breast: Secondary | ICD-10-CM | POA: Diagnosis not present

## 2017-12-25 DIAGNOSIS — I10 Essential (primary) hypertension: Secondary | ICD-10-CM | POA: Diagnosis not present

## 2017-12-25 DIAGNOSIS — E669 Obesity, unspecified: Secondary | ICD-10-CM | POA: Diagnosis not present

## 2017-12-25 DIAGNOSIS — Z96659 Presence of unspecified artificial knee joint: Secondary | ICD-10-CM

## 2017-12-25 DIAGNOSIS — Z7982 Long term (current) use of aspirin: Secondary | ICD-10-CM | POA: Diagnosis not present

## 2017-12-25 DIAGNOSIS — Z853 Personal history of malignant neoplasm of breast: Secondary | ICD-10-CM | POA: Insufficient documentation

## 2017-12-25 DIAGNOSIS — Z79899 Other long term (current) drug therapy: Secondary | ICD-10-CM | POA: Insufficient documentation

## 2017-12-25 DIAGNOSIS — Z96611 Presence of right artificial shoulder joint: Secondary | ICD-10-CM | POA: Insufficient documentation

## 2017-12-25 DIAGNOSIS — Z8249 Family history of ischemic heart disease and other diseases of the circulatory system: Secondary | ICD-10-CM | POA: Insufficient documentation

## 2017-12-25 DIAGNOSIS — M7120 Synovial cyst of popliteal space [Baker], unspecified knee: Secondary | ICD-10-CM | POA: Diagnosis not present

## 2017-12-25 DIAGNOSIS — R7303 Prediabetes: Secondary | ICD-10-CM | POA: Insufficient documentation

## 2017-12-25 DIAGNOSIS — Z8049 Family history of malignant neoplasm of other genital organs: Secondary | ICD-10-CM | POA: Insufficient documentation

## 2017-12-25 HISTORY — PX: TOTAL KNEE ARTHROPLASTY: SHX125

## 2017-12-25 SURGERY — ARTHROPLASTY, KNEE, TOTAL
Anesthesia: Monitor Anesthesia Care | Laterality: Right

## 2017-12-25 MED ORDER — ONDANSETRON HCL 4 MG PO TABS: 4.0000 mg | ORAL_TABLET | Freq: Four times a day (QID) | ORAL | Status: DC | PRN

## 2017-12-25 MED ORDER — MENTHOL 3 MG MT LOZG: 1.0000 | LOZENGE | OROMUCOSAL | Status: DC | PRN

## 2017-12-25 MED ORDER — HYDROMORPHONE HCL 1 MG/ML IJ SOLN: 0.2500 mg | INTRAMUSCULAR | Status: DC | PRN

## 2017-12-25 MED ORDER — METOCLOPRAMIDE HCL 5 MG/ML IJ SOLN: 5.0000 mg | Freq: Three times a day (TID) | INTRAMUSCULAR | Status: DC | PRN

## 2017-12-25 MED ORDER — ACETAMINOPHEN 325 MG PO TABS: 650.0000 mg | ORAL_TABLET | ORAL | Status: DC | PRN

## 2017-12-25 MED ORDER — ALUM & MAG HYDROXIDE-SIMETH 200-200-20 MG/5ML PO SUSP: 30.0000 mL | ORAL | Status: DC | PRN

## 2017-12-25 MED ORDER — SENNOSIDES-DOCUSATE SODIUM 8.6-50 MG PO TABS: 1.0000 | ORAL_TABLET | Freq: Every evening | ORAL | Status: DC | PRN

## 2017-12-25 MED ORDER — LOSARTAN POTASSIUM 50 MG PO TABS
100.0000 mg | ORAL_TABLET | Freq: Every day | ORAL | Status: DC
  Administered 2017-12-25 – 2017-12-26 (×2): 100 mg via ORAL
  Filled 2017-12-25 (×2): qty 2

## 2017-12-25 MED ORDER — GABAPENTIN 300 MG PO CAPS
300.0000 mg | ORAL_CAPSULE | Freq: Three times a day (TID) | ORAL | Status: DC
  Administered 2017-12-25 – 2017-12-26 (×3): 300 mg via ORAL
  Filled 2017-12-25 (×3): qty 1

## 2017-12-25 MED ORDER — FLEET ENEMA 7-19 GM/118ML RE ENEM: 1.0000 | ENEMA | Freq: Once | RECTAL | Status: DC | PRN

## 2017-12-25 MED ORDER — AMLODIPINE BESYLATE 5 MG PO TABS
5.0000 mg | ORAL_TABLET | Freq: Every day | ORAL | Status: DC
  Administered 2017-12-26: 5 mg via ORAL
  Filled 2017-12-25: qty 1

## 2017-12-25 MED ORDER — BUPIVACAINE-EPINEPHRINE 0.25% -1:200000 IJ SOLN
INTRAMUSCULAR | Status: AC
  Filled 2017-12-25: qty 1

## 2017-12-25 MED ORDER — CELECOXIB 200 MG PO CAPS
200.0000 mg | ORAL_CAPSULE | Freq: Two times a day (BID) | ORAL | Status: DC
  Administered 2017-12-25 – 2017-12-26 (×3): 200 mg via ORAL
  Filled 2017-12-25 (×3): qty 1

## 2017-12-25 MED ORDER — LACTATED RINGERS IV SOLN
INTRAVENOUS | Status: DC | PRN
  Administered 2017-12-25: 08:00:00 via INTRAVENOUS

## 2017-12-25 MED ORDER — CHLORHEXIDINE GLUCONATE 4 % EX LIQD: 60.0000 mL | Freq: Once | CUTANEOUS | Status: DC

## 2017-12-25 MED ORDER — LEVOTHYROXINE SODIUM 75 MCG PO TABS
75.0000 ug | ORAL_TABLET | Freq: Every day | ORAL | Status: DC
  Administered 2017-12-26: 75 ug via ORAL
  Filled 2017-12-25: qty 1

## 2017-12-25 MED ORDER — BUPIVACAINE-EPINEPHRINE (PF) 0.25% -1:200000 IJ SOLN
INTRAMUSCULAR | Status: DC | PRN
  Administered 2017-12-25: 30 mL

## 2017-12-25 MED ORDER — ONDANSETRON HCL 4 MG/2ML IJ SOLN: 4.0000 mg | Freq: Four times a day (QID) | INTRAMUSCULAR | Status: DC | PRN

## 2017-12-25 MED ORDER — METHOCARBAMOL 1000 MG/10ML IJ SOLN: 500.0000 mg | Freq: Four times a day (QID) | INTRAVENOUS | Status: DC | PRN

## 2017-12-25 MED ORDER — DOCUSATE SODIUM 100 MG PO CAPS
100.0000 mg | ORAL_CAPSULE | Freq: Two times a day (BID) | ORAL | Status: DC
  Administered 2017-12-25 – 2017-12-26 (×3): 100 mg via ORAL
  Filled 2017-12-25 (×3): qty 1

## 2017-12-25 MED ORDER — OXYCODONE HCL 5 MG PO TABS: 5.0000 mg | ORAL_TABLET | ORAL | Status: DC | PRN

## 2017-12-25 MED ORDER — FENTANYL CITRATE (PF) 250 MCG/5ML IJ SOLN
INTRAMUSCULAR | Status: AC
  Filled 2017-12-25: qty 5

## 2017-12-25 MED ORDER — BUPIVACAINE-EPINEPHRINE 0.5% -1:200000 IJ SOLN: INTRAMUSCULAR | Status: DC | PRN

## 2017-12-25 MED ORDER — TRANEXAMIC ACID 1000 MG/10ML IV SOLN
1000.0000 mg | Freq: Once | INTRAVENOUS | Status: AC
  Administered 2017-12-25: 1000 mg via INTRAVENOUS
  Filled 2017-12-25: qty 10

## 2017-12-25 MED ORDER — PHENYLEPHRINE HCL 10 MG/ML IJ SOLN
INTRAVENOUS | Status: DC | PRN
  Administered 2017-12-25: 20 ug/min via INTRAVENOUS

## 2017-12-25 MED ORDER — MIDAZOLAM HCL 2 MG/2ML IJ SOLN
1.0000 mg | INTRAMUSCULAR | Status: DC | PRN
  Administered 2017-12-25: 2 mg via INTRAVENOUS

## 2017-12-25 MED ORDER — HYDROMORPHONE HCL 1 MG/ML IJ SOLN: 1.0000 mg | INTRAMUSCULAR | Status: DC | PRN

## 2017-12-25 MED ORDER — METOCLOPRAMIDE HCL 5 MG PO TABS: 5.0000 mg | ORAL_TABLET | Freq: Three times a day (TID) | ORAL | Status: DC | PRN

## 2017-12-25 MED ORDER — BUPIVACAINE IN DEXTROSE 0.75-8.25 % IT SOLN
INTRATHECAL | Status: DC | PRN
  Administered 2017-12-25: 2 mL via INTRATHECAL

## 2017-12-25 MED ORDER — ONDANSETRON HCL 4 MG/2ML IJ SOLN
INTRAMUSCULAR | Status: DC | PRN
  Administered 2017-12-25: 4 mg via INTRAVENOUS

## 2017-12-25 MED ORDER — METHOCARBAMOL 500 MG PO TABS
500.0000 mg | ORAL_TABLET | Freq: Four times a day (QID) | ORAL | Status: DC | PRN
  Administered 2017-12-26 (×2): 500 mg via ORAL
  Filled 2017-12-25 (×2): qty 1

## 2017-12-25 MED ORDER — MIDAZOLAM HCL 2 MG/2ML IJ SOLN
INTRAMUSCULAR | Status: AC
  Filled 2017-12-25: qty 2

## 2017-12-25 MED ORDER — SODIUM CHLORIDE 0.9 % IR SOLN
Status: DC | PRN
  Administered 2017-12-25: 3000 mL

## 2017-12-25 MED ORDER — CLINDAMYCIN PHOSPHATE 600 MG/50ML IV SOLN
600.0000 mg | Freq: Four times a day (QID) | INTRAVENOUS | Status: AC
  Administered 2017-12-25 (×2): 600 mg via INTRAVENOUS
  Filled 2017-12-25 (×2): qty 50

## 2017-12-25 MED ORDER — FENTANYL CITRATE (PF) 100 MCG/2ML IJ SOLN
INTRAMUSCULAR | Status: AC
  Filled 2017-12-25: qty 2

## 2017-12-25 MED ORDER — PROPOFOL 10 MG/ML IV BOLUS
INTRAVENOUS | Status: AC
  Filled 2017-12-25: qty 20

## 2017-12-25 MED ORDER — DIPHENHYDRAMINE HCL 12.5 MG/5ML PO ELIX: 12.5000 mg | ORAL_SOLUTION | ORAL | Status: DC | PRN

## 2017-12-25 MED ORDER — PROPOFOL 500 MG/50ML IV EMUL
INTRAVENOUS | Status: DC | PRN
  Administered 2017-12-25: 30 ug/kg/min via INTRAVENOUS

## 2017-12-25 MED ORDER — TORSEMIDE 20 MG PO TABS
40.0000 mg | ORAL_TABLET | Freq: Once | ORAL | Status: AC
  Administered 2017-12-25: 40 mg via ORAL
  Filled 2017-12-25: qty 2

## 2017-12-25 MED ORDER — HYDROCODONE-ACETAMINOPHEN 7.5-325 MG PO TABS
1.0000 | ORAL_TABLET | Freq: Four times a day (QID) | ORAL | Status: DC
  Administered 2017-12-25 – 2017-12-26 (×4): 1 via ORAL
  Filled 2017-12-25 (×4): qty 1

## 2017-12-25 MED ORDER — ZOLPIDEM TARTRATE 5 MG PO TABS: 5.0000 mg | ORAL_TABLET | Freq: Every evening | ORAL | Status: DC | PRN

## 2017-12-25 MED ORDER — SODIUM CHLORIDE 0.9 % IJ SOLN
INTRAMUSCULAR | Status: DC | PRN
  Administered 2017-12-25: 20 mL

## 2017-12-25 MED ORDER — DEXAMETHASONE SODIUM PHOSPHATE 10 MG/ML IJ SOLN
10.0000 mg | Freq: Once | INTRAMUSCULAR | Status: AC
  Administered 2017-12-26: 10 mg via INTRAVENOUS
  Filled 2017-12-25: qty 1

## 2017-12-25 MED ORDER — ASPIRIN EC 325 MG PO TBEC
325.0000 mg | DELAYED_RELEASE_TABLET | Freq: Two times a day (BID) | ORAL | Status: DC
  Administered 2017-12-25 – 2017-12-26 (×3): 325 mg via ORAL
  Filled 2017-12-25 (×3): qty 1

## 2017-12-25 MED ORDER — BISACODYL 5 MG PO TBEC: 5.0000 mg | DELAYED_RELEASE_TABLET | Freq: Every day | ORAL | Status: DC | PRN

## 2017-12-25 MED ORDER — PROPOFOL 10 MG/ML IV BOLUS
INTRAVENOUS | Status: DC | PRN
  Administered 2017-12-25 (×2): 20 mg via INTRAVENOUS

## 2017-12-25 MED ORDER — FENTANYL CITRATE (PF) 250 MCG/5ML IJ SOLN
INTRAMUSCULAR | Status: DC | PRN
  Administered 2017-12-25: 50 ug via INTRAVENOUS

## 2017-12-25 MED ORDER — PHENOL 1.4 % MT LIQD: 1.0000 | OROMUCOSAL | Status: DC | PRN

## 2017-12-25 MED ORDER — MIDAZOLAM HCL 2 MG/2ML IJ SOLN
INTRAMUSCULAR | Status: AC
  Administered 2017-12-25: 2 mg via INTRAVENOUS
  Filled 2017-12-25: qty 2

## 2017-12-25 MED ORDER — ONDANSETRON HCL 4 MG/2ML IJ SOLN: 4.0000 mg | Freq: Once | INTRAMUSCULAR | Status: DC | PRN

## 2017-12-25 MED ORDER — MEPERIDINE HCL 25 MG/ML IJ SOLN: 6.2500 mg | INTRAMUSCULAR | Status: DC | PRN

## 2017-12-25 MED ORDER — DIAZEPAM 5 MG PO TABS: 5.0000 mg | ORAL_TABLET | Freq: Every day | ORAL | Status: DC | PRN

## 2017-12-25 MED ORDER — POTASSIUM CHLORIDE CRYS ER 20 MEQ PO TBCR: 20.0000 meq | EXTENDED_RELEASE_TABLET | Freq: Two times a day (BID) | ORAL | Status: DC | PRN

## 2017-12-25 MED ORDER — ACETAMINOPHEN 650 MG RE SUPP: 650.0000 mg | RECTAL | Status: DC | PRN

## 2017-12-25 SURGICAL SUPPLY — 62 items
BANDAGE ACE 6X5 VEL STRL LF (GAUZE/BANDAGES/DRESSINGS) ×3 IMPLANT
BANDAGE ESMARK 6X9 LF (GAUZE/BANDAGES/DRESSINGS) ×1 IMPLANT
BLADE SAGITTAL 13X1.27X60 (BLADE) ×2 IMPLANT
BLADE SAGITTAL 13X1.27X60MM (BLADE) ×1
BLADE SAW SGTL 83.5X18.5 (BLADE) ×3 IMPLANT
BLADE SURG 10 STRL SS (BLADE) ×3 IMPLANT
BNDG CMPR 9X6 STRL LF SNTH (GAUZE/BANDAGES/DRESSINGS) ×1
BNDG ESMARK 6X9 LF (GAUZE/BANDAGES/DRESSINGS) ×3
BOWL SMART MIX CTS (DISPOSABLE) ×3 IMPLANT
CAPT KNEE TOTAL 3 ×3 IMPLANT
CEMENT BONE SIMPLEX SPEEDSET (Cement) ×6 IMPLANT
CLOSURE WOUND 1/2 X4 (GAUZE/BANDAGES/DRESSINGS) ×1
COVER SURGICAL LIGHT HANDLE (MISCELLANEOUS) ×3 IMPLANT
CUFF TOURNIQUET SINGLE 34IN LL (TOURNIQUET CUFF) ×3 IMPLANT
DRAPE EXTREMITY T 121X128X90 (DRAPE) ×3 IMPLANT
DRAPE HALF SHEET 40X57 (DRAPES) ×3 IMPLANT
DRAPE INCISE IOBAN 66X45 STRL (DRAPES) ×6 IMPLANT
DRAPE U-SHAPE 47X51 STRL (DRAPES) ×3 IMPLANT
DRSG AQUACEL AG ADV 3.5X10 (GAUZE/BANDAGES/DRESSINGS) ×3 IMPLANT
DURAPREP 26ML APPLICATOR (WOUND CARE) ×6 IMPLANT
ELECT REM PT RETURN 9FT ADLT (ELECTROSURGICAL) ×3
ELECTRODE REM PT RTRN 9FT ADLT (ELECTROSURGICAL) ×1 IMPLANT
GLOVE BIOGEL M 7.0 STRL (GLOVE) IMPLANT
GLOVE BIOGEL PI IND STRL 7.5 (GLOVE) IMPLANT
GLOVE BIOGEL PI IND STRL 8.5 (GLOVE) ×1 IMPLANT
GLOVE BIOGEL PI INDICATOR 7.5 (GLOVE)
GLOVE BIOGEL PI INDICATOR 8.5 (GLOVE) ×2
GLOVE SURG ORTHO 8.0 STRL STRW (GLOVE) ×6 IMPLANT
GOWN STRL REUS W/ TWL LRG LVL3 (GOWN DISPOSABLE) ×1 IMPLANT
GOWN STRL REUS W/ TWL XL LVL3 (GOWN DISPOSABLE) ×2 IMPLANT
GOWN STRL REUS W/TWL 2XL LVL3 (GOWN DISPOSABLE) ×3 IMPLANT
GOWN STRL REUS W/TWL LRG LVL3 (GOWN DISPOSABLE) ×3
GOWN STRL REUS W/TWL XL LVL3 (GOWN DISPOSABLE) ×6
HANDPIECE INTERPULSE COAX TIP (DISPOSABLE) ×3
HOOD PEEL AWAY FACE SHEILD DIS (HOOD) ×9 IMPLANT
KIT BASIN OR (CUSTOM PROCEDURE TRAY) ×3 IMPLANT
KIT ROOM TURNOVER OR (KITS) ×3 IMPLANT
KNEE CAPITATED TOTAL 3 IMPLANT
MANIFOLD NEPTUNE II (INSTRUMENTS) ×3 IMPLANT
NDL 18GX1X1/2 (RX/OR ONLY) (NEEDLE) IMPLANT
NEEDLE 18GX1X1/2 (RX/OR ONLY) (NEEDLE) IMPLANT
NEEDLE 22X1 1/2 (OR ONLY) (NEEDLE) ×6 IMPLANT
NS IRRIG 1000ML POUR BTL (IV SOLUTION) ×3 IMPLANT
PACK TOTAL JOINT (CUSTOM PROCEDURE TRAY) ×3 IMPLANT
PAD ARMBOARD 7.5X6 YLW CONV (MISCELLANEOUS) ×6 IMPLANT
SET HNDPC FAN SPRY TIP SCT (DISPOSABLE) ×1 IMPLANT
STRIP CLOSURE SKIN 1/2X4 (GAUZE/BANDAGES/DRESSINGS) ×2 IMPLANT
SUCTION FRAZIER HANDLE 10FR (MISCELLANEOUS)
SUCTION TUBE FRAZIER 10FR DISP (MISCELLANEOUS) IMPLANT
SUT BONE WAX W31G (SUTURE) ×3 IMPLANT
SUT MNCRL AB 3-0 PS2 18 (SUTURE) ×3 IMPLANT
SUT VIC AB 0 CTB1 27 (SUTURE) ×3 IMPLANT
SUT VIC AB 1 CT1 27 (SUTURE) ×6
SUT VIC AB 1 CT1 27XBRD ANBCTR (SUTURE) ×2 IMPLANT
SUT VIC AB 2-0 CT1 27 (SUTURE) ×6
SUT VIC AB 2-0 CT1 TAPERPNT 27 (SUTURE) ×2 IMPLANT
SUT VLOC 180 0 24IN GS25 (SUTURE) ×3 IMPLANT
SYR 20CC LL (SYRINGE) ×6 IMPLANT
TOWEL OR 17X24 6PK STRL BLUE (TOWEL DISPOSABLE) ×3 IMPLANT
TOWEL OR 17X26 10 PK STRL BLUE (TOWEL DISPOSABLE) ×3 IMPLANT
TRAY FOLEY CATH SILVER 14FR (SET/KITS/TRAYS/PACK) ×2 IMPLANT
WRAP KNEE MAXI GEL POST OP (GAUZE/BANDAGES/DRESSINGS) ×3 IMPLANT

## 2017-12-25 NOTE — Anesthesia Postprocedure Evaluation (Signed)
Anesthesia Post Note  Patient: [place]  Procedure(s) Performed: TOTAL KNEE ARTHROPLASTY (Right )     Patient location during evaluation: PACU Anesthesia Type: MAC Level of consciousness: oriented and awake and alert Pain management: pain level controlled Vital Signs Assessment: post-procedure vital signs reviewed and stable Respiratory status: spontaneous breathing, respiratory function stable and patient connected to nasal cannula oxygen Cardiovascular status: blood pressure returned to baseline and stable Postop Assessment: no headache, no backache and no apparent nausea or vomiting Anesthetic complications: no    Last Vitals:  Vitals:   12/25/17 1215 12/25/17 1300  BP: (!) 117/58 140/71  Pulse: 62 70  Resp: (!) 26 20  Temp: 36.4 C (!) 36.3 C  SpO2: 100% 98%    Last Pain:  Vitals:   12/25/17 1300  TempSrc: Axillary  PainSc:                  Anira Senegal DAVID

## 2017-12-25 NOTE — Anesthesia Procedure Notes (Signed)
Spinal  Patient location during procedure: OR Start time: 12/25/2017 8:25 AM End time: 12/25/2017 8:30 AM Staffing Anesthesiologist: Lillia Abed, MD Performed: anesthesiologist  Preanesthetic Checklist Completed: patient identified, surgical consent, pre-op evaluation, timeout performed, IV checked, risks and benefits discussed and monitors and equipment checked Spinal Block Patient position: sitting Prep: DuraPrep Patient monitoring: heart rate, cardiac monitor, continuous pulse ox and blood pressure Approach: midline Location: L2-3 Injection technique: single-shot Needle Needle type: Pencan  Needle gauge: 24 G Needle length: 9 cm Needle insertion depth: 9 cm

## 2017-12-25 NOTE — Evaluation (Signed)
Physical Therapy Evaluation Patient Details Name: Tara [name] MRN: [id number] DOB: 1936-05-31 Today's Date: 12/25/2017   History of Present Illness  82 y.o. female admitted on 12/25/17 for elective R TKA.  Pt with significant PMH of L TKA,   Clinical Impression  Pt is POD #0 and is moving well.  She is min assist overall and was able to walk into the hallway and initiate HEP review.  She lives in an independent living apartment and does not have STE her home, so she should do well.  She has arranged 24/7 family assist at discharge.   PT to follow acutely for deficits listed below.       Follow Up Recommendations DC plan and follow up therapy as arranged by surgeon;Supervision for mobility/OOB    Equipment Recommendations  None recommended by PT    Recommendations for Other Services   NA    Precautions / Restrictions Precautions Precautions: Knee Precaution Booklet Issued: Yes (comment) Precaution Comments: knee exercise handout given Restrictions Weight Bearing Restrictions: Yes RLE Weight Bearing: Weight bearing as tolerated      Mobility  Bed Mobility Overal bed mobility: Needs Assistance Bed Mobility: Supine to Sit     Supine to sit: HOB elevated;Min assist     General bed mobility comments: min assist to help progress her right leg over the side of the bed.   Transfers Overall transfer level: Needs assistance Equipment used: Rolling walker (2 wheeled) Transfers: Sit to/from Stand Sit to Stand: Min assist         General transfer comment: Min assist to help pt power up to stand from low bed.  She correctly pushed off of the bed instead of pulling on the RW.   Ambulation/Gait Ambulation/Gait assistance: Min guard Ambulation Distance (Feet): 65 Feet Assistive device: Rolling walker (2 wheeled) Gait Pattern/deviations: Step-to pattern;Antalgic Gait velocity: decreased Gait velocity interpretation: Below normal speed for age/gender General Gait Details:  Verbal cues for safe RW use and LE sequencing.  Pt needed cues to stay closer to RW as she let her feet trail behind and did not keep them inside while turning.  no R knee instability noted with gait and WBAT reviewed.          Balance Overall balance assessment: Needs assistance Sitting-balance support: Feet supported;No upper extremity supported Sitting balance-Leahy Scale: Good     Standing balance support: Bilateral upper extremity supported Standing balance-Leahy Scale: Poor                               Pertinent Vitals/Pain Pain Assessment: 0-10 Pain Score: 3  Pain Location: right knee Pain Descriptors / Indicators: Aching;Burning Pain Intervention(s): Limited activity within patient's tolerance;Monitored during session;Repositioned;Ice applied    Home Living Family/patient expects to be discharged to:: Private residence Living Arrangements: Alone Available Help at Discharge: Family;Available 24 hours/day Type of Home: Independent living facility Home Access: Level entry     Home Layout: One level Home Equipment: Cane - quad;Grab bars - toilet;Shower seat - built in;Walker - Psychologist, educational - 2 wheels;Bedside commode Additional Comments: also had CPM delivered     Prior Function Level of Independence: Independent with assistive device(s)         Comments: used SPC PTA     Hand Dominance   Dominant Hand: Right    Extremity/Trunk Assessment   Upper Extremity Assessment Upper Extremity Assessment: Defer to OT evaluation    Lower Extremity Assessment Lower  Extremity Assessment: RLE deficits/detail RLE Deficits / Details: right leg with normal post op pain and weakness, ankle 3/5, knee 2+/5 hip 2+/5 RLE Sensation: (intact to LT)    Cervical / Trunk Assessment Cervical / Trunk Assessment: Other exceptions Cervical / Trunk Exceptions: h/o low back surgery  Communication   Communication: No difficulties  Cognition Arousal/Alertness:  Awake/alert Behavior During Therapy: WFL for tasks assessed/performed Overall Cognitive Status: Within Functional Limits for tasks assessed                                           Exercises Total Joint Exercises Ankle Circles/Pumps: AROM;Both;20 reps Quad Sets: AROM;Right;10 reps Towel Squeeze: AROM;Both;10 reps Heel Slides: AAROM;Right;10 reps   Assessment/Plan    PT Assessment Patient needs continued PT services  PT Problem List Decreased strength;Decreased range of motion;Decreased activity tolerance;Decreased balance;Decreased mobility;Decreased knowledge of use of DME;Decreased knowledge of precautions;Pain       PT Treatment Interventions DME instruction;Gait training;Functional mobility training;Therapeutic activities;Therapeutic exercise;Balance training;Patient/family education;Manual techniques;Modalities    PT Goals (Current goals can be found in the Care Plan section)  Acute Rehab PT Goals Patient Stated Goal: to go home tomorrow if she can PT Goal Formulation: With patient Time For Goal Achievement: 01/01/18 Potential to Achieve Goals: Good    Frequency 7X/week           AM-PAC PT "6 Clicks" Daily Activity  Outcome Measure Difficulty turning over in bed (including adjusting bedclothes, sheets and blankets)?: Unable Difficulty moving from lying on back to sitting on the side of the bed? : Unable Difficulty sitting down on and standing up from a chair with arms (e.g., wheelchair, bedside commode, etc,.)?: Unable Help needed moving to and from a bed to chair (including a wheelchair)?: A Little Help needed walking in hospital room?: A Little Help needed climbing 3-5 steps with a railing? : A Little 6 Click Score: 12    End of Session   Activity Tolerance: Patient tolerated treatment well Patient left: in chair;with call bell/phone within reach;with family/visitor present Nurse Communication: Mobility status PT Visit Diagnosis: Muscle  weakness (generalized) (M62.81);Difficulty in walking, not elsewhere classified (R26.2);Pain Pain - Right/Left: Right Pain - part of body: Knee    Time: 4268-3419 PT Time Calculation (min) (ACUTE ONLY): 35 min   Charges:          Wells Guiles B. Kastin Cerda, PT, DPT [phone]   PT Evaluation $PT Eval Moderate Complexity: 1 Mod PT Treatments $Gait Training: 8-22 mins   12/25/2017, 3:26 PM

## 2017-12-25 NOTE — Transfer of Care (Signed)
Immediate Anesthesia Transfer of Care Note  Patient: Tara [name]  Procedure(s) Performed: TOTAL KNEE ARTHROPLASTY (Right )  Patient Location: PACU  Anesthesia Type:MAC and Spinal  Level of Consciousness: awake, alert , oriented, patient cooperative and responds to stimulation  Airway & Oxygen Therapy: Patient Spontanous Breathing  Post-op Assessment: Report given to RN and Post -op Vital signs reviewed and stable  Post vital signs: Reviewed and stable  Last Vitals:  Vitals:   12/25/17 0805 12/25/17 0810  BP: (!) 157/45 (!) 144/51  Pulse: 68 60  Resp: 12 (!) 9  Temp:    SpO2: 97% 98%    Last Pain:  Vitals:   12/25/17 0709  TempSrc:   PainSc: 5          Complications: No apparent anesthesia complications

## 2017-12-25 NOTE — Anesthesia Procedure Notes (Signed)
Procedure Name: MAC Date/Time: 12/25/2017 8:24 AM Performed by: Glynda Jaeger, CRNA Pre-anesthesia Checklist: Patient identified, Emergency Drugs available, Suction available, Timeout performed and Patient being monitored Patient Re-evaluated:Patient Re-evaluated prior to induction Oxygen Delivery Method: Simple face mask Placement Confirmation: positive ETCO2

## 2017-12-25 NOTE — H&P (Signed)
Tara [name] MRN:  [id number] DOB/SEX:  1936-06-08/female  CHIEF COMPLAINT:  Painful right Knee  HISTORY: Patient is a 82 y.o. female presented with a history of pain in the right knee. Onset of symptoms was gradual starting a few years ago with gradually worsening course since that time. Patient has been treated conservatively with over-the-counter NSAIDs and activity modification. Patient currently rates pain in the knee at 10 out of 10 with activity. There is pain at night.  PAST MEDICAL HISTORY: Patient Active Problem List   Diagnosis Date Noted  . Ductal carcinoma in situ of right breast 08/17/2015  . Breast cancer of upper-outer quadrant of right female breast ([place]) 08/14/2015  . Right hip pain 10/27/2012  . Low back pain 10/27/2012  . Hypertension   . Osteoarthritis of right knee 11/30/2011  . Baker's cyst of knee 11/30/2011  . Dependent edema 06/19/2011  . Prediabetes 06/19/2011  . Urinary tract infection, recurrent 06/19/2011  . Urinary incontinence 06/19/2011  . Benign positional vertigo 06/19/2011  . Hypothyroidism 06/19/2011  . Tubular adenoma of colon 06/19/2011  . Essential hypertension, benign 04/15/2011   Past Medical History:  Diagnosis Date  . Arthritis    knees  . Breast cancer ([place]) 09/30/15   right breast  . Dependent edema   . Depression    just lost spouse in Nov. 2018  . Essential hypertension, benign 04/15/2011  . Hypertension   . Hypothyroidism   . Obesity   . Pre-diabetes   . Radicular pain   . Thyroid disease    hypothyroidism   Past Surgical History:  Procedure Laterality Date  . ANKLE SURGERY  1994   RIGHT  . BACK SURGERY    . BREAST LUMPECTOMY WITH NEEDLE LOCALIZATION Right 09/30/2015   Procedure: RIGHT BREAST LUMPECTOMY WITH NEEDLE LOCALIZATION;  Surgeon: Fanny Skates, MD;  Location: [facility];  Service: General;  Laterality: Right;  . BREAST SURGERY    . EYE SURGERY     bilateral  . HAND SURGERY  1951   RIGHT  HAND  . JOINT REPLACEMENT     rt shoulder rotator cuff  . KNEE ARTHROSCOPY     left knee  . LUMBAR LAMINECTOMY/DECOMPRESSION MICRODISCECTOMY Right 01/11/2013   Procedure: LUMBAR LAMINECTOMY/DECOMPRESSION MICRODISCECTOMY 1 LEVEL,Right Lumbar Three-Four;  Surgeon: Elaina Hoops, MD;  Location: [facility];  Service: Neurosurgery;  Laterality: Right;  . ROTATOR CUFF REPAIR  2011   RIGHT  . TONSILLECTOMY    . TOTAL KNEE ARTHROPLASTY     Left     MEDICATIONS:   Medications Prior to Admission  Medication Sig Dispense Refill Last Dose  . acetaminophen (TYLENOL) 500 MG tablet Take 1,000 mg by mouth every 8 (eight) hours as needed for mild pain or headache.     Marland Kitchen amLODipine (NORVASC) 5 MG tablet TAKE 1 TABLET EVERY DAY 90 tablet 3   . aspirin 81 MG EC tablet Take 81 mg by mouth daily.     Taking  . Cholecalciferol 2000 units CAPS Take 2,000 Units by mouth daily.     . diazepam (VALIUM) 5 MG tablet 1 tablet three times daily as needed for anxiety (Patient taking differently: Take 5 mg by mouth daily as needed for anxiety (usually at bedtime). ) 30 tablet 0 Taking  . losartan (COZAAR) 100 MG tablet TAKE 1 TABLET EVERY DAY 90 tablet 3 Taking  . potassium chloride SA (K-DUR,KLOR-CON) 20 MEQ tablet Take 1 tablet (20 mEq total) by mouth daily. (Patient taking  differently: Take 20 mEq by mouth 2 (two) times daily as needed (only takes when takes Torsemide). ) 180 tablet 3   . Probiotic Product (PROBIOTIC DAILY PO) Take 1 tablet by mouth daily.    Taking  . torsemide (DEMADEX) 20 MG tablet Take 2 tablets (40 mg total) by mouth every morning. (Patient taking differently: Take 40 mg by mouth daily as needed. ) 180 tablet 3   . D-MANNOSE PO Take 15 mLs by mouth daily. Mixed with water daily   Taking  . levothyroxine (SYNTHROID, LEVOTHROID) 75 MCG tablet TAKE 1 TABLET EVERY DAY 90 tablet 3   . nystatin cream (MYCOSTATIN) Apply twice daily externally as needed (Patient not taking: Reported on 12/06/2017) 30 g 0  Not Taking at Unknown time  . sulfamethoxazole-trimethoprim (BACTRIM DS) 800-160 MG tablet Take 1 tablet by mouth 2 (two) times daily. (Patient not taking: Reported on 12/06/2017) 6 tablet 0 Not Taking at Unknown time  . traMADol (ULTRAM) 50 MG tablet Take 1 tablet (50 mg total) by mouth every 8 (eight) hours as needed. (Patient not taking: Reported on 12/06/2017) 30 tablet 0 Not Taking at Unknown time    ALLERGIES:   Allergies  Allergen Reactions  . Ceclor [Cefaclor] Hives and Itching    REVIEW OF SYSTEMS:  A comprehensive review of systems was negative except for: Musculoskeletal: positive for arthralgias and bone pain   FAMILY HISTORY:   Family History  Problem Relation Age of Onset  . Breast cancer Sister   . Cancer Brother        Kidney  . Heart failure Father   . Uterine cancer Sister   . Breast cancer Sister        Uterine  . Cancer Brother        Lung    SOCIAL HISTORY:   Social History   Tobacco Use  . Smoking status: Never Smoker  . Smokeless tobacco: Never Used  Substance Use Topics  . Alcohol use: No     EXAMINATION:  Vital signs in last 24 hours:    There were no vitals taken for this visit.  General Appearance:    Alert, cooperative, no distress, appears stated age  Head:    Normocephalic, without obvious abnormality, atraumatic  Eyes:    PERRL, conjunctiva/corneas clear, EOM's intact, fundi    benign, both eyes  Ears:    Normal TM's and external ear canals, both ears  Nose:   Nares normal, septum midline, mucosa normal, no drainage    or sinus tenderness  Throat:   Lips, mucosa, and tongue normal; teeth and gums normal  Neck:   Supple, symmetrical, trachea midline, no adenopathy;    thyroid:  no enlargement/tenderness/nodules; no carotid   bruit or JVD  Back:     Symmetric, no curvature, ROM normal, no CVA tenderness  Lungs:     Clear to auscultation bilaterally, respirations unlabored  Chest Wall:    No tenderness or deformity   Heart:     Regular rate and rhythm, S1 and S2 normal, no murmur, rub   or gallop  Breast Exam:    No tenderness, masses, or nipple abnormality  Abdomen:     Soft, non-tender, bowel sounds active all four quadrants,    no masses, no organomegaly  Genitalia:    Normal female without lesion, discharge or tenderness  Rectal:    Normal tone, no masses or tenderness;   guaiac negative stool  Extremities:   Extremities normal, atraumatic, no cyanosis  or edema  Pulses:   2+ and symmetric all extremities  Skin:   Skin color, texture, turgor normal, no rashes or lesions  Lymph nodes:   Cervical, supraclavicular, and axillary nodes normal  Neurologic:   CNII-XII intact, normal strength, sensation and reflexes    throughout    Musculoskeletal:  ROM 0-120, Ligaments intact,  Imaging Review Plain radiographs demonstrate severe degenerative joint disease of the right knee. The overall alignment is neutral. The bone quality appears to be good for age and reported activity level.  Assessment/Plan: Primary osteoarthritis, right knee   The patient history, physical examination and imaging studies are consistent with advanced degenerative joint disease of the right knee. The patient has failed conservative treatment.  The clearance notes were reviewed.  After discussion with the patient it was felt that Total Knee Replacement was indicated. The procedure,  risks, and benefits of total knee arthroplasty were presented and reviewed. The risks including but not limited to aseptic loosening, infection, blood clots, vascular injury, stiffness, patella tracking problems complications among others were discussed. The patient acknowledged the explanation, agreed to proceed with the plan.  Donia Ast 12/25/2017, 6:15 AM

## 2017-12-25 NOTE — Progress Notes (Signed)
Orthopedic Tech Progress Note Patient Details:  Tara [name] 02-Apr-1936 [id number]      Post Interventions Patient Tolerated: Well Instructions Provided: Care of device   [place] Pink 12/25/2017, 11:08 AM

## 2017-12-25 NOTE — Anesthesia Preprocedure Evaluation (Addendum)
Anesthesia Evaluation  Patient identified by MRN, date of birth, ID band Patient awake    Reviewed: Allergy & Precautions, NPO status , Patient's Chart, lab work & pertinent test results  Airway Mallampati: I  TM Distance: >3 FB Neck ROM: Full    Dental  (+) Dental Advisory Given, Teeth Intact   Pulmonary    Pulmonary exam normal        Cardiovascular hypertension, Pt. on medications Normal cardiovascular exam     Neuro/Psych Depression    GI/Hepatic   Endo/Other    Renal/GU      Musculoskeletal   Abdominal   Peds  Hematology   Anesthesia Other Findings   Reproductive/Obstetrics                            Anesthesia Physical Anesthesia Plan  ASA: II  Anesthesia Plan: Spinal   Post-op Pain Management:  Regional for Post-op pain   Induction:   PONV Risk Score and Plan: 2 and Ondansetron  Airway Management Planned: Simple Face Mask  Additional Equipment:   Intra-op Plan:   Post-operative Plan:   Informed Consent: I have reviewed the patients History and Physical, chart, labs and discussed the procedure including the risks, benefits and alternatives for the proposed anesthesia with the patient or authorized representative who has indicated his/her understanding and acceptance.     Plan Discussed with: CRNA and Surgeon  Anesthesia Plan Comments:         Anesthesia Quick Evaluation

## 2017-12-26 ENCOUNTER — Encounter (HOSPITAL_COMMUNITY): Payer: Self-pay | Admitting: Orthopedic Surgery

## 2017-12-26 DIAGNOSIS — E039 Hypothyroidism, unspecified: Secondary | ICD-10-CM | POA: Diagnosis not present

## 2017-12-26 DIAGNOSIS — Z853 Personal history of malignant neoplasm of breast: Secondary | ICD-10-CM | POA: Diagnosis not present

## 2017-12-26 DIAGNOSIS — R7303 Prediabetes: Secondary | ICD-10-CM | POA: Diagnosis not present

## 2017-12-26 DIAGNOSIS — F329 Major depressive disorder, single episode, unspecified: Secondary | ICD-10-CM | POA: Diagnosis not present

## 2017-12-26 DIAGNOSIS — M1711 Unilateral primary osteoarthritis, right knee: Secondary | ICD-10-CM | POA: Diagnosis not present

## 2017-12-26 DIAGNOSIS — I10 Essential (primary) hypertension: Secondary | ICD-10-CM | POA: Diagnosis not present

## 2017-12-26 LAB — BASIC METABOLIC PANEL
Anion gap: 15 (ref 5–15)
BUN: 12 mg/dL (ref 6–20)
CO2: 24 mmol/L (ref 22–32)
Calcium: 8.8 mg/dL — ABNORMAL LOW (ref 8.9–10.3)
Chloride: 99 mmol/L — ABNORMAL LOW (ref 101–111)
Creatinine, Ser: 1.06 mg/dL — ABNORMAL HIGH (ref 0.44–1.00)
GFR calc Af Amer: 56 mL/min — ABNORMAL LOW (ref >60)
GFR calc non Af Amer: 49 mL/min — ABNORMAL LOW (ref >60)
Glucose, Bld: 128 mg/dL — ABNORMAL HIGH (ref 65–99)
Potassium: 3.6 mmol/L (ref 3.5–5.1)
Sodium: 138 mmol/L (ref 135–145)

## 2017-12-26 LAB — CBC
HCT: 37.3 % (ref 36.0–46.0)
Hemoglobin: 12.4 g/dL (ref 12.0–15.0)
MCH: 31.3 pg (ref 26.0–34.0)
MCHC: 33.2 g/dL (ref 30.0–36.0)
MCV: 94.2 fL (ref 78.0–100.0)
Platelets: 280 10*3/uL (ref 150–400)
RBC: 3.96 MIL/uL (ref 3.87–5.11)
RDW: 12.9 % (ref 11.5–15.5)
WBC: 14.9 10*3/uL — ABNORMAL HIGH (ref 4.0–10.5)

## 2017-12-26 MED ORDER — METHOCARBAMOL 500 MG PO TABS: 500.0000 mg | ORAL_TABLET | Freq: Four times a day (QID) | ORAL | 0 refills | Status: DC | PRN

## 2017-12-26 MED ORDER — ASPIRIN 325 MG PO TBEC: 325.0000 mg | DELAYED_RELEASE_TABLET | Freq: Two times a day (BID) | ORAL | 0 refills | Status: DC

## 2017-12-26 MED ORDER — HYDROCODONE-ACETAMINOPHEN 7.5-325 MG PO TABS: 1.0000 | ORAL_TABLET | Freq: Four times a day (QID) | ORAL | 0 refills | Status: DC

## 2017-12-26 NOTE — Care Management Note (Signed)
Case Management Note  Patient Details  Name: Tara [name] MRN: [id number] Date of Birth: 11-Nov-1936  Subjective/Objective: 82 yr old female s/p right total knee arthroplasty.                   Action/Plan: Patient was preoperatively setup with [facility], no changes. She will have family support at discharge.    Expected Discharge Date:  12/26/17               Expected Discharge Plan:  [place]  In-House Referral:  NA  Discharge planning Services  CM Consult  Post Acute Care Choice:  Durable Medical Equipment, Home Health Choice offered to:  Patient  DME Arranged:  3-N-1, Walker rolling, CPM DME Agency:  [employer]  HH Arranged:  PT [place]:  [facility] ([facility] [employer]  Status of Service:  Completed, signed off  If discussed at [employer] [facility] [employer], dates discussed:    Additional Comments:  Ninfa Meeker, RN 12/26/2017, 12:42 PM

## 2017-12-26 NOTE — Discharge Summary (Signed)
[place]   Lara Mulch, MD   Carlyon Shadow, PA-C [place], [place], [place]  [address]                             [phone]  PATIENT ID: Tara [name]        MRN:  [id number]          DOB/AGE: September 28, 1936 / 82 y.o.    DISCHARGE SUMMARY  ADMISSION DATE:    12/25/2017 DISCHARGE DATE:   12/26/2017   ADMISSION DIAGNOSIS: primary osteoarthritis right knee    DISCHARGE DIAGNOSIS:  primary osteoarthritis right knee    ADDITIONAL DIAGNOSIS: Active Problems:   S/P total knee replacement  Past Medical History:  Diagnosis Date  . Arthritis    knees  . Breast cancer ([place]) 09/30/15   right breast  . Dependent edema   . Depression    just lost spouse in Nov. 2018  . Essential hypertension, benign 04/15/2011  . Hypertension   . Hypothyroidism   . Obesity   . Pre-diabetes   . Radicular pain   . Thyroid disease    hypothyroidism    PROCEDURE: Procedure(s): TOTAL KNEE ARTHROPLASTY on 12/25/2017  CONSULTS:    HISTORY:  See H&P in chart  HOSPITAL COURSE:  Tara [name] is a 82 y.o. admitted on 12/25/2017 and found to have a diagnosis of primary osteoarthritis right knee.  After appropriate laboratory studies were obtained  they were taken to the operating room on 12/25/2017 and underwent Procedure(s): TOTAL KNEE ARTHROPLASTY.   They were given perioperative antibiotics:  Anti-infectives (From admission, onward)   Start     Dose/Rate Route Frequency Ordered Stop   12/25/17 1430  clindamycin (CLEOCIN) IVPB 600 mg     600 mg 100 mL/hr over 30 Minutes Intravenous Every 6 hours 12/25/17 1229 12/25/17 2158   12/25/17 0700  clindamycin (CLEOCIN) IVPB 900 mg     900 mg 100 mL/hr over 30 Minutes Intravenous To ShortStay Surgical 12/22/17 1224 12/25/17 0841    .  Patient given tranexamic acid IV or topical and exparel intra-operatively.  Tolerated the procedure well.    POD# 1: Vital signs were stable.  Patient denied Chest pain, shortness of  breath, or calf pain.  Patient was started on Lovenox 30 mg subcutaneously twice daily at 8am.  Consults to PT, OT, and care management were made.  The patient was weight bearing as tolerated.  CPM was placed on the operative leg 0-90 degrees for 6-8 hours a day. When out of the CPM, patient was placed in the foam block to achieve full extension. Incentive spirometry was taught.  Dressing was changed.       POD #2, Continued  PT for ambulation and exercise program.  IV saline locked.  O2 discontinued.    The remainder of the hospital course was dedicated to ambulation and strengthening.   The patient was discharged on 1 Day Post-Op in  Good condition.  Blood products given:none  DIAGNOSTIC STUDIES: Recent vital signs:  Patient Vitals for the past 24 hrs:  BP Temp Temp src Pulse Resp SpO2  12/26/17 0645 (!) 138/46 98.2 F (36.8 C) Oral 74 18 97 %  12/26/17 0004 (!) 121/49 (!) 97.4 F (36.3 C) Oral 64 18 97 %  12/25/17 2022 (!) 125/54 98.2 F (36.8 C) Oral 65 19 97 %  12/25/17 1300 140/71 (!) 97.3 F (36.3 C) Axillary  70 20 98 %       Recent laboratory studies: Recent Labs    12/26/17 0600  WBC 14.9*  HGB 12.4  HCT 37.3  PLT 280   Recent Labs    12/26/17 0600  NA 138  K 3.6  CL 99*  CO2 24  BUN 12  CREATININE 1.06*  GLUCOSE 128*  CALCIUM 8.8*   Lab Results  Component Value Date   INR 1.00 06/09/2011     Recent Radiographic Studies :  No results found.  DISCHARGE INSTRUCTIONS: Discharge Instructions    CPM   Complete by:  As directed    Continuous passive motion machine (CPM):      Use the CPM from 0 to 90 for 4-6 hours per day.      You may increase by 10 per day.  You may break it up into 2 or 3 sessions per day.      Use CPM for 2 weeks or until you are told to stop.   Call MD / Call 911   Complete by:  As directed    If you experience chest pain or shortness of breath, CALL 911 and be transported to the hospital emergency room.  If you develope a fever  above 101 F, pus (white drainage) or increased drainage or redness at the wound, or calf pain, call your surgeon's office.   Constipation Prevention   Complete by:  As directed    Drink plenty of fluids.  Prune juice may be helpful.  You may use a stool softener, such as Colace (over the counter) 100 mg twice a day.  Use MiraLax (over the counter) for constipation as needed.   Diet - low sodium heart healthy   Complete by:  As directed    Discharge instructions   Complete by:  As directed    INSTRUCTIONS AFTER JOINT REPLACEMENT   Remove items at home which could result in a fall. This includes throw rugs or furniture in walking pathways ICE to the affected joint every three hours while awake for 30 minutes at a time, for at least the first 3-5 days, and then as needed for pain and swelling.  Continue to use ice for pain and swelling. You may notice swelling that will progress down to the foot and ankle.  This is normal after surgery.  Elevate your leg when you are not up walking on it.   Continue to use the breathing machine you got in the hospital (incentive spirometer) which will help keep your temperature down.  It is common for your temperature to cycle up and down following surgery, especially at night when you are not up moving around and exerting yourself.  The breathing machine keeps your lungs expanded and your temperature down.   DIET:  As you were doing prior to hospitalization, we recommend a well-balanced diet.  DRESSING / WOUND CARE / SHOWERING  Keep the surgical dressing until follow up.  The dressing is water proof, so you can shower without any extra covering.  IF THE DRESSING FALLS OFF or the wound gets wet inside, change the dressing with sterile gauze.  Please use good hand washing techniques before changing the dressing.  Do not use any lotions or creams on the incision until instructed by your surgeon.    ACTIVITY  Increase activity slowly as tolerated, but follow the  weight bearing instructions below.   No driving for 6 weeks or until further direction given by your physician.  You cannot drive while taking narcotics.  No lifting or carrying greater than 10 lbs. until further directed by your surgeon. Avoid periods of inactivity such as sitting longer than an hour when not asleep. This helps prevent blood clots.  You may return to work once you are authorized by your doctor.     WEIGHT BEARING   Weight bearing as tolerated with assist device (walker, cane, etc) as directed, use it as long as suggested by your surgeon or therapist, typically at least 4-6 weeks.   EXERCISES  Results after joint replacement surgery are often greatly improved when you follow the exercise, range of motion and muscle strengthening exercises prescribed by your doctor. Safety measures are also important to protect the joint from further injury. Any time any of these exercises cause you to have increased pain or swelling, decrease what you are doing until you are comfortable again and then slowly increase them. If you have problems or questions, call your caregiver or physical therapist for advice.   Rehabilitation is important following a joint replacement. After just a few days of immobilization, the muscles of the leg can become weakened and shrink (atrophy).  These exercises are designed to build up the tone and strength of the thigh and leg muscles and to improve motion. Often times heat used for twenty to thirty minutes before working out will loosen up your tissues and help with improving the range of motion but do not use heat for the first two weeks following surgery (sometimes heat can increase post-operative swelling).   These exercises can be done on a training (exercise) mat, on the floor, on a table or on a bed. Use whatever works the best and is most comfortable for you.    Use music or television while you are exercising so that the exercises are a pleasant break in  your day. This will make your life better with the exercises acting as a break in your routine that you can look forward to.   Perform all exercises about fifteen times, three times per day or as directed.  You should exercise both the operative leg and the other leg as well.   Exercises include:   Quad Sets - Tighten up the muscle on the front of the thigh (Quad) and hold for 5-10 seconds.   Straight Leg Raises - With your knee straight (if you were given a brace, keep it on), lift the leg to 60 degrees, hold for 3 seconds, and slowly lower the leg.  Perform this exercise against resistance later as your leg gets stronger.  Leg Slides: Lying on your back, slowly slide your foot toward your buttocks, bending your knee up off the floor (only go as far as is comfortable). Then slowly slide your foot back down until your leg is flat on the floor again.  Angel Wings: Lying on your back spread your legs to the side as far apart as you can without causing discomfort.  Hamstring Strength:  Lying on your back, push your heel against the floor with your leg straight by tightening up the muscles of your buttocks.  Repeat, but this time bend your knee to a comfortable angle, and push your heel against the floor.  You may put a pillow under the heel to make it more comfortable if necessary.   A rehabilitation program following joint replacement surgery can speed recovery and prevent re-injury in the future due to weakened muscles. Contact your doctor or a physical therapist for  more information on knee rehabilitation.    CONSTIPATION  Constipation is defined medically as fewer than three stools per week and severe constipation as less than one stool per week.  Even if you have a regular bowel pattern at home, your normal regimen is likely to be disrupted due to multiple reasons following surgery.  Combination of anesthesia, postoperative narcotics, change in appetite and fluid intake all can affect your bowels.    YOU MUST use at least one of the following options; they are listed in order of increasing strength to get the job done.  They are all available over the counter, and you may need to use some, POSSIBLY even all of these options:    Drink plenty of fluids (prune juice may be helpful) and high fiber foods Colace 100 mg by mouth twice a day  Senokot for constipation as directed and as needed Dulcolax (bisacodyl), take with full glass of water  Miralax (polyethylene glycol) once or twice a day as needed.  If you have tried all these things and are unable to have a bowel movement in the first 3-4 days after surgery call either your surgeon or your primary doctor.    If you experience loose stools or diarrhea, hold the medications until you stool forms back up.  If your symptoms do not get better within 1 week or if they get worse, check with your doctor.  If you experience "the worst abdominal pain ever" or develop nausea or vomiting, please contact the office immediately for further recommendations for treatment.   ITCHING:  If you experience itching with your medications, try taking only a single pain pill, or even half a pain pill at a time.  You can also use Benadryl over the counter for itching or also to help with sleep.   TED HOSE STOCKINGS:  Use stockings on both legs until for at least 2 weeks or as directed by physician office. They may be removed at night for sleeping.  MEDICATIONS:  See your medication summary on the "After Visit Summary" that nursing will review with you.  You may have some home medications which will be placed on hold until you complete the course of blood thinner medication.  It is important for you to complete the blood thinner medication as prescribed.  PRECAUTIONS:  If you experience chest pain or shortness of breath - call 911 immediately for transfer to the hospital emergency department.   If you develop a fever greater that 101 F, purulent drainage from wound,  increased redness or drainage from wound, foul odor from the wound/dressing, or calf pain - CONTACT YOUR SURGEON.                                                   FOLLOW-UP APPOINTMENTS:  If you do not already have a post-op appointment, please call the office for an appointment to be seen by your surgeon.  Guidelines for how soon to be seen are listed in your "After Visit Summary", but are typically between 1-4 weeks after surgery.  OTHER INSTRUCTIONS:   Knee Replacement:  Do not place pillow under knee, focus on keeping the knee straight while resting. CPM instructions: 0-90 degrees, 2 hours in the morning, 2 hours in the afternoon, and 2 hours in the evening. Place foam block, curve side up under heel  at all times except when in CPM or when walking.  DO NOT modify, tear, cut, or change the foam block in any way.  MAKE SURE YOU:  Understand these instructions.  Get help right away if you are not doing well or get worse.    Thank you for letting us be a part of your medical care team.  It is a privilege we respect greatly.  We hope these instructions will help you stay on track for a fast and full recovery!   Increase activity slowly as tolerated   Complete by:  As directed       DISCHARGE MEDICATIONS:   Allergies as of 12/26/2017      Reactions   Ceclor [cefaclor] Hives, Itching      Medication List    STOP taking these medications   sulfamethoxazole-trimethoprim 800-160 MG tablet Commonly known as:  BACTRIM DS     TAKE these medications   acetaminophen 500 MG tablet Commonly known as:  TYLENOL Take 1,000 mg by mouth every 8 (eight) hours as needed for mild pain or headache.   amLODipine 5 MG tablet Commonly known as:  NORVASC TAKE 1 TABLET EVERY DAY   aspirin 325 MG EC tablet Take 1 tablet (325 mg total) by mouth 2 (two) times daily. What changed:    medication strength  how much to take  when to take this   Cholecalciferol 2000 units Caps Take 2,000 Units by  mouth daily.   D-MANNOSE PO Take 15 mLs by mouth daily. Mixed with water daily   diazepam 5 MG tablet Commonly known as:  VALIUM 1 tablet three times daily as needed for anxiety What changed:    how much to take  how to take this  when to take this  reasons to take this  additional instructions   HYDROcodone-acetaminophen 7.5-325 MG tablet Commonly known as:  NORCO Take 1 tablet by mouth every 6 (six) hours.   levothyroxine 75 MCG tablet Commonly known as:  SYNTHROID, LEVOTHROID TAKE 1 TABLET EVERY DAY   losartan 100 MG tablet Commonly known as:  COZAAR TAKE 1 TABLET EVERY DAY   methocarbamol 500 MG tablet Commonly known as:  ROBAXIN Take 1-2 tablets (500-1,000 mg total) by mouth every 6 (six) hours as needed for muscle spasms.   nystatin cream Commonly known as:  MYCOSTATIN Apply twice daily externally as needed   potassium chloride SA 20 MEQ tablet Commonly known as:  K-DUR,KLOR-CON Take 1 tablet (20 mEq total) by mouth daily. What changed:    when to take this  reasons to take this   PROBIOTIC DAILY PO Take 1 tablet by mouth daily.   torsemide 20 MG tablet Commonly known as:  DEMADEX Take 2 tablets (40 mg total) by mouth every morning. What changed:    when to take this  reasons to take this   traMADol 50 MG tablet Commonly known as:  ULTRAM Take 1 tablet (50 mg total) by mouth every 8 (eight) hours as needed.            Durable Medical Equipment  (From admission, onward)        Start     Ordered   12/25/17 1230  DME Walker rolling  Once    Question:  Patient needs a walker to treat with the following condition  Answer:  S/P total knee replacement   12/25/17 1229   12/25/17 1230  DME 3 n 1  Once     12/25/17 1229  12/25/17 1230  DME Bedside commode  Once    Question:  Patient needs a bedside commode to treat with the following condition  Answer:  S/P total knee replacement   12/25/17 1229      FOLLOW UP VISIT:     DISPOSITION: HOME VS. SNF  CONDITION:  Good   Donia Ast 12/26/2017, 12:29 PM

## 2017-12-26 NOTE — Op Note (Signed)
TOTAL KNEE REPLACEMENT OPERATIVE NOTE:  12/25/2017  2:56 PM  PATIENT:  Tara [name]  82 y.o. female  PRE-OPERATIVE DIAGNOSIS:  primary osteoarthritis right knee  POST-OPERATIVE DIAGNOSIS:  primary osteoarthritis right knee  PROCEDURE:  Procedure(s): TOTAL KNEE ARTHROPLASTY  SURGEON:  Surgeon(s): Vickey Huger, MD  PHYSICIAN ASSISTANT: Nehemiah Massed, [facility]  ANESTHESIA:   spinal  DRAINS: Hemovac  SPECIMEN: None  COUNTS:  Correct  TOURNIQUET:   Total Tourniquet Time Documented: Thigh (Right) - 39 minutes Total: Thigh (Right) - 39 minutes   DICTATION:  Indication for procedure:    The patient is a 82 y.o. female who has failed conservative treatment for primary osteoarthritis right knee.  Informed consent was obtained prior to anesthesia. The risks versus benefits of the operation were explain and in a way the patient can, and did, understand.   On the implant demand matching protocol, this patient scored 9.  Therefore, this patient was not receive a polyethylene insert with vitamin E which is a high demand implant.  Description of procedure:     The patient was taken to the operating room and placed under anesthesia.  The patient was positioned in the usual fashion taking care that all body parts were adequately padded and/or protected.  I foley catheter was not placed.  A tourniquet was applied and the leg prepped and draped in the usual sterile fashion.  The extremity was exsanguinated with the esmarch and tourniquet inflated to 350 mmHg.  Pre-operative range of motion was normal.  The knee was in 5 degree of mild varus.  A midline incision approximately 6-7 inches long was made with a #10 blade.  A new blade was used to make a parapatellar arthrotomy going 2-3 cm into the quadriceps tendon, over the patella, and alongside the medial aspect of the patellar tendon.  A synovectomy was then performed with the #10 blade and forceps. I then elevated the deep MCL off the medial  tibial metaphysis subperiosteally around to the semimembranosus attachment.    I everted the patella and used calipers to measure patellar thickness.  I used the reamer to ream down to appropriate thickness to recreate the native thickness.  I then removed excess bone with the rongeur and sagittal saw.  I used the appropriately sized template and drilled the three lug holes.  I then put the trial in place and measured the thickness with the calipers to ensure recreation of the native thickness.  The trial was then removed and the patella subluxed and the knee brought into flexion.  A homan retractor was place to retract and protect the patella and lateral structures.  A Z-retractor was place medially to protect the medial structures.  The extra-medullary alignment system was used to make cut the tibial articular surface perpendicular to the anamotic axis of the tibia and in 3 degrees of posterior slope.  The cut surface and alignment jig was removed.  I then used the intramedullary alignment guide to make a 6 valgus cut on the distal femur.  I then marked out the epicondylar axis on the distal femur.  The posterior condylar axis measured 3 degrees.  I then used the anterior referencing sizer and measured the femur to be a size 7.  The 4-In-1 cutting block was screwed into place in external rotation matching the posterior condylar angle, making our cuts perpendicular to the epicondylar axis.  Anterior, posterior and chamfer cuts were made with the sagittal saw.  The cutting block and cut pieces were  removed.  A lamina spreader was placed in 90 degrees of flexion.  The ACL, PCL, menisci, and posterior condylar osteophytes were removed.  A 12 mm spacer blocked was found to offer good flexion and extension gap balance after mild in degree releasing.   The scoop retractor was then placed and the femoral finishing block was pinned in place.  The small sagittal saw was used as well as the lug drill to finish the  femur.  The block and cut surfaces were removed and the medullary canal hole filled with autograft bone from the cut pieces.  The tibia was delivered forward in deep flexion and external rotation.  A size E tray was selected and pinned into place centered on the medial 1/3 of the tibial tubercle.  The reamer and keel was used to prepare the tibia through the tray.    I then trialed with the size 7 femur, size E tibia, a 12 mm insert and the 32 patella.  I had excellent flexion/extension gap balance, excellent patella tracking.  Flexion was full and beyond 120 degrees; extension was zero.  These components were chosen and the staff opened them to me on the back table while the knee was lavaged copiously and the cement mixed.  The soft tissue was infiltrated with 60cc of exparel 1.3% through a 21 gauge needle.  I cemented in the components and removed all excess cement.  The polyethylene tibial component was snapped into place and the knee placed in extension while cement was hardening.  The capsule was infilltrated with 30cc of .25% Marcaine with epinephrine.  A hemovac was place in the joint exiting superolaterally.  A pain pump was place superomedially superficial to the arthrotomy.  Once the cement was hard, the tourniquet was let down.  Hemostasis was obtained.  The arthrotomy was closed with figure-8 #1 vicryl sutures.  The deep soft tissues were closed with #0 vicryls and the subcuticular layer closed with a running #2-0 vicryl.  The skin was reapproximated and closed with skin staples.  The wound was dressed with xeroform, 4 x4's, 2 ABD sponges, a single layer of webril and a TED stocking.   The patient was then awakened, extubated, and taken to the recovery room in stable condition.  BLOOD LOSS:  300cc DRAINS: 1 hemovac, 1 pain catheter COMPLICATIONS:  None.  PLAN OF CARE: Overnight/Outpatient  PATIENT DISPOSITION:  PACU - hemodynamically stable.   Delay start of Pharmacological VTE agent  (>24hrs) due to surgical blood loss or risk of bleeding:  not applicable  Please fax a copy of this op note to my office at [phone] (please only include page 1 and 2 of the Case Information op note)

## 2017-12-26 NOTE — Progress Notes (Signed)
SPORTS MEDICINE AND JOINT REPLACEMENT  Lara Mulch, MD    Carlyon Shadow, PA-C [address], [place], [place]  [address]                             [phone]   PROGRESS NOTE  Subjective:  negative for Chest Pain  negative for Shortness of Breath  negative for Nausea/Vomiting   negative for Calf Pain  negative for Bowel Movement   Tolerating Diet: yes         Patient reports pain as 3 on 0-10 scale.    Objective: Vital signs in last 24 hours:    Patient Vitals for the past 24 hrs:  BP Temp Temp src Pulse Resp SpO2  12/26/17 0645 (!) 138/46 98.2 F (36.8 C) Oral 74 18 97 %  12/26/17 0004 (!) 121/49 (!) 97.4 F (36.3 C) Oral 64 18 97 %  12/25/17 2022 (!) 125/54 98.2 F (36.8 C) Oral 65 19 97 %  12/25/17 1300 140/71 (!) 97.3 F (36.3 C) Axillary 70 20 98 %  12/25/17 1215 (!) 117/58 97.6 F (36.4 C) - 62 (!) 26 100 %  12/25/17 1200 (!) 118/56 - - 63 15 98 %  12/25/17 1145 (!) 114/55 - - 64 13 98 %  12/25/17 1130 (!) 120/59 - - (!) 58 12 100 %  12/25/17 1115 (!) 119/57 - - (!) 57 11 96 %  12/25/17 1100 (!) 118/56 - - (!) 59 (!) 21 99 %  12/25/17 1045 (!) 116/53 - - (!) 59 14 97 %  12/25/17 1030 (!) 112/55 - - 61 12 99 %  12/25/17 1018 107/61 (!) 97.5 F (36.4 C) - 62 17 100 %  12/25/17 0810 (!) 144/51 - - 60 (!) 9 98 %  12/25/17 0805 (!) 157/45 - - 68 12 97 %  12/25/17 0800 (!) 178/103 - - 66 17 100 %    @flow {1959:LAST@   Intake/Output from previous day:   02/04 0701 - 02/05 0700 In: 600 [I.V.:600] Out: 2410 [Urine:2400]   Intake/Output this shift:   No intake/output data recorded.   Intake/Output      02/04 0701 - 02/05 0700 02/05 0701 - 02/06 0700   I.V. (mL/kg) 600 (6.4)    Total Intake(mL/kg) 600 (6.4)    Urine (mL/kg/hr) 2400 (1.1)    Blood 10    Total Output 2410    Net -1810            LABORATORY DATA: Recent Labs    12/26/17 0600  WBC 14.9*  HGB 12.4  HCT 37.3  PLT 280   No results for input(s): NA, K, CL, CO2, BUN,  CREATININE, GLUCOSE, CALCIUM in the last 168 hours. Lab Results  Component Value Date   INR 1.00 06/09/2011    Examination:  General appearance: alert, cooperative and no distress Extremities: extremities normal, atraumatic, no cyanosis or edema  Wound Exam: clean, dry, intact   Drainage:  None: wound tissue dry  Motor Exam: Quadriceps and Hamstrings Intact  Sensory Exam: Superficial Peroneal, Deep Peroneal and Tibial normal   Assessment:    1 Day Post-Op  Procedure(s) (LRB): TOTAL KNEE ARTHROPLASTY (Right)  ADDITIONAL DIAGNOSIS:  Active Problems:   S/P total knee replacement     Plan: Physical Therapy as ordered Weight Bearing as Tolerated (WBAT)  DVT Prophylaxis:  Aspirin  DISCHARGE PLAN: Home  DISCHARGE NEEDS: HHPT   Patient doing well. Expected D/C  home today         Donia Ast 12/26/2017, 7:20 AM

## 2017-12-26 NOTE — Evaluation (Addendum)
Occupational Therapy Evaluation and Discharge Patient Details Name: Tara [name] MRN: [id number] DOB: 07-[name]-1936 Today's Date: 12/26/2017    History of Present Illness 82 y.o. female admitted on 12/25/17 for elective R TKA.  Pt with significant PMH of L TKA, dependent edema, prediabetes, recurrent UIT, benign positional vertigo, hypothyroidism, and essential hypertension.   Clinical Impression   PTA, pt was living in independent living and modified independent with cane for ADL and functional mobility. She has the ability to utilize call button in her shower to nurses at ALF associated with her facility if needed. Pt currently able to complete toilet transfers with supervision and RW, shower transfers with min assist and RW, and LB ADL with min guard assist. Her daughter was present for all education and demonstrates ability to assist. Pt will have 24 hour assistance from multiple family members (including daughter) post-acute D/C. Educated pt/daughter concerning fall prevention, knee precautions during ADL, and safe dressing techniques and they verbalize and demonstrate understanding. All OT education complete at this time and OT will sign off acutely.     Follow Up Recommendations  No OT follow up;Supervision/Assistance - 24 hour    Equipment Recommendations  None recommended by OT    Recommendations for Other Services       Precautions / Restrictions Precautions Precautions: Knee Precaution Booklet Issued: Yes (comment) Precaution Comments: Reviewed knee precautions related to ADL.  Restrictions Weight Bearing Restrictions: Yes RLE Weight Bearing: Weight bearing as tolerated      Mobility Bed Mobility Overal bed mobility: Modified Independent Bed Mobility: Supine to Sit           General bed mobility comments: OOB in recliner in bathroom  Transfers Overall transfer level: Needs assistance Equipment used: Rolling walker (2 wheeled) Transfers: Sit to/from Stand Sit to  Stand: Supervision         General transfer comment: supervision for safety    Balance Overall balance assessment: Needs assistance Sitting-balance support: Feet supported;No upper extremity supported Sitting balance-Leahy Scale: Good     Standing balance support: Bilateral upper extremity supported Standing balance-Leahy Scale: Fair Standing balance comment: Able to statically stand for ADL tasks without UE support.                            ADL either performed or assessed with clinical judgement   ADL Overall ADL's : Needs assistance/impaired Eating/Feeding: Set up;Sitting   Grooming: Supervision/safety;Standing   Upper Body Bathing: Set up;Sitting   Lower Body Bathing: Sit to/from stand;With caregiver independent assisting;Min guard   Upper Body Dressing : Set up;Sitting   Lower Body Dressing: With caregiver independent assisting;Sit to/from stand;Min guard   Toilet Transfer: Supervision/safety;Ambulation;BSC   Toileting- Water quality scientist and Hygiene: Supervision/safety;Sit to/from stand   Tub/ Banker: Walk-in shower;With caregiver independent assisting;Rolling walker;Shower seat;Ambulation;Minimal assistance   Functional mobility during ADLs: Min guard;Rolling walker General ADL Comments: Pt and daughter educated concerning compensatory techniques for LB ADL as well as safe shower transfers.      Vision Baseline Vision/History: Wears glasses Patient Visual Report: No change from baseline Vision Assessment?: No apparent visual deficits     Perception     Praxis      Pertinent Vitals/Pain Pain Assessment: Faces Faces Pain Scale: Hurts little more Pain Location: right knee Pain Descriptors / Indicators: Sore Pain Intervention(s): Limited activity within patient's tolerance;Monitored during session;Repositioned     Hand Dominance Right   Extremity/Trunk Assessment Upper Extremity Assessment Upper  Extremity Assessment: Overall  WFL for tasks assessed   Lower Extremity Assessment Lower Extremity Assessment: RLE deficits/detail RLE Deficits / Details: Decreased strength and ROM as expected post-operatively.    Cervical / Trunk Assessment Cervical / Trunk Assessment: Other exceptions Cervical / Trunk Exceptions: h/o low back surgery   Communication Communication Communication: No difficulties   Cognition Arousal/Alertness: Awake/alert Behavior During Therapy: WFL for tasks assessed/performed Overall Cognitive Status: Within Functional Limits for tasks assessed                                     General Comments       Exercises Exercises: Total Joint Total Joint Exercises Quad Sets: AROM;Right;10 reps Straight Leg Raises: AROM;Right;10 reps Long Arc Quad: AROM;Right;15 reps;Seated Knee Flexion: AROM;Right;10 reps;Seated;Other (comment)(10 sec holds) Goniometric ROM: approx 0-92   Shoulder Instructions      Home Living Family/patient expects to be discharged to:: Private residence Living Arrangements: Alone Available Help at Discharge: Family;Available 24 hours/day Type of Home: Independent living facility Home Access: Level entry     Home Layout: One level     Bathroom Shower/Tub: Occupational psychologist: Handicapped height Bathroom Accessibility: Yes How Accessible: Accessible via walker Home Equipment: Cane - quad;Grab bars - toilet;Shower seat - built in;Walker - Psychologist, educational - 2 wheels;Bedside commode   Additional Comments: also had CPM delivered       Prior Functioning/Environment Level of Independence: Independent with assistive device(s)        Comments: used SPC PTA        OT Problem List: Decreased strength;Decreased activity tolerance;Impaired balance (sitting and/or standing);Decreased safety awareness;Decreased knowledge of use of DME or AE;Decreased knowledge of precautions;Pain      OT Treatment/Interventions:      OT Goals(Current  goals can be found in the care plan section) Acute Rehab OT Goals Patient Stated Goal: to go home ASAP OT Goal Formulation: With patient  OT Frequency:     Barriers to D/C:            Co-evaluation              AM-PAC PT "6 Clicks" Daily Activity     Outcome Measure Help from another person eating meals?: A Little Help from another person taking care of personal grooming?: A Little Help from another person toileting, which includes using toliet, bedpan, or urinal?: A Little Help from another person bathing (including washing, rinsing, drying)?: A Little Help from another person to put on and taking off regular upper body clothing?: A Little Help from another person to put on and taking off regular lower body clothing?: A Little 6 Click Score: 18   End of Session Equipment Utilized During Treatment: Rolling walker CPM Right Knee CPM Right Knee: Off Nurse Communication: Mobility status;Other (comment)(nurse tech - pt up in bathroom bathing)  Activity Tolerance: Patient tolerated treatment well Patient left: in chair;with call bell/phone within reach;with family/visitor present(in bathroom bathing with family assist)  OT Visit Diagnosis: Other abnormalities of gait and mobility (R26.89);Pain Pain - Right/Left: Right Pain - part of body: Knee                Time: 5284-1324 OT Time Calculation (min): 28 min Charges:  OT General Charges $OT Visit: 1 Visit OT Evaluation $OT Eval Moderate Complexity: 1 Mod OT Treatments $Self Care/Home Management : 8-22 mins G-Codes:     Lonya Johannesen A  Krisna Omar, MS OTR/L  Pager: [place] A Basir Niven 12/26/2017, 10:31 AM

## 2017-12-26 NOTE — Progress Notes (Signed)
Removed IV, provided discharge education/instructions, all questions and concerns addressed, Pt not in distress, discharged home with belongings. 

## 2017-12-26 NOTE — Plan of Care (Signed)
  Activity: Risk for activity intolerance will decrease 12/26/2017 0838 - Progressing by Williams Che, RN   Nutrition: Adequate nutrition will be maintained 12/26/2017 0838 - Progressing by Williams Che, RN   Elimination: Will not experience complications related to bowel motility 12/26/2017 0838 - Progressing by Williams Che, RN   Pain Managment: General experience of comfort will improve 12/26/2017 0838 - Progressing by Williams Che, RN   Safety: Ability to remain free from injury will improve 12/26/2017 0838 - Progressing by Williams Che, RN

## 2017-12-26 NOTE — Care Management Obs Status (Signed)
[facility] NOTIFICATION   Patient Details  Name: Tara [name] MRN: [id number] Date of Birth: Mar 19, 1936   Medicare Observation Status Notification Given:    pATIENT LEFT PRIOR TO SIGNING CM HAD EXPLAINED OBS STATUS TO PATIENT   Ninfa Meeker, RN 12/26/2017, 2:32 PM

## 2017-12-26 NOTE — Progress Notes (Signed)
Physical Therapy Treatment Patient Details Name: Tara [name] MRN: [id number] DOB: Mar 25, 1936 Today's Date: 12/26/2017    History of Present Illness 82 y.o. female admitted on 12/25/17 for elective R TKA.  Pt with significant PMH of L TKA,     PT Comments    Patient is making good progress with PT.  From a mobility standpoint anticipate patient will be ready for DC home when medically ready.    Follow Up Recommendations  DC plan and follow up therapy as arranged by surgeon;Supervision for mobility/OOB     Equipment Recommendations  None recommended by PT    Recommendations for Other Services       Precautions / Restrictions Precautions Precautions: Knee Precaution Booklet Issued: Yes (comment) Precaution Comments: precautions reviewed with pt  Restrictions Weight Bearing Restrictions: Yes RLE Weight Bearing: Weight bearing as tolerated    Mobility  Bed Mobility Overal bed mobility: Modified Independent Bed Mobility: Supine to Sit           General bed mobility comments: increased time and effort needed  Transfers Overall transfer level: Needs assistance Equipment used: Rolling walker (2 wheeled) Transfers: Sit to/from Stand Sit to Stand: Supervision         General transfer comment: supervision for safety  Ambulation/Gait Ambulation/Gait assistance: Min guard Ambulation Distance (Feet): 160 Feet Assistive device: Rolling walker (2 wheeled) Gait Pattern/deviations: Step-through pattern;Decreased stride length Gait velocity: decreased   General Gait Details: cues for safe proximity to RW and posture   Stairs            Wheelchair Mobility    Modified Rankin (Stroke Patients Only)       Balance Overall balance assessment: Needs assistance Sitting-balance support: Feet supported;No upper extremity supported Sitting balance-Leahy Scale: Good     Standing balance support: Bilateral upper extremity supported Standing balance-Leahy Scale:  Poor                              Cognition Arousal/Alertness: Awake/alert Behavior During Therapy: WFL for tasks assessed/performed Overall Cognitive Status: Within Functional Limits for tasks assessed                                        Exercises Total Joint Exercises Quad Sets: AROM;Right;10 reps Straight Leg Raises: AROM;Right;10 reps Long Arc Quad: AROM;Right;15 reps;Seated Knee Flexion: AROM;Right;10 reps;Seated;Other (comment)(10 sec holds) Goniometric ROM: approx 0-92    General Comments        Pertinent Vitals/Pain Pain Assessment: Faces Faces Pain Scale: Hurts little more Pain Location: right knee Pain Descriptors / Indicators: Sore Pain Intervention(s): Limited activity within patient's tolerance;Monitored during session;Repositioned    Home Living Family/patient expects to be discharged to:: Private residence Living Arrangements: Alone Available Help at Discharge: Family;Available 24 hours/day Type of Home: Independent living facility Home Access: Level entry   Home Layout: One level Home Equipment: Cane - quad;Grab bars - toilet;Shower seat - built in;Walker - Psychologist, educational - 2 wheels;Bedside commode Additional Comments: also had CPM delivered     Prior Function Level of Independence: Independent with assistive device(s)          PT Goals (current goals can now be found in the care plan section) Acute Rehab PT Goals PT Goal Formulation: With patient Time For Goal Achievement: 01/01/18 Potential to Achieve Goals: Good Progress towards PT goals: Progressing toward goals  Frequency    7X/week      PT Plan Current plan remains appropriate    Co-evaluation              AM-PAC PT "6 Clicks" Daily Activity  Outcome Measure  Difficulty turning over in bed (including adjusting bedclothes, sheets and blankets)?: A Little Difficulty moving from lying on back to sitting on the side of the bed? : A  Little Difficulty sitting down on and standing up from a chair with arms (e.g., wheelchair, bedside commode, etc,.)?: A Little Help needed moving to and from a bed to chair (including a wheelchair)?: A Little Help needed walking in hospital room?: A Little Help needed climbing 3-5 steps with a railing? : A Little 6 Click Score: 18    End of Session Equipment Utilized During Treatment: Gait belt Activity Tolerance: Patient tolerated treatment well Patient left: in chair;with call bell/phone within reach;with family/visitor present Nurse Communication: Mobility status PT Visit Diagnosis: Muscle weakness (generalized) (M62.81);Difficulty in walking, not elsewhere classified (R26.2);Pain Pain - Right/Left: Right Pain - part of body: Knee     Time: 1610-9604 PT Time Calculation (min) (ACUTE ONLY): 27 min  Charges:  $Gait Training: 8-22 mins $Therapeutic Exercise: 8-22 mins                    G Codes:       Earney Navy, PTA Pager: [phone]     Darliss Cheney 12/26/2017, 9:05 AM

## 2017-12-27 DIAGNOSIS — F329 Major depressive disorder, single episode, unspecified: Secondary | ICD-10-CM | POA: Diagnosis not present

## 2017-12-27 DIAGNOSIS — M1712 Unilateral primary osteoarthritis, left knee: Secondary | ICD-10-CM | POA: Diagnosis not present

## 2017-12-27 DIAGNOSIS — Z6838 Body mass index (BMI) 38.0-38.9, adult: Secondary | ICD-10-CM | POA: Diagnosis not present

## 2017-12-27 DIAGNOSIS — E669 Obesity, unspecified: Secondary | ICD-10-CM | POA: Diagnosis not present

## 2017-12-27 DIAGNOSIS — I10 Essential (primary) hypertension: Secondary | ICD-10-CM | POA: Diagnosis not present

## 2017-12-27 DIAGNOSIS — Z471 Aftercare following joint replacement surgery: Secondary | ICD-10-CM | POA: Diagnosis not present

## 2017-12-28 DIAGNOSIS — F329 Major depressive disorder, single episode, unspecified: Secondary | ICD-10-CM | POA: Diagnosis not present

## 2017-12-28 DIAGNOSIS — Z6838 Body mass index (BMI) 38.0-38.9, adult: Secondary | ICD-10-CM | POA: Diagnosis not present

## 2017-12-28 DIAGNOSIS — E669 Obesity, unspecified: Secondary | ICD-10-CM | POA: Diagnosis not present

## 2017-12-28 DIAGNOSIS — I10 Essential (primary) hypertension: Secondary | ICD-10-CM | POA: Diagnosis not present

## 2017-12-28 DIAGNOSIS — M1712 Unilateral primary osteoarthritis, left knee: Secondary | ICD-10-CM | POA: Diagnosis not present

## 2017-12-28 DIAGNOSIS — Z471 Aftercare following joint replacement surgery: Secondary | ICD-10-CM | POA: Diagnosis not present

## 2017-12-29 DIAGNOSIS — Z471 Aftercare following joint replacement surgery: Secondary | ICD-10-CM | POA: Diagnosis not present

## 2017-12-29 DIAGNOSIS — E669 Obesity, unspecified: Secondary | ICD-10-CM | POA: Diagnosis not present

## 2017-12-29 DIAGNOSIS — I10 Essential (primary) hypertension: Secondary | ICD-10-CM | POA: Diagnosis not present

## 2017-12-29 DIAGNOSIS — Z6838 Body mass index (BMI) 38.0-38.9, adult: Secondary | ICD-10-CM | POA: Diagnosis not present

## 2017-12-29 DIAGNOSIS — M1712 Unilateral primary osteoarthritis, left knee: Secondary | ICD-10-CM | POA: Diagnosis not present

## 2017-12-29 DIAGNOSIS — F329 Major depressive disorder, single episode, unspecified: Secondary | ICD-10-CM | POA: Diagnosis not present

## 2018-01-01 DIAGNOSIS — F329 Major depressive disorder, single episode, unspecified: Secondary | ICD-10-CM | POA: Diagnosis not present

## 2018-01-01 DIAGNOSIS — Z471 Aftercare following joint replacement surgery: Secondary | ICD-10-CM | POA: Diagnosis not present

## 2018-01-01 DIAGNOSIS — M1712 Unilateral primary osteoarthritis, left knee: Secondary | ICD-10-CM | POA: Diagnosis not present

## 2018-01-01 DIAGNOSIS — Z6838 Body mass index (BMI) 38.0-38.9, adult: Secondary | ICD-10-CM | POA: Diagnosis not present

## 2018-01-01 DIAGNOSIS — I10 Essential (primary) hypertension: Secondary | ICD-10-CM | POA: Diagnosis not present

## 2018-01-01 DIAGNOSIS — E669 Obesity, unspecified: Secondary | ICD-10-CM | POA: Diagnosis not present

## 2018-01-03 DIAGNOSIS — M1712 Unilateral primary osteoarthritis, left knee: Secondary | ICD-10-CM | POA: Diagnosis not present

## 2018-01-03 DIAGNOSIS — F329 Major depressive disorder, single episode, unspecified: Secondary | ICD-10-CM | POA: Diagnosis not present

## 2018-01-03 DIAGNOSIS — I10 Essential (primary) hypertension: Secondary | ICD-10-CM | POA: Diagnosis not present

## 2018-01-03 DIAGNOSIS — Z6838 Body mass index (BMI) 38.0-38.9, adult: Secondary | ICD-10-CM | POA: Diagnosis not present

## 2018-01-03 DIAGNOSIS — E669 Obesity, unspecified: Secondary | ICD-10-CM | POA: Diagnosis not present

## 2018-01-03 DIAGNOSIS — Z471 Aftercare following joint replacement surgery: Secondary | ICD-10-CM | POA: Diagnosis not present

## 2018-01-04 DIAGNOSIS — Z96651 Presence of right artificial knee joint: Secondary | ICD-10-CM | POA: Diagnosis not present

## 2018-01-05 DIAGNOSIS — M25561 Pain in right knee: Secondary | ICD-10-CM | POA: Diagnosis not present

## 2018-01-05 DIAGNOSIS — Z96651 Presence of right artificial knee joint: Secondary | ICD-10-CM | POA: Diagnosis not present

## 2018-01-05 DIAGNOSIS — R2689 Other abnormalities of gait and mobility: Secondary | ICD-10-CM | POA: Diagnosis not present

## 2018-01-05 DIAGNOSIS — N390 Urinary tract infection, site not specified: Secondary | ICD-10-CM | POA: Diagnosis not present

## 2018-01-05 DIAGNOSIS — M62551 Muscle wasting and atrophy, not elsewhere classified, right thigh: Secondary | ICD-10-CM | POA: Diagnosis not present

## 2018-01-08 DIAGNOSIS — M25561 Pain in right knee: Secondary | ICD-10-CM | POA: Diagnosis not present

## 2018-01-08 DIAGNOSIS — Z96651 Presence of right artificial knee joint: Secondary | ICD-10-CM | POA: Diagnosis not present

## 2018-01-08 DIAGNOSIS — R2689 Other abnormalities of gait and mobility: Secondary | ICD-10-CM | POA: Diagnosis not present

## 2018-01-08 DIAGNOSIS — M62551 Muscle wasting and atrophy, not elsewhere classified, right thigh: Secondary | ICD-10-CM | POA: Diagnosis not present

## 2018-01-17 DIAGNOSIS — R2689 Other abnormalities of gait and mobility: Secondary | ICD-10-CM | POA: Diagnosis not present

## 2018-01-17 DIAGNOSIS — M62551 Muscle wasting and atrophy, not elsewhere classified, right thigh: Secondary | ICD-10-CM | POA: Diagnosis not present

## 2018-01-17 DIAGNOSIS — M25561 Pain in right knee: Secondary | ICD-10-CM | POA: Diagnosis not present

## 2018-01-17 DIAGNOSIS — Z96651 Presence of right artificial knee joint: Secondary | ICD-10-CM | POA: Diagnosis not present

## 2018-01-22 DIAGNOSIS — M62551 Muscle wasting and atrophy, not elsewhere classified, right thigh: Secondary | ICD-10-CM | POA: Diagnosis not present

## 2018-01-22 DIAGNOSIS — R2689 Other abnormalities of gait and mobility: Secondary | ICD-10-CM | POA: Diagnosis not present

## 2018-01-22 DIAGNOSIS — Z96651 Presence of right artificial knee joint: Secondary | ICD-10-CM | POA: Diagnosis not present

## 2018-01-22 DIAGNOSIS — M25561 Pain in right knee: Secondary | ICD-10-CM | POA: Diagnosis not present

## 2018-01-24 DIAGNOSIS — M62551 Muscle wasting and atrophy, not elsewhere classified, right thigh: Secondary | ICD-10-CM | POA: Diagnosis not present

## 2018-01-24 DIAGNOSIS — R2689 Other abnormalities of gait and mobility: Secondary | ICD-10-CM | POA: Diagnosis not present

## 2018-01-24 DIAGNOSIS — Z96651 Presence of right artificial knee joint: Secondary | ICD-10-CM | POA: Diagnosis not present

## 2018-01-24 DIAGNOSIS — M25561 Pain in right knee: Secondary | ICD-10-CM | POA: Diagnosis not present

## 2018-01-26 DIAGNOSIS — Z96651 Presence of right artificial knee joint: Secondary | ICD-10-CM | POA: Diagnosis not present

## 2018-01-26 DIAGNOSIS — M62551 Muscle wasting and atrophy, not elsewhere classified, right thigh: Secondary | ICD-10-CM | POA: Diagnosis not present

## 2018-01-26 DIAGNOSIS — M25561 Pain in right knee: Secondary | ICD-10-CM | POA: Diagnosis not present

## 2018-01-26 DIAGNOSIS — R2689 Other abnormalities of gait and mobility: Secondary | ICD-10-CM | POA: Diagnosis not present

## 2018-02-01 DIAGNOSIS — M62551 Muscle wasting and atrophy, not elsewhere classified, right thigh: Secondary | ICD-10-CM | POA: Diagnosis not present

## 2018-02-01 DIAGNOSIS — R2689 Other abnormalities of gait and mobility: Secondary | ICD-10-CM | POA: Diagnosis not present

## 2018-02-01 DIAGNOSIS — M25561 Pain in right knee: Secondary | ICD-10-CM | POA: Diagnosis not present

## 2018-02-01 DIAGNOSIS — Z96651 Presence of right artificial knee joint: Secondary | ICD-10-CM | POA: Diagnosis not present

## 2018-02-06 DIAGNOSIS — N390 Urinary tract infection, site not specified: Secondary | ICD-10-CM | POA: Diagnosis not present

## 2018-02-06 DIAGNOSIS — M25561 Pain in right knee: Secondary | ICD-10-CM | POA: Diagnosis not present

## 2018-02-06 DIAGNOSIS — Z96651 Presence of right artificial knee joint: Secondary | ICD-10-CM | POA: Diagnosis not present

## 2018-02-06 DIAGNOSIS — N3289 Other specified disorders of bladder: Secondary | ICD-10-CM | POA: Diagnosis not present

## 2018-02-06 DIAGNOSIS — R2689 Other abnormalities of gait and mobility: Secondary | ICD-10-CM | POA: Diagnosis not present

## 2018-02-06 DIAGNOSIS — M62551 Muscle wasting and atrophy, not elsewhere classified, right thigh: Secondary | ICD-10-CM | POA: Diagnosis not present

## 2018-02-08 ENCOUNTER — Other Ambulatory Visit: Payer: Self-pay | Admitting: Internal Medicine

## 2018-02-13 DIAGNOSIS — M25561 Pain in right knee: Secondary | ICD-10-CM | POA: Diagnosis not present

## 2018-02-13 DIAGNOSIS — Z96651 Presence of right artificial knee joint: Secondary | ICD-10-CM | POA: Diagnosis not present

## 2018-02-13 DIAGNOSIS — R2689 Other abnormalities of gait and mobility: Secondary | ICD-10-CM | POA: Diagnosis not present

## 2018-02-13 DIAGNOSIS — M62551 Muscle wasting and atrophy, not elsewhere classified, right thigh: Secondary | ICD-10-CM | POA: Diagnosis not present

## 2018-02-16 DIAGNOSIS — M62551 Muscle wasting and atrophy, not elsewhere classified, right thigh: Secondary | ICD-10-CM | POA: Diagnosis not present

## 2018-02-16 DIAGNOSIS — M25561 Pain in right knee: Secondary | ICD-10-CM | POA: Diagnosis not present

## 2018-02-16 DIAGNOSIS — Z96651 Presence of right artificial knee joint: Secondary | ICD-10-CM | POA: Diagnosis not present

## 2018-02-16 DIAGNOSIS — R2689 Other abnormalities of gait and mobility: Secondary | ICD-10-CM | POA: Diagnosis not present

## 2018-02-21 DIAGNOSIS — Z96651 Presence of right artificial knee joint: Secondary | ICD-10-CM | POA: Diagnosis not present

## 2018-02-21 DIAGNOSIS — R2689 Other abnormalities of gait and mobility: Secondary | ICD-10-CM | POA: Diagnosis not present

## 2018-02-21 DIAGNOSIS — M62551 Muscle wasting and atrophy, not elsewhere classified, right thigh: Secondary | ICD-10-CM | POA: Diagnosis not present

## 2018-02-21 DIAGNOSIS — M25561 Pain in right knee: Secondary | ICD-10-CM | POA: Diagnosis not present

## 2018-02-22 DIAGNOSIS — M25561 Pain in right knee: Secondary | ICD-10-CM | POA: Diagnosis not present

## 2018-02-22 DIAGNOSIS — M62551 Muscle wasting and atrophy, not elsewhere classified, right thigh: Secondary | ICD-10-CM | POA: Diagnosis not present

## 2018-02-22 DIAGNOSIS — Z96651 Presence of right artificial knee joint: Secondary | ICD-10-CM | POA: Diagnosis not present

## 2018-02-22 DIAGNOSIS — R2689 Other abnormalities of gait and mobility: Secondary | ICD-10-CM | POA: Diagnosis not present

## 2018-02-23 DIAGNOSIS — N390 Urinary tract infection, site not specified: Secondary | ICD-10-CM | POA: Diagnosis not present

## 2018-02-27 DIAGNOSIS — M25561 Pain in right knee: Secondary | ICD-10-CM | POA: Diagnosis not present

## 2018-02-27 DIAGNOSIS — M62551 Muscle wasting and atrophy, not elsewhere classified, right thigh: Secondary | ICD-10-CM | POA: Diagnosis not present

## 2018-02-27 DIAGNOSIS — Z96651 Presence of right artificial knee joint: Secondary | ICD-10-CM | POA: Diagnosis not present

## 2018-02-27 DIAGNOSIS — R2689 Other abnormalities of gait and mobility: Secondary | ICD-10-CM | POA: Diagnosis not present

## 2018-03-01 DIAGNOSIS — Z96651 Presence of right artificial knee joint: Secondary | ICD-10-CM | POA: Diagnosis not present

## 2018-03-01 DIAGNOSIS — R2689 Other abnormalities of gait and mobility: Secondary | ICD-10-CM | POA: Diagnosis not present

## 2018-03-01 DIAGNOSIS — M25561 Pain in right knee: Secondary | ICD-10-CM | POA: Diagnosis not present

## 2018-03-01 DIAGNOSIS — M62551 Muscle wasting and atrophy, not elsewhere classified, right thigh: Secondary | ICD-10-CM | POA: Diagnosis not present

## 2018-03-20 DIAGNOSIS — N390 Urinary tract infection, site not specified: Secondary | ICD-10-CM | POA: Diagnosis not present

## 2018-04-26 ENCOUNTER — Ambulatory Visit (INDEPENDENT_AMBULATORY_CARE_PROVIDER_SITE_OTHER): Payer: Medicare Other | Admitting: Internal Medicine

## 2018-04-26 ENCOUNTER — Encounter: Payer: Self-pay | Admitting: Internal Medicine

## 2018-04-26 VITALS — BP 160/80 | HR 64 | Ht 63.0 in | Wt 192.0 lb

## 2018-04-26 DIAGNOSIS — I10 Essential (primary) hypertension: Secondary | ICD-10-CM | POA: Diagnosis not present

## 2018-04-26 DIAGNOSIS — Z86000 Personal history of in-situ neoplasm of breast: Secondary | ICD-10-CM

## 2018-04-26 DIAGNOSIS — R4781 Slurred speech: Secondary | ICD-10-CM | POA: Diagnosis not present

## 2018-04-26 DIAGNOSIS — H8110 Benign paroxysmal vertigo, unspecified ear: Secondary | ICD-10-CM

## 2018-04-26 DIAGNOSIS — R030 Elevated blood-pressure reading, without diagnosis of hypertension: Secondary | ICD-10-CM

## 2018-04-26 DIAGNOSIS — R42 Dizziness and giddiness: Secondary | ICD-10-CM | POA: Diagnosis not present

## 2018-04-26 DIAGNOSIS — F411 Generalized anxiety disorder: Secondary | ICD-10-CM | POA: Diagnosis not present

## 2018-04-26 DIAGNOSIS — E039 Hypothyroidism, unspecified: Secondary | ICD-10-CM | POA: Diagnosis not present

## 2018-04-26 DIAGNOSIS — F4321 Adjustment disorder with depressed mood: Secondary | ICD-10-CM | POA: Diagnosis not present

## 2018-04-26 NOTE — Progress Notes (Signed)
   Subjective:    Patient ID: Tara [name], female    DOB: [birth year], 82 y.o.   MRN: [id number]  HPI 82 year old Female for 6 month recheck. Recently cried a lot grieving husband's death  About 2023/05/01 had episode of slurred speech for some 15 minutes.Someone noticed it when they called her. Says the following day had slurred speech about 3 minutes the following day in the evening. No vomiting. No headache.  Also, had vomiting and diarrhea on Mother's Day.   Had dizziness May 26th and had Valium on hand.This seemed to help. Longstanding history of recurrent vertigo.  Had TKR February 2019.  Seeing Urologist in [place] for recurrent UTIs  Discussion about traveling here from [place].  It is difficult for her.  She may find it easier to find a primary care physician in [place].   Review of Systems see above     Objective:   Physical Exam Neck is supple.  Chest clear.  Cranial nerves II through XII grossly intact.  No focal deficits on brief neurological exam.  No carotid bruits.  Cardiac exam regular rate and rhythm.  No lower extremity edema.       Assessment & Plan:  Episode of slurred speech  In May- ?TIA suggest neurology referral for evaluation  Benign positional vertigo- may take meclizine or Valium this is long-standing and may be related to stress and anxiety.  She still grieving the loss of husband.  Hypothyroidism TSH is stable on thyroid replacement  Essential hypertension-blood pressure elevated today at 160/80 but she seems a bit anxious  History of anxiety  Grief reaction from losing her husband several months ago.  Status post total knee replacement right knee  Obesity-discussed diet.  Not really able to exercise very well  History of breast cancer  History of urinary tract infections-no recent infections  History of adenomatous colon polyps  Asked patient to monitor blood pressure at home  His Valium for anxiety/sleep  BMI 34  History of  dependent edema-not taking Demadex due to urinary frequency.  Blood pressure would likely respond to adding Demadex back

## 2018-04-27 LAB — CBC WITH DIFFERENTIAL/PLATELET
Basophils Absolute: 80 cells/uL (ref 0–200)
Basophils Relative: 1.1 %
Eosinophils Absolute: 299 cells/uL (ref 15–500)
Eosinophils Relative: 4.1 %
HCT: 39.8 % (ref 35.0–45.0)
Hemoglobin: 13.6 g/dL (ref 11.7–15.5)
Lymphs Abs: 3548 cells/uL (ref 850–3900)
MCH: 31.6 pg (ref 27.0–33.0)
MCHC: 34.2 g/dL (ref 32.0–36.0)
MCV: 92.6 fL (ref 80.0–100.0)
MPV: 11.5 fL (ref 7.5–12.5)
Monocytes Relative: 7 %
Neutro Abs: 2862 cells/uL (ref 1500–7800)
Neutrophils Relative %: 39.2 %
Platelets: 270 10*3/uL (ref 140–400)
RBC: 4.3 10*6/uL (ref 3.80–5.10)
RDW: 12.4 % (ref 11.0–15.0)
Total Lymphocyte: 48.6 %
WBC mixed population: 511 cells/uL (ref 200–950)
WBC: 7.3 10*3/uL (ref 3.8–10.8)

## 2018-04-27 LAB — COMPLETE METABOLIC PANEL WITH GFR
AG Ratio: 1.4 (calc) (ref 1.0–2.5)
ALT: 15 U/L (ref 6–29)
AST: 22 U/L (ref 10–35)
Albumin: 4.1 g/dL (ref 3.6–5.1)
Alkaline phosphatase (APISO): 80 U/L (ref 33–130)
BUN: 11 mg/dL (ref 7–25)
CO2: 27 mmol/L (ref 20–32)
Calcium: 9.6 mg/dL (ref 8.6–10.4)
Chloride: 104 mmol/L (ref 98–110)
Creat: 0.76 mg/dL (ref 0.60–0.93)
GFR, Est African American: 86 mL/min/{1.73_m2} (ref >=60)
GFR, Est Non African American: 75 mL/min/{1.73_m2} (ref >=60)
Globulin: 2.9 g/dL (calc) (ref 1.9–3.7)
Glucose, Bld: 89 mg/dL (ref 65–99)
Potassium: 4.1 mmol/L (ref 3.5–5.3)
Sodium: 142 mmol/L (ref 135–146)
Total Bilirubin: 0.8 mg/dL (ref 0.2–1.2)
Total Protein: 7 g/dL (ref 6.1–8.1)

## 2018-04-27 LAB — TSH: TSH: 0.49 mIU/L (ref 0.40–4.50)

## 2018-05-01 DIAGNOSIS — Z96651 Presence of right artificial knee joint: Secondary | ICD-10-CM | POA: Diagnosis not present

## 2018-05-01 DIAGNOSIS — N390 Urinary tract infection, site not specified: Secondary | ICD-10-CM | POA: Diagnosis not present

## 2018-05-01 DIAGNOSIS — Z471 Aftercare following joint replacement surgery: Secondary | ICD-10-CM | POA: Diagnosis not present

## 2018-05-04 ENCOUNTER — Telehealth: Payer: Self-pay | Admitting: Internal Medicine

## 2018-05-04 ENCOUNTER — Other Ambulatory Visit: Payer: Self-pay

## 2018-05-04 MED ORDER — DIAZEPAM 5 MG PO TABS: ORAL_TABLET | ORAL | 0 refills | Status: DC

## 2018-05-04 NOTE — Telephone Encounter (Signed)
Patient requesting a refill on her Valium.  Araceli will send refill for patient to be delivered to her.

## 2018-05-10 DIAGNOSIS — M205X9 Other deformities of toe(s) (acquired), unspecified foot: Secondary | ICD-10-CM | POA: Insufficient documentation

## 2018-05-10 DIAGNOSIS — M205X2 Other deformities of toe(s) (acquired), left foot: Secondary | ICD-10-CM | POA: Diagnosis not present

## 2018-05-10 DIAGNOSIS — M205X1 Other deformities of toe(s) (acquired), right foot: Secondary | ICD-10-CM | POA: Diagnosis not present

## 2018-05-10 DIAGNOSIS — B351 Tinea unguium: Secondary | ICD-10-CM | POA: Diagnosis not present

## 2018-05-20 NOTE — Patient Instructions (Signed)
Patient may want to consider seeing primary care physician and aspirate as it is difficult for her to travel here.  Her blood pressure is elevated but she took herself off of Demadex.  Blood pressure would likely improve if she took Demadex regularly but she has urinary frequency with it.  Episode of slurred speech is concerning for TIA.  I have recommended neurology referral.  He has Valium or meclizine to take for vertigo which is long-standing and recurrent.  Continues to exhibit grief reaction and anxiety.  Physical exam will be due in 6 months.

## 2018-05-28 DIAGNOSIS — H1045 Other chronic allergic conjunctivitis: Secondary | ICD-10-CM | POA: Diagnosis not present

## 2018-05-28 DIAGNOSIS — H1044 Vernal conjunctivitis: Secondary | ICD-10-CM | POA: Diagnosis not present

## 2018-05-29 DIAGNOSIS — S8002XA Contusion of left knee, initial encounter: Secondary | ICD-10-CM | POA: Diagnosis not present

## 2018-05-29 DIAGNOSIS — Z9181 History of falling: Secondary | ICD-10-CM | POA: Diagnosis not present

## 2018-05-29 DIAGNOSIS — Z96651 Presence of right artificial knee joint: Secondary | ICD-10-CM | POA: Diagnosis not present

## 2018-05-29 DIAGNOSIS — S8001XA Contusion of right knee, initial encounter: Secondary | ICD-10-CM | POA: Diagnosis not present

## 2018-05-29 DIAGNOSIS — Z96653 Presence of artificial knee joint, bilateral: Secondary | ICD-10-CM | POA: Diagnosis not present

## 2018-05-29 DIAGNOSIS — Z96652 Presence of left artificial knee joint: Secondary | ICD-10-CM | POA: Diagnosis not present

## 2018-06-12 DIAGNOSIS — M205X2 Other deformities of toe(s) (acquired), left foot: Secondary | ICD-10-CM | POA: Diagnosis not present

## 2018-06-12 DIAGNOSIS — N3289 Other specified disorders of bladder: Secondary | ICD-10-CM | POA: Diagnosis not present

## 2018-06-12 DIAGNOSIS — M205X1 Other deformities of toe(s) (acquired), right foot: Secondary | ICD-10-CM | POA: Diagnosis not present

## 2018-06-12 DIAGNOSIS — N390 Urinary tract infection, site not specified: Secondary | ICD-10-CM | POA: Diagnosis not present

## 2018-06-14 DIAGNOSIS — R200 Anesthesia of skin: Secondary | ICD-10-CM | POA: Diagnosis not present

## 2018-06-14 DIAGNOSIS — E785 Hyperlipidemia, unspecified: Secondary | ICD-10-CM | POA: Diagnosis not present

## 2018-06-14 DIAGNOSIS — Z79899 Other long term (current) drug therapy: Secondary | ICD-10-CM | POA: Diagnosis not present

## 2018-06-14 DIAGNOSIS — G8194 Hemiplegia, unspecified affecting left nondominant side: Secondary | ICD-10-CM | POA: Diagnosis not present

## 2018-06-14 DIAGNOSIS — Z7982 Long term (current) use of aspirin: Secondary | ICD-10-CM | POA: Diagnosis not present

## 2018-06-14 DIAGNOSIS — E875 Hyperkalemia: Secondary | ICD-10-CM | POA: Diagnosis not present

## 2018-06-14 DIAGNOSIS — R531 Weakness: Secondary | ICD-10-CM | POA: Diagnosis not present

## 2018-06-14 DIAGNOSIS — Z888 Allergy status to other drugs, medicaments and biological substances status: Secondary | ICD-10-CM | POA: Diagnosis not present

## 2018-06-14 DIAGNOSIS — R29818 Other symptoms and signs involving the nervous system: Secondary | ICD-10-CM | POA: Diagnosis not present

## 2018-06-14 DIAGNOSIS — G459 Transient cerebral ischemic attack, unspecified: Secondary | ICD-10-CM | POA: Diagnosis not present

## 2018-06-14 DIAGNOSIS — I10 Essential (primary) hypertension: Secondary | ICD-10-CM | POA: Diagnosis not present

## 2018-06-15 DIAGNOSIS — G459 Transient cerebral ischemic attack, unspecified: Secondary | ICD-10-CM | POA: Diagnosis not present

## 2018-06-15 DIAGNOSIS — G8194 Hemiplegia, unspecified affecting left nondominant side: Secondary | ICD-10-CM | POA: Diagnosis not present

## 2018-06-15 DIAGNOSIS — E875 Hyperkalemia: Secondary | ICD-10-CM | POA: Diagnosis not present

## 2018-06-15 DIAGNOSIS — I6789 Other cerebrovascular disease: Secondary | ICD-10-CM | POA: Diagnosis not present

## 2018-06-15 DIAGNOSIS — I10 Essential (primary) hypertension: Secondary | ICD-10-CM | POA: Diagnosis not present

## 2018-06-15 DIAGNOSIS — R200 Anesthesia of skin: Secondary | ICD-10-CM | POA: Diagnosis not present

## 2018-06-15 DIAGNOSIS — I6523 Occlusion and stenosis of bilateral carotid arteries: Secondary | ICD-10-CM | POA: Diagnosis not present

## 2018-06-26 ENCOUNTER — Encounter: Payer: Self-pay | Admitting: Internal Medicine

## 2018-06-26 ENCOUNTER — Ambulatory Visit (INDEPENDENT_AMBULATORY_CARE_PROVIDER_SITE_OTHER): Payer: Medicare Other | Admitting: Internal Medicine

## 2018-06-26 VITALS — BP 130/70 | HR 76 | Temp 98.1°F | Ht 63.0 in | Wt 180.0 lb

## 2018-06-26 DIAGNOSIS — F411 Generalized anxiety disorder: Secondary | ICD-10-CM | POA: Diagnosis not present

## 2018-06-26 DIAGNOSIS — E785 Hyperlipidemia, unspecified: Secondary | ICD-10-CM

## 2018-06-26 DIAGNOSIS — I10 Essential (primary) hypertension: Secondary | ICD-10-CM | POA: Diagnosis not present

## 2018-06-26 DIAGNOSIS — G459 Transient cerebral ischemic attack, unspecified: Secondary | ICD-10-CM

## 2018-06-26 DIAGNOSIS — F4321 Adjustment disorder with depressed mood: Secondary | ICD-10-CM

## 2018-06-26 DIAGNOSIS — E039 Hypothyroidism, unspecified: Secondary | ICD-10-CM

## 2018-06-26 MED ORDER — DIAZEPAM 5 MG PO TABS: ORAL_TABLET | ORAL | 1 refills | Status: DC

## 2018-06-26 NOTE — Progress Notes (Signed)
   Subjective:    Patient ID: Tara [name], female    DOB: [birth year], 82 y.o.   MRN: [id number]  HPI 82 year old Female recently hospitalized at [facility] with TIA.  Has upcoming neurology appointment in the near future.  History of hypertension.  Presented to the emergency department July 26 in [place] with left arm tingling and numbness with weakness.  Apparently she was holding a plate which she had to drop secondary to her arm weakness.  Felt numbness all the way up to her left jaw.  Although she denied previous episodes she had told me of an episode of dizziness in June which was concerning and also an episode of slurred speech for some 15 minutes around May 27.  She has Valium on hand for anxiety.  She lost her husband to complications of cancer and heart disease several months ago and is still grieving.  She resides alone.  She had a total knee replacement in February of this year.  With this recent episode at [facility] she was hospitalized overnight.  Her vital signs were stable.  Motor toe discharge weakness and numbness had resolved.  Carotid duplex study was unremarkable.  2D echocardiogram was normal.  Statin medication was added due to LDL of 100.2 and history of TIA.  No change in antihypertensive medications.  No significant ICA stenosis on carotid ultrasound.  MRI/MRA of the brain showed no acute intracranial abnormality but chronic right frontoparietal microhemorrhages.  CBC was normal.  Pro time INR was normal.  Electrolytes were normal.  She had mild elevation of SGOT at 38.  Troponin was negative.      Review of Systems see above     Objective:   Physical Exam She really is extremely anxious.  She is somewhat tearful in the office.  She thinks someone hacked her bank account and spent about 10 minutes talking about that.  She has no focal deficits on brief neurological exam.  Chest clear.  Cardiac exam regular rate and rhythm.  Extremities without  edema.       Assessment & Plan:  Status post TIA affecting left upper extremity to see neurologist in the near future.  Now on statin medication.  Essential hypertension-continue same medication  Anxiety-Valium refilled which she takes very sparingly  Grief reaction  Morbid obesity  History of breast cancer  Hypothyroidism  Osteoarthritis of left knee  Status post total knee replacement right knee  History of recurrent urinary infections  Plan: To see neurologist in the near future.  Physical exam and Medicare wellness check due late November early December.  Asked patient to take Valium for anxiety

## 2018-06-27 ENCOUNTER — Encounter: Payer: Self-pay | Admitting: *Deleted

## 2018-07-02 ENCOUNTER — Ambulatory Visit (INDEPENDENT_AMBULATORY_CARE_PROVIDER_SITE_OTHER): Payer: Medicare Other | Admitting: Neurology

## 2018-07-02 ENCOUNTER — Encounter: Payer: Self-pay | Admitting: Neurology

## 2018-07-02 VITALS — BP 132/77 | HR 68 | Ht 63.0 in | Wt 184.5 lb

## 2018-07-02 DIAGNOSIS — R51 Headache: Secondary | ICD-10-CM | POA: Diagnosis not present

## 2018-07-02 DIAGNOSIS — G459 Transient cerebral ischemic attack, unspecified: Secondary | ICD-10-CM | POA: Diagnosis not present

## 2018-07-02 DIAGNOSIS — R519 Headache, unspecified: Secondary | ICD-10-CM | POA: Insufficient documentation
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
## 2018-07-03 DIAGNOSIS — N390 Urinary tract infection, site not specified: Secondary | ICD-10-CM | POA: Diagnosis not present

## 2018-07-09 ENCOUNTER — Telehealth: Payer: Self-pay | Admitting: Internal Medicine

## 2018-07-09 MED ORDER — ATORVASTATIN CALCIUM 40 MG PO TABS: 40.0000 mg | ORAL_TABLET | Freq: Every day | ORAL | 0 refills | Status: DC

## 2018-07-09 NOTE — Telephone Encounter (Signed)
Was recently seen for hospital follow up.  She needs a refill on her Lipitor 40 mg.  She would like a 90 day supply from her mail order pharmacy please.  And, I would go ahead and give her some refills please.    Pharmacy:  [facility]  Thank you.

## 2018-07-10 DIAGNOSIS — H1089 Other conjunctivitis: Secondary | ICD-10-CM | POA: Diagnosis not present

## 2018-07-17 NOTE — Patient Instructions (Addendum)
To see Neurologist in the near future.Due for CPE late November.

## 2018-07-19 DIAGNOSIS — N390 Urinary tract infection, site not specified: Secondary | ICD-10-CM | POA: Diagnosis not present

## 2018-07-26 ENCOUNTER — Encounter: Payer: Self-pay | Admitting: *Deleted

## 2018-07-26 ENCOUNTER — Telehealth: Payer: Self-pay | Admitting: Neurology

## 2018-07-26 NOTE — Telephone Encounter (Addendum)
Spoke to patient - she developed mild, blurred vision in her right eye over the last 24 hours.  She denied any of the following symptoms:  Headache, numbness, weakness, slurred speech, difficulty speaking or confusion.  Her only symptoms is her mildly blurred vision in the right eye.  She tells me she has been very stressed over her best friend of 32 years passing away.  She has continued taking aspirin 325mg  daily along with her Lipitor 40mg  daily.  She stays well hydrated and exercises daily by riding her stationary bike.  She eats healthy.  I have reviewed this information with Dr. Krista Blue.  Patient is likely stressed but should monitor her symptoms closely.  If she develops any stroke-like symptoms, she was instructed to proceed to the ED.  States her husband had two strokes and she is well educated on the signs/symptoms.  We also reviewed them on the phone today.  She will continue her stroke prevention program of medications, hydration, diet and exercise.    She will call us back with any further concerns.

## 2018-07-26 NOTE — Telephone Encounter (Signed)
Pt is not seeing as well in the right eye that started yesterday and today. She is concerned it could be related to the TIA. Please call to advise

## 2018-07-31 ENCOUNTER — Encounter

## 2018-07-31 ENCOUNTER — Ambulatory Visit: Payer: Medicare Other | Admitting: Neurology

## 2018-08-20 DIAGNOSIS — N390 Urinary tract infection, site not specified: Secondary | ICD-10-CM | POA: Diagnosis not present

## 2018-09-10 ENCOUNTER — Other Ambulatory Visit: Payer: Self-pay | Admitting: Internal Medicine

## 2018-10-01 ENCOUNTER — Telehealth: Payer: Self-pay | Admitting: Neurology

## 2018-10-01 NOTE — Telephone Encounter (Signed)
Pt had been dizzy in head for almost a week, since 11-5, pt is asking for a call to discuss

## 2018-10-01 NOTE — Telephone Encounter (Signed)
Spoke to patient - she has spinning dizziness but states she has suffered with intermittent vertigo since 1970's.  She told me that vesitbular rehab has not been helpful in the past.  She reports getting relief previously with acupuncture.  Says she is under the care of her PCP and was directed to try her Valium.  So far, the medication has not been helpful.  She plans to reach back out to her PCP if the dizziness continues.

## 2018-10-15 DIAGNOSIS — N3289 Other specified disorders of bladder: Secondary | ICD-10-CM | POA: Diagnosis not present

## 2018-10-15 DIAGNOSIS — N390 Urinary tract infection, site not specified: Secondary | ICD-10-CM | POA: Diagnosis not present

## 2018-10-16 ENCOUNTER — Other Ambulatory Visit: Payer: Self-pay | Admitting: Internal Medicine

## 2018-11-07 DIAGNOSIS — N390 Urinary tract infection, site not specified: Secondary | ICD-10-CM | POA: Diagnosis not present

## 2018-11-20 DIAGNOSIS — Z23 Encounter for immunization: Secondary | ICD-10-CM | POA: Diagnosis not present

## 2018-11-22 ENCOUNTER — Telehealth: Payer: Self-pay | Admitting: Internal Medicine

## 2018-11-22 NOTE — Telephone Encounter (Signed)
Patient called today requesting a refill on Valium be sent to [facility] in [place].    Pharmacy:  [facility]  Thank you.

## 2018-11-23 MED ORDER — DIAZEPAM 5 MG PO TABS: ORAL_TABLET | ORAL | 5 refills | Status: DC

## 2018-11-23 NOTE — Telephone Encounter (Signed)
This has been done but she is past due for Medicare CPE I think

## 2018-12-19 ENCOUNTER — Other Ambulatory Visit: Payer: Self-pay | Admitting: Internal Medicine

## 2019-01-07 ENCOUNTER — Telehealth: Payer: Self-pay | Admitting: Internal Medicine

## 2019-01-07 NOTE — Telephone Encounter (Signed)
Patient said she will call back to set up appt. When she figures out what her schedule is

## 2019-01-07 NOTE — Telephone Encounter (Signed)
Schedule an appt.

## 2019-01-07 NOTE — Telephone Encounter (Signed)
Patient wants to know if Dr. Renold Genta will call her in a prescription for dizziness. Patient said Areta Haber is not working. Patient would like a call back at [phone]

## 2019-01-15 ENCOUNTER — Telehealth: Payer: Self-pay | Admitting: Internal Medicine

## 2019-01-15 NOTE — Telephone Encounter (Signed)
Patient called and said she had talked to [facility] about sending records some where and I told that I was not sure what had been done but I would leave a message for Sharyn Lull so she could check. Patient said she would like a call back

## 2019-01-18 DIAGNOSIS — R42 Dizziness and giddiness: Secondary | ICD-10-CM | POA: Diagnosis not present

## 2019-01-18 DIAGNOSIS — Z6832 Body mass index (BMI) 32.0-32.9, adult: Secondary | ICD-10-CM | POA: Diagnosis not present

## 2019-01-28 NOTE — Telephone Encounter (Signed)
Medical Records has been faxed to [facility] - Dr Renold Genta removed as PCP  Called patient to let her know.

## 2019-01-29 DIAGNOSIS — Z96651 Presence of right artificial knee joint: Secondary | ICD-10-CM | POA: Diagnosis not present

## 2019-01-29 DIAGNOSIS — Z96652 Presence of left artificial knee joint: Secondary | ICD-10-CM | POA: Diagnosis not present

## 2019-01-29 DIAGNOSIS — M25562 Pain in left knee: Secondary | ICD-10-CM | POA: Diagnosis not present

## 2019-01-29 DIAGNOSIS — M25561 Pain in right knee: Secondary | ICD-10-CM | POA: Diagnosis not present

## 2019-03-06 DIAGNOSIS — N390 Urinary tract infection, site not specified: Secondary | ICD-10-CM | POA: Diagnosis not present

## 2019-05-07 DIAGNOSIS — M5431 Sciatica, right side: Secondary | ICD-10-CM | POA: Diagnosis not present

## 2019-05-07 DIAGNOSIS — S8991XA Unspecified injury of right lower leg, initial encounter: Secondary | ICD-10-CM | POA: Diagnosis not present

## 2019-05-07 DIAGNOSIS — I10 Essential (primary) hypertension: Secondary | ICD-10-CM | POA: Diagnosis not present

## 2019-05-07 DIAGNOSIS — Z7982 Long term (current) use of aspirin: Secondary | ICD-10-CM | POA: Diagnosis not present

## 2019-05-07 DIAGNOSIS — Z8673 Personal history of transient ischemic attack (TIA), and cerebral infarction without residual deficits: Secondary | ICD-10-CM | POA: Diagnosis not present

## 2019-05-07 DIAGNOSIS — Z79899 Other long term (current) drug therapy: Secondary | ICD-10-CM | POA: Diagnosis not present

## 2019-05-08 DIAGNOSIS — M5431 Sciatica, right side: Secondary | ICD-10-CM | POA: Diagnosis not present

## 2019-05-08 DIAGNOSIS — Z6832 Body mass index (BMI) 32.0-32.9, adult: Secondary | ICD-10-CM | POA: Diagnosis not present

## 2019-05-08 DIAGNOSIS — I10 Essential (primary) hypertension: Secondary | ICD-10-CM | POA: Diagnosis not present

## 2019-05-13 DIAGNOSIS — M25551 Pain in right hip: Secondary | ICD-10-CM | POA: Diagnosis not present

## 2019-05-13 DIAGNOSIS — M5430 Sciatica, unspecified side: Secondary | ICD-10-CM | POA: Diagnosis not present

## 2019-05-13 DIAGNOSIS — M25552 Pain in left hip: Secondary | ICD-10-CM | POA: Diagnosis not present

## 2019-05-13 DIAGNOSIS — Z6832 Body mass index (BMI) 32.0-32.9, adult: Secondary | ICD-10-CM | POA: Diagnosis not present

## 2019-05-16 ENCOUNTER — Other Ambulatory Visit: Payer: Self-pay | Admitting: Student

## 2019-05-16 DIAGNOSIS — M21372 Foot drop, left foot: Secondary | ICD-10-CM

## 2019-05-20 ENCOUNTER — Other Ambulatory Visit: Payer: Self-pay | Admitting: Neurosurgery

## 2019-05-20 ENCOUNTER — Ambulatory Visit
Admission: RE | Admit: 2019-05-20 | Discharge: 2019-05-20 | Disposition: A | Discharge status: Home / Self Care | Payer: Medicare Other | Source: Ambulatory Visit | Attending: Student | Admitting: Student

## 2019-05-20 DIAGNOSIS — M48061 Spinal stenosis, lumbar region without neurogenic claudication: Secondary | ICD-10-CM | POA: Diagnosis not present

## 2019-05-20 DIAGNOSIS — M5127 Other intervertebral disc displacement, lumbosacral region: Secondary | ICD-10-CM | POA: Diagnosis not present

## 2019-05-20 DIAGNOSIS — M21372 Foot drop, left foot: Secondary | ICD-10-CM

## 2019-05-20 DIAGNOSIS — M7138 Other bursal cyst, other site: Secondary | ICD-10-CM | POA: Diagnosis not present

## 2019-05-20 DIAGNOSIS — M4316 Spondylolisthesis, lumbar region: Secondary | ICD-10-CM | POA: Diagnosis not present

## 2019-05-20 DIAGNOSIS — M5126 Other intervertebral disc displacement, lumbar region: Secondary | ICD-10-CM | POA: Diagnosis not present

## 2019-05-20 DIAGNOSIS — M4696 Unspecified inflammatory spondylopathy, lumbar region: Secondary | ICD-10-CM | POA: Diagnosis not present

## 2019-05-20 MED ORDER — GADOBENATE DIMEGLUMINE 529 MG/ML IV SOLN
15.0000 mL | Freq: Once | INTRAVENOUS | Status: AC | PRN
  Administered 2019-05-20: 15 mL via INTRAVENOUS

## 2019-05-21 ENCOUNTER — Other Ambulatory Visit (HOSPITAL_COMMUNITY)
Admission: RE | Admit: 2019-05-21 | Discharge: 2019-05-21 | Disposition: A | Discharge status: Home / Self Care | Payer: Medicare Other | Source: Ambulatory Visit | Attending: Neurosurgery | Admitting: Neurosurgery

## 2019-05-21 DIAGNOSIS — Z1159 Encounter for screening for other viral diseases: Secondary | ICD-10-CM | POA: Insufficient documentation

## 2019-05-21 LAB — SARS CORONAVIRUS 2 (TAT 6-24 HRS): SARS Coronavirus 2: NEGATIVE

## 2019-05-22 ENCOUNTER — Other Ambulatory Visit: Payer: Self-pay

## 2019-05-22 ENCOUNTER — Encounter (HOSPITAL_COMMUNITY): Payer: Self-pay | Admitting: *Deleted

## 2019-05-22 NOTE — Progress Notes (Signed)
Pt denies SOB, chest pain, and being under the care of a cardiologist. Pt denies having a cardiac cath and stated that a stress test was performed > 10 years ago. Pt stated that she was instructed to stop taking Aspirin " I stopped about 3-4 days ago."  Pt made aware to stop Fruit Full Antioxidant Extract and Cruciferous Extract, vitamins, fish oil and herbal medications. Do not take any NSAIDs ie: Ibuprofen, Advil, Naproxen (Aleve), Motrin, BC and Goody Powder. Pt denies that she had a chest x ray within the last year. Pt denies recent labs. Pt denies that she and family members tested positive for COVID-19 ( pt reminded to quarantine).   Pt denies that she and family experienced the following symptoms:  Cough yes/no: No Fever (>100.12F)  yes/no: No Runny nose yes/no: No Sore throat yes/no: No Difficulty breathing/shortness of breath  yes/no: No  Have you or a family member traveled in the last 14 days and where? yes/no: No  Pt reminded that hospital visitation restrictions are in effect and the importance of the restrictions.   Pt verbalized understanding of all pre-op instructions.

## 2019-05-23 ENCOUNTER — Inpatient Hospital Stay (HOSPITAL_COMMUNITY): Payer: Medicare Other

## 2019-05-23 ENCOUNTER — Encounter (HOSPITAL_COMMUNITY): Payer: Self-pay | Admitting: Surgery

## 2019-05-23 ENCOUNTER — Inpatient Hospital Stay (HOSPITAL_COMMUNITY): Payer: Medicare Other | Admitting: Certified Registered Nurse Anesthetist

## 2019-05-23 ENCOUNTER — Encounter (HOSPITAL_COMMUNITY)
Admission: RE | Disposition: A | Discharge status: Rehabilitation Facility | Payer: Self-pay | Source: Home / Self Care | Attending: Neurosurgery

## 2019-05-23 ENCOUNTER — Inpatient Hospital Stay (HOSPITAL_COMMUNITY)
Admission: RE | Admit: 2019-05-23 | Discharge: 2019-05-28 | DRG: 455 | Disposition: A | Discharge status: Rehabilitation Facility | Payer: Medicare Other | Attending: Neurosurgery | Admitting: Neurosurgery

## 2019-05-23 DIAGNOSIS — Z8049 Family history of malignant neoplasm of other genital organs: Secondary | ICD-10-CM | POA: Diagnosis not present

## 2019-05-23 DIAGNOSIS — Z96653 Presence of artificial knee joint, bilateral: Secondary | ICD-10-CM | POA: Diagnosis present

## 2019-05-23 DIAGNOSIS — M549 Dorsalgia, unspecified: Secondary | ICD-10-CM | POA: Diagnosis not present

## 2019-05-23 DIAGNOSIS — K5903 Drug induced constipation: Secondary | ICD-10-CM

## 2019-05-23 DIAGNOSIS — M48061 Spinal stenosis, lumbar region without neurogenic claudication: Secondary | ICD-10-CM | POA: Diagnosis present

## 2019-05-23 DIAGNOSIS — Z419 Encounter for procedure for purposes other than remedying health state, unspecified: Secondary | ICD-10-CM

## 2019-05-23 DIAGNOSIS — R42 Dizziness and giddiness: Secondary | ICD-10-CM | POA: Diagnosis not present

## 2019-05-23 DIAGNOSIS — Z823 Family history of stroke: Secondary | ICD-10-CM

## 2019-05-23 DIAGNOSIS — I82441 Acute embolism and thrombosis of right tibial vein: Secondary | ICD-10-CM | POA: Diagnosis present

## 2019-05-23 DIAGNOSIS — M5416 Radiculopathy, lumbar region: Secondary | ICD-10-CM | POA: Diagnosis not present

## 2019-05-23 DIAGNOSIS — I82452 Acute embolism and thrombosis of left peroneal vein: Secondary | ICD-10-CM | POA: Diagnosis present

## 2019-05-23 DIAGNOSIS — G8918 Other acute postprocedural pain: Secondary | ICD-10-CM

## 2019-05-23 DIAGNOSIS — Z79899 Other long term (current) drug therapy: Secondary | ICD-10-CM | POA: Diagnosis not present

## 2019-05-23 DIAGNOSIS — Z96611 Presence of right artificial shoulder joint: Secondary | ICD-10-CM | POA: Diagnosis present

## 2019-05-23 DIAGNOSIS — M48062 Spinal stenosis, lumbar region with neurogenic claudication: Secondary | ICD-10-CM | POA: Diagnosis not present

## 2019-05-23 DIAGNOSIS — Z881 Allergy status to other antibiotic agents status: Secondary | ICD-10-CM | POA: Diagnosis not present

## 2019-05-23 DIAGNOSIS — M21379 Foot drop, unspecified foot: Secondary | ICD-10-CM | POA: Diagnosis present

## 2019-05-23 DIAGNOSIS — Z803 Family history of malignant neoplasm of breast: Secondary | ICD-10-CM

## 2019-05-23 DIAGNOSIS — K567 Ileus, unspecified: Secondary | ICD-10-CM | POA: Diagnosis present

## 2019-05-23 DIAGNOSIS — Z79890 Hormone replacement therapy (postmenopausal): Secondary | ICD-10-CM | POA: Diagnosis not present

## 2019-05-23 DIAGNOSIS — E669 Obesity, unspecified: Secondary | ICD-10-CM | POA: Diagnosis present

## 2019-05-23 DIAGNOSIS — Z6828 Body mass index (BMI) 28.0-28.9, adult: Secondary | ICD-10-CM | POA: Diagnosis not present

## 2019-05-23 DIAGNOSIS — M5116 Intervertebral disc disorders with radiculopathy, lumbar region: Secondary | ICD-10-CM | POA: Diagnosis present

## 2019-05-23 DIAGNOSIS — Z8249 Family history of ischemic heart disease and other diseases of the circulatory system: Secondary | ICD-10-CM

## 2019-05-23 DIAGNOSIS — K5900 Constipation, unspecified: Secondary | ICD-10-CM | POA: Diagnosis present

## 2019-05-23 DIAGNOSIS — F411 Generalized anxiety disorder: Secondary | ICD-10-CM | POA: Diagnosis not present

## 2019-05-23 DIAGNOSIS — M5126 Other intervertebral disc displacement, lumbar region: Secondary | ICD-10-CM | POA: Diagnosis present

## 2019-05-23 DIAGNOSIS — I10 Essential (primary) hypertension: Secondary | ICD-10-CM | POA: Diagnosis not present

## 2019-05-23 DIAGNOSIS — Z981 Arthrodesis status: Secondary | ICD-10-CM | POA: Diagnosis not present

## 2019-05-23 DIAGNOSIS — Z7982 Long term (current) use of aspirin: Secondary | ICD-10-CM | POA: Diagnosis not present

## 2019-05-23 DIAGNOSIS — Z8673 Personal history of transient ischemic attack (TIA), and cerebral infarction without residual deficits: Secondary | ICD-10-CM | POA: Diagnosis not present

## 2019-05-23 DIAGNOSIS — Z888 Allergy status to other drugs, medicaments and biological substances status: Secondary | ICD-10-CM | POA: Diagnosis not present

## 2019-05-23 DIAGNOSIS — I82431 Acute embolism and thrombosis of right popliteal vein: Secondary | ICD-10-CM | POA: Diagnosis present

## 2019-05-23 DIAGNOSIS — R7303 Prediabetes: Secondary | ICD-10-CM | POA: Diagnosis not present

## 2019-05-23 DIAGNOSIS — G894 Chronic pain syndrome: Secondary | ICD-10-CM | POA: Diagnosis not present

## 2019-05-23 DIAGNOSIS — Z853 Personal history of malignant neoplasm of breast: Secondary | ICD-10-CM | POA: Diagnosis not present

## 2019-05-23 DIAGNOSIS — G8220 Paraplegia, unspecified: Secondary | ICD-10-CM | POA: Diagnosis not present

## 2019-05-23 DIAGNOSIS — E039 Hypothyroidism, unspecified: Secondary | ICD-10-CM | POA: Diagnosis present

## 2019-05-23 DIAGNOSIS — Z6831 Body mass index (BMI) 31.0-31.9, adult: Secondary | ICD-10-CM | POA: Diagnosis not present

## 2019-05-23 DIAGNOSIS — Z4789 Encounter for other orthopedic aftercare: Secondary | ICD-10-CM | POA: Diagnosis present

## 2019-05-23 DIAGNOSIS — M7989 Other specified soft tissue disorders: Secondary | ICD-10-CM | POA: Diagnosis not present

## 2019-05-23 DIAGNOSIS — N390 Urinary tract infection, site not specified: Secondary | ICD-10-CM | POA: Diagnosis not present

## 2019-05-23 DIAGNOSIS — E785 Hyperlipidemia, unspecified: Secondary | ICD-10-CM | POA: Diagnosis present

## 2019-05-23 DIAGNOSIS — K9189 Other postprocedural complications and disorders of digestive system: Secondary | ICD-10-CM | POA: Diagnosis not present

## 2019-05-23 DIAGNOSIS — M532X6 Spinal instabilities, lumbar region: Secondary | ICD-10-CM | POA: Diagnosis not present

## 2019-05-23 DIAGNOSIS — D62 Acute posthemorrhagic anemia: Secondary | ICD-10-CM | POA: Diagnosis not present

## 2019-05-23 DIAGNOSIS — Z79891 Long term (current) use of opiate analgesic: Secondary | ICD-10-CM | POA: Diagnosis not present

## 2019-05-23 HISTORY — DX: Dizziness and giddiness: R42

## 2019-05-23 HISTORY — DX: Urinary tract infection, site not specified: N39.0

## 2019-05-23 HISTORY — DX: Presence of spectacles and contact lenses: Z97.3

## 2019-05-23 HISTORY — DX: Other intervertebral disc displacement, lumbar region: M51.26

## 2019-05-23 HISTORY — DX: Cerebral infarction, unspecified: I63.9

## 2019-05-23 LAB — TYPE AND SCREEN
ABO/RH(D): O POS
Antibody Screen: NEGATIVE

## 2019-05-23 LAB — BASIC METABOLIC PANEL
Anion gap: 10 (ref 5–15)
BUN: 13 mg/dL (ref 8–23)
CO2: 30 mmol/L (ref 22–32)
Calcium: 9 mg/dL (ref 8.9–10.3)
Chloride: 102 mmol/L (ref 98–111)
Creatinine, Ser: 0.72 mg/dL (ref 0.44–1.00)
GFR calc Af Amer: >60 mL/min (ref >60)
GFR calc non Af Amer: >60 mL/min (ref >60)
Glucose, Bld: 111 mg/dL — ABNORMAL HIGH (ref 70–99)
Potassium: 3.2 mmol/L — ABNORMAL LOW (ref 3.5–5.1)
Sodium: 142 mmol/L (ref 135–145)

## 2019-05-23 LAB — CBC
HCT: 42.1 % (ref 36.0–46.0)
Hemoglobin: 14.2 g/dL (ref 12.0–15.0)
MCH: 32.6 pg (ref 26.0–34.0)
MCHC: 33.7 g/dL (ref 30.0–36.0)
MCV: 96.8 fL (ref 80.0–100.0)
Platelets: 222 10*3/uL (ref 150–400)
RBC: 4.35 MIL/uL (ref 3.87–5.11)
RDW: 12.8 % (ref 11.5–15.5)
WBC: 11.6 10*3/uL — ABNORMAL HIGH (ref 4.0–10.5)
nRBC: 0 % (ref 0.0–0.2)

## 2019-05-23 SURGERY — POSTERIOR LUMBAR FUSION 1 LEVEL
Anesthesia: General | Site: Back

## 2019-05-23 MED ORDER — DIAZEPAM 5 MG PO TABS: 5.0000 mg | ORAL_TABLET | Freq: Two times a day (BID) | ORAL | Status: DC | PRN

## 2019-05-23 MED ORDER — SODIUM CHLORIDE 0.9% FLUSH: 3.0000 mL | INTRAVENOUS | Status: DC | PRN

## 2019-05-23 MED ORDER — ACETAMINOPHEN 650 MG RE SUPP: 650.0000 mg | RECTAL | Status: DC | PRN

## 2019-05-23 MED ORDER — LEVOTHYROXINE SODIUM 75 MCG PO TABS
75.0000 ug | ORAL_TABLET | Freq: Every day | ORAL | Status: DC
  Administered 2019-05-24 – 2019-05-28 (×5): 75 ug via ORAL
  Filled 2019-05-23 (×5): qty 1

## 2019-05-23 MED ORDER — LIDOCAINE HCL (CARDIAC) PF 100 MG/5ML IV SOSY
PREFILLED_SYRINGE | INTRAVENOUS | Status: DC | PRN
  Administered 2019-05-23: 60 mg via INTRAVENOUS

## 2019-05-23 MED ORDER — ASPIRIN 325 MG PO TABS
325.0000 mg | ORAL_TABLET | Freq: Every day | ORAL | Status: DC
  Administered 2019-05-23 – 2019-05-28 (×6): 325 mg via ORAL
  Filled 2019-05-23 (×6): qty 1

## 2019-05-23 MED ORDER — HYDROMORPHONE HCL 1 MG/ML IJ SOLN
0.2500 mg | INTRAMUSCULAR | Status: DC | PRN
  Administered 2019-05-23 (×2): 0.25 mg via INTRAVENOUS
  Administered 2019-05-23: 0.5 mg via INTRAVENOUS

## 2019-05-23 MED ORDER — ATORVASTATIN CALCIUM 40 MG PO TABS
40.0000 mg | ORAL_TABLET | Freq: Every day | ORAL | Status: DC
  Administered 2019-05-23 – 2019-05-28 (×6): 40 mg via ORAL
  Filled 2019-05-23 (×6): qty 1

## 2019-05-23 MED ORDER — CHLORHEXIDINE GLUCONATE CLOTH 2 % EX PADS: 6.0000 | MEDICATED_PAD | Freq: Once | CUTANEOUS | Status: DC

## 2019-05-23 MED ORDER — PROPOFOL 10 MG/ML IV BOLUS
INTRAVENOUS | Status: AC
  Filled 2019-05-23: qty 20

## 2019-05-23 MED ORDER — PHENYLEPHRINE HCL (PRESSORS) 10 MG/ML IV SOLN
INTRAVENOUS | Status: DC | PRN
  Administered 2019-05-23: 60 ug via INTRAVENOUS

## 2019-05-23 MED ORDER — DEXAMETHASONE SODIUM PHOSPHATE 4 MG/ML IJ SOLN
INTRAMUSCULAR | Status: DC | PRN
  Administered 2019-05-23: 5 mg via INTRAVENOUS

## 2019-05-23 MED ORDER — CEFAZOLIN SODIUM-DEXTROSE 2-3 GM-%(50ML) IV SOLR
INTRAVENOUS | Status: DC | PRN
  Administered 2019-05-23: 2 g via INTRAVENOUS

## 2019-05-23 MED ORDER — LIDOCAINE-EPINEPHRINE 1 %-1:100000 IJ SOLN
INTRAMUSCULAR | Status: AC
  Filled 2019-05-23: qty 1

## 2019-05-23 MED ORDER — CYCLOBENZAPRINE HCL 10 MG PO TABS
10.0000 mg | ORAL_TABLET | Freq: Three times a day (TID) | ORAL | Status: DC | PRN
  Administered 2019-05-24 – 2019-05-28 (×2): 10 mg via ORAL
  Filled 2019-05-23 (×3): qty 1

## 2019-05-23 MED ORDER — OXYCODONE HCL 5 MG PO TABS: 5.0000 mg | ORAL_TABLET | Freq: Once | ORAL | Status: DC | PRN

## 2019-05-23 MED ORDER — FENTANYL CITRATE (PF) 100 MCG/2ML IJ SOLN
INTRAMUSCULAR | Status: DC | PRN
  Administered 2019-05-23: 100 ug via INTRAVENOUS
  Administered 2019-05-23 (×2): 50 ug via INTRAVENOUS

## 2019-05-23 MED ORDER — ACETAMINOPHEN 10 MG/ML IV SOLN
INTRAVENOUS | Status: AC
  Filled 2019-05-23: qty 100

## 2019-05-23 MED ORDER — BUPIVACAINE HCL (PF) 0.25 % IJ SOLN
INTRAMUSCULAR | Status: AC
  Filled 2019-05-23: qty 30

## 2019-05-23 MED ORDER — SUGAMMADEX SODIUM 200 MG/2ML IV SOLN
INTRAVENOUS | Status: DC | PRN
  Administered 2019-05-23: 200 mg via INTRAVENOUS

## 2019-05-23 MED ORDER — LOSARTAN POTASSIUM 50 MG PO TABS
50.0000 mg | ORAL_TABLET | Freq: Every day | ORAL | Status: DC
  Administered 2019-05-23 – 2019-05-28 (×6): 50 mg via ORAL
  Filled 2019-05-23 (×6): qty 1

## 2019-05-23 MED ORDER — OXYCODONE HCL 5 MG PO TABS
10.0000 mg | ORAL_TABLET | ORAL | Status: DC | PRN
  Administered 2019-05-24 – 2019-05-28 (×10): 10 mg via ORAL
  Filled 2019-05-23 (×12): qty 2

## 2019-05-23 MED ORDER — AMLODIPINE BESYLATE 5 MG PO TABS
5.0000 mg | ORAL_TABLET | Freq: Every day | ORAL | Status: DC
  Administered 2019-05-24 – 2019-05-28 (×5): 5 mg via ORAL
  Filled 2019-05-23 (×5): qty 1

## 2019-05-23 MED ORDER — PHENOL 1.4 % MT LIQD: 1.0000 | OROMUCOSAL | Status: DC | PRN

## 2019-05-23 MED ORDER — EPHEDRINE SULFATE 50 MG/ML IJ SOLN
INTRAMUSCULAR | Status: DC | PRN
  Administered 2019-05-23: 5 mg via INTRAVENOUS

## 2019-05-23 MED ORDER — BUPIVACAINE LIPOSOME 1.3 % IJ SUSP
20.0000 mL | Freq: Once | INTRAMUSCULAR | Status: DC
  Filled 2019-05-23 (×2): qty 20

## 2019-05-23 MED ORDER — OXYCODONE HCL 5 MG/5ML PO SOLN: 5.0000 mg | Freq: Once | ORAL | Status: DC | PRN

## 2019-05-23 MED ORDER — LACTATED RINGERS IV SOLN
INTRAVENOUS | Status: DC
  Administered 2019-05-23 (×2): via INTRAVENOUS

## 2019-05-23 MED ORDER — ACETAMINOPHEN 325 MG PO TABS
650.0000 mg | ORAL_TABLET | ORAL | Status: DC | PRN
  Administered 2019-05-25: 650 mg via ORAL
  Filled 2019-05-23: qty 2

## 2019-05-23 MED ORDER — ONDANSETRON HCL 4 MG/2ML IJ SOLN
INTRAMUSCULAR | Status: DC | PRN
  Administered 2019-05-23: 4 mg via INTRAVENOUS

## 2019-05-23 MED ORDER — ONDANSETRON HCL 4 MG PO TABS
4.0000 mg | ORAL_TABLET | Freq: Four times a day (QID) | ORAL | Status: DC | PRN
  Administered 2019-05-24: 4 mg via ORAL
  Filled 2019-05-23: qty 1

## 2019-05-23 MED ORDER — THROMBIN 20000 UNITS EX SOLR
CUTANEOUS | Status: DC | PRN
  Administered 2019-05-23: 20 mL via TOPICAL

## 2019-05-23 MED ORDER — PANTOPRAZOLE SODIUM 40 MG IV SOLR
40.0000 mg | Freq: Every day | INTRAVENOUS | Status: DC
  Administered 2019-05-23: 40 mg via INTRAVENOUS
  Filled 2019-05-23: qty 40

## 2019-05-23 MED ORDER — FENTANYL CITRATE (PF) 250 MCG/5ML IJ SOLN
INTRAMUSCULAR | Status: AC
  Filled 2019-05-23: qty 5

## 2019-05-23 MED ORDER — SODIUM CHLORIDE 0.9 % IV SOLN
INTRAVENOUS | Status: DC | PRN
  Administered 2019-05-23: 500 mL

## 2019-05-23 MED ORDER — VITAMIN D 25 MCG (1000 UNIT) PO TABS
2000.0000 [IU] | ORAL_TABLET | Freq: Every day | ORAL | Status: DC
  Administered 2019-05-23 – 2019-05-28 (×6): 2000 [IU] via ORAL
  Filled 2019-05-23 (×6): qty 2

## 2019-05-23 MED ORDER — THROMBIN 20000 UNITS EX SOLR
CUTANEOUS | Status: AC
  Filled 2019-05-23: qty 20000

## 2019-05-23 MED ORDER — OXYCODONE HCL 5 MG PO TABS: 10.0000 mg | ORAL_TABLET | Freq: Four times a day (QID) | ORAL | Status: DC | PRN

## 2019-05-23 MED ORDER — ROCURONIUM BROMIDE 100 MG/10ML IV SOLN
INTRAVENOUS | Status: DC | PRN
  Administered 2019-05-23: 70 mg via INTRAVENOUS
  Administered 2019-05-23: 20 mg via INTRAVENOUS

## 2019-05-23 MED ORDER — SODIUM CHLORIDE 0.9 % IV SOLN
250.0000 mL | INTRAVENOUS | Status: DC
  Administered 2019-05-23: 250 mL via INTRAVENOUS

## 2019-05-23 MED ORDER — CEFAZOLIN SODIUM-DEXTROSE 2-4 GM/100ML-% IV SOLN
2.0000 g | Freq: Three times a day (TID) | INTRAVENOUS | Status: AC
  Administered 2019-05-24 (×2): 2 g via INTRAVENOUS
  Filled 2019-05-23 (×2): qty 100

## 2019-05-23 MED ORDER — ACETAMINOPHEN 10 MG/ML IV SOLN
INTRAVENOUS | Status: DC | PRN
  Administered 2019-05-23: 1000 mg via INTRAVENOUS

## 2019-05-23 MED ORDER — 0.9 % SODIUM CHLORIDE (POUR BTL) OPTIME
TOPICAL | Status: DC | PRN
  Administered 2019-05-23 (×2): 1000 mL

## 2019-05-23 MED ORDER — PROMETHAZINE HCL 25 MG/ML IJ SOLN: 6.2500 mg | INTRAMUSCULAR | Status: DC | PRN

## 2019-05-23 MED ORDER — HYDROMORPHONE HCL 1 MG/ML IJ SOLN
INTRAMUSCULAR | Status: AC
  Administered 2019-05-23: 0.25 mg via INTRAVENOUS
  Filled 2019-05-23: qty 1

## 2019-05-23 MED ORDER — BUPIVACAINE LIPOSOME 1.3 % IJ SUSP
INTRAMUSCULAR | Status: DC | PRN
  Administered 2019-05-23: 20 mL

## 2019-05-23 MED ORDER — MENTHOL 3 MG MT LOZG: 1.0000 | LOZENGE | OROMUCOSAL | Status: DC | PRN

## 2019-05-23 MED ORDER — PROPOFOL 10 MG/ML IV BOLUS
INTRAVENOUS | Status: DC | PRN
  Administered 2019-05-23: 120 mg via INTRAVENOUS

## 2019-05-23 MED ORDER — HYDROMORPHONE HCL 1 MG/ML IJ SOLN: 0.5000 mg | INTRAMUSCULAR | Status: DC | PRN

## 2019-05-23 MED ORDER — ONDANSETRON HCL 4 MG/2ML IJ SOLN: 4.0000 mg | Freq: Four times a day (QID) | INTRAMUSCULAR | Status: DC | PRN

## 2019-05-23 MED ORDER — SODIUM CHLORIDE 0.9% FLUSH
3.0000 mL | Freq: Two times a day (BID) | INTRAVENOUS | Status: DC
  Administered 2019-05-23 – 2019-05-28 (×10): 3 mL via INTRAVENOUS

## 2019-05-23 MED ORDER — SODIUM CHLORIDE 0.9 % IV SOLN
INTRAVENOUS | Status: DC | PRN
  Administered 2019-05-23: 15 ug/min via INTRAVENOUS

## 2019-05-23 MED ORDER — LIDOCAINE-EPINEPHRINE 1 %-1:100000 IJ SOLN
INTRAMUSCULAR | Status: DC | PRN
  Administered 2019-05-23: 10 mL

## 2019-05-23 MED ORDER — ALUM & MAG HYDROXIDE-SIMETH 200-200-20 MG/5ML PO SUSP: 30.0000 mL | Freq: Four times a day (QID) | ORAL | Status: DC | PRN

## 2019-05-23 MED ORDER — DIPHENHYDRAMINE HCL 50 MG/ML IJ SOLN
INTRAMUSCULAR | Status: DC | PRN
  Administered 2019-05-23: 12.5 mg via INTRAVENOUS

## 2019-05-23 MED ORDER — VANCOMYCIN HCL IN DEXTROSE 1-5 GM/200ML-% IV SOLN: 1000.0000 mg | INTRAVENOUS | Status: DC

## 2019-05-23 SURGICAL SUPPLY — 88 items
ADH SKN CLS APL DERMABOND .7 (GAUZE/BANDAGES/DRESSINGS) ×1
APL SKNCLS STERI-STRIP NONHPOA (GAUZE/BANDAGES/DRESSINGS) ×1
BAG DECANTER FOR FLEXI CONT (MISCELLANEOUS) ×3 IMPLANT
BENZOIN TINCTURE PRP APPL 2/3 (GAUZE/BANDAGES/DRESSINGS) ×3 IMPLANT
BIT DRILL 5.0/4.0 (BIT) IMPLANT
BLADE CLIPPER SURG (BLADE) IMPLANT
BLADE SURG 11 STRL SS (BLADE) ×3 IMPLANT
BONE VIVIGEN FORMABLE 5.4CC (Bone Implant) ×3 IMPLANT
BUR CUTTER 7.0 ROUND (BURR) ×3 IMPLANT
BUR MATCHSTICK NEURO 3.0 LAGG (BURR) ×3 IMPLANT
CANISTER SUCT 3000ML PPV (MISCELLANEOUS) ×3 IMPLANT
CAP LOCKING (Cap) ×8 IMPLANT
CAP LOCKING 5.5 CREO (Cap) IMPLANT
CARTRIDGE OIL MAESTRO DRILL (MISCELLANEOUS) ×1 IMPLANT
CLOSURE STERI-STRIP 1/2X4 (GAUZE/BANDAGES/DRESSINGS) ×1
CLOSURE WOUND 1/2 X4 (GAUZE/BANDAGES/DRESSINGS) ×2
CLSR STERI-STRIP ANTIMIC 1/2X4 (GAUZE/BANDAGES/DRESSINGS) ×1 IMPLANT
CONT SPEC 4OZ CLIKSEAL STRL BL (MISCELLANEOUS) ×5 IMPLANT
COVER BACK TABLE 60X90IN (DRAPES) ×3 IMPLANT
COVER WAND RF STERILE (DRAPES) ×3 IMPLANT
DECANTER SPIKE VIAL GLASS SM (MISCELLANEOUS) ×5 IMPLANT
DERMABOND ADVANCED (GAUZE/BANDAGES/DRESSINGS) ×2
DERMABOND ADVANCED .7 DNX12 (GAUZE/BANDAGES/DRESSINGS) ×1 IMPLANT
DIFFUSER DRILL AIR PNEUMATIC (MISCELLANEOUS) ×3 IMPLANT
DRAPE C-ARM 42X72 X-RAY (DRAPES) ×6 IMPLANT
DRAPE C-ARMOR (DRAPES) IMPLANT
DRAPE HALF SHEET 40X57 (DRAPES) IMPLANT
DRAPE LAPAROTOMY 100X72X124 (DRAPES) ×3 IMPLANT
DRAPE SURG 17X23 STRL (DRAPES) ×5 IMPLANT
DRILL 5.0/4.0 (BIT) ×3
DRSG OPSITE 4X5.5 SM (GAUZE/BANDAGES/DRESSINGS) ×3 IMPLANT
DRSG OPSITE POSTOP 4X6 (GAUZE/BANDAGES/DRESSINGS) ×3 IMPLANT
DRSG OPSITE POSTOP 4X8 (GAUZE/BANDAGES/DRESSINGS) ×2 IMPLANT
DURAPREP 26ML APPLICATOR (WOUND CARE) ×3 IMPLANT
ELECT REM PT RETURN 9FT ADLT (ELECTROSURGICAL) ×3
ELECTRODE REM PT RTRN 9FT ADLT (ELECTROSURGICAL) ×1 IMPLANT
EVACUATOR 3/16  PVC DRAIN (DRAIN)
EVACUATOR 3/16 PVC DRAIN (DRAIN) IMPLANT
GAUZE 4X4 16PLY RFD (DISPOSABLE) IMPLANT
GAUZE SPONGE 4X4 12PLY STRL (GAUZE/BANDAGES/DRESSINGS) ×3 IMPLANT
GLOVE BIO SURGEON STRL SZ 6.5 (GLOVE) ×1 IMPLANT
GLOVE BIO SURGEON STRL SZ7 (GLOVE) ×2 IMPLANT
GLOVE BIO SURGEON STRL SZ8 (GLOVE) ×8 IMPLANT
GLOVE BIO SURGEONS STRL SZ 6.5 (GLOVE) ×1
GLOVE BIOGEL PI IND STRL 6.5 (GLOVE) IMPLANT
GLOVE BIOGEL PI IND STRL 7.0 (GLOVE) IMPLANT
GLOVE BIOGEL PI INDICATOR 6.5 (GLOVE) ×2
GLOVE BIOGEL PI INDICATOR 7.0 (GLOVE) ×4
GLOVE ECLIPSE 7.0 STRL STRAW (GLOVE) ×4 IMPLANT
GLOVE ECLIPSE 7.5 STRL STRAW (GLOVE) ×4 IMPLANT
GLOVE EXAM NITRILE XL STR (GLOVE) IMPLANT
GLOVE INDICATOR 8.5 STRL (GLOVE) ×6 IMPLANT
GOWN STRL REUS W/ TWL LRG LVL3 (GOWN DISPOSABLE) IMPLANT
GOWN STRL REUS W/ TWL XL LVL3 (GOWN DISPOSABLE) ×2 IMPLANT
GOWN STRL REUS W/TWL 2XL LVL3 (GOWN DISPOSABLE) IMPLANT
GOWN STRL REUS W/TWL LRG LVL3 (GOWN DISPOSABLE) ×6
GOWN STRL REUS W/TWL XL LVL3 (GOWN DISPOSABLE) ×6
GRAFT BNE MATRIX VG FRMBL MD 5 (Bone Implant) IMPLANT
HEMOSTAT POWDER KIT SURGIFOAM (HEMOSTASIS) IMPLANT
KIT BASIN OR (CUSTOM PROCEDURE TRAY) ×3 IMPLANT
KIT INFUSE XX SMALL 0.7CC (Orthopedic Implant) ×2 IMPLANT
KIT TURNOVER KIT B (KITS) ×3 IMPLANT
MILL MEDIUM DISP (BLADE) ×3 IMPLANT
NDL HYPO 21X1.5 SAFETY (NEEDLE) IMPLANT
NDL HYPO 25X1 1.5 SAFETY (NEEDLE) ×1 IMPLANT
NEEDLE HYPO 21X1.5 SAFETY (NEEDLE) ×3 IMPLANT
NEEDLE HYPO 25X1 1.5 SAFETY (NEEDLE) ×3 IMPLANT
NS IRRIG 1000ML POUR BTL (IV SOLUTION) ×5 IMPLANT
OIL CARTRIDGE MAESTRO DRILL (MISCELLANEOUS) ×3
PACK LAMINECTOMY NEURO (CUSTOM PROCEDURE TRAY) ×3 IMPLANT
PAD ARMBOARD 7.5X6 YLW CONV (MISCELLANEOUS) ×9 IMPLANT
ROD 40MM SPINAL (Rod) ×4 IMPLANT
SHAFT CREO 30MM (Neuro Prosthesis/Implant) ×8 IMPLANT
SPACER SUSTAIN RT 12 8D 9X26 (Spacer) ×4 IMPLANT
SPONGE LAP 4X18 RFD (DISPOSABLE) IMPLANT
SPONGE SURGIFOAM ABS GEL 100 (HEMOSTASIS) ×3 IMPLANT
STRIP CLOSURE SKIN 1/2X4 (GAUZE/BANDAGES/DRESSINGS) ×4 IMPLANT
SUT VIC AB 0 CT1 18XCR BRD8 (SUTURE) ×2 IMPLANT
SUT VIC AB 0 CT1 8-18 (SUTURE) ×6
SUT VIC AB 2-0 CT1 18 (SUTURE) ×3 IMPLANT
SUT VIC AB 4-0 PS2 27 (SUTURE) ×3 IMPLANT
SYR 30ML LL (SYRINGE) ×2 IMPLANT
SYR CONTROL 10ML LL (SYRINGE) ×2 IMPLANT
TOWEL GREEN STERILE (TOWEL DISPOSABLE) ×3 IMPLANT
TOWEL GREEN STERILE FF (TOWEL DISPOSABLE) ×3 IMPLANT
TRAY FOLEY MTR SLVR 16FR STAT (SET/KITS/TRAYS/PACK) ×3 IMPLANT
TULIP CREP AMP 5.5MM (Orthopedic Implant) ×8 IMPLANT
WATER STERILE IRR 1000ML POUR (IV SOLUTION) ×3 IMPLANT

## 2019-05-23 NOTE — Plan of Care (Signed)

## 2019-05-23 NOTE — Op Note (Signed)
Preoperative diagnosis: Herniated nucleus pulposus L3-4 with instability and herniated nucleus pulposus L4-5 left  Postoperative diagnosis: Same  Procedure: Redo decompressive lumbar laminectomy L3-4 in excess and requiring more work than would be needed with a standard interbody fusion with complete medial facetectomies and radical foraminotomies of the L4 and L3 nerve roots.  2.  Left-sided lumbar laminotomy and microdiscectomy with microdissection of the left L5 nerve root microscopic discectomy at L4-5  3.  Posterior lumbar interbody fusion L3-4 utilizing globus titanium peek cages packed with locally harvested autograft mixed with vivigen and BMP  4.Cortical screw fixation L3-4 utilizing globus Creo amp modular cortical screw set  Surgeon: Dominica Severin Demetrick Eichenberger  Assistant: Dr. Consuella Lose  Anesthesia: General  EBL: 60  HPI: 83 year old female with longstanding back pain but a acute decompensation with left-sided leg leg weakness and foot drop work-up revealed complete myelographic block at L3-4 with herniated nucleus pulposis as well as disc herniation L4-5 on the left due to patient's left-sided foot drop progression of clinical syndrome imaging findings I recommended decompression stabilization procedure at L3-4 and a microdiscectomy at L4-5.  I extensively went over the risks and benefits of the procedure with her as well as perioperative course expectations of outcome and alternatives of surgery and she understood and agreed to proceed forward.  Operative procedure: Patient was brought into the OR was used in general anesthesia positioned prone the Wilson frame her back was prepped and draped in routine sterile fashion.  Roll incision was opened up and extended slightly cephalad and caudad start scar tissue was dissected free and subperiosteal dissection was carried in the residual lamina at L3-4 as well as at L4-5 on the left.  The spinous process was identified and removed at L3 the facet  joints were drilled down complete decompressive laminectomy was begun initially on the patient's left side she had previous surgery on the right performed complete medial facetectomies and radical foraminotomies of the L3 and L4 nerve root.  There marching working through the scar tissue I teased the scar tissue off of the residual lamina defect and perform another complete decompressive laminectomy on the right as well the extensive scar tissue causing compression of the IV nerve root but after complete facetectomies foraminotomies were performed there is no further stenosis dorsally that then worked in the space bilaterally remove several large fragments of disc cleaned out the space prepared the endplates with sequential distraction I opened with an 11 distractor in place and selected 9-12 insert and rotate titanium cages that packed with locally harvested autograft mix.  After adequate discectomy and endplate preparation inserted both cages with and aggressive amount of autograft mix centrally.  Then 10-second cortical screw placement under fluoroscopy which was used with cages and 4 cortical screws were placed all screws had excellent purchase and postop fluoroscopy confirmed good position of the implants.  Then attention was taken to the laminotomy I performed laminotomy with a high-speed drill and intermittent Kerrison punch on the left at L4-5 under microscope illumination under by the medial gutter allowed defecation of thecal sac and L5 pedicle there was a very large disc radiation densely stuck to the undersurface of the proximal 5 root this was the plane was developed to space was incised and working underneath the annulus I remove several large free fragments of disc and cleaned out the space at L4-5.  At the discectomy was no further stenosis to the lower ear.  Wounds are copious irrigated to Kassim states was maintained the rods  were assembled anchoring knots were placed the 3 screws compress against  L4 Gelfoam was opened up the dura after reexploration all the foramina and the wound was closed in layers after placement of a medium Hemovac drain with interrupted Vicryl Exparel was injected in the fascia and the wound was closed in layers with a running 4 subcuticular in the skin.  Dermabond benzoin Steri-Strips and a sterile dressing was applied patient recovery in stable condition.  At the end the case on the account sponge counts were correct.

## 2019-05-23 NOTE — Anesthesia Procedure Notes (Signed)
Procedure Name: Intubation Date/Time: 05/23/2019 3:53 PM Performed by: Oletta Lamas, CRNA Pre-anesthesia Checklist: Patient identified, Emergency Drugs available, Suction available and Patient being monitored Patient Re-evaluated:Patient Re-evaluated prior to induction Oxygen Delivery Method: Circle System Utilized Preoxygenation: Pre-oxygenation with 100% oxygen Induction Type: IV induction Ventilation: Mask ventilation without difficulty Laryngoscope Size: Mac Grade View: Grade II Tube type: Oral Number of attempts: 1 Airway Equipment and Method: Stylet Placement Confirmation: ETT inserted through vocal cords under direct vision,  positive ETCO2 and breath sounds checked- equal and bilateral Secured at: 21 cm Tube secured with: Tape Dental Injury: Teeth and Oropharynx as per pre-operative assessment  Comments: Mil 2 grade 3 view.  MAC 3 grade 2 view.

## 2019-05-23 NOTE — H&P (Signed)
Tara [name] is an 83 y.o. female.   Chief Complaint: Back and left leg pain HPI: 83 year old female who presents with a two-week history of left-sided foot drop back and left leg pain.  Work-up revealed critical stenosis at 3 4 with large disc radiation possible recurrence of synovial cyst and a disclamation L4-5 on the left due to progression of clinical syndrome imaging findings and failed conservative treatment I recommended decompression interbody fusion at L3-4 and a microdiscectomy at L4-5 on the left.  I have extensively gone over the risks and benefits of the operation with her as well as perioperative course expectations of outcome and alternatives of surgery and she understands and agrees to proceed forward.  Past Medical History:  Diagnosis Date  . Arthritis    knees  . Breast cancer ([place]) 09/30/15   right breast  . Dependent edema   . Depression    just lost spouse in Nov. 2018  . Essential hypertension, benign 04/15/2011  . Herniated lumbar disc without myelopathy   . Hx of staphylococcal infection   . Hypertension   . Hypothyroidism   . Obesity   . Pre-diabetes   . Radicular pain   . Recurrent UTI   . Stroke [facility])    ' mini"  . Thyroid disease    hypothyroidism  . Vertigo   . Wears contact lenses    one    Past Surgical History:  Procedure Laterality Date  . ANKLE SURGERY  1994   RIGHT  . BACK SURGERY    . BREAST LUMPECTOMY WITH NEEDLE LOCALIZATION Right 09/30/2015   Procedure: RIGHT BREAST LUMPECTOMY WITH NEEDLE LOCALIZATION;  Surgeon: Fanny Skates, MD;  Location: [place];  Service: General;  Laterality: Right;  . BREAST SURGERY    . COLONOSCOPY W/ BIOPSIES AND POLYPECTOMY    . EYE SURGERY     bilateral  . HAND SURGERY  1951   RIGHT HAND  . JOINT REPLACEMENT     rt shoulder rotator cuff  . KNEE ARTHROSCOPY     left knee  . LUMBAR LAMINECTOMY/DECOMPRESSION MICRODISCECTOMY Right 01/11/2013   Procedure: LUMBAR LAMINECTOMY/DECOMPRESSION  MICRODISCECTOMY 1 LEVEL,Right Lumbar Three-Four;  Surgeon: Elaina Hoops, MD;  Location: [place] [facility];  Service: Neurosurgery;  Laterality: Right;  . ROTATOR CUFF REPAIR  2011   RIGHT  . TONSILLECTOMY    . TOTAL KNEE ARTHROPLASTY     Left  . TOTAL KNEE ARTHROPLASTY Right 12/25/2017   Procedure: TOTAL KNEE ARTHROPLASTY;  Surgeon: Vickey Huger, MD;  Location: [place];  Service: Orthopedics;  Laterality: Right;    Family History  Problem Relation Age of Onset  . Breast cancer Sister   . Stroke Sister   . Cancer Brother        Kidney  . Heart failure Father   . Uterine cancer Sister   . Breast cancer Sister        Uterine  . Cancer Brother        Lung   Social History:  reports that she has never smoked. She has never used smokeless tobacco. She reports that she does not drink alcohol or use drugs.  Allergies:  Allergies  Allergen Reactions  . Ceclor [Cefaclor] Hives and Itching    Medications Prior to Admission  Medication Sig Dispense Refill  . amLODipine (NORVASC) 5 MG tablet TAKE 1 TABLET EVERY DAY (Patient taking differently: Take 5 mg by mouth daily. ) 90 tablet 2  . aspirin 325 MG tablet Take  325 mg by mouth daily.    Marland Kitchen atorvastatin (LIPITOR) 40 MG tablet TAKE 1 TABLET EVERY DAY (Patient taking differently: Take 40 mg by mouth daily. ) 90 tablet 3  . Cholecalciferol 2000 units CAPS Take 2,000 Units by mouth daily.    . diazepam (VALIUM) 5 MG tablet 1 tablet po twice  daily as needed for anxiety (Patient taking differently: Take 5 mg by mouth 2 (two) times daily as needed for anxiety. ) 60 tablet 5  . levothyroxine (SYNTHROID, LEVOTHROID) 75 MCG tablet TAKE 1 TABLET EVERY DAY (Patient taking differently: Take 75 mcg by mouth daily before breakfast. ) 90 tablet 0  . losartan (COZAAR) 100 MG tablet TAKE 1 TABLET EVERY DAY (Patient taking differently: Take 50 mg by mouth daily. ) 90 tablet 3  . OVER THE COUNTER MEDICATION Take 1 capsule by mouth daily. Fruit Full Antioxidant Extract  Supplement    . OVER THE COUNTER MEDICATION Take 1 capsule by mouth daily. Cruciferous Extract Supplement    . Oxycodone HCl 10 MG TABS Take 10 mg by mouth every 6 (six) hours as needed (for severe pain).      Results for orders placed or performed during the hospital encounter of 05/23/19 (from the past 48 hour(s))  Type and screen     Status: None   Collection Time: 05/23/19  1:36 PM  Result Value Ref Range   ABO/RH(D) O POS    Antibody Screen NEG    Sample Expiration      05/26/2019,2359 Performed at [facility], [place] [address] [place], [place] [address]   Basic metabolic panel     Status: Abnormal   Collection Time: 05/23/19  1:40 PM  Result Value Ref Range   Sodium 142 135 - 145 mmol/L   Potassium 3.2 (L) 3.5 - 5.1 mmol/L   Chloride 102 98 - 111 mmol/L   CO2 30 22 - 32 mmol/L   Glucose, Bld 111 (H) 70 - 99 mg/dL   BUN 13 8 - 23 mg/dL   Creatinine, Ser 0.72 0.44 - 1.00 mg/dL   Calcium 9.0 8.9 - 10.3 mg/dL   GFR calc non Af Amer >60 >60 mL/min   GFR calc Af Amer >60 >60 mL/min   Anion gap 10 5 - 15    Comment: Performed at [facility], [place] [address] [place], [place] [address]  CBC     Status: Abnormal   Collection Time: 05/23/19  1:40 PM  Result Value Ref Range   WBC 11.6 (H) 4.0 - 10.5 K/uL   RBC 4.35 3.87 - 5.11 MIL/uL   Hemoglobin 14.2 12.0 - 15.0 g/dL   HCT 42.1 36.0 - 46.0 %   MCV 96.8 80.0 - 100.0 fL   MCH 32.6 26.0 - 34.0 pg   MCHC 33.7 30.0 - 36.0 g/dL   RDW 12.8 11.5 - 15.5 %   Platelets 222 150 - 400 K/uL   nRBC 0.0 0.0 - 0.2 %    Comment: Performed at [facility], [place] [address], [place], [place] [address]   No results found.  Review of Systems  Musculoskeletal: Positive for back pain and joint pain.  Neurological: Positive for tingling, sensory change and focal weakness.    Blood pressure 129/76, pulse 70, temperature 98.1 F (36.7 C), temperature source Oral, resp. rate 16, height 5\' 3"  (1.6 m), weight 79.4 kg, SpO2 97  %. Physical Exam  Constitutional: She appears well-developed.  HENT:  Head: Normocephalic.  Eyes: Pupils are equal, round, and reactive to light.  Neck: Normal range of motion.  Respiratory: Effort normal.  GI: Soft.  Neurological: She is alert. She has normal strength. GCS eye subscore is 4. GCS verbal subscore is 5. GCS motor subscore is 6.  Patient has a left-sided foot drop 2 out of 5 otherwise she is 5 out of 5  Skin: Skin is warm.     Assessment/Plan 83 year old female presents for by decompression interbody fusion L3-4 and microdiscectomy on the left L4-5  Nikesha Kwasny P, MD 05/23/2019, 3:34 PM

## 2019-05-23 NOTE — Anesthesia Preprocedure Evaluation (Signed)
Anesthesia Evaluation  Patient identified by MRN, date of birth, ID band Patient awake    Reviewed: Allergy & Precautions, NPO status , Patient's Chart, lab work & pertinent test results  Airway Mallampati: I  TM Distance: >3 FB Neck ROM: Full    Dental no notable dental hx. (+) Dental Advisory Given, Teeth Intact   Pulmonary    Pulmonary exam normal breath sounds clear to auscultation       Cardiovascular hypertension, Pt. on medications Normal cardiovascular exam Rhythm:Regular Rate:Normal     Neuro/Psych  Headaches, Depression TIA   GI/Hepatic   Endo/Other  Hypothyroidism   Renal/GU      Musculoskeletal  (+) Arthritis ,   Abdominal (+) + obese,   Peds  Hematology   Anesthesia Other Findings   Reproductive/Obstetrics                             Anesthesia Physical  Anesthesia Plan  ASA: III  Anesthesia Plan: General   Post-op Pain Management:    Induction: Intravenous  PONV Risk Score and Plan: 3 and Ondansetron, Dexamethasone, Midazolam and Treatment may vary due to age or medical condition  Airway Management Planned: Oral ETT  Additional Equipment:   Intra-op Plan:   Post-operative Plan: Extubation in OR  Informed Consent: I have reviewed the patients History and Physical, chart, labs and discussed the procedure including the risks, benefits and alternatives for the proposed anesthesia with the patient or authorized representative who has indicated his/her understanding and acceptance.       Plan Discussed with: CRNA and Surgeon  Anesthesia Plan Comments:         Anesthesia Quick Evaluation

## 2019-05-23 NOTE — Anesthesia Postprocedure Evaluation (Signed)
Anesthesia Post Note  Patient: Takara W Hamil  Procedure(s) Performed: Lumbar 3-4 Posterior lumbar interbody fusion with a left Lumbar 4-5 Microdiscectomy (N/A Back)     Patient location during evaluation: PACU Anesthesia Type: General Level of consciousness: awake Pain management: pain level controlled Vital Signs Assessment: post-procedure vital signs reviewed and stable Respiratory status: spontaneous breathing, nonlabored ventilation, respiratory function stable and patient connected to nasal cannula oxygen Cardiovascular status: blood pressure returned to baseline and stable Postop Assessment: no apparent nausea or vomiting Anesthetic complications: no    Last Vitals:  Vitals:   05/23/19 2015 05/23/19 2045  BP: (!) 145/64 (!) 126/54  Pulse: 75 68  Resp: 15 12  Temp:  (!) 36.1 C  SpO2: 100% 100%    Last Pain:  Vitals:   05/23/19 2045  TempSrc:   PainSc: 4                  Ryan P Ellender

## 2019-05-23 NOTE — Progress Notes (Signed)
New admission from PACU to room 4NP08.  VSS and patient drowsy but easily arousable.  Family called and updated on patient status. Patient states that she is not in any pain right now. Will continue to monitor.

## 2019-05-23 NOTE — Transfer of Care (Signed)
Immediate Anesthesia Transfer of Care Note  Patient: Tara [name]  Procedure(s) Performed: Lumbar 3-4 Posterior lumbar interbody fusion with a left Lumbar 4-5 Microdiscectomy (N/A Back)  Patient Location: PACU  Anesthesia Type:General  Level of Consciousness: awake, drowsy and patient cooperative  Airway & Oxygen Therapy: Patient Spontanous Breathing and Patient connected to nasal cannula oxygen  Post-op Assessment: Report given to RN and Post -op Vital signs reviewed and stable  Post vital signs: Reviewed and stable  Last Vitals:  Vitals Value Taken Time  BP 135/65 05/23/19 1957  Temp    Pulse 72 05/23/19 1957  Resp 21 05/23/19 1957  SpO2 99 % 05/23/19 1957  Vitals shown include unvalidated device data.  Last Pain:  Vitals:   05/23/19 1320  TempSrc:   PainSc: 0-No pain      Patients Stated Pain Goal: 2 (09/32/35 5732)  Complications: No apparent anesthesia complications

## 2019-05-24 MED ORDER — ENSURE ENLIVE PO LIQD: 237.0000 mL | Freq: Two times a day (BID) | ORAL | Status: DC

## 2019-05-24 MED ORDER — DOCUSATE SODIUM 100 MG PO CAPS
100.0000 mg | ORAL_CAPSULE | Freq: Every day | ORAL | Status: DC | PRN
  Administered 2019-05-24 – 2019-05-27 (×2): 100 mg via ORAL
  Filled 2019-05-24 (×2): qty 1

## 2019-05-24 MED ORDER — PANTOPRAZOLE SODIUM 40 MG PO TBEC
40.0000 mg | DELAYED_RELEASE_TABLET | Freq: Every day | ORAL | Status: DC
  Administered 2019-05-24 – 2019-05-27 (×4): 40 mg via ORAL
  Filled 2019-05-24 (×4): qty 1

## 2019-05-24 MED ORDER — BOOST / RESOURCE BREEZE PO LIQD CUSTOM
1.0000 | Freq: Two times a day (BID) | ORAL | Status: DC
  Administered 2019-05-24 – 2019-05-28 (×5): 1 via ORAL

## 2019-05-24 MED ORDER — POLYETHYLENE GLYCOL 3350 17 G PO PACK
17.0000 g | PACK | Freq: Every day | ORAL | Status: DC | PRN
  Administered 2019-05-27: 17 g via ORAL
  Filled 2019-05-24: qty 1

## 2019-05-24 NOTE — Progress Notes (Signed)
Initial Nutrition Assessment   RD working remotely.  DOCUMENTATION CODES:   Obesity unspecified  INTERVENTION:  Provide Boost Breeze po BID, each supplement provides 250 kcal and 9 grams of protein.  Encourage adequate PO intake.   NUTRITION DIAGNOSIS:   Increased nutrient needs related to post-op healing as evidenced by estimated needs.  GOAL:   Patient will meet greater than or equal to 90% of their needs  MONITOR:   PO intake, Supplement acceptance, Skin, Weight trends, Labs, I & O's  REASON FOR ASSESSMENT:   Malnutrition Screening Tool    ASSESSMENT:   83 year old female who presents with a two-week history of left-sided foot drop back and left leg pain.  Work-up revealed critical stenosis at 3 4 with large disc radiation possible recurrence of synovial cyst and a disclamation L4-5 on the left due to progression of clinical syndrome imaging findings and failed conservative treatment.  Procedure(7/2): Lumbar 3-4 Posterior lumbar interbody fusion with a left Lumbar 4-5 Microdiscectomy  RD unable to obtain pt nutrition history during attempted time of contact. Pt is currently on a regular diet with thin liquids. Pt currently Ensure ordered however pt has been refusing them. RD to order Boost Breeze instead to aid in adequate caloric and protein needs as well as in healing.   Unable to complete Nutrition-Focused physical exam at this time.   Labs and medications reviewed.   Diet Order:   Diet Order            Diet regular Room service appropriate? Yes; Fluid consistency: Thin  Diet effective now              EDUCATION NEEDS:   Not appropriate for education at this time  Skin:  Skin Assessment: Skin Integrity Issues: Skin Integrity Issues:: Incisions Incisions: back  Last BM:  Unknown  Height:   Ht Readings from Last 1 Encounters:  05/23/19 5\' 3"  (1.6 m)    Weight:   Wt Readings from Last 1 Encounters:  05/23/19 79.4 kg    Ideal Body Weight:   52.27 kg  BMI:  Body mass index is 31 kg/m.  Estimated Nutritional Needs:   Kcal:  1600-1800  Protein:  80-90 grams  Fluid:  1.6 - 1.8 L/day    Corrin Parker, MS, RD, LDN Pager # [phone] After hours/ weekend pager # [phone]

## 2019-05-24 NOTE — Social Work (Signed)
CSW acknowledging consult for SNF placement. Will follow for therapy recommendations.   Whitt Auletta, MSW, LCSWA [place] Clinical Social Work [phone]   

## 2019-05-24 NOTE — Progress Notes (Signed)
Spoke with Pt's son. He would like to be the point of contact when reaching out about his mother's care. He said that he would pass along information to other family members. Pt's family is also interested in [facility] and hoping that is an option for her. I informed him that would need to be the recommendation for her follow up care, but I would pass along their wishes.

## 2019-05-24 NOTE — TOC Initial Note (Signed)
Transition of Care [facility]) - Initial/Assessment Note    Patient Details  Name: Tara [name] MRN: [id number] Date of Birth: 09-20-1936  [facility] of [facility] CM/SW Contact:    Alexander Mt, [place] Phone Number: 05/24/2019, 2:48 PM  Clinical Narrative:                 CSW spoke with pt at bedside, introduced self, role, reason for visit. Pt pleasant, sometimes repetitive in story but able to understand CSW and follow conversation. Pt from home alone, she has two children (son and daughter). We discussed PT recommendations, pt states she couldn't do much with them today but hopes to continue to work with therapy and go home. Pt familiar with home health and if she can go home she would like to go with [facility] with additional CNA private pay assistance.   Pt not as familiar with SNF placement process, we discussed referrals and how placement currently during coronavirus has limited visitation and restrictions. Pt states understanding of this and wants to discuss it with her son and daughter further. Should CSW or TOC staff need to speak with someone she prefers her son be the first contact.   CSW will f/u and leave hand off for CSW/RNCM to follow up over weekend.   Expected Discharge Plan: Skilled Nursing Facility(vs [facility]) Barriers to Discharge: Continued Medical Work up   Patient Goals and CMS Choice Patient states their goals for this hospitalization and ongoing recovery are:: if I can to go home, but I will go to SNF if I need to CMS Medicare.gov Compare Post Acute Care list provided to:: Patient Choice offered to / list presented to : Patient  Expected Discharge Plan and Services Expected Discharge Plan: Skilled Nursing Facility(vs [facility]) In-house Referral: Clinical Social Work Discharge Planning Services: CM Consult Post Acute Care Choice: [place] Living arrangements for the past 2 months: Apartment                                      Prior  Living Arrangements/Services Living arrangements for the past 2 months: Apartment Lives with:: Self Patient language and need for interpreter reviewed:: Yes(no needs) Do you feel safe going back to the place where you live?: Yes      Need for Family Participation in Patient Care: Yes (Comment) Care giver support system in place?: Yes (comment) Current home services: DME Criminal Activity/Legal Involvement Pertinent to Current Situation/Hospitalization: No - Comment as needed  Activities of Daily Living Home Assistive Devices/Equipment: Cane (specify quad or straight), Walker (specify type), Wheelchair ADL Screening (condition at time of admission) Patient's cognitive ability adequate to safely complete daily activities?: Yes Is the patient deaf or have difficulty hearing?: No Does the patient have difficulty seeing, even when wearing glasses/contacts?: No Does the patient have difficulty concentrating, remembering, or making decisions?: No Patient able to express need for assistance with ADLs?: Yes Does the patient have difficulty dressing or bathing?: Yes Independently performs ADLs?: No Communication: Independent Dressing (OT): Needs assistance Is this a change from baseline?: Pre-admission baseline Grooming: Needs assistance Is this a change from baseline?: Pre-admission baseline Feeding: Independent Bathing: Needs assistance Is this a change from baseline?: Pre-admission baseline Toileting: Needs assistance Is this a change from baseline?: Pre-admission baseline In/Out Bed: Needs assistance Is this a change from baseline?: Pre-admission baseline Walks in Home: Needs assistance Is this a change from  baseline?: Pre-admission baseline Does the patient have difficulty walking or climbing stairs?: Yes Weakness of Legs: Left Weakness of Arms/Hands: None  Permission Sought/Granted Permission sought to share information with : Facility Sport and exercise psychologist, Family  Supports Permission granted to share information with : Yes, Verbal Permission Granted  Share Information with NAME: Else Habermann     Permission granted to share info w Relationship: Son  Permission granted to share info w Contact Information: [phone]  Emotional Assessment Appearance:: Appears stated age, Well-Groomed Attitude/Demeanor/Rapport: Gracious, Engaged Affect (typically observed): Accepting, Adaptable, Appropriate, Pleasant Orientation: : Oriented to Place, Oriented to Self, Oriented to  Time, Oriented to Situation Alcohol / Substance Use: Not Applicable Psych Involvement: No (comment)  Admission diagnosis:  Herniated lumbar disc without myelopathy Patient Active Problem List   Diagnosis Date Noted  . Lumbar spinal stenosis 05/23/2019  . HNP (herniated nucleus pulposus), lumbar 05/23/2019  . TIA (transient ischemic attack) 07/02/2018  . Chronic nonintractable headache 07/02/2018  . S/P total knee replacement 12/25/2017  . Ductal carcinoma in situ of right breast 08/17/2015  . Breast cancer of upper-outer quadrant of right female breast ([facility]) 08/14/2015  . Right hip pain 10/27/2012  . Low back pain 10/27/2012  . Hypertension   . Osteoarthritis of right knee 11/30/2011  . Baker's cyst of knee 11/30/2011  . Dependent edema 06/19/2011  . Prediabetes 06/19/2011  . Urinary tract infection, recurrent 06/19/2011  . Urinary incontinence 06/19/2011  . Benign positional vertigo 06/19/2011  . Hypothyroidism 06/19/2011  . Tubular adenoma of colon 06/19/2011  . Essential hypertension, benign 04/15/2011   PCP:  [facility] [place]:   [place], [place] [address] Phone: [phone] Fax: [phone]     Social Determinants of Health (SDOH) Interventions    Readmission Risk Interventions No flowsheet data found.

## 2019-05-24 NOTE — NC FL2 (Signed)
[place] LEVEL OF CARE SCREENING TOOL     IDENTIFICATION  Patient Name: Tara [name] Birthdate: 1936/04/14 Sex: female Admission Date (Current Location): 05/23/2019  [facility] and [place] Number:  [employer] and Address:  [facility] [place], [place] [address], [place], [place] [address]      Provider Number: [id number]  Attending Physician Name and Address:  Kary Kos, MD  Relative Name and Phone Number:       Current Level of Care: Hospital Recommended Level of Care: [place] Prior Approval Number:    Date Approved/Denied:   PASRR Number: [id number]  Discharge Plan: SNF    Current Diagnoses: Patient Active Problem List   Diagnosis Date Noted  . Lumbar spinal stenosis 05/23/2019  . HNP (herniated nucleus pulposus), lumbar 05/23/2019  . TIA (transient ischemic attack) 07/02/2018  . Chronic nonintractable headache 07/02/2018  . S/P total knee replacement 12/25/2017  . Ductal carcinoma in situ of right breast 08/17/2015  . Breast cancer of upper-outer quadrant of right female breast ([place]) 08/14/2015  . Right hip pain 10/27/2012  . Low back pain 10/27/2012  . Hypertension   . Osteoarthritis of right knee 11/30/2011  . Baker's cyst of knee 11/30/2011  . Dependent edema 06/19/2011  . Prediabetes 06/19/2011  . Urinary tract infection, recurrent 06/19/2011  . Urinary incontinence 06/19/2011  . Benign positional vertigo 06/19/2011  . Hypothyroidism 06/19/2011  . Tubular adenoma of colon 06/19/2011  . Essential hypertension, benign 04/15/2011    Orientation RESPIRATION BLADDER Height & Weight     Self, Time, Situation, Place  Normal Continent Weight: 175 lb (79.4 kg) Height:  5\' 3"  (160 cm)  BEHAVIORAL SYMPTOMS/MOOD NEUROLOGICAL BOWEL NUTRITION STATUS      Continent Diet(see discharge summary)  AMBULATORY STATUS COMMUNICATION OF NEEDS Skin   Extensive Assist Verbally Surgical wounds(incision on back with  honeycomb dressing)                       Personal Care Assistance Level of Assistance  Bathing, Dressing, Feeding Bathing Assistance: Maximum assistance Feeding assistance: Independent Dressing Assistance: Maximum assistance     Functional Limitations Info  Sight, Hearing, Speech Sight Info: Adequate Hearing Info: Adequate Speech Info: Adequate    SPECIAL CARE FACTORS FREQUENCY  PT (By licensed PT), OT (By licensed OT)     PT Frequency: 5x week OT Frequency: 5x week            Contractures Contractures Info: Not present    Additional Factors Info  Code Status, Allergies Code Status Info: Full Code Allergies Info: Ceclor (Cefaclor)           Current Medications (05/24/2019):  This is the current hospital active medication list Current Facility-Administered Medications  Medication Dose Route Frequency Provider Last Rate Last Dose  . 0.9 %  sodium chloride infusion  250 mL Intravenous Continuous Kary Kos, MD 1 mL/hr at 05/23/19 2301 250 mL at 05/23/19 2301  . acetaminophen (TYLENOL) tablet 650 mg  650 mg Oral Q4H PRN Kary Kos, MD       Or  . acetaminophen (TYLENOL) suppository 650 mg  650 mg Rectal Q4H PRN Kary Kos, MD      . alum & mag hydroxide-simeth (MAALOX/MYLANTA) 200-200-20 MG/5ML suspension 30 mL  30 mL Oral Q6H PRN Kary Kos, MD      . amLODipine (NORVASC) tablet 5 mg  5 mg Oral Daily Kary Kos, MD   5 mg  at 05/24/19 0805  . aspirin tablet 325 mg  325 mg Oral Daily Kary Kos, MD   325 mg at 05/24/19 0805  . atorvastatin (LIPITOR) tablet 40 mg  40 mg Oral Daily Kary Kos, MD   40 mg at 05/24/19 0805  . bupivacaine liposome (EXPAREL) 1.3 % injection 266 mg  20 mL Infiltration Once Kary Kos, MD      . cholecalciferol (VITAMIN D3) tablet 2,000 Units  2,000 Units Oral Daily Kary Kos, MD   2,000 Units at 05/24/19 0804  . cyclobenzaprine (FLEXERIL) tablet 10 mg  10 mg Oral TID PRN Kary Kos, MD      . diazepam (VALIUM) tablet 5 mg  5 mg Oral BID  PRN Kary Kos, MD      . docusate sodium (COLACE) capsule 100 mg  100 mg Oral Daily PRN Costella, Vista Mink, PA-C      . feeding supplement (ENSURE ENLIVE) (ENSURE ENLIVE) liquid 237 mL  237 mL Oral BID BM Kary Kos, MD      . HYDROmorphone (DILAUDID) injection 0.5 mg  0.5 mg Intravenous Q2H PRN Kary Kos, MD      . levothyroxine (SYNTHROID) tablet 75 mcg  75 mcg Oral QAC breakfast Kary Kos, MD   75 mcg at 05/24/19 0607  . losartan (COZAAR) tablet 50 mg  50 mg Oral Daily Kary Kos, MD   50 mg at 05/24/19 0805  . menthol-cetylpyridinium (CEPACOL) lozenge 3 mg  1 lozenge Oral PRN Kary Kos, MD       Or  . phenol (CHLORASEPTIC) mouth spray 1 spray  1 spray Mouth/Throat PRN Kary Kos, MD      . ondansetron [facility]) tablet 4 mg  4 mg Oral Q6H PRN Kary Kos, MD   4 mg at 05/24/19 8882   Or  . ondansetron (ZOFRAN) injection 4 mg  4 mg Intravenous Q6H PRN Kary Kos, MD      . oxyCODONE (Oxy IR/ROXICODONE) immediate release tablet 10 mg  10 mg Oral Q3H PRN Kary Kos, MD   10 mg at 05/24/19 0144  . pantoprazole (PROTONIX) injection 40 mg  40 mg Intravenous QHS Kary Kos, MD   40 mg at 05/23/19 2259  . polyethylene glycol (MIRALAX / GLYCOLAX) packet 17 g  17 g Oral Daily PRN Costella, Vincent J, PA-C      . sodium chloride flush (NS) 0.9 % injection 3 mL  3 mL Intravenous Q12H Kary Kos, MD   3 mL at 05/24/19 8003  . sodium chloride flush (NS) 0.9 % injection 3 mL  3 mL Intravenous PRN Kary Kos, MD         Discharge Medications: Please see discharge summary for a list of discharge medications.  Relevant Imaging Results:  Relevant Lab Results:   Additional Information [id number] [place], [place]

## 2019-05-24 NOTE — Progress Notes (Signed)
  NEUROSURGERY PROGRESS NOTE   No issues overnight.  Complains of appropriate back soreness No significant change in LLE strength  EXAM:  BP 132/66 (BP Location: Left Arm)   Pulse 78   Temp 97.9 F (36.6 C) (Oral)   Resp 11   Ht 5\' 3"  (1.6 m)   Wt 79.4 kg   SpO2 97%   BMI 31.00 kg/m   Awake, alert, oriented  Speech fluent, appropriate  CN grossly intact  5/5 BUE/BLE with exception of 2/5 DF/EHL  PLAN Stable neurologically PT/OT today

## 2019-05-24 NOTE — Evaluation (Signed)
Physical Therapy Evaluation Patient Details Name: Tara [name] MRN: [id number] DOB: [birth year] Today's Date: 05/24/2019   History of Present Illness  Patient is a 83 y/o female who presents s/p L3-4 PLIF and left sided Lumbar 4-5 Microdiscectomy. PMH includes left TKA, recurrent UTI, BPPV, HTN, stroke, depression, breast ca, dependent edema.  Clinical Impression  Patient presents with pain and post surgical deficits s/p above. Pt from [place] and not the best historian. Reports being independent with ADLs and ambulation prior to 2 weeks ago. Since the back and leg pain started, pt has only been doing SPTs to w/c and BSC with assist. Pt with poor memory and attention. Difficult historian that leads to tangents unrelated to questions asked. Today, pt requires Min-Mod A for bed mobility and unable to stand with Max A of 2. Further trials deferred due to episode of vertigo and then nausea. Rn present and aware. Education re: back precautions. Needs back brace per orders but nothing has been ordered and pt reports she spoke with someone about it but unsure what he was telling her. Would benefit from SNF to maximize independence and mobility prior to return home. Will follow acutely.    Follow Up Recommendations SNF;Supervision for mobility/OOB;Supervision/Assistance - 24 hour    Equipment Recommendations  None recommended by PT    Recommendations for Other Services       Precautions / Restrictions Precautions Precautions: Back Precaution Comments: Reviewed  back precautions Restrictions Weight Bearing Restrictions: No      Mobility  Bed Mobility Overal bed mobility: Needs Assistance Bed Mobility: Rolling;Sidelying to Sit;Sit to Sidelying Rolling: Min guard Sidelying to sit: Min guard;HOB elevated     Sit to sidelying: Mod assist;HOB elevated General bed mobility comments: Cues for log roll technique, use of rails to roll and assist to elevate trunk. Assist to bring BLEs into bed to  return to supine. + vertigo (hx of vertigo).  Transfers Overall transfer level: Needs assistance Equipment used: Rolling walker (2 wheeled) Transfers: Sit to/from Stand Sit to Stand: Max assist;+2 physical assistance         General transfer comment: Assist of 2 to attempt standing from EOB x2, however pt unable. + vertigo. + nausea. Declined transfer to chair.  Ambulation/Gait             General Gait Details: Unable.  Stairs            Wheelchair Mobility    Modified Rankin (Stroke Patients Only)       Balance Overall balance assessment: Needs assistance Sitting-balance support: Feet supported;Bilateral upper extremity supported Sitting balance-Leahy Scale: Fair Sitting balance - Comments: Requires UE support as position of comfort; total A to donn shoes. Not able to coordinate LLE to place in shoe.   Standing balance support: During functional activity Standing balance-Leahy Scale: Zero Standing balance comment: Unable to stand with Max A of 2.                             Pertinent Vitals/Pain Pain Assessment: Faces Faces Pain Scale: Hurts even more Pain Location: back and LLE Pain Descriptors / Indicators: Sore;Operative site guarding;Aching Pain Intervention(s): Repositioned;Monitored during session;Limited activity within patient's tolerance    Home Living Family/patient expects to be discharged to:: Skilled nursing facility Living Arrangements: Alone   Type of Home: Independent living facility Home Access: Level entry     Home Layout: One level Home Equipment: Walker - 2 wheels;Cane - quad;Bedside  commode;Walker - standard;Walker - 4 wheels;Wheelchair - manual Additional Comments: Not the best historian.    Prior Function Level of Independence: Needs assistance   Gait / Transfers Assistance Needed: Has not been walking the last week or 2 due to pain in back/legs. has needed help with transfers to w/c and to bathroom doing a SPT  with RW. Prior to 2 weeks ago, pt ambulatory.  ADL's / Homemaking Assistance Needed: Prior to 2 weeks ago, pt reports independent with ADLs. Loves to ride her exercise bike.  Comments: Has needed help the last few weeks with transferring and getting to the bathroom due to back pain. Reports prior to 1 month ago, pt walking independently and caring for self. Does not do meals.     Hand Dominance   Dominant Hand: Right    Extremity/Trunk Assessment   Upper Extremity Assessment Upper Extremity Assessment: Defer to OT evaluation    Lower Extremity Assessment Lower Extremity Assessment: Generalized weakness;LLE deficits/detail LLE Deficits / Details: Grossly ~2+-3/5 throughout with numbness in foot. LLE Sensation: decreased light touch;decreased proprioception LLE Coordination: decreased gross motor;decreased fine motor    Cervical / Trunk Assessment Cervical / Trunk Assessment: Other exceptions Cervical / Trunk Exceptions: s/p spine surgery  Communication   Communication: No difficulties  Cognition Arousal/Alertness: Awake/alert Behavior During Therapy: WFL for tasks assessed/performed Overall Cognitive Status: No family/caregiver present to determine baseline cognitive functioning Area of Impairment: Attention;Memory;Problem solving                   Current Attention Level: Sustained Memory: Decreased short-term memory       Problem Solving: Slow processing;Requires verbal cues General Comments: Pt tangential in speech and forgets words or what she is trying to say. "I just can't think with all that beeping." Not answering questions directly. Spent 10-12 minutes on phone trying to order breakfast but not able to remember/focus on what she wanted.      General Comments General comments (skin integrity, edema, etc.): VSS throughout. Reports hx of vertigo that was under control PTA- takes vertigo X at home.    Exercises     Assessment/Plan    PT Assessment  Patient needs continued PT services  PT Problem List Decreased strength;Decreased mobility;Decreased knowledge of precautions;Decreased coordination;Decreased activity tolerance;Decreased cognition;Pain;Impaired sensation;Decreased balance;Decreased skin integrity       PT Treatment Interventions Therapeutic activities;Gait training;Therapeutic exercise;Patient/family education;Balance training;Neuromuscular re-education;Functional mobility training;Wheelchair mobility training;DME instruction;Cognitive remediation    PT Goals (Current goals can be found in the Care Plan section)  Acute Rehab PT Goals Patient Stated Goal: to get better, "i try to do as much a I can." PT Goal Formulation: With patient Time For Goal Achievement: 06/07/19 Potential to Achieve Goals: Good    Frequency Min 5X/week   Barriers to discharge Decreased caregiver support      Co-evaluation               AM-PAC PT "6 Clicks" Mobility  Outcome Measure Help needed turning from your back to your side while in a flat bed without using bedrails?: A Little Help needed moving from lying on your back to sitting on the side of a flat bed without using bedrails?: A Little Help needed moving to and from a bed to a chair (including a wheelchair)?: Total Help needed standing up from a chair using your arms (e.g., wheelchair or bedside chair)?: Total Help needed to walk in hospital room?: Total Help needed climbing 3-5 steps with a railing? : Total  6 Click Score: 10    End of Session Equipment Utilized During Treatment: Gait belt Activity Tolerance: Other (comment)(vertigo, nausea) Patient left: in bed;with call bell/phone within reach;with bed alarm set Nurse Communication: Mobility status PT Visit Diagnosis: Pain;Dizziness and giddiness (R42);Muscle weakness (generalized) (M62.81);Difficulty in walking, not elsewhere classified (R26.2) Pain - Right/Left: Left Pain - part of body: Leg(back)    Time:  1281-1886 PT Time Calculation (min) (ACUTE ONLY): 50 min   Charges:   PT Evaluation $PT Eval Moderate Complexity: 1 Mod PT Treatments $Therapeutic Activity: 23-37 mins        Wray Kearns, Melenda, DPT Acute Rehabilitation Services Pager [phone] Office [place] 05/24/2019, 10:42 AM

## 2019-05-25 MED ORDER — HEPARIN SODIUM (PORCINE) 5000 UNIT/ML IJ SOLN
5000.0000 [IU] | Freq: Three times a day (TID) | INTRAMUSCULAR | Status: DC
  Administered 2019-05-25 – 2019-05-28 (×9): 5000 [IU] via SUBCUTANEOUS
  Filled 2019-05-25 (×9): qty 1

## 2019-05-25 NOTE — Evaluation (Signed)
Occupational Therapy Evaluation Patient Details Name: Tara [name] MRN: [id number] DOB: Apr 07, 1936 Today's Date: 05/25/2019    History of Present Illness Patient is a 83 y/o female who presents s/p L3-4 PLIF and left sided Lumbar 4-5 Microdiscectomy. PMH includes left TKA, recurrent UTI, BPPV, HTN, stroke, depression, breast ca, dependent edema.   Clinical Impression   PTA, pt was living at an Independent living and performing ADLs and light IADL up to two weeks ago when her deficits started. Pt currently requiring Min A for UB ADLs, Max A for LB ADLs, and Mod A +2 for functional transfers. Pt presenting with decreased strength, balance, and cognition. Pt motivated to participate in therapy and return home. Pt will require further acute OT to facilitate safe dc and increase functional performance. Recommend dc to CIR for intensive OT to optimize safety and independence with ADLs and functional mobility.     Follow Up Recommendations  CIR;Supervision/Assistance - 24 hour    Equipment Recommendations  Other (comment)(Defer to next venue)    Recommendations for Other Services PT consult;Rehab consult     Precautions / Restrictions Precautions Precautions: Back Precaution Comments: Pt not recalling back precautions. Reviewed back precautions Restrictions Weight Bearing Restrictions: No      Mobility Bed Mobility Overal bed mobility: Needs Assistance Bed Mobility: Rolling;Sidelying to Sit;Sit to Sidelying Rolling: Min guard Sidelying to sit: Min assist;HOB elevated       General bed mobility comments: Cues for use of bedrails. Min A to elevate trunk safety  Transfers Overall transfer level: Needs assistance Equipment used: Rolling walker (2 wheeled) Transfers: Sit to/from Stand Sit to Stand: Mod assist;+2 physical assistance;+2 safety/equipment;From elevated surface         General transfer comment: Pt requiring Mod A +2 to power up into standing from EOB with use of  stedy. Requiring heavy Mod A +2 to perform sit<>stand from lower surface at recliner    Balance Overall balance assessment: Needs assistance Sitting-balance support: Feet supported;No upper extremity supported Sitting balance-Leahy Scale: Fair     Standing balance support: During functional activity Standing balance-Leahy Scale: Poor Standing balance comment: Reliant on physical A                           ADL either performed or assessed with clinical judgement   ADL Overall ADL's : Needs assistance/impaired Eating/Feeding: Set up;Supervision/ safety;Sitting   Grooming: Brushing hair;Set up;Supervision/safety;Sitting(Applying makeup)   Upper Body Bathing: Minimal assistance;Sitting   Lower Body Bathing: Maximal assistance;Sit to/from stand   Upper Body Dressing : Minimal assistance;Sitting   Lower Body Dressing: Maximal assistance;Sit to/from stand Lower Body Dressing Details (indicate cue type and reason): Unable to bring ankles to knees Toilet Transfer: Moderate assistance;+2 for physical assistance;+2 for safety/equipment(sit<>stand with stedy)           Functional mobility during ADLs: Moderate assistance;+2 for physical assistance;+2 for safety/equipment;Rolling walker(sit<>stand with stedy and then RW) General ADL Comments: Pt presenting with decreased balance, strength, and cognition. Motivated to participate in therapy     Vision         Perception     Praxis      Pertinent Vitals/Pain Pain Assessment: Faces Faces Pain Scale: Hurts little more Pain Location: back and LLE Pain Descriptors / Indicators: Sore;Operative site guarding;Aching Pain Intervention(s): Monitored during session;Limited activity within patient's tolerance;Repositioned     Hand Dominance Right   Extremity/Trunk Assessment Upper Extremity Assessment Upper Extremity Assessment: Generalized weakness  Lower Extremity Assessment Lower Extremity Assessment: Defer to PT  evaluation LLE Deficits / Details: Grossly ~2+-3/5 throughout with numbness in foot. LLE Sensation: decreased light touch;decreased proprioception LLE Coordination: decreased gross motor;decreased fine motor   Cervical / Trunk Assessment Cervical / Trunk Assessment: Other exceptions Cervical / Trunk Exceptions: s/p spine surgery   Communication Communication Communication: No difficulties   Cognition Arousal/Alertness: Awake/alert Behavior During Therapy: WFL for tasks assessed/performed Overall Cognitive Status: Impaired/Different from baseline Area of Impairment: Attention;Memory;Problem solving                   Current Attention Level: Sustained Memory: Decreased short-term memory       Problem Solving: Slow processing;Requires verbal cues General Comments: Pt not recalling back precautions. Able to recall PT session yesterday. Continues to present with decreased attention but easily redirected to task.    General Comments  VSS    Exercises     Shoulder Instructions      Home Living Family/patient expects to be discharged to:: Inpatient rehab(Independent living; [facility]) Living Arrangements: Alone   Type of Home: Independent living facility Home Access: Level entry     Home Layout: One level     Bathroom Shower/Tub: Occupational psychologist: Handicapped height Bathroom Accessibility: Yes   Home Equipment: Environmental consultant - 2 wheels;Cane - quad;Bedside commode;Walker - standard;Walker - 4 wheels;Wheelchair - manual          Prior Functioning/Environment Level of Independence: Needs assistance  Gait / Transfers Assistance Needed: Has not been walking the last week or 2 due to pain in back/legs. has needed help with transfers to w/c and to bathroom doing a SPT with RW. Prior to 2 weeks ago, pt ambulatory. ADL's / Homemaking Assistance Needed: Prior to 2 weeks ago, pt reports independent with ADLs. Loves to ride her exercise bike. Also performing  light ADLs   Comments: Has needed help the last few weeks with transferring and getting to the bathroom due to back pain. Reports prior to 1 month ago, pt walking independently and caring for self. Does not do meals.        OT Problem List: Decreased strength;Decreased range of motion;Decreased activity tolerance;Impaired balance (sitting and/or standing);Decreased knowledge of use of DME or AE;Decreased knowledge of precautions;Pain      OT Treatment/Interventions: Self-care/ADL training;Therapeutic exercise;Energy conservation;DME and/or AE instruction;Therapeutic activities;Patient/family education    OT Goals(Current goals can be found in the care plan section) Acute Rehab OT Goals Patient Stated Goal: to get better, "i try to do as much a I can." OT Goal Formulation: With patient Time For Goal Achievement: 06/08/19 Potential to Achieve Goals: Good  OT Frequency: Min 2X/week   Barriers to D/C:            Co-evaluation PT/OT/SLP Co-Evaluation/Treatment: Yes Reason for Co-Treatment: For patient/therapist safety;To address functional/ADL transfers   OT goals addressed during session: ADL's and self-care      AM-PAC OT "6 Clicks" Daily Activity     Outcome Measure Help from another person eating meals?: None Help from another person taking care of personal grooming?: A Little Help from another person toileting, which includes using toliet, bedpan, or urinal?: A Lot Help from another person bathing (including washing, rinsing, drying)?: A Lot Help from another person to put on and taking off regular upper body clothing?: A Little Help from another person to put on and taking off regular lower body clothing?: A Lot 6 Click Score: 16   End of  Session Equipment Utilized During Treatment: Gait belt;Rolling walker Nurse Communication: Mobility status  Activity Tolerance: Patient tolerated treatment well Patient left: in chair;with call bell/phone within reach;with chair alarm  set  OT Visit Diagnosis: Unsteadiness on feet (R26.81);Other abnormalities of gait and mobility (R26.89);Muscle weakness (generalized) (M62.81);Pain Pain - part of body: (Back)                Time: 5638-9373 OT Time Calculation (min): 23 min Charges:  OT General Charges $OT Visit: 1 Visit OT Evaluation $OT Eval Moderate Complexity: [place], OTR/L Acute Rehab Pager: [phone] Office: [place] 05/25/2019, 12:39 PM

## 2019-05-25 NOTE — Plan of Care (Signed)
  Problem: Clinical Measurements: Goal: Ability to maintain clinical measurements within normal limits will improve Outcome: Progressing   Problem: Pain Managment: Goal: General experience of comfort will improve 05/25/2019 2056 by Irish Lack, RN Outcome: Progressing 05/25/2019 2056 by Irish Lack, RN Outcome: Progressing   Problem: Activity: Goal: Risk for activity intolerance will decrease 05/25/2019 2056 by Irish Lack, RN Outcome: Not Progressing 05/25/2019 2056 by Irish Lack, RN Outcome: Not Progressing

## 2019-05-25 NOTE — Progress Notes (Addendum)
Neurosurgery Service Progress Note  Subjective: No acute events overnight, some back pain, minimal leg pain, stable numbness and weakness   Objective: Vitals:   05/24/19 2000 05/24/19 2002 05/25/19 0010 05/25/19 0406  BP: (!) 125/57  129/61 (!) 137/59  Pulse: 79  70 70  Resp:   18 18  Temp:  97.8 F (36.6 C)  97.9 F (36.6 C)  TempSrc:  Oral  Oral  SpO2: 99%  99% 96%  Weight:      Height:       Temp (24hrs), Avg:97.9 F (36.6 C), Min:97.8 F (36.6 C), Max:98 F (36.7 C)  CBC Latest Ref Rng & Units 05/23/2019 04/26/2018 12/26/2017  WBC 4.0 - 10.5 K/uL 11.6(H) 7.3 14.9(H)  Hemoglobin 12.0 - 15.0 g/dL 14.2 13.6 12.4  Hematocrit 36.0 - 46.0 % 42.1 39.8 37.3  Platelets 150 - 400 K/uL 222 270 280   BMP Latest Ref Rng & Units 05/23/2019 04/26/2018 12/26/2017  Glucose 70 - 99 mg/dL 111(H) 89 128(H)  BUN 8 - 23 mg/dL 13 11 12   Creatinine 0.44 - 1.00 mg/dL 0.72 0.76 1.06(H)  BUN/Creat Ratio 6 - 22 (calc) - NOT APPLICABLE -  Sodium 867 - 145 mmol/L 142 142 138  Potassium 3.5 - 5.1 mmol/L 3.2(L) 4.1 3.6  Chloride 98 - 111 mmol/L 102 104 99(L)  CO2 22 - 32 mmol/L 30 27 24   Calcium 8.9 - 10.3 mg/dL 9.0 9.6 8.8(L)    Intake/Output Summary (Last 24 hours) at 05/25/2019 1033 Last data filed at 05/25/2019 0100 Gross per 24 hour  Intake 10.52 ml  Output 825 ml  Net -814.48 ml    Current Facility-Administered Medications:  .  0.9 %  sodium chloride infusion, 250 mL, Intravenous, Continuous, Kary Kos, MD, Last Rate: 1 mL/hr at 05/23/19 2301, 250 mL at 05/23/19 2301 .  acetaminophen (TYLENOL) tablet 650 mg, 650 mg, Oral, Q4H PRN **OR** acetaminophen (TYLENOL) suppository 650 mg, 650 mg, Rectal, Q4H PRN, Kary Kos, MD .  alum & mag hydroxide-simeth (MAALOX/MYLANTA) 200-200-20 MG/5ML suspension 30 mL, 30 mL, Oral, Q6H PRN, Kary Kos, MD .  amLODipine (NORVASC) tablet 5 mg, 5 mg, Oral, Daily, Kary Kos, MD, 5 mg at 05/24/19 0805 .  aspirin tablet 325 mg, 325 mg, Oral, Daily, Kary Kos, MD, 325 mg  at 05/24/19 0805 .  atorvastatin (LIPITOR) tablet 40 mg, 40 mg, Oral, Daily, Kary Kos, MD, 40 mg at 05/24/19 0805 .  bupivacaine liposome (EXPAREL) 1.3 % injection 266 mg, 20 mL, Infiltration, Once, Kary Kos, MD .  cholecalciferol (VITAMIN D3) tablet 2,000 Units, 2,000 Units, Oral, Daily, Kary Kos, MD, 2,000 Units at 05/24/19 0804 .  cyclobenzaprine (FLEXERIL) tablet 10 mg, 10 mg, Oral, TID PRN, Kary Kos, MD, 10 mg at 05/24/19 1406 .  diazepam (VALIUM) tablet 5 mg, 5 mg, Oral, BID PRN, Kary Kos, MD .  docusate sodium (COLACE) capsule 100 mg, 100 mg, Oral, Daily PRN, Costella, Vincent J, PA-C, 100 mg at 05/24/19 1952 .  feeding supplement (BOOST / RESOURCE BREEZE) liquid 1 Container, 1 Container, Oral, BID BM, Kary Kos, MD, 1 Container at 05/24/19 1522 .  HYDROmorphone (DILAUDID) injection 0.5 mg, 0.5 mg, Intravenous, Q2H PRN, Kary Kos, MD .  levothyroxine (SYNTHROID) tablet 75 mcg, 75 mcg, Oral, QAC breakfast, Kary Kos, MD, 75 mcg at 05/25/19 [phone] .  losartan (COZAAR) tablet 50 mg, 50 mg, Oral, Daily, Kary Kos, MD, 50 mg at 05/24/19 0805 .  menthol-cetylpyridinium (CEPACOL) lozenge 3 mg, 1 lozenge, Oral, PRN **OR**  phenol (CHLORASEPTIC) mouth spray 1 spray, 1 spray, Mouth/Throat, PRN, Kary Kos, MD .  ondansetron [facility]) tablet 4 mg, 4 mg, Oral, Q6H PRN, 4 mg at 05/24/19 0927 **OR** ondansetron (ZOFRAN) injection 4 mg, 4 mg, Intravenous, Q6H PRN, Kary Kos, MD .  oxyCODONE (Oxy IR/ROXICODONE) immediate release tablet 10 mg, 10 mg, Oral, Q3H PRN, Kary Kos, MD, 10 mg at 05/24/19 1952 .  pantoprazole (PROTONIX) EC tablet 40 mg, 40 mg, Oral, QHS, Kary Kos, MD, 40 mg at 05/24/19 2000 .  polyethylene glycol (MIRALAX / GLYCOLAX) packet 17 g, 17 g, Oral, Daily PRN, Costella, Vincent J, PA-C .  sodium chloride flush (NS) 0.9 % injection 3 mL, 3 mL, Intravenous, Q12H, Kary Kos, MD, 3 mL at 05/24/19 2000 .  sodium chloride flush (NS) 0.9 % injection 3 mL, 3 mL, Intravenous, PRN,  Kary Kos, MD   Physical Exam: AOx3, PERRL, EOMI, FS, Strength 5/5 except for 3/5 in distal LLE with some non-dermatomal numbness in LLE > RLE - pt is a very difficult exam, unable to tell me what is new / different / better / where pain / numbness are localized  Assessment & Plan: 83 y.o. woman s/p L3-4 and L4-5 decompression with L3-4 PLIF, recovering well.  -PT re-eval today -Ox3 but son stated she seemed a little more confused than usual, likely a component of post-op delirium, minimize risk factors as able -SCDs/TEDs/SQH -drain w/ 125cc first shift then no output, will likely d/c tomorrow  Tara [name]  05/25/19 10:33 AM

## 2019-05-25 NOTE — Progress Notes (Addendum)
Physical Therapy Treatment Patient Details Name: Tara [name] MRN: [id number] DOB: 1936/07/04 Today's Date: 05/25/2019    History of Present Illness Patient is a 83 y/o female who presents s/p L3-4 PLIF and left sided Lumbar 4-5 Microdiscectomy. PMH includes left TKA, recurrent UTI, BPPV, HTN, stroke, depression, breast ca, dependent edema.    PT Comments    Patient less confused today but still exhibits poor attention. Less assist needed for bed mobility. Tolerated standing with Mod A- Max A of 2 depending on the surface. Used stedy to transfer to a chair with assist of 2. Pt demonstrates marked weakness in BLEs and posterior lean in standing. Reviewed back precautions. Due to improvement in cognition and overall mobility, discharge recommendation updated to CIR. Pt highly motivated to regain independence. Will follow acutely.   Follow Up Recommendations  Supervision for mobility/OOB;Supervision/Assistance - 24 hour;CIR     Equipment Recommendations  None recommended by PT    Recommendations for Other Services       Precautions / Restrictions Precautions Precautions: Back Precaution Booklet Issued: No Precaution Comments: Pt not recalling back precautions. Reviewed back precautions Restrictions Weight Bearing Restrictions: No    Mobility  Bed Mobility Overal bed mobility: Needs Assistance Bed Mobility: Rolling;Sidelying to Sit Rolling: Min guard Sidelying to sit: Min assist;HOB elevated       General bed mobility comments: Cues for use of bedrails. Min A to elevate trunk safely.  Transfers Overall transfer level: Needs assistance Equipment used: Rolling walker (2 wheeled);Ambulation equipment used Transfers: Sit to/from Stand Sit to Stand: Mod assist;+2 physical assistance;+2 safety/equipment;From elevated surface;Max assist         General transfer comment: Pt requiring a heavy Mod A +2 to power up into standing from EOB with use of stedy. Cues for technique  and use of UEs to pull up. Stood from [employer]. Requiring Max A +2 to perform sit<>stand from lower surface at recliner, cues for hand placement and posterior lean with cues for upright.  Ambulation/Gait                 Stairs             Wheelchair Mobility    Modified Rankin (Stroke Patients Only)       Balance Overall balance assessment: Needs assistance Sitting-balance support: Feet supported;No upper extremity supported Sitting balance-Leahy Scale: Fair Sitting balance - Comments: Able to sit in chair and perform ADL tasks without difficulty and reaching outside BoS wtihout LOB.   Standing balance support: During functional activity Standing balance-Leahy Scale: Poor Standing balance comment: Reliant on physical A                            Cognition Arousal/Alertness: Awake/alert Behavior During Therapy: WFL for tasks assessed/performed Overall Cognitive Status: Impaired/Different from baseline Area of Impairment: Attention;Memory;Problem solving                   Current Attention Level: Sustained Memory: Decreased short-term memory       Problem Solving: Slow processing;Requires verbal cues General Comments: Pt not recalling back precautions. Able to recall PT session yesterday. Continues to present with decreased attention but easily redirected to task.       Exercises      General Comments General comments (skin integrity, edema, etc.): VSS      Pertinent Vitals/Pain Pain Assessment: Faces Faces Pain Scale: Hurts little more Pain Location: back and LLE Pain Descriptors /  Indicators: Sore;Operative site guarding;Aching Pain Intervention(s): Repositioned;Monitored during session;Limited activity within patient's tolerance    Home Living Family/patient expects to be discharged to:: Inpatient rehab(Independent living; [facility]) Living Arrangements: Alone   Type of Home: Independent living facility Home Access:  Level entry   Home Layout: One level Home Equipment: Environmental consultant - 2 wheels;Cane - quad;Bedside commode;Walker - standard;Walker - 4 wheels;Wheelchair - manual      Prior Function Level of Independence: Needs assistance  Gait / Transfers Assistance Needed: Has not been walking the last week or 2 due to pain in back/legs. has needed help with transfers to w/c and to bathroom doing a SPT with RW. Prior to 2 weeks ago, pt ambulatory. ADL's / Homemaking Assistance Needed: Prior to 2 weeks ago, pt reports independent with ADLs. Loves to ride her exercise bike. Also performing light ADLs Comments: Has needed help the last few weeks with transferring and getting to the bathroom due to back pain. Reports prior to 1 month ago, pt walking independently and caring for self. Does not do meals.   PT Goals (current goals can now be found in the care plan section) Acute Rehab PT Goals Patient Stated Goal: to get better, "i try to do as much a I can." Progress towards PT goals: Progressing toward goals    Frequency    Min 5X/week      PT Plan Discharge plan needs to be updated    Co-evaluation PT/OT/SLP Co-Evaluation/Treatment: Yes Reason for Co-Treatment: To address functional/ADL transfers;For patient/therapist safety PT goals addressed during session: Mobility/safety with mobility OT goals addressed during session: ADL's and self-care      AM-PAC PT "6 Clicks" Mobility   Outcome Measure  Help needed turning from your back to your side while in a flat bed without using bedrails?: A Little Help needed moving from lying on your back to sitting on the side of a flat bed without using bedrails?: A Little Help needed moving to and from a bed to a chair (including a wheelchair)?: A Lot Help needed standing up from a chair using your arms (e.g., wheelchair or bedside chair)?: A Lot Help needed to walk in hospital room?: Total Help needed climbing 3-5 steps with a railing? : Total 6 Click Score:  12    End of Session Equipment Utilized During Treatment: Gait belt Activity Tolerance: Patient tolerated treatment well Patient left: in chair;with call bell/phone within reach;with chair alarm set Nurse Communication: Mobility status PT Visit Diagnosis: Pain;Dizziness and giddiness (R42);Muscle weakness (generalized) (M62.81);Difficulty in walking, not elsewhere classified (R26.2) Pain - Right/Left: Left Pain - part of body: Leg(back)     Time: [id number] PT Time Calculation (min) (ACUTE ONLY): 22 min  Charges:  $Therapeutic Activity: 8-22 mins                     Wray Kearns, PT, DPT Acute Rehabilitation Services Pager [phone] Office [place] 05/25/2019, 12:53 PM

## 2019-05-25 NOTE — Progress Notes (Signed)
Rehab Admissions Coordinator Note:  Patient was screened by Cleatrice Burke for appropriateness for an Inpatient Acute Rehab Consult per PT and OT recommendations and family preference.   At this time, we are recommending Inpatient Rehab consult. Please place order for consult.  Cleatrice Burke RN MSN 05/25/2019, 4:30 PM  I can be reached at [phone].

## 2019-05-25 NOTE — TOC Progression Note (Signed)
Transition of Care [facility]) - Progression Note    Patient Details  Name: Tara [name] MRN: [id number] Date of Birth: 20-Feb-1936  Transition of Care [facility]) CM/SW [place], LCSW Phone Number: 05/25/2019, 10:28 AM  Clinical Narrative:  CSW spoke with patient and her son, Jeani Hawking, regarding discharge disposition. CSW notes patient and Jeani Hawking both do not feel like [place] would be a good option with patient's limited mobility. CSW notes they requested [facility]. CSW informed them it was not recommended at this time by PT. CSW noted they were adament for this program. CSW notes they were open to SNF's once that gets ruled-out    Expected Discharge Plan: Skilled Nursing Facility(vs [facility]) Barriers to Discharge: Continued Medical Work up  Expected Discharge Plan and Services Expected Discharge Plan: Skilled Nursing Facility(vs [facility]) In-house Referral: Clinical Social Work Discharge Planning Services: CM Consult Post Acute Care Choice: [place] Living arrangements for the past 2 months: Apartment                                       Social Determinants of Health (SDOH) Interventions    Readmission Risk Interventions No flowsheet data found.

## 2019-05-25 NOTE — Progress Notes (Signed)
Patient only oriented to self, confused, took off pulse ox, o2 , gown and trying to get out of bed. Patient needs stedy, 2 person assist absolutely not enough. BLE very, very weak. Bladder scan completed- 461 mL. In and Out cath completed, 550 ml removed from bladder.

## 2019-05-25 NOTE — Plan of Care (Signed)
  Problem: Education: Goal: Knowledge of General Education information will improve Description: Including pain rating scale, medication(s)/side effects and non-pharmacologic comfort measures Outcome: Progressing   Problem: Health Behavior/Discharge Planning: Goal: Ability to manage health-related needs will improve Outcome: Progressing   Problem: Clinical Measurements: Goal: Ability to maintain clinical measurements within normal limits will improve Outcome: Progressing   Problem: Clinical Measurements: Goal: Will remain free from infection Outcome: Progressing   Problem: Clinical Measurements: Goal: Diagnostic test results will improve Outcome: Progressing   Problem: Clinical Measurements: Goal: Respiratory complications will improve Outcome: Progressing   Problem: Clinical Measurements: Goal: Cardiovascular complication will be avoided Outcome: Progressing   Problem: Coping: Goal: Level of anxiety will decrease Outcome: Progressing

## 2019-05-26 NOTE — Plan of Care (Signed)
Progressing post surgically- mobilization with 2 person assist and stedy, recommendation for inpatient rehabilitation- they will evaluate  has some underlying forgetfulness, repetitive verbalizations.  Continue monitor I&O  Skin integrity- has  some edema in extremities Increase mobilization- PT and OT consulted

## 2019-05-26 NOTE — Progress Notes (Signed)
Neurosurgery Service Progress Note  Subjective: No acute events overnight, pain significantly improved, mobility improving   Objective: Vitals:   05/25/19 1628 05/25/19 2040 05/26/19 0117 05/26/19 0412  BP: (!) 125/57 (!) 133/54 (!) 129/54 (!) 136/56  Pulse: 100 72 72 68  Resp: 12 18 (!) 8 16  Temp: 98.4 F (36.9 C) 98.2 F (36.8 C) (!) 97.4 F (36.3 C) 97.8 F (36.6 C)  TempSrc: Oral Oral Oral Oral  SpO2: 100% 100% 96% 95%  Weight:      Height:       Temp (24hrs), Avg:98.2 F (36.8 C), Min:97.4 F (36.3 C), Max:99.3 F (37.4 C)  CBC Latest Ref Rng & Units 05/23/2019 04/26/2018 12/26/2017  WBC 4.0 - 10.5 K/uL 11.6(H) 7.3 14.9(H)  Hemoglobin 12.0 - 15.0 g/dL 14.2 13.6 12.4  Hematocrit 36.0 - 46.0 % 42.1 39.8 37.3  Platelets 150 - 400 K/uL 222 270 280   BMP Latest Ref Rng & Units 05/23/2019 04/26/2018 12/26/2017  Glucose 70 - 99 mg/dL 111(H) 89 128(H)  BUN 8 - 23 mg/dL 13 11 12   Creatinine 0.44 - 1.00 mg/dL 0.72 0.76 1.06(H)  BUN/Creat Ratio 6 - 22 (calc) - NOT APPLICABLE -  Sodium 025 - 145 mmol/L 142 142 138  Potassium 3.5 - 5.1 mmol/L 3.2(L) 4.1 3.6  Chloride 98 - 111 mmol/L 102 104 99(L)  CO2 22 - 32 mmol/L 30 27 24   Calcium 8.9 - 10.3 mg/dL 9.0 9.6 8.8(L)    Intake/Output Summary (Last 24 hours) at 05/26/2019 0556 Last data filed at 05/26/2019 0300 Gross per 24 hour  Intake -  Output 675 ml  Net -675 ml    Current Facility-Administered Medications:  .  0.9 %  sodium chloride infusion, 250 mL, Intravenous, Continuous, Kary Kos, MD, Last Rate: 1 mL/hr at 05/23/19 2301, 250 mL at 05/23/19 2301 .  acetaminophen (TYLENOL) tablet 650 mg, 650 mg, Oral, Q4H PRN, 650 mg at 05/25/19 1113 **OR** acetaminophen (TYLENOL) suppository 650 mg, 650 mg, Rectal, Q4H PRN, Kary Kos, MD .  alum & mag hydroxide-simeth (MAALOX/MYLANTA) 200-200-20 MG/5ML suspension 30 mL, 30 mL, Oral, Q6H PRN, Kary Kos, MD .  amLODipine (NORVASC) tablet 5 mg, 5 mg, Oral, Daily, Kary Kos, MD, 5 mg at  05/25/19 1109 .  aspirin tablet 325 mg, 325 mg, Oral, Daily, Kary Kos, MD, 325 mg at 05/25/19 1105 .  atorvastatin (LIPITOR) tablet 40 mg, 40 mg, Oral, Daily, Kary Kos, MD, 40 mg at 05/25/19 1105 .  bupivacaine liposome (EXPAREL) 1.3 % injection 266 mg, 20 mL, Infiltration, Once, Kary Kos, MD .  cholecalciferol (VITAMIN D3) tablet 2,000 Units, 2,000 Units, Oral, Daily, Kary Kos, MD, 2,000 Units at 05/25/19 1105 .  cyclobenzaprine (FLEXERIL) tablet 10 mg, 10 mg, Oral, TID PRN, Kary Kos, MD, 10 mg at 05/24/19 1406 .  diazepam (VALIUM) tablet 5 mg, 5 mg, Oral, BID PRN, Kary Kos, MD .  docusate sodium (COLACE) capsule 100 mg, 100 mg, Oral, Daily PRN, Costella, Vincent J, PA-C, 100 mg at 05/24/19 1952 .  feeding supplement (BOOST / RESOURCE BREEZE) liquid 1 Container, 1 Container, Oral, BID BM, Kary Kos, MD, 1 Container at 05/25/19 1346 .  heparin injection 5,000 Units, 5,000 Units, Subcutaneous, Q8H, Judith Part, MD, 5,000 Units at 05/25/19 2109 .  HYDROmorphone (DILAUDID) injection 0.5 mg, 0.5 mg, Intravenous, Q2H PRN, Kary Kos, MD .  levothyroxine (SYNTHROID) tablet 75 mcg, 75 mcg, Oral, QAC breakfast, Kary Kos, MD, 75 mcg at 05/25/19 [phone] .  losartan (  COZAAR) tablet 50 mg, 50 mg, Oral, Daily, Kary Kos, MD, 50 mg at 05/25/19 1108 .  menthol-cetylpyridinium (CEPACOL) lozenge 3 mg, 1 lozenge, Oral, PRN **OR** phenol (CHLORASEPTIC) mouth spray 1 spray, 1 spray, Mouth/Throat, PRN, Kary Kos, MD .  ondansetron [facility]) tablet 4 mg, 4 mg, Oral, Q6H PRN, 4 mg at 05/24/19 0927 **OR** ondansetron (ZOFRAN) injection 4 mg, 4 mg, Intravenous, Q6H PRN, Kary Kos, MD .  oxyCODONE (Oxy IR/ROXICODONE) immediate release tablet 10 mg, 10 mg, Oral, Q3H PRN, Kary Kos, MD, 10 mg at 05/25/19 2110 .  pantoprazole (PROTONIX) EC tablet 40 mg, 40 mg, Oral, QHS, Kary Kos, MD, 40 mg at 05/25/19 2109 .  polyethylene glycol (MIRALAX / GLYCOLAX) packet 17 g, 17 g, Oral, Daily PRN, Costella, Vincent  J, PA-C .  sodium chloride flush (NS) 0.9 % injection 3 mL, 3 mL, Intravenous, Q12H, Kary Kos, MD, 3 mL at 05/25/19 1105 .  sodium chloride flush (NS) 0.9 % injection 3 mL, 3 mL, Intravenous, PRN, Kary Kos, MD   Physical Exam: AOx3, PERRL, EOMI, FS, Strength 5/5 except for 4+/5 in distal LLE   Assessment & Plan: 83 y.o. woman s/p L3-4 and L4-5 decompression with L3-4 PLIF, recovering well.  -OT re-eval rec'd CIR, PM&R c/s'd for further consideration -delirium improving -SCDs/TEDs/SQH -drain w/ 100cc in past 24h, d/c drain today  Judith Part  05/26/19 5:56 AM

## 2019-05-27 NOTE — TOC Progression Note (Signed)
Transition of Care [facility]) - Progression Note    Patient Details  Name: Tara [name] MRN: [id number] Date of Birth: 11-05-1936  Transition of Care [facility]) CM/SW [place], [place] Phone Number: 05/27/2019, 1:45 PM  Clinical Narrative:    CSW spoke with CIR admissions, they are following for admission pending bed availability.  Following.    Expected Discharge Plan: Skilled Nursing Facility(vs [facility]) Barriers to Discharge: Continued Medical Work up  Expected Discharge Plan and Services Expected Discharge Plan: Skilled Nursing Facility(vs [facility]) In-house Referral: Clinical Social Work Discharge Planning Services: CM Consult Post Acute Care Choice: [place] Living arrangements for the past 2 months: Apartment  Social Determinants of Health (SDOH) Interventions    Readmission Risk Interventions No flowsheet data found.

## 2019-05-27 NOTE — Care Management Important Message (Signed)
Important Message  Patient Details  Name: Tara [name] MRN: [id number] Date of Birth: 1936-03-07   Medicare Important Message Given:  Yes     Memory Argue 05/27/2019, 2:08 PM

## 2019-05-27 NOTE — Progress Notes (Signed)
Orthopedic Tech Progress Note Patient Details:  Tara [name] [birth year] [id number] Called [employer] for lumbar corset. Patient ID: Tara [name], female   DOB: 08-Aug-1936, 83 y.o.   MRN: [id number]   Tara [name] 05/27/2019, 2:49 PM

## 2019-05-27 NOTE — Progress Notes (Signed)
  NEUROSURGERY PROGRESS NOTE   No issues overnight. Back and leg pain significantly improved over weekend.   EXAM:  BP (!) 143/66 (BP Location: Right Arm)   Pulse 89   Temp 98.7 F (37.1 C) (Oral)   Resp 16   Ht 5\' 3"  (1.6 m)   Wt 79.4 kg   SpO2 98%   BMI 31.00 kg/m   Awake, alert, oriented  Speech fluent, appropriate  CN grossly intact  5/5 BUE/BLE x mild chronic proximal RUE and left DF weakness  IMPRESSION:  83 y.o. female s/p L3-4 PLIF, L4-5 microdisc, progressing well  PLAN: - Cont PT/OT, planning of CIR tomorrow

## 2019-05-27 NOTE — PMR Pre-admission (Signed)
PMR Admission Coordinator Pre-Admission Assessment  Patient: Tara [name] is an 83 y.o., female MRN: [id number] DOB: [birth year] Height: 5' 3"  (160 cm) Weight: 79.4 kg  Insurance Information HMO:     PPO:      PCP:      IPA:      80/20: yes     OTHER:  PRIMARY: Medicare A and B      Policy#: [id number]      Subscriber: Patient CM Name:       Phone#:      Fax#:  Pre-Cert#:       Employer:  Benefits:  Phone #: online     Name: verified eligibility online via [place] on 05/28/2019  Eff. Date: Part A effective 07/23/2003, Part B effective 12/22/2006     Deduct: $1,408      Out of Pocket Max: NA      Life Max: NA CIR: covered per Medicare guidelines once yearly deductible is met      SNF: days 1-20, 100%, days 21-100, 80% Outpatient: 80%     Co-Pay: 20% Home Health: 100%      Co-Pay:  DME: 80%     Co-Pay: 20% Providers: Pt's choice SECONDARY: BCBS      Policy#: [id number]      Subscriber: Patient CM Name:       Phone#:      Fax#:  Pre-Cert#:       Employer:  Benefits:  Phone #: [phone]     Name:  Eff. Date:      Deduct:       Out of Pocket Max:       Life Max:  CIR:       SNF:  Outpatient:      Co-Pay:  Home Health:       Co-Pay:  DME:      Co-Pay:   Medicaid Application Date:       Case Manager:  Disability Application Date:       Case Worker:   The "Data Collection Information Summary" for patients in Inpatient Rehabilitation Facilities with attached "Privacy Act [place] Records" was provided and verbally reviewed with: Patient  Emergency Contact Information Contact Information    Name Relation Home Work [place], b [place] Son   [phone]   Doyle Askew Daughter   [phone]      Current Medical History  Patient Admitting Diagnosis: Lumbar spinal stenosis s/p L3-4 PLIF, L4-5 microdiscectomy  History of Present Illness: [name] is an 83 year old female with history of hypertension, hypothyroidism,left TKA 2019, TIA maintained on  aspirin, lumbar laminectomy with decompression 2014.  Presented 05/23/2019 with low back pain radiating to the left lower extremity.  Work-up and imaging revealed critical stenosis at L3-4 with large disc radiation possible recurrence of synovial cyst with radiculopathy.  Underwent redo decompressive lumbar laminectomy L3-4 with complete medial facetectomies and radical foraminotomies of the L4 and L3 nerve roots posterior lumbar interbody fusion 05/23/2019 per Dr. Saintclair Halsted.  Hospital course pain management.  Routine back precautions with back brace when out of bed.  Subcutaneous heparin for DVT prophylaxis.  Therapy evaluation completed and patient is to be admitted for a comprehensive rehab program on 05/28/19.     Patient's medical record from [facility] has been reviewed by the rehabilitation admission coordinator and physician.  Past Medical History  Past Medical History:  Diagnosis Date  . Arthritis    knees  . Breast cancer ([place]) 09/30/15  right breast  . Dependent edema   . Depression    just lost spouse in Nov. 2018  . Essential hypertension, benign 04/15/2011  . Herniated lumbar disc without myelopathy   . Hx of staphylococcal infection   . Hypertension   . Hypothyroidism   . Obesity   . Pre-diabetes   . Radicular pain   . Recurrent UTI   . Stroke [facility])    ' mini"  . Thyroid disease    hypothyroidism  . Vertigo   . Wears contact lenses    one    Family History   family history includes Breast cancer in her sister and sister; Cancer in her brother and brother; Heart failure in her father; Stroke in her sister; Uterine cancer in her sister.  Prior Rehab/Hospitalizations Has the patient had prior rehab or hospitalizations prior to admission? No  Has the patient had major surgery during 100 days prior to admission? Yes   Current Medications  Current Facility-Administered Medications:  .  0.9 %  sodium chloride infusion, 250 mL, Intravenous, Continuous, Kary Kos, MD, Last Rate: 1 mL/hr at 05/23/19 2301, 250 mL at 05/23/19 2301 .  acetaminophen (TYLENOL) tablet 650 mg, 650 mg, Oral, Q4H PRN, 650 mg at 05/25/19 1113 **OR** acetaminophen (TYLENOL) suppository 650 mg, 650 mg, Rectal, Q4H PRN, Kary Kos, MD .  alum & mag hydroxide-simeth (MAALOX/MYLANTA) 200-200-20 MG/5ML suspension 30 mL, 30 mL, Oral, Q6H PRN, Kary Kos, MD .  amLODipine (NORVASC) tablet 5 mg, 5 mg, Oral, Daily, Kary Kos, MD, 5 mg at 05/28/19 0944 .  aspirin tablet 325 mg, 325 mg, Oral, Daily, Kary Kos, MD, 325 mg at 05/28/19 0944 .  atorvastatin (LIPITOR) tablet 40 mg, 40 mg, Oral, Daily, Kary Kos, MD, 40 mg at 05/28/19 0944 .  bupivacaine liposome (EXPAREL) 1.3 % injection 266 mg, 20 mL, Infiltration, Once, Kary Kos, MD .  cholecalciferol (VITAMIN D3) tablet 2,000 Units, 2,000 Units, Oral, Daily, Kary Kos, MD, 2,000 Units at 05/28/19 0944 .  cyclobenzaprine (FLEXERIL) tablet 10 mg, 10 mg, Oral, TID PRN, Kary Kos, MD, 10 mg at 05/28/19 0046 .  diazepam (VALIUM) tablet 5 mg, 5 mg, Oral, BID PRN, Kary Kos, MD .  docusate sodium (COLACE) capsule 100 mg, 100 mg, Oral, Daily PRN, Costella, Vista Mink, PA-C, 100 mg at 05/27/19 2102 .  feeding supplement (BOOST / RESOURCE BREEZE) liquid 1 Container, 1 Container, Oral, BID BM, Kary Kos, MD, 1 Container at 05/28/19 0944 .  heparin injection 5,000 Units, 5,000 Units, Subcutaneous, Q8H, Judith Part, MD, 5,000 Units at 05/28/19 0536 .  HYDROmorphone (DILAUDID) injection 0.5 mg, 0.5 mg, Intravenous, Q2H PRN, Kary Kos, MD .  levothyroxine (SYNTHROID) tablet 75 mcg, 75 mcg, Oral, QAC breakfast, Kary Kos, MD, 75 mcg at 05/28/19 0536 .  losartan (COZAAR) tablet 50 mg, 50 mg, Oral, Daily, Kary Kos, MD, 50 mg at 05/28/19 0944 .  menthol-cetylpyridinium (CEPACOL) lozenge 3 mg, 1 lozenge, Oral, PRN **OR** phenol (CHLORASEPTIC) mouth spray 1 spray, 1 spray, Mouth/Throat, PRN, Kary Kos, MD .  ondansetron [facility]) tablet 4 mg, 4  mg, Oral, Q6H PRN, 4 mg at 05/24/19 0927 **OR** ondansetron (ZOFRAN) injection 4 mg, 4 mg, Intravenous, Q6H PRN, Kary Kos, MD .  oxyCODONE (Oxy IR/ROXICODONE) immediate release tablet 10 mg, 10 mg, Oral, Q3H PRN, Kary Kos, MD, 10 mg at 05/28/19 0944 .  pantoprazole (PROTONIX) EC tablet 40 mg, 40 mg, Oral, QHS, Kary Kos, MD, 40 mg at 05/27/19 2102 .  polyethylene  glycol (MIRALAX / GLYCOLAX) packet 17 g, 17 g, Oral, Daily PRN, Costella, Vista Mink, PA-C, 17 g at 05/27/19 2102 .  sodium chloride flush (NS) 0.9 % injection 3 mL, 3 mL, Intravenous, Q12H, Kary Kos, MD, 3 mL at 05/28/19 0944 .  sodium chloride flush (NS) 0.9 % injection 3 mL, 3 mL, Intravenous, PRN, Kary Kos, MD  Patients Current Diet:  Diet Order            Diet regular Room service appropriate? Yes; Fluid consistency: Thin  Diet effective now              Precautions / Restrictions Precautions Precautions: Fall, Back Precaution Booklet Issued: No Precaution Comments: Pt not recalling back precautions. Reviewed back precautions Spinal Brace: Other (comment)(not available to apply due to not yet in room) Restrictions Weight Bearing Restrictions: No   Has the patient had 2 or more falls or a fall with injury in the past year? No  Prior Activity Level Limited Community (1-2x/wk): mostly stay in ILF, did activities like choir ; [place] drove PTA  Prior Functional Level Self Care: Did the patient need help bathing, dressing, using the toilet or eating? Independent  Indoor Mobility: Did the patient need assistance with walking from room to room (with or without device)? Independent  Stairs: Did the patient need assistance with internal or external stairs (with or without device)? Unknown  Functional Cognition: Did the patient need help planning regular tasks such as shopping or remembering to take medications? [place] / [place] Devices/Equipment: Kasandra Knudsen (specify quad or  straight), Environmental consultant (specify type), Wheelchair Home Equipment: Environmental consultant - 2 wheels, [employer] - quad, Bedside commode, Walker - standard, Environmental consultant - 4 wheels, Wheelchair - manual  Prior Device Use: Indicate devices/aids used by the patient prior to current illness, exacerbation or injury? 3 pronged cane (son says that made her more unstable)  Current Functional Level Cognition  Overall Cognitive Status: Impaired/Different from baseline Current Attention Level: Sustained Orientation Level: Oriented X4 General Comments: admits to memory trouble at baseline, anxious and not aware of deficits till we struggled to chair    Extremity Assessment (includes Sensation/Coordination)  Upper Extremity Assessment: Generalized weakness  Lower Extremity Assessment: Defer to PT evaluation LLE Deficits / Details: Grossly ~2+-3/5 throughout with numbness in foot. LLE Sensation: decreased light touch, decreased proprioception LLE Coordination: decreased gross motor, decreased fine motor    ADLs  Overall ADL's : Needs assistance/impaired Eating/Feeding: Set up, Supervision/ safety, Sitting Grooming: Brushing hair, Set up, Supervision/safety, Sitting(Applying makeup) Upper Body Bathing: Minimal assistance, Sitting Lower Body Bathing: Maximal assistance, Sit to/from stand Upper Body Dressing : Minimal assistance, Sitting Lower Body Dressing: Maximal assistance, Sit to/from stand Lower Body Dressing Details (indicate cue type and reason): Unable to bring ankles to knees Toilet Transfer: Moderate assistance, +2 for physical assistance, +2 for safety/equipment(sit<>stand with stedy) Functional mobility during ADLs: Moderate assistance, +2 for physical assistance, +2 for safety/equipment, Rolling walker(sit<>stand with stedy and then RW) General ADL Comments: Pt presenting with decreased balance, strength, and cognition. Motivated to participate in therapy    Mobility  Overal bed mobility: Needs Assistance Bed  Mobility: Rolling, Sidelying to Sit Rolling: Supervision Sidelying to sit: Min guard Sit to sidelying: Mod assist, HOB elevated General bed mobility comments: Cues for use of bedrails. Min A to elevate trunk safely.    Transfers  Overall transfer level: Needs assistance Equipment used: Rolling walker (2 wheeled) Transfer via Lift Equipment: Stedy Transfers: Sit to/from [employer]	[place]  Sit to Stand: Mod assist, Max assist General transfer comment: able to stand briefly, then attempting to move feet and legs buckling, pt pushing back, assisted back to EOB, attempted standing again without success, attempted squat pivot, but pt unable to power up partially on legs, used drop arm and scoot pivots with mod A increased time.    Ambulation / Gait / Stairs / Wheelchair Mobility  Ambulation/Gait General Gait Details: Unable.    Posture / Balance Dynamic Sitting Balance Sitting balance - Comments: Able to sit in chair and perform ADL tasks without difficulty and reaching outside BoS wtihout LOB. Balance Overall balance assessment: Needs assistance Sitting-balance support: Feet supported, No upper extremity supported Sitting balance-Leahy Scale: Fair Sitting balance - Comments: Able to sit in chair and perform ADL tasks without difficulty and reaching outside BoS wtihout LOB. Standing balance support: During functional activity Standing balance-Leahy Scale: Zero Standing balance comment: Reliant on physical A    Special needs/care consideration BiPAP/CPAP : no CPM : no Continuous Drip IV : no Dialysis : no        Days : no Life Vest : no Oxygen: on RA Special Bed : no Trach Size : no Wound Vac (area) : no      Location: no Skin: surgical incision to back                          Bowel mgmt: no BM charted Bladder mgmt: continent Diabetic mgmt: no Behavioral consideration  : no Chemo/radiation : not currently   Previous Home Environment (from acute therapy documentation) Living Arrangements:  Alone Type of Home: Independent living facility Home Layout: One level Home Access: Level entry Bathroom Shower/Tub: Multimedia programmer: Handicapped height Bathroom Accessibility: Yes Home Care Services: No Additional Comments: Not the best historian.  Discharge Living Setting Plans for Discharge Living Setting: Patient's home Type of Home at Discharge: [place] Name at Discharge: [facility]  Discharge Home Layout: One level Discharge Home Access: Level entry Discharge Bathroom Shower/Tub: Walk-in shower Discharge Bathroom Toilet: Handicapped height Discharge Bathroom Accessibility: Yes How Accessible: Accessible via walker Does the patient have any problems obtaining your medications?: No  Social/Family/Support Systems Patient Roles: Other (Comment)(part of ILF community; has son and daugther who are involved in care) Contact Information: son Jeani Hawking): [phone]; daughter ([name]: [phone]) Anticipated Caregiver: hired assist as needed (has used hired CNAs in past, mostly at nighttime; plan to have friend/neighbor who has assisted in the past for intemittant assist)-confirmed with son and daughter they have spoken to this friend about her plans for assist.*I personally confirmed with friend Imagene Sheller [phone]) with her intention to assist as needed, up to [place]).  Anticipated Caregiver's Contact Information: see above (lynn) able to provide information about hired assistance as needed Ability/Limitations of Caregiver: Min A Caregiver Availability: Intermittent Discharge Plan Discussed with Primary Caregiver: Yes Is Caregiver In Agreement with Plan?: Yes Does Caregiver/Family have Issues with Lodging/Transportation while Pt is in Rehab?: No  Goals/Additional Needs Patient/Family Goal for Rehab: PT/OT: Supervision/Min A; SLP: NA Expected length of stay: 10-14 days Cultural Considerations: [place] Dietary Needs:  regular, thin liquids Equipment Needs: TBD Pt/Family Agrees to Admission and willing to participate: Yes Program Orientation Provided & Reviewed with Pt/Caregiver Including Roles  & Responsibilities: Yes  Barriers to Discharge: Lack of/limited family support(only intermittant phsyical assist. )  Decrease burden of Care through IP rehab admission: NA   Possible need for  SNF placement upon discharge: Not anticipated if pt is able to get to intermittent A level. Pt has a friend who can assist with getting her to the commode with light Min A/G during the day. Pt and family understand she will also likely require hired assistance for more coverage. If pt does not progress to anticipated functional level, pt and family understand SNF placement may be sought.   Patient Condition: I have reviewed medical records from [facility], spoken with the patient, her friend who plans to assist, , and son and daughter. I met with patient at the bedside for inpatient rehabilitation assessment.  Patient will benefit from ongoing PT and OT, can actively participate in 3 hours of therapy a day 5 days of the week, and can make measurable gains during the admission.  Patient will also benefit from the coordinated team approach during an Inpatient Acute Rehabilitation admission.  The patient will receive intensive therapy as well as Rehabilitation physician, nursing, social worker, and care management interventions.  Due to bladder management, bowel management, safety, skin/wound care, disease management, medication administration, pain management and patient education the patient requires 24 hour a day rehabilitation nursing.  The patient is currently Mod/Max A with transfers and Min to Max A for basic ADLs.  Discharge setting and therapy post discharge at home with home health is anticipated.  Patient has agreed to participate in the Acute Inpatient Rehabilitation Program and will admit today.  Preadmission  Screen Completed By:  Jhonnie Garner, 05/28/2019 10:13 AM ______________________________________________________________________   Discussed status with Dr. Posey Pronto on 05/28/2019 at 9:38AM and received approval for admission today.  Admission Coordinator:  Jhonnie Garner, OT, time 9:38AM/Date 05/28/2019   Assessment/Plan: Diagnosis: Lumbar spinal stenosis s/p L3-4 PLIF, L4-5 microdiscectomy  1. Does the need for close, 24 hr/day Medical supervision in concert with the patient's rehab needs make it unreasonable for this patient to be served in a less intensive setting? Yes  2. Co-Morbidities requiring supervision/potential complications: hypertension, hypothyroidism,left TKA 2019, TIA maintained on aspirin, lumbar laminectomy with decompression 2014 3. Due to bladder management, bowel management, safety, skin/wound care, disease management, medication administration, pain management and patient education, does the patient require 24 hr/day rehab nursing? Yes 4. Does the patient require coordinated care of a physician, rehab nurse, PT (1-2 hrs/day, 5 days/week) and OT (1-2 hrs/day, 5 days/week) to address physical and functional deficits in the context of the above medical diagnosis(es)? Yes Addressing deficits in the following areas: balance, endurance, locomotion, strength, transferring, bathing, dressing, toileting and psychosocial support 5. Can the patient actively participate in an intensive therapy program of at least 3 hrs of therapy 5 days a week? Yes 6. The potential for patient to make measurable gains while on inpatient rehab is excellent 7. Anticipated functional outcomes upon discharge from inpatients are: supervision and min assist PT, supervision and min assist OT, n/a SLP 8. Estimated rehab length of stay to reach the above functional goals is: 12-16 days. 9. Anticipated D/C setting: Home 10. Anticipated post D/C treatments: HH therapy and Home excercise program 11. Overall Rehab/Functional  Prognosis: good  MD Signature: Delice Lesch, MD, ABPMR

## 2019-05-27 NOTE — Progress Notes (Signed)
Inpatient Rehab Admissions:  Inpatient Rehab Consult received.  I met with pt at the bedside for rehabilitation assessment and to discuss goals and expectations of an inpatient rehab admission.  Pt would like to be considered for CIR. Pt requesting AC speak with her son regarding caregiver assistance and see what he thinks about rehab. AC has confirmed family's willingness to hire someone for assistance and would like to pursue CIR when medically ready. AC will follow for bed availability and completion of case workup.   Jhonnie Garner, OTR/L  Rehab Admissions Coordinator  [phone] 05/27/2019 12:23 PM

## 2019-05-27 NOTE — H&P (Signed)
Physical Medicine and Rehabilitation Admission H&P    Chief complaint: Back pain HPI: Tara [name] is an 83 year old right-handed female with history of hypertension, hypothyroidism,left TKA 2019, TIA maintained on aspirin, lumbar laminectomy with decompression 2014.  History taken from chart review and patient.  Chart review, patient was alone in an independent living facility/[facility].  Patient used a walker and quad cane prior to admission.  Presented 05/23/2019 with low back pain radiating to the left lower extremity.  Work-up and imaging revealed critical stenosis L3-4  With disc herniation and possible recurrence of synovial cyst with radiculopathy.  Underwent redo decompressive lumbar laminectomy L3-4 with complete medial facetectomies and radical foraminotomies of the L4 and L3 nerve roots posterior lumbar interbody fusion 05/23/2019 per Dr. Saintclair Halsted.  Hospital course complicated by pain.  Routine back precautions with back brace when out of bed.  Subcutaneous heparin for DVT prophylaxis.  Therapy evaluation completed and patient was admitted for a comprehensive rehab program.  Review of Systems  Constitutional: Positive for malaise/fatigue. Negative for chills and fever.  HENT: Negative for hearing loss.   Eyes: Negative for blurred vision and double vision.  Respiratory: Negative for cough and shortness of breath.   Cardiovascular: Positive for leg swelling. Negative for chest pain and palpitations.  Gastrointestinal: Positive for constipation. Negative for heartburn and nausea.  Genitourinary: Positive for urgency. Negative for dysuria, flank pain and hematuria.  Musculoskeletal: Positive for back pain, joint pain and myalgias.  Skin: Negative for rash.  Neurological: Positive for dizziness, sensory change, focal weakness and weakness.  Psychiatric/Behavioral: Positive for depression. The patient has insomnia.        Anxiety  All other systems reviewed and are negative.  Past  Medical History:  Diagnosis Date  . Arthritis    knees  . Breast cancer ([place]) 09/30/15   right breast  . Dependent edema   . Depression    just lost spouse in Nov. 2018  . Essential hypertension, benign 04/15/2011  . Herniated lumbar disc without myelopathy   . Hx of staphylococcal infection   . Hypertension   . Hypothyroidism   . Obesity   . Pre-diabetes   . Radicular pain   . Recurrent UTI   . Stroke [facility])    ' mini"  . Thyroid disease    hypothyroidism  . Vertigo   . Wears contact lenses    one   Past Surgical History:  Procedure Laterality Date  . ANKLE SURGERY  1994   RIGHT  . BACK SURGERY    . BREAST LUMPECTOMY WITH NEEDLE LOCALIZATION Right 09/30/2015   Procedure: RIGHT BREAST LUMPECTOMY WITH NEEDLE LOCALIZATION;  Surgeon: Fanny Skates, MD;  Location: [place];  Service: General;  Laterality: Right;  . BREAST SURGERY    . COLONOSCOPY W/ BIOPSIES AND POLYPECTOMY    . EYE SURGERY     bilateral  . HAND SURGERY  1951   RIGHT HAND  . JOINT REPLACEMENT     rt shoulder rotator cuff  . KNEE ARTHROSCOPY     left knee  . LUMBAR LAMINECTOMY/DECOMPRESSION MICRODISCECTOMY Right 01/11/2013   Procedure: LUMBAR LAMINECTOMY/DECOMPRESSION MICRODISCECTOMY 1 LEVEL,Right Lumbar Three-Four;  Surgeon: Elaina Hoops, MD;  Location: [place] [facility];  Service: Neurosurgery;  Laterality: Right;  . ROTATOR CUFF REPAIR  2011   RIGHT  . TONSILLECTOMY    . TOTAL KNEE ARTHROPLASTY     Left  . TOTAL KNEE ARTHROPLASTY Right 12/25/2017   Procedure: TOTAL KNEE ARTHROPLASTY;  Surgeon: Vickey Huger, MD;  Location: [place];  Service: Orthopedics;  Laterality: Right;   Family History  Problem Relation Age of Onset  . Breast cancer Sister   . Stroke Sister   . Cancer Brother        Kidney  . Heart failure Father   . Uterine cancer Sister   . Breast cancer Sister        Uterine  . Cancer Brother        Lung   Social History:  reports that she has never smoked. She has never used  smokeless tobacco. She reports that she does not drink alcohol or use drugs. Allergies:  Allergies  Allergen Reactions  . Ceclor [Cefaclor] Hives and Itching   Medications Prior to Admission  Medication Sig Dispense Refill  . amLODipine (NORVASC) 5 MG tablet TAKE 1 TABLET EVERY DAY (Patient taking differently: Take 5 mg by mouth daily. ) 90 tablet 2  . aspirin 325 MG tablet Take 325 mg by mouth daily.    Marland Kitchen atorvastatin (LIPITOR) 40 MG tablet TAKE 1 TABLET EVERY DAY (Patient taking differently: Take 40 mg by mouth daily. ) 90 tablet 3  . Cholecalciferol 2000 units CAPS Take 2,000 Units by mouth daily.    . diazepam (VALIUM) 5 MG tablet 1 tablet po twice  daily as needed for anxiety (Patient taking differently: Take 5 mg by mouth 2 (two) times daily as needed for anxiety. ) 60 tablet 5  . levothyroxine (SYNTHROID, LEVOTHROID) 75 MCG tablet TAKE 1 TABLET EVERY DAY (Patient taking differently: Take 75 mcg by mouth daily before breakfast. ) 90 tablet 0  . losartan (COZAAR) 100 MG tablet TAKE 1 TABLET EVERY DAY (Patient taking differently: Take 50 mg by mouth daily. ) 90 tablet 3  . OVER THE COUNTER MEDICATION Take 1 capsule by mouth daily. Fruit Full Antioxidant Extract Supplement    . OVER THE COUNTER MEDICATION Take 1 capsule by mouth daily. Cruciferous Extract Supplement    . Oxycodone HCl 10 MG TABS Take 10 mg by mouth every 6 (six) hours as needed (for severe pain).      Drug Regimen Review Drug regimen was reviewed and remains appropriate with no significant issues identified  Home: Home Living Family/patient expects to be discharged to:: Inpatient rehab(Independent living; [facility]) Living Arrangements: Alone Type of Home: Independent living facility Home Access: Level entry Home Layout: One level Bathroom Shower/Tub: Multimedia programmer: Handicapped height Bathroom Accessibility: Yes Home Equipment: Environmental consultant - 2 wheels, Cane - quad, Bedside commode, Walker -  standard, Environmental consultant - 4 wheels, Wheelchair - manual Additional Comments: Not the best historian.   Functional History: Prior Function Level of Independence: Needs assistance Gait / Transfers Assistance Needed: Has not been walking the last week or 2 due to pain in back/legs. has needed help with transfers to w/c and to bathroom doing a SPT with RW. Prior to 2 weeks ago, pt ambulatory. ADL's / Homemaking Assistance Needed: Prior to 2 weeks ago, pt reports independent with ADLs. Loves to ride her exercise bike. Also performing light ADLs Comments: Has needed help the last few weeks with transferring and getting to the bathroom due to back pain. Reports prior to 1 month ago, pt walking independently and caring for self. Does not do meals.  Functional Status:  Mobility: Bed Mobility Overal bed mobility: Needs Assistance Bed Mobility: Rolling, Sidelying to Sit Rolling: Supervision Sidelying to sit: Min guard Sit to sidelying: Mod assist, HOB elevated General bed  mobility comments: Cues for use of bedrails. Min A to elevate trunk safely. Transfers Overall transfer level: Needs assistance Equipment used: Rolling walker (2 wheeled) Transfer via Lift Equipment: Stedy Transfers: Sit to/from Stand Sit to Stand: Mod assist, Max assist General transfer comment: able to stand briefly, then attempting to move feet and legs buckling, pt pushing back, assisted back to EOB, attempted standing again without success, attempted squat pivot, but pt unable to power up partially on legs, used drop arm and scoot pivots with mod A increased time. Ambulation/Gait General Gait Details: Unable.    ADL: ADL Overall ADL's : Needs assistance/impaired Eating/Feeding: Set up, Supervision/ safety, Sitting Grooming: Brushing hair, Set up, Supervision/safety, Sitting(Applying makeup) Upper Body Bathing: Minimal assistance, Sitting Lower Body Bathing: Maximal assistance, Sit to/from stand Upper Body Dressing : Minimal  assistance, Sitting Lower Body Dressing: Maximal assistance, Sit to/from stand Lower Body Dressing Details (indicate cue type and reason): Unable to bring ankles to knees Toilet Transfer: Moderate assistance, +2 for physical assistance, +2 for safety/equipment(sit<>stand with stedy) Functional mobility during ADLs: Moderate assistance, +2 for physical assistance, +2 for safety/equipment, Rolling walker(sit<>stand with stedy and then RW) General ADL Comments: Pt presenting with decreased balance, strength, and cognition. Motivated to participate in therapy  Cognition: Cognition Overall Cognitive Status: Impaired/Different from baseline Orientation Level: Oriented X4 Cognition Arousal/Alertness: Awake/alert Behavior During Therapy: Anxious Overall Cognitive Status: Impaired/Different from baseline Area of Impairment: Attention, Memory, Problem solving Current Attention Level: Sustained Memory: Decreased short-term memory Problem Solving: Slow processing, Requires verbal cues General Comments: admits to memory trouble at baseline, anxious and not aware of deficits till we struggled to chair  Physical Exam: Blood pressure (!) 138/59, pulse 69, temperature 97.6 F (36.4 C), temperature source Oral, resp. rate 16, height 5\' 3"  (1.6 m), weight 79.4 kg, SpO2 (!) 83 %. Physical Exam  Vitals reviewed. Constitutional: She appears well-developed.  Obese  HENT:  Head: Normocephalic and atraumatic.  Eyes: EOM are normal. Right eye exhibits no discharge. Left eye exhibits no discharge.  Respiratory: Effort normal. No respiratory distress.  GI: She exhibits no distension.  Musculoskeletal:     Comments: Lower extremity edema  Neurological: She is alert.  Patient is alert.   Follows commands.   Follows full commands. Motor: Bilateral upper extremities: 5/5 proximal distal Right lower extremity: Hip flexion, knee extension 2/5, ankle dorsiflexion 5/5 Left lower extremity: Hip flexion, knee  extension 3/5, dorsiflexion 5/5 Sensation diminished light touch left foot  Skin: Skin is warm and dry.  Psychiatric:  Slightly distracted and anxious and verbose    No results found for this or any previous visit (from the past 48 hour(s)). No results found.  Medical Problem List and Plan: 1.  Decreased functional mobility secondary to lumbar radiculopathy, HNP L3-4.  Status post redo decompressive lumbar laminectomy L3-4 posterior lumbar interbody fusion 05/23/2019.Marland Kitchen Back brace when out of bed  Admit to CIR 2.  Antithrombotics: -DVT/anticoagulation: Subcutaneous heparin.  Lower extremity Dopplers ordered  -antiplatelet therapy: Aspirin 325 mg daily 3. Pain Management: Flexeril and oxycodone as needed.  Attempt to minimize pain medications due to alterations in cognition. 4. Mood: Valium 5 mg twice daily as needed anxiety  -antipsychotic agents: N/A 5. Neuropsych: This patient is capable of making decisions on her own behalf. 6. Skin/Wound Care: Routine skin checks 7. Fluids/Electrolytes/Nutrition: Routine I/Os.  CMP ordered for tomorrow a.m. 8.  Hypertension: Cozaar 50 mg daily, Norvasc 5 mg daily.  Monitor with increased mobility. 9.  Hyperlipidemia: Continue Lipitor 10.  History of TIA.  Continue aspirin 11.  Hypothyroidism.  Synthroid 12.  Constipation.  Laxative assistance  Cathlyn Parsons, PA-C 05/28/2019

## 2019-05-27 NOTE — Progress Notes (Signed)
Physical Therapy Treatment Patient Details Name: Tara [name] MRN: [id number] DOB: 20-Nov-1936 Today's Date: 05/27/2019    History of Present Illness Patient is a 83 y/o female who presents s/p L3-4 PLIF and left sided Lumbar 4-5 Microdiscectomy. PMH includes left TKA, recurrent UTI, BPPV, HTN, stroke, depression, breast ca, dependent edema.    PT Comments    Patient able to make it to chair struggling with +1 A.  Strongest L leg has worse sensation than R.  Patient anxious, demonstrating decreased awareness with decreased memory and poor activity tolerance.  Continue to recommend CIR if has help once d/c home.    Follow Up Recommendations  Supervision for mobility/OOB;Supervision/Assistance - 24 hour;CIR     Equipment Recommendations  None recommended by PT    Recommendations for Other Services       Precautions / Restrictions Precautions Precautions: Fall;Back Required Braces or Orthoses: Spinal Brace Spinal Brace: Other (comment)(not available to apply due to not yet in room)    Mobility  Bed Mobility Overal bed mobility: Needs Assistance Bed Mobility: Rolling;Sidelying to Sit Rolling: Supervision Sidelying to sit: Min guard          Transfers Overall transfer level: Needs assistance Equipment used: Rolling walker (2 wheeled) Transfers: Sit to/from Stand Sit to Stand: Mod assist;Max assist         General transfer comment: able to stand briefly, then attempting to move feet and legs buckling, pt pushing back, assisted back to EOB, attempted standing again without success, attempted squat pivot, but pt unable to power up partially on legs, used drop arm and scoot pivots with mod A increased time.  Ambulation/Gait                 Stairs             Wheelchair Mobility    Modified Rankin (Stroke Patients Only)       Balance Overall balance assessment: Needs assistance   Sitting balance-Leahy Scale: Fair       Standing balance-Leahy  Scale: Zero                              Cognition Arousal/Alertness: Awake/alert Behavior During Therapy: Anxious Overall Cognitive Status: Impaired/Different from baseline Area of Impairment: Attention;Memory;Problem solving                   Current Attention Level: Sustained Memory: Decreased short-term memory       Problem Solving: Slow processing;Requires verbal cues General Comments: admits to memory trouble at baseline, anxious and not aware of deficits till we struggled to chair      Exercises Other Exercises Other Exercises: patient notes more numb on L LE than R LE, but strength is worse on R LE than L LE    General Comments        Pertinent Vitals/Pain Faces Pain Scale: Hurts little more Pain Location: back and LLE Pain Descriptors / Indicators: Aching Pain Intervention(s): Repositioned;Monitored during session    Home Living                      Prior Function            PT Goals (current goals can now be found in the care plan section) Progress towards PT goals: Progressing toward goals    Frequency    Min 5X/week      PT Plan Current plan remains appropriate  Co-evaluation              AM-PAC PT "6 Clicks" Mobility   Outcome Measure  Help needed turning from your back to your side while in a flat bed without using bedrails?: A Little Help needed moving from lying on your back to sitting on the side of a flat bed without using bedrails?: A Little Help needed moving to and from a bed to a chair (including a wheelchair)?: Total Help needed standing up from a chair using your arms (e.g., wheelchair or bedside chair)?: Total Help needed to walk in hospital room?: Total Help needed climbing 3-5 steps with a railing? : Total 6 Click Score: 10    End of Session Equipment Utilized During Treatment: Gait belt Activity Tolerance: Patient limited by fatigue Patient left: in chair;with chair alarm set;with call  bell/phone within reach Nurse Communication: Other (comment);Mobility status PT Visit Diagnosis: Muscle weakness (generalized) (M62.81);Other abnormalities of gait and mobility (R26.89);Other symptoms and signs involving the nervous system (R29.898);Pain Pain - Right/Left: Left Pain - part of body: Leg     Time: 1250-1325 PT Time Calculation (min) (ACUTE ONLY): 35 min  Charges:  $Therapeutic Activity: 23-37 mins                     Tara [name], Tara [name] [facility] [phone] 05/27/2019    Tara [name] 05/27/2019, 1:35 PM

## 2019-05-28 ENCOUNTER — Inpatient Hospital Stay (HOSPITAL_COMMUNITY): Payer: Medicare Other

## 2019-05-28 ENCOUNTER — Inpatient Hospital Stay (HOSPITAL_COMMUNITY)
Admission: RE | Admit: 2019-05-28 | Discharge: 2019-06-19 | DRG: 560 | Disposition: A | Discharge status: Home Health | Payer: Medicare Other | Source: Intra-hospital | Attending: Physical Medicine & Rehabilitation | Admitting: Physical Medicine & Rehabilitation

## 2019-05-28 ENCOUNTER — Other Ambulatory Visit: Payer: Self-pay

## 2019-05-28 ENCOUNTER — Encounter (HOSPITAL_COMMUNITY): Payer: Self-pay | Admitting: *Deleted

## 2019-05-28 DIAGNOSIS — G8918 Other acute postprocedural pain: Secondary | ICD-10-CM

## 2019-05-28 DIAGNOSIS — I82452 Acute embolism and thrombosis of left peroneal vein: Secondary | ICD-10-CM | POA: Diagnosis present

## 2019-05-28 DIAGNOSIS — R42 Dizziness and giddiness: Secondary | ICD-10-CM | POA: Diagnosis not present

## 2019-05-28 DIAGNOSIS — Z981 Arthrodesis status: Secondary | ICD-10-CM | POA: Diagnosis not present

## 2019-05-28 DIAGNOSIS — Z79891 Long term (current) use of opiate analgesic: Secondary | ICD-10-CM | POA: Diagnosis not present

## 2019-05-28 DIAGNOSIS — G894 Chronic pain syndrome: Secondary | ICD-10-CM | POA: Diagnosis not present

## 2019-05-28 DIAGNOSIS — D62 Acute posthemorrhagic anemia: Secondary | ICD-10-CM

## 2019-05-28 DIAGNOSIS — Z888 Allergy status to other drugs, medicaments and biological substances status: Secondary | ICD-10-CM

## 2019-05-28 DIAGNOSIS — Z823 Family history of stroke: Secondary | ICD-10-CM

## 2019-05-28 DIAGNOSIS — Z8249 Family history of ischemic heart disease and other diseases of the circulatory system: Secondary | ICD-10-CM

## 2019-05-28 DIAGNOSIS — Z96653 Presence of artificial knee joint, bilateral: Secondary | ICD-10-CM | POA: Diagnosis present

## 2019-05-28 DIAGNOSIS — I82441 Acute embolism and thrombosis of right tibial vein: Secondary | ICD-10-CM | POA: Diagnosis present

## 2019-05-28 DIAGNOSIS — I82463 Acute embolism and thrombosis of calf muscular vein, bilateral: Secondary | ICD-10-CM | POA: Diagnosis present

## 2019-05-28 DIAGNOSIS — Z79890 Hormone replacement therapy (postmenopausal): Secondary | ICD-10-CM

## 2019-05-28 DIAGNOSIS — Z7982 Long term (current) use of aspirin: Secondary | ICD-10-CM

## 2019-05-28 DIAGNOSIS — M5416 Radiculopathy, lumbar region: Secondary | ICD-10-CM

## 2019-05-28 DIAGNOSIS — Z4789 Encounter for other orthopedic aftercare: Principal | ICD-10-CM

## 2019-05-28 DIAGNOSIS — M5116 Intervertebral disc disorders with radiculopathy, lumbar region: Secondary | ICD-10-CM | POA: Diagnosis present

## 2019-05-28 DIAGNOSIS — F411 Generalized anxiety disorder: Secondary | ICD-10-CM | POA: Diagnosis not present

## 2019-05-28 DIAGNOSIS — Z79899 Other long term (current) drug therapy: Secondary | ICD-10-CM | POA: Diagnosis not present

## 2019-05-28 DIAGNOSIS — E039 Hypothyroidism, unspecified: Secondary | ICD-10-CM | POA: Diagnosis present

## 2019-05-28 DIAGNOSIS — N390 Urinary tract infection, site not specified: Secondary | ICD-10-CM | POA: Diagnosis not present

## 2019-05-28 DIAGNOSIS — E785 Hyperlipidemia, unspecified: Secondary | ICD-10-CM | POA: Diagnosis present

## 2019-05-28 DIAGNOSIS — Z8049 Family history of malignant neoplasm of other genital organs: Secondary | ICD-10-CM

## 2019-05-28 DIAGNOSIS — G4700 Insomnia, unspecified: Secondary | ICD-10-CM | POA: Diagnosis present

## 2019-05-28 DIAGNOSIS — I10 Essential (primary) hypertension: Secondary | ICD-10-CM

## 2019-05-28 DIAGNOSIS — K5900 Constipation, unspecified: Secondary | ICD-10-CM | POA: Diagnosis present

## 2019-05-28 DIAGNOSIS — Z6828 Body mass index (BMI) 28.0-28.9, adult: Secondary | ICD-10-CM | POA: Diagnosis not present

## 2019-05-28 DIAGNOSIS — Z8673 Personal history of transient ischemic attack (TIA), and cerebral infarction without residual deficits: Secondary | ICD-10-CM | POA: Diagnosis not present

## 2019-05-28 DIAGNOSIS — E669 Obesity, unspecified: Secondary | ICD-10-CM | POA: Diagnosis present

## 2019-05-28 DIAGNOSIS — R7303 Prediabetes: Secondary | ICD-10-CM | POA: Diagnosis present

## 2019-05-28 DIAGNOSIS — Z803 Family history of malignant neoplasm of breast: Secondary | ICD-10-CM | POA: Diagnosis not present

## 2019-05-28 DIAGNOSIS — Z87440 Personal history of urinary (tract) infections: Secondary | ICD-10-CM

## 2019-05-28 DIAGNOSIS — M7989 Other specified soft tissue disorders: Secondary | ICD-10-CM

## 2019-05-28 DIAGNOSIS — R112 Nausea with vomiting, unspecified: Secondary | ICD-10-CM

## 2019-05-28 DIAGNOSIS — Z634 Disappearance and death of family member: Secondary | ICD-10-CM

## 2019-05-28 DIAGNOSIS — M5126 Other intervertebral disc displacement, lumbar region: Secondary | ICD-10-CM

## 2019-05-28 DIAGNOSIS — K562 Volvulus: Secondary | ICD-10-CM | POA: Diagnosis not present

## 2019-05-28 DIAGNOSIS — B9620 Unspecified Escherichia coli [E. coli] as the cause of diseases classified elsewhere: Secondary | ICD-10-CM | POA: Diagnosis not present

## 2019-05-28 DIAGNOSIS — I82431 Acute embolism and thrombosis of right popliteal vein: Secondary | ICD-10-CM | POA: Diagnosis present

## 2019-05-28 DIAGNOSIS — Z96611 Presence of right artificial shoulder joint: Secondary | ICD-10-CM | POA: Diagnosis present

## 2019-05-28 DIAGNOSIS — F329 Major depressive disorder, single episode, unspecified: Secondary | ICD-10-CM | POA: Diagnosis present

## 2019-05-28 DIAGNOSIS — F419 Anxiety disorder, unspecified: Secondary | ICD-10-CM | POA: Diagnosis present

## 2019-05-28 DIAGNOSIS — K567 Ileus, unspecified: Secondary | ICD-10-CM | POA: Diagnosis not present

## 2019-05-28 DIAGNOSIS — K5903 Drug induced constipation: Secondary | ICD-10-CM

## 2019-05-28 DIAGNOSIS — G8220 Paraplegia, unspecified: Secondary | ICD-10-CM | POA: Diagnosis not present

## 2019-05-28 DIAGNOSIS — E876 Hypokalemia: Secondary | ICD-10-CM | POA: Diagnosis not present

## 2019-05-28 DIAGNOSIS — K9189 Other postprocedural complications and disorders of digestive system: Secondary | ICD-10-CM | POA: Diagnosis not present

## 2019-05-28 DIAGNOSIS — M48062 Spinal stenosis, lumbar region with neurogenic claudication: Secondary | ICD-10-CM

## 2019-05-28 DIAGNOSIS — Z853 Personal history of malignant neoplasm of breast: Secondary | ICD-10-CM

## 2019-05-28 LAB — CBC
HCT: 29.3 % — ABNORMAL LOW (ref 36.0–46.0)
Hemoglobin: 9.9 g/dL — ABNORMAL LOW (ref 12.0–15.0)
MCH: 32.8 pg (ref 26.0–34.0)
MCHC: 33.8 g/dL (ref 30.0–36.0)
MCV: 97 fL (ref 80.0–100.0)
Platelets: 240 10*3/uL (ref 150–400)
RBC: 3.02 MIL/uL — ABNORMAL LOW (ref 3.87–5.11)
RDW: 13.1 % (ref 11.5–15.5)
WBC: 8.6 10*3/uL (ref 4.0–10.5)
nRBC: 0 % (ref 0.0–0.2)

## 2019-05-28 LAB — CREATININE, SERUM
Creatinine, Ser: 0.66 mg/dL (ref 0.44–1.00)
GFR calc Af Amer: >60 mL/min (ref >60)
GFR calc non Af Amer: >60 mL/min (ref >60)

## 2019-05-28 MED ORDER — HEPARIN SODIUM (PORCINE) 5000 UNIT/ML IJ SOLN: 5000.0000 [IU] | Freq: Three times a day (TID) | INTRAMUSCULAR | Status: DC

## 2019-05-28 MED ORDER — VITAMIN D 25 MCG (1000 UNIT) PO TABS
2000.0000 [IU] | ORAL_TABLET | Freq: Every day | ORAL | Status: DC
  Administered 2019-05-29 – 2019-06-19 (×22): 2000 [IU] via ORAL
  Filled 2019-05-28 (×22): qty 2

## 2019-05-28 MED ORDER — CYCLOBENZAPRINE HCL 10 MG PO TABS
10.0000 mg | ORAL_TABLET | Freq: Three times a day (TID) | ORAL | Status: DC | PRN
  Administered 2019-06-06 – 2019-06-07 (×2): 10 mg via ORAL
  Filled 2019-05-28 (×2): qty 1

## 2019-05-28 MED ORDER — DOCUSATE SODIUM 100 MG PO CAPS
100.0000 mg | ORAL_CAPSULE | Freq: Every day | ORAL | Status: DC | PRN
  Administered 2019-05-29 – 2019-06-08 (×2): 100 mg via ORAL
  Filled 2019-05-28 (×2): qty 1

## 2019-05-28 MED ORDER — ASPIRIN 325 MG PO TABS: 325.0000 mg | ORAL_TABLET | Freq: Every day | ORAL | Status: DC

## 2019-05-28 MED ORDER — ASPIRIN 325 MG PO TABS
325.0000 mg | ORAL_TABLET | Freq: Every day | ORAL | Status: DC
  Administered 2019-05-29 – 2019-06-19 (×22): 325 mg via ORAL
  Filled 2019-05-28 (×22): qty 1

## 2019-05-28 MED ORDER — AMLODIPINE BESYLATE 5 MG PO TABS: 5.0000 mg | ORAL_TABLET | Freq: Every day | ORAL | Status: DC

## 2019-05-28 MED ORDER — ATORVASTATIN CALCIUM 40 MG PO TABS: 40.0000 mg | ORAL_TABLET | Freq: Every day | ORAL | Status: DC

## 2019-05-28 MED ORDER — LIDOCAINE HCL URETHRAL/MUCOSAL 2 % EX GEL: 1.0000 "application " | CUTANEOUS | Status: DC | PRN

## 2019-05-28 MED ORDER — LOSARTAN POTASSIUM 50 MG PO TABS
50.0000 mg | ORAL_TABLET | Freq: Every day | ORAL | Status: DC
  Administered 2019-05-29 – 2019-05-31 (×3): 50 mg via ORAL
  Filled 2019-05-28 (×3): qty 1

## 2019-05-28 MED ORDER — OXYCODONE HCL 5 MG PO TABS
10.0000 mg | ORAL_TABLET | ORAL | Status: DC | PRN
  Administered 2019-05-28 – 2019-05-30 (×3): 10 mg via ORAL
  Filled 2019-05-28 (×4): qty 2

## 2019-05-28 MED ORDER — DIAZEPAM 5 MG PO TABS: 5.0000 mg | ORAL_TABLET | Freq: Two times a day (BID) | ORAL | Status: DC | PRN

## 2019-05-28 MED ORDER — RIVAROXABAN 15 MG PO TABS
15.0000 mg | ORAL_TABLET | Freq: Two times a day (BID) | ORAL | Status: DC
  Administered 2019-05-28 – 2019-06-08 (×23): 15 mg via ORAL
  Filled 2019-05-28 (×23): qty 1

## 2019-05-28 MED ORDER — ACETAMINOPHEN 650 MG RE SUPP: 650.0000 mg | RECTAL | Status: DC | PRN

## 2019-05-28 MED ORDER — POLYETHYLENE GLYCOL 3350 17 G PO PACK
17.0000 g | PACK | Freq: Every day | ORAL | Status: DC | PRN
  Administered 2019-05-29 – 2019-06-08 (×5): 17 g via ORAL
  Filled 2019-05-28 (×5): qty 1

## 2019-05-28 MED ORDER — AMLODIPINE BESYLATE 5 MG PO TABS
5.0000 mg | ORAL_TABLET | Freq: Every day | ORAL | Status: DC
  Administered 2019-05-29 – 2019-05-31 (×3): 5 mg via ORAL
  Filled 2019-05-28 (×3): qty 1

## 2019-05-28 MED ORDER — ACETAMINOPHEN 325 MG PO TABS
650.0000 mg | ORAL_TABLET | ORAL | Status: DC | PRN
  Administered 2019-05-29 – 2019-06-09 (×14): 650 mg via ORAL
  Filled 2019-05-28 (×14): qty 2

## 2019-05-28 MED ORDER — ATORVASTATIN CALCIUM 40 MG PO TABS
40.0000 mg | ORAL_TABLET | Freq: Every day | ORAL | Status: DC
  Administered 2019-05-29 – 2019-06-19 (×22): 40 mg via ORAL
  Filled 2019-05-28 (×22): qty 1

## 2019-05-28 MED ORDER — LOSARTAN POTASSIUM 100 MG PO TABS: 50.0000 mg | ORAL_TABLET | Freq: Every day | ORAL | Status: DC

## 2019-05-28 MED ORDER — VITAMIN D 25 MCG (1000 UNIT) PO TABS: 2000.0000 [IU] | ORAL_TABLET | Freq: Every day | ORAL | Status: DC

## 2019-05-28 MED ORDER — PANTOPRAZOLE SODIUM 40 MG PO TBEC
40.0000 mg | DELAYED_RELEASE_TABLET | Freq: Every day | ORAL | Status: DC
  Administered 2019-05-28 – 2019-06-18 (×23): 40 mg via ORAL
  Filled 2019-05-28 (×22): qty 1

## 2019-05-28 MED ORDER — ONDANSETRON HCL 4 MG/2ML IJ SOLN
4.0000 mg | Freq: Four times a day (QID) | INTRAMUSCULAR | Status: DC | PRN
  Administered 2019-06-11: 4 mg via INTRAVENOUS
  Filled 2019-05-28: qty 2

## 2019-05-28 MED ORDER — SORBITOL 70 % SOLN
30.0000 mL | Freq: Every day | Status: DC | PRN
  Administered 2019-05-28 – 2019-06-09 (×5): 30 mL via ORAL
  Filled 2019-05-28 (×5): qty 30

## 2019-05-28 MED ORDER — ONDANSETRON HCL 4 MG PO TABS
4.0000 mg | ORAL_TABLET | Freq: Four times a day (QID) | ORAL | Status: DC | PRN
  Administered 2019-06-07 – 2019-06-11 (×2): 4 mg via ORAL
  Filled 2019-05-28 (×3): qty 1

## 2019-05-28 MED ORDER — DIAZEPAM 5 MG PO TABS
5.0000 mg | ORAL_TABLET | Freq: Two times a day (BID) | ORAL | Status: DC | PRN
  Administered 2019-05-29 – 2019-06-04 (×8): 5 mg via ORAL
  Filled 2019-05-28 (×13): qty 1

## 2019-05-28 MED ORDER — RIVAROXABAN 20 MG PO TABS: 20.0000 mg | ORAL_TABLET | Freq: Every day | ORAL | Status: DC

## 2019-05-28 MED ORDER — LEVOTHYROXINE SODIUM 75 MCG PO TABS
75.0000 ug | ORAL_TABLET | Freq: Every day | ORAL | Status: DC
  Administered 2019-05-29 – 2019-06-19 (×22): 75 ug via ORAL
  Filled 2019-05-28 (×22): qty 1

## 2019-05-28 MED ORDER — BOOST / RESOURCE BREEZE PO LIQD CUSTOM
1.0000 | Freq: Two times a day (BID) | ORAL | Status: DC
  Administered 2019-05-28 – 2019-06-18 (×21): 1 via ORAL

## 2019-05-28 NOTE — Discharge Summary (Signed)
Physician Discharge Summary  Patient ID: Tara [name] MRN: [id number] DOB/AGE: 1936/02/15 83 y.o.  Admit date: 05/23/2019 Discharge date: 05/28/2019  Admission Diagnoses:  Lumbar spinal stenosis Lumbar HNP  Discharge Diagnoses:  Same Active Problems:   Lumbar spinal stenosis   HNP (herniated nucleus pulposus), lumbar   Discharged Condition: Stable  Hospital Course:  Tara [name] is a 83 y.o. female who was admitted for the below procedure. There were no post operative complications. PT/OT rec CIR. At time of discharge, pain was well controlled, tolerating po, voiding normal. Insurance approval obtained for CIR. Ready for discharge.  Treatments: Surgery Redo decompressive lumbar laminectomy L3-4 in excess and requiring more work than would be needed with a standard interbody fusion with complete medial facetectomies and radical foraminotomies of the L4 and L3 nerve roots. 2.  Left-sided lumbar laminotomy and microdiscectomy with microdissection of the left L5 nerve root microscopic discectomy at L4-5 3.  Posterior lumbar interbody fusion L3-4 utilizing globus titanium peek cages packed with locally harvested autograft mixed with vivigen and BMP 4.Cortical screw fixation L3-4 utilizing globus Creo amp modular cortical screw set  Discharge Exam: Blood pressure (!) 145/61, pulse 78, temperature 98.1 F (36.7 C), temperature source Oral, resp. rate 20, height 5\' 3"  (1.6 m), weight 79.4 kg, SpO2 93 %. Awake, alert, oriented Speech fluent, appropriate CN grossly intact 5/5 BUE/BLE, left PF 3/5 Wound c/d/i  Disposition: Discharge disposition: [place] Not Defined       Discharge Instructions    Call MD for:  difficulty breathing, headache or visual disturbances   Complete by: As directed    Call MD for:  persistant dizziness or light-headedness   Complete by: As directed    Call MD for:  redness, tenderness, or signs of infection (pain,  swelling, redness, odor or green/yellow discharge around incision site)   Complete by: As directed    Call MD for:  severe uncontrolled pain   Complete by: As directed    Call MD for:  temperature >100.4   Complete by: As directed      Allergies as of 05/28/2019      Reactions   Ceclor [cefaclor] Hives, Itching      Medication List    TAKE these medications   amLODipine 5 MG tablet Commonly known as: NORVASC Take 1 tablet (5 mg total) by mouth daily.   aspirin 325 MG tablet Take 1 tablet (325 mg total) by mouth daily. Start taking on: May 31, 2019 What changed: These instructions start on May 31, 2019. If you are unsure what to do until then, ask your doctor or other care provider.   atorvastatin 40 MG tablet Commonly known as: LIPITOR TAKE 1 TABLET EVERY DAY   Cholecalciferol 50 MCG (2000 UT) Caps Take 2,000 Units by mouth daily.   diazepam 5 MG tablet Commonly known as: VALIUM Take 1 tablet (5 mg total) by mouth 2 (two) times daily as needed for anxiety.   levothyroxine 75 MCG tablet Commonly known as: SYNTHROID TAKE 1 TABLET EVERY DAY What changed: when to take this   losartan 100 MG tablet Commonly known as: COZAAR Take 0.5 tablets (50 mg total) by mouth daily.   OVER THE COUNTER MEDICATION Take 1 capsule by mouth daily. Fruit Full Antioxidant Extract Supplement   OVER THE COUNTER MEDICATION Take 1 capsule by mouth daily. Cruciferous Extract Supplement   Oxycodone HCl 10 MG Tabs Take 10 mg by mouth every 6 (six) hours as needed (for  severe pain).      Follow-up Information    Kary Kos, MD Follow up.   Specialty: Neurosurgery Contact information: [address] [place] [address] [place] [address] [phone]           Signed: Traci Sermon 05/28/2019, 8:40 AM

## 2019-05-28 NOTE — Progress Notes (Signed)
Inpatient Rehabilitation-Admissions Coordinator   Pt and family still wanting to pursue CIR program. Received medical clearance from neurosurgery service for admit to CIR today.  AC is able to offer a bed today and will plan for admit. Will notify RN, CM/SW regarding plan.   Please call if questions.   Jhonnie Garner, OTR/L  Rehab Admissions Coordinator  [phone] 05/28/2019 10:10 AM

## 2019-05-28 NOTE — Progress Notes (Signed)
When patient arrived she had a pure wick in place. Purewick was removed and patient was encouraged to call if she needed to void. Pt attempted several times with the NT to void but was unsuccessful. Pt said that she felt the urge to void but could not start the stream. Pt was incontinent after dinner with a large amount of urine. I obtained orders to I&O cath patient and got over 1000 cc of urine returned. Pt educated on neurogenic bladder and purpose of bladder program was explained.

## 2019-05-28 NOTE — Progress Notes (Signed)
Inpatient Rehabilitation  Patient information reviewed and entered into eRehab system by Letricia Krinsky M. Abdulraheem Pineo, M.A., CCC/SLP, PPS Coordinator.  Information including medical coding, functional ability and quality indicators will be reviewed and updated through discharge.    

## 2019-05-28 NOTE — Progress Notes (Signed)
  NEUROSURGERY PROGRESS NOTE   No issues overnight. No concerns this am Appropriate back soreness Left DF weakness continuing to improve  EXAM:  BP (!) 145/61 (BP Location: Right Arm)   Pulse 78   Temp 98.1 F (36.7 C) (Oral)   Resp 20   Ht 5\' 3"  (1.6 m)   Wt 79.4 kg   SpO2 93%   BMI 31.00 kg/m   Awake, alert, oriented  Speech fluent, appropriate  CN grossly intact  5/5 BUE/BLE, left PF 3/5  IMPRESSION/PLAN 83 y.o. female s/p L3-4 PLIF, L4-5 microdisc, progressing well - CIR today

## 2019-05-28 NOTE — Progress Notes (Signed)
Bilateral lower extremity Dopplers today 05/28/2019 show DVT right popliteal vein, right posterior tibial vein and right gastrocnemius vein as well as left lower extremity DVT left peroneal vein and left gastroc vein.  Discussed with physician assistant neurosurgery patient has been cleared to begin Xarelto.

## 2019-05-28 NOTE — H&P (Signed)
Physical Medicine and Rehabilitation Admission H&P    Chief complaint: Back pain HPI: Tara [name] is an 83 year old right-handed female with history of hypertension, hypothyroidism,left TKA 2019, TIA maintained on aspirin, lumbar laminectomy with decompression 2014.  History taken from chart review and patient.  Chart review, patient was alone in an independent living facility/[facility].  Patient used a walker and quad cane prior to admission.  Presented 05/23/2019 with low back pain radiating to the left lower extremity.  Work-up and imaging revealed critical stenosis L3-4  With disc herniation and possible recurrence of synovial cyst with radiculopathy.  Underwent redo decompressive lumbar laminectomy L3-4 with complete medial facetectomies and radical foraminotomies of the L4 and L3 nerve roots posterior lumbar interbody fusion 05/23/2019 per Dr. Saintclair Halsted.  Hospital course complicated by pain.  Routine back precautions with back brace when out of bed.  Subcutaneous heparin for DVT prophylaxis.  Therapy evaluation completed and patient was admitted for a comprehensive rehab program.  Review of Systems  Constitutional: Positive for malaise/fatigue. Negative for chills and fever.  HENT: Negative for hearing loss.   Eyes: Negative for blurred vision and double vision.  Respiratory: Negative for cough and shortness of breath.   Cardiovascular: Positive for leg swelling. Negative for chest pain and palpitations.  Gastrointestinal: Positive for constipation. Negative for heartburn and nausea.  Genitourinary: Positive for urgency. Negative for dysuria, flank pain and hematuria.  Musculoskeletal: Positive for back pain, joint pain and myalgias.  Skin: Negative for rash.  Neurological: Positive for dizziness, sensory change, focal weakness and weakness.  Psychiatric/Behavioral: Positive for depression. The patient has insomnia.        Anxiety  All other systems reviewed and are negative.  Past  Medical History:  Diagnosis Date  . Arthritis    knees  . Breast cancer ([place]) 09/30/15   right breast  . Dependent edema   . Depression    just lost spouse in Nov. 2018  . Essential hypertension, benign 04/15/2011  . Herniated lumbar disc without myelopathy   . Hx of staphylococcal infection   . Hypertension   . Hypothyroidism   . Obesity   . Pre-diabetes   . Radicular pain   . Recurrent UTI   . Stroke [facility])    ' mini"  . Thyroid disease    hypothyroidism  . Vertigo   . Wears contact lenses    one   Past Surgical History:  Procedure Laterality Date  . ANKLE SURGERY  1994   RIGHT  . BACK SURGERY    . BREAST LUMPECTOMY WITH NEEDLE LOCALIZATION Right 09/30/2015   Procedure: RIGHT BREAST LUMPECTOMY WITH NEEDLE LOCALIZATION;  Surgeon: Fanny Skates, MD;  Location: [facility];  Service: General;  Laterality: Right;  . BREAST SURGERY    . COLONOSCOPY W/ BIOPSIES AND POLYPECTOMY    . EYE SURGERY     bilateral  . HAND SURGERY  1951   RIGHT HAND  . JOINT REPLACEMENT     rt shoulder rotator cuff  . KNEE ARTHROSCOPY     left knee  . LUMBAR LAMINECTOMY/DECOMPRESSION MICRODISCECTOMY Right 01/11/2013   Procedure: LUMBAR LAMINECTOMY/DECOMPRESSION MICRODISCECTOMY 1 LEVEL,Right Lumbar Three-Four;  Surgeon: Elaina Hoops, MD;  Location: [place] [facility];  Service: Neurosurgery;  Laterality: Right;  . ROTATOR CUFF REPAIR  2011   RIGHT  . TONSILLECTOMY    . TOTAL KNEE ARTHROPLASTY     Left  . TOTAL KNEE ARTHROPLASTY Right 12/25/2017   Procedure: TOTAL KNEE ARTHROPLASTY;  Surgeon: Vickey Huger, MD;  Location: [place];  Service: Orthopedics;  Laterality: Right;   Family History  Problem Relation Age of Onset  . Breast cancer Sister   . Stroke Sister   . Cancer Brother        Kidney  . Heart failure Father   . Uterine cancer Sister   . Breast cancer Sister        Uterine  . Cancer Brother        Lung   Social History:  reports that she has never smoked. She has never used  smokeless tobacco. She reports that she does not drink alcohol or use drugs. Allergies:  Allergies  Allergen Reactions  . Ceclor [Cefaclor] Hives and Itching   Medications Prior to Admission  Medication Sig Dispense Refill  . amLODipine (NORVASC) 5 MG tablet TAKE 1 TABLET EVERY DAY (Patient taking differently: Take 5 mg by mouth daily. ) 90 tablet 2  . aspirin 325 MG tablet Take 325 mg by mouth daily.    Marland Kitchen atorvastatin (LIPITOR) 40 MG tablet TAKE 1 TABLET EVERY DAY (Patient taking differently: Take 40 mg by mouth daily. ) 90 tablet 3  . Cholecalciferol 2000 units CAPS Take 2,000 Units by mouth daily.    . diazepam (VALIUM) 5 MG tablet 1 tablet po twice  daily as needed for anxiety (Patient taking differently: Take 5 mg by mouth 2 (two) times daily as needed for anxiety. ) 60 tablet 5  . levothyroxine (SYNTHROID, LEVOTHROID) 75 MCG tablet TAKE 1 TABLET EVERY DAY (Patient taking differently: Take 75 mcg by mouth daily before breakfast. ) 90 tablet 0  . losartan (COZAAR) 100 MG tablet TAKE 1 TABLET EVERY DAY (Patient taking differently: Take 50 mg by mouth daily. ) 90 tablet 3  . OVER THE COUNTER MEDICATION Take 1 capsule by mouth daily. Fruit Full Antioxidant Extract Supplement    . OVER THE COUNTER MEDICATION Take 1 capsule by mouth daily. Cruciferous Extract Supplement    . Oxycodone HCl 10 MG TABS Take 10 mg by mouth every 6 (six) hours as needed (for severe pain).      Drug Regimen Review Drug regimen was reviewed and remains appropriate with no significant issues identified  Home: Home Living Family/patient expects to be discharged to:: Inpatient rehab(Independent living; [facility]) Living Arrangements: Alone Type of Home: Independent living facility Home Access: Level entry Home Layout: One level Bathroom Shower/Tub: Multimedia programmer: Handicapped height Bathroom Accessibility: Yes Home Equipment: Environmental consultant - 2 wheels, Cane - quad, Bedside commode, Walker -  standard, Environmental consultant - 4 wheels, Wheelchair - manual Additional Comments: Not the best historian.   Functional History: Prior Function Level of Independence: Needs assistance Gait / Transfers Assistance Needed: Has not been walking the last week or 2 due to pain in back/legs. has needed help with transfers to w/c and to bathroom doing a SPT with RW. Prior to 2 weeks ago, pt ambulatory. ADL's / Homemaking Assistance Needed: Prior to 2 weeks ago, pt reports independent with ADLs. Loves to ride her exercise bike. Also performing light ADLs Comments: Has needed help the last few weeks with transferring and getting to the bathroom due to back pain. Reports prior to 1 month ago, pt walking independently and caring for self. Does not do meals.  Functional Status:  Mobility: Bed Mobility Overal bed mobility: Needs Assistance Bed Mobility: Rolling, Sidelying to Sit Rolling: Supervision Sidelying to sit: Min guard Sit to sidelying: Mod assist, HOB elevated General bed  mobility comments: Cues for use of bedrails. Min A to elevate trunk safely. Transfers Overall transfer level: Needs assistance Equipment used: Rolling walker (2 wheeled) Transfer via Lift Equipment: Stedy Transfers: Sit to/from Stand Sit to Stand: Mod assist, Max assist General transfer comment: able to stand briefly, then attempting to move feet and legs buckling, pt pushing back, assisted back to EOB, attempted standing again without success, attempted squat pivot, but pt unable to power up partially on legs, used drop arm and scoot pivots with mod A increased time. Ambulation/Gait General Gait Details: Unable.    ADL: ADL Overall ADL's : Needs assistance/impaired Eating/Feeding: Set up, Supervision/ safety, Sitting Grooming: Brushing hair, Set up, Supervision/safety, Sitting(Applying makeup) Upper Body Bathing: Minimal assistance, Sitting Lower Body Bathing: Maximal assistance, Sit to/from stand Upper Body Dressing : Minimal  assistance, Sitting Lower Body Dressing: Maximal assistance, Sit to/from stand Lower Body Dressing Details (indicate cue type and reason): Unable to bring ankles to knees Toilet Transfer: Moderate assistance, +2 for physical assistance, +2 for safety/equipment(sit<>stand with stedy) Functional mobility during ADLs: Moderate assistance, +2 for physical assistance, +2 for safety/equipment, Rolling walker(sit<>stand with stedy and then RW) General ADL Comments: Pt presenting with decreased balance, strength, and cognition. Motivated to participate in therapy  Cognition: Cognition Overall Cognitive Status: Impaired/Different from baseline Orientation Level: Oriented X4 Cognition Arousal/Alertness: Awake/alert Behavior During Therapy: Anxious Overall Cognitive Status: Impaired/Different from baseline Area of Impairment: Attention, Memory, Problem solving Current Attention Level: Sustained Memory: Decreased short-term memory Problem Solving: Slow processing, Requires verbal cues General Comments: admits to memory trouble at baseline, anxious and not aware of deficits till we struggled to chair  Physical Exam: Blood pressure (!) 138/59, pulse 69, temperature 97.6 F (36.4 C), temperature source Oral, resp. rate 16, height 5\' 3"  (1.6 m), weight 79.4 kg, SpO2 (!) 83 %. Physical Exam  Vitals reviewed. Constitutional: She appears well-developed.  Obese  HENT:  Head: Normocephalic and atraumatic.  Eyes: EOM are normal. Right eye exhibits no discharge. Left eye exhibits no discharge.  Respiratory: Effort normal. No respiratory distress.  GI: She exhibits no distension.  Musculoskeletal:     Comments: Lower extremity edema  Neurological: She is alert.  Patient is alert.   Follows commands.   Follows full commands. Motor: Bilateral upper extremities: 5/5 proximal distal Right lower extremity: Hip flexion, knee extension 2/5, ankle dorsiflexion 5/5 Left lower extremity: Hip flexion, knee  extension 3/5, dorsiflexion 5/5 Sensation diminished light touch left foot  Skin: Skin is warm and dry.  Psychiatric:  Slightly distracted and anxious and verbose    No results found for this or any previous visit (from the past 48 hour(s)). No results found.  Medical Problem List and Plan: 1.  Decreased functional mobility secondary to lumbar radiculopathy, HNP L3-4.  Status post redo decompressive lumbar laminectomy L3-4 posterior lumbar interbody fusion 05/23/2019. Back brace when out of bed  Admit to CIR 2.  Antithrombotics: -DVT/anticoagulation: Subcutaneous heparin.  Lower extremity Dopplers ordered  -antiplatelet therapy: Aspirin 325 mg daily 3. Pain Management: Flexeril and oxycodone as needed.  Attempt to minimize pain medications due to alterations in cognition. 4. Mood: Valium 5 mg twice daily as needed anxiety  -antipsychotic agents: N/A 5. Neuropsych: This patient is capable of making decisions on her own behalf. 6. Skin/Wound Care: Routine skin checks 7. Fluids/Electrolytes/Nutrition: Routine I/Os.  CMP ordered for tomorrow a.m. 8.  Hypertension: Cozaar 50 mg daily, Norvasc 5 mg daily.  Monitor with increased mobility. 9.  Hyperlipidemia: Continue Lipitor 10.  History of TIA.  Continue aspirin 11.  Hypothyroidism.  Synthroid 12.  Constipation.  Laxative assistance  Post Admission Physician Evaluation: 1. Preadmission assessment reviewed and changes made below. 2. Functional deficits secondary  to  lumbar radiculopathy, HNP L3-4.  Status post redo decompressive lumbar laminectomy L3-4 posterior lumbar interbody fusion 05/23/2019. 3. Patient is admitted to receive collaborative, interdisciplinary care between the physiatrist, rehab nursing staff, and therapy team. 4. Patient's level of medical complexity and substantial therapy needs in context of that medical necessity cannot be provided at a lesser intensity of care such as a SNF. 5. Patient has experienced substantial  functional loss from his/her baseline which was documented above under the "Functional History" and "Functional Status" headings.  Judging by the patient's diagnosis, physical exam, and functional history, the patient has potential for functional progress which will result in measurable gains while on inpatient rehab.  These gains will be of substantial and practical use upon discharge  in facilitating mobility and self-care at the household level. 6. Physiatrist will provide 24 hour management of medical needs as well as oversight of the therapy plan/treatment and provide guidance as appropriate regarding the interaction of the two. 7. 24 hour rehab nursing will assist with bladder management, bowel management, safety, skin/wound care, disease management, medication administration, pain management and patient education  and help integrate therapy concepts, techniques,education, etc. 8. PT will assess and treat for/with: Lower extremity strength, range of motion, stamina, balance, functional mobility, safety, adaptive techniques and equipment, wound care, coping skills, pain control, education. Goals are: Supervision/Min A. 9. OT will assess and treat for/with: ADL's, functional mobility, safety, upper extremity strength, adaptive techniques and equipment, wound mgt, ego support, and community reintegration.   Goals are: Supervision/Min A. Therapy may not proceed with showering this patient. 10. Case Management and Social Worker will assess and treat for psychological issues and discharge planning. 11. Team conference will be held weekly to assess progress toward goals and to determine barriers to discharge. 12. Patient will receive at least 3 hours of therapy per day at least 5 days per week. 13. ELOS: 14-18 days. 14. Prognosis:  good  I have personally performed a face to face diagnostic evaluation, including, but not limited to relevant history and physical exam findings, of this patient and developed  relevant assessment and plan.  Additionally, I have reviewed and concur with the physician assistant's documentation above.  Delice Lesch, MD, ABPMR Lavon Paganini Angiulli, PA-C 05/28/2019

## 2019-05-28 NOTE — Progress Notes (Signed)
I/O cath  . 

## 2019-05-28 NOTE — Progress Notes (Signed)
Bilateral lower extremity venous duplex has been completed. Preliminary results can be found in CV Proc through chart review.  Results were given to the patient's nurse, Eld[place].   05/28/19 1:20 PM Carlos Levering RVT

## 2019-05-28 NOTE — Progress Notes (Signed)
ANTICOAGULATION CONSULT NOTE - Initial Consult  Pharmacy Consult for xarelto Indication: VTE treatment  Allergies  Allergen Reactions  . Ceclor [Cefaclor] Hives and Itching    Patient Measurements:    Vital Signs: Temp: 98.2 F (36.8 C) (07/07 1327) Temp Source: Oral (07/07 0806) BP: 128/56 (07/07 1327) Pulse Rate: 66 (07/07 1327)  Labs: No results for input(s): HGB, HCT, PLT, APTT, LABPROT, INR, HEPARINUNFRC, HEPRLOWMOCWT, CREATININE, CKTOTAL, CKMB, TROPONINIHS in the last 72 hours.  Estimated Creatinine Clearance: 56 mL/min (by C-G formula based on SCr of 0.72 mg/dL).   Medical History: Past Medical History:  Diagnosis Date  . Arthritis    knees  . Breast cancer ([place]) 09/30/15   right breast  . Dependent edema   . Depression    just lost spouse in Nov. 2018  . Essential hypertension, benign 04/15/2011  . Herniated lumbar disc without myelopathy   . Hx of staphylococcal infection   . Hypertension   . Hypothyroidism   . Obesity   . Pre-diabetes   . Radicular pain   . Recurrent UTI   . Stroke [facility])    ' mini"  . Thyroid disease    hypothyroidism  . Vertigo   . Wears contact lenses    one   Assessment: 83 yo patient with Bilateral lower extremity Dopplers today 05/28/2019 show DVT right popliteal vein, right posterior tibial vein and right gastrocnemius vein as well as left lower extremity DVT left peroneal vein and left gastroc vein. Patient cleared by N surg to start xarelto per Rehab PA note.   Goal of Therapy:  Monitor platelets by anticoagulation protocol: Yes   Plan:  Xarelto 15mg  PO BID x 21 days, then 20mg  Daily D/c sq heparin Monitor for bleeding, and changes in renal function  Cayden Rautio A. Levada Dy, PharmD, [place] Please utilize Amion for appropriate phone number to reach the unit pharmacist ([place])   05/28/2019,1:44 PM

## 2019-05-28 NOTE — Progress Notes (Signed)
Pt admitted to 4W25. Pt is alert and oriented. Call bell is within reach.
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
## 2019-05-28 NOTE — Progress Notes (Signed)
Tara Arn, MD  Physician  Physical Medicine and Rehabilitation  PMR Pre-admission  Signed  Date of Service:  05/27/2019 6:59 PM      Related encounter: Admission (Discharged) from 05/23/2019 in [facility]         PMR Admission Coordinator Pre-Admission Assessment  Patient: Tara [name] is an 83 y.o., female MRN: [id number] DOB: 1936-02-28 Height: 5' 3"  (160 cm) Weight: 79.4 kg  Insurance Information HMO:     PPO:      PCP:      IPA:      80/20: yes     OTHER:  PRIMARY: Medicare A and B      Policy#: [id number]      Subscriber: Patient CM Name:       Phone#:      Fax#:  Pre-Cert#:       Employer:  Benefits:  Phone #: online     Name: verified eligibility online via [place] on 05/28/2019  Eff. Date: Part A effective 07/23/2003, Part B effective 12/22/2006     Deduct: $1,408      Out of Pocket Max: NA      Life Max: NA CIR: covered per Medicare guidelines once yearly deductible is met      SNF: days 1-20, 100%, days 21-100, 80% Outpatient: 80%     Co-Pay: 20% Home Health: 100%      Co-Pay:  DME: 80%     Co-Pay: 20% Providers: Pt's choice SECONDARY: BCBS      Policy#: [id number]      Subscriber: Patient CM Name:       Phone#:      Fax#:  Pre-Cert#:       Employer:  Benefits:  Phone #: [phone]     Name:  Eff. Date:      Deduct:       Out of Pocket Max:       Life Max:  CIR:       SNF:  Outpatient:      Co-Pay:  Home Health:       Co-Pay:  DME:      Co-Pay:   Medicaid Application Date:       Case Manager:  Disability Application Date:       Case Worker:   The "Data Collection Information Summary" for patients in Inpatient Rehabilitation Facilities with attached "Privacy Act [place] Records" was provided and verbally reviewed with: Patient  Emergency Contact Information         Contact Information    Name Relation Home Work [name], b [place] Son   [phone]   Doyle Askew Daughter    [phone]      Current Medical History  Patient Admitting Diagnosis: Lumbar spinal stenosis s/p L3-4 PLIF, L4-5 microdiscectomy  History of Present Illness: Tara [name] is an 83 year old female with history of hypertension, hypothyroidism,left TKA 2019,TIA maintained on aspirin, lumbar laminectomy with decompression 2014. Presented 05/23/2019 with low back pain radiating to the left lower extremity. Work-up and imaging revealed critical stenosis at L3-4 with large disc radiation possible recurrence of synovial cyst with radiculopathy. Underwent redo decompressive lumbar laminectomy L3-4 with complete medial facetectomiesand radical foraminotomies of the L4 and L3 nerve roots posterior lumbar interbody fusion 05/23/2019 per Dr. Saintclair Halsted. Hospital course pain management. Routine back precautionswith back brace when out of bed. Subcutaneous heparin for DVT prophylaxis. Therapy evaluation completed and patient is to be  admitted for a comprehensive rehab program on 05/28/19.     Patient's medical record from [facility] has been reviewed by the rehabilitation admission coordinator and physician.  Past Medical History      Past Medical History:  Diagnosis Date  . Arthritis    knees  . Breast cancer ([place]) 09/30/15   right breast  . Dependent edema   . Depression    just lost spouse in Nov. 2018  . Essential hypertension, benign 04/15/2011  . Herniated lumbar disc without myelopathy   . Hx of staphylococcal infection   . Hypertension   . Hypothyroidism   . Obesity   . Pre-diabetes   . Radicular pain   . Recurrent UTI   . Stroke [facility])    ' mini"  . Thyroid disease    hypothyroidism  . Vertigo   . Wears contact lenses    one    Family History   family history includes Breast cancer in her sister and sister; Cancer in her brother and brother; Heart failure in her father; Stroke in her sister; Uterine cancer in her sister.  Prior  Rehab/Hospitalizations Has the patient had prior rehab or hospitalizations prior to admission? No  Has the patient had major surgery during 100 days prior to admission? Yes             Current Medications  Current Facility-Administered Medications:  .  0.9 %  sodium chloride infusion, 250 mL, Intravenous, Continuous, Kary Kos, MD, Last Rate: 1 mL/hr at 05/23/19 2301, 250 mL at 05/23/19 2301 .  acetaminophen (TYLENOL) tablet 650 mg, 650 mg, Oral, Q4H PRN, 650 mg at 05/25/19 1113 **OR** acetaminophen (TYLENOL) suppository 650 mg, 650 mg, Rectal, Q4H PRN, Kary Kos, MD .  alum & mag hydroxide-simeth (MAALOX/MYLANTA) 200-200-20 MG/5ML suspension 30 mL, 30 mL, Oral, Q6H PRN, Kary Kos, MD .  amLODipine (NORVASC) tablet 5 mg, 5 mg, Oral, Daily, Kary Kos, MD, 5 mg at 05/28/19 0944 .  aspirin tablet 325 mg, 325 mg, Oral, Daily, Kary Kos, MD, 325 mg at 05/28/19 0944 .  atorvastatin (LIPITOR) tablet 40 mg, 40 mg, Oral, Daily, Kary Kos, MD, 40 mg at 05/28/19 0944 .  bupivacaine liposome (EXPAREL) 1.3 % injection 266 mg, 20 mL, Infiltration, Once, Kary Kos, MD .  cholecalciferol (VITAMIN D3) tablet 2,000 Units, 2,000 Units, Oral, Daily, Kary Kos, MD, 2,000 Units at 05/28/19 0944 .  cyclobenzaprine (FLEXERIL) tablet 10 mg, 10 mg, Oral, TID PRN, Kary Kos, MD, 10 mg at 05/28/19 0046 .  diazepam (VALIUM) tablet 5 mg, 5 mg, Oral, BID PRN, Kary Kos, MD .  docusate sodium (COLACE) capsule 100 mg, 100 mg, Oral, Daily PRN, Costella, Vista Mink, PA-C, 100 mg at 05/27/19 2102 .  feeding supplement (BOOST / RESOURCE BREEZE) liquid 1 Container, 1 Container, Oral, BID BM, Kary Kos, MD, 1 Container at 05/28/19 0944 .  heparin injection 5,000 Units, 5,000 Units, Subcutaneous, Q8H, Judith Part, MD, 5,000 Units at 05/28/19 0536 .  HYDROmorphone (DILAUDID) injection 0.5 mg, 0.5 mg, Intravenous, Q2H PRN, Kary Kos, MD .  levothyroxine (SYNTHROID) tablet 75 mcg, 75 mcg, Oral, QAC breakfast, Kary Kos, MD, 75 mcg at 05/28/19 0536 .  losartan (COZAAR) tablet 50 mg, 50 mg, Oral, Daily, Kary Kos, MD, 50 mg at 05/28/19 0944 .  menthol-cetylpyridinium (CEPACOL) lozenge 3 mg, 1 lozenge, Oral, PRN **OR** phenol (CHLORASEPTIC) mouth spray 1 spray, 1 spray, Mouth/Throat, PRN, Kary Kos, MD .  ondansetron [facility]) tablet 4 mg,  4 mg, Oral, Q6H PRN, 4 mg at 05/24/19 0927 **OR** ondansetron (ZOFRAN) injection 4 mg, 4 mg, Intravenous, Q6H PRN, Kary Kos, MD .  oxyCODONE (Oxy IR/ROXICODONE) immediate release tablet 10 mg, 10 mg, Oral, Q3H PRN, Kary Kos, MD, 10 mg at 05/28/19 0944 .  pantoprazole (PROTONIX) EC tablet 40 mg, 40 mg, Oral, QHS, Kary Kos, MD, 40 mg at 05/27/19 2102 .  polyethylene glycol (MIRALAX / GLYCOLAX) packet 17 g, 17 g, Oral, Daily PRN, Costella, Vista Mink, PA-C, 17 g at 05/27/19 2102 .  sodium chloride flush (NS) 0.9 % injection 3 mL, 3 mL, Intravenous, Q12H, Kary Kos, MD, 3 mL at 05/28/19 0944 .  sodium chloride flush (NS) 0.9 % injection 3 mL, 3 mL, Intravenous, PRN, Kary Kos, MD  Patients Current Diet:     Diet Order                  Diet regular Room service appropriate? Yes; Fluid consistency: Thin  Diet effective now               Precautions / Restrictions Precautions Precautions: Fall, Back Precaution Booklet Issued: No Precaution Comments: Pt not recalling back precautions. Reviewed back precautions Spinal Brace: Other (comment)(not available to apply due to not yet in room) Restrictions Weight Bearing Restrictions: No   Has the patient had 2 or more falls or a fall with injury in the past year? No  Prior Activity Level Limited Community (1-2x/wk): mostly stay in ILF, did activities like choir ; [place] drove PTA  Prior Functional Level Self Care: Did the patient need help bathing, dressing, using the toilet or eating? Independent  Indoor Mobility: Did the patient need assistance with walking from room to room (with or without  device)? Independent  Stairs: Did the patient need assistance with internal or external stairs (with or without device)? Unknown  Functional Cognition: Did the patient need help planning regular tasks such as shopping or remembering to take medications? [place] Devices/Equipment: Kasandra Knudsen (specify quad or straight), Environmental consultant (specify type), Wheelchair Home Equipment: Environmental consultant - 2 wheels, [employer] - quad, Bedside commode, Walker - standard, Environmental consultant - 4 wheels, Wheelchair - manual  Prior Device Use: Indicate devices/aids used by the patient prior to current illness, exacerbation or injury? 3 pronged cane (son says that made her more unstable)  Current Functional Level Cognition  Overall Cognitive Status: Impaired/Different from baseline Current Attention Level: Sustained Orientation Level: Oriented X4 General Comments: admits to memory trouble at baseline, anxious and not aware of deficits till we struggled to chair    Extremity Assessment (includes Sensation/Coordination)  Upper Extremity Assessment: Generalized weakness  Lower Extremity Assessment: Defer to PT evaluation LLE Deficits / Details: Grossly ~2+-3/5 throughout with numbness in foot. LLE Sensation: decreased light touch, decreased proprioception LLE Coordination: decreased gross motor, decreased fine motor    ADLs  Overall ADL's : Needs assistance/impaired Eating/Feeding: Set up, Supervision/ safety, Sitting Grooming: Brushing hair, Set up, Supervision/safety, Sitting(Applying makeup) Upper Body Bathing: Minimal assistance, Sitting Lower Body Bathing: Maximal assistance, Sit to/from stand Upper Body Dressing : Minimal assistance, Sitting Lower Body Dressing: Maximal assistance, Sit to/from stand Lower Body Dressing Details (indicate cue type and reason): Unable to bring ankles to knees Toilet Transfer: Moderate assistance, +2 for physical assistance, +2 for  safety/equipment(sit<>stand with stedy) Functional mobility during ADLs: Moderate assistance, +2 for physical assistance, +2 for safety/equipment, Rolling walker(sit<>stand with stedy and then RW) General ADL Comments: Pt presenting with  decreased balance, strength, and cognition. Motivated to participate in therapy    Mobility  Overal bed mobility: Needs Assistance Bed Mobility: Rolling, Sidelying to Sit Rolling: Supervision Sidelying to sit: Min guard Sit to sidelying: Mod assist, HOB elevated General bed mobility comments: Cues for use of bedrails. Min A to elevate trunk safely.    Transfers  Overall transfer level: Needs assistance Equipment used: Rolling walker (2 wheeled) Transfer via Lift Equipment: Stedy Transfers: Sit to/from Stand Sit to Stand: Mod assist, Max assist General transfer comment: able to stand briefly, then attempting to move feet and legs buckling, pt pushing back, assisted back to EOB, attempted standing again without success, attempted squat pivot, but pt unable to power up partially on legs, used drop arm and scoot pivots with mod A increased time.    Ambulation / Gait / Stairs / Wheelchair Mobility  Ambulation/Gait General Gait Details: Unable.    Posture / Balance Dynamic Sitting Balance Sitting balance - Comments: Able to sit in chair and perform ADL tasks without difficulty and reaching outside BoS wtihout LOB. Balance Overall balance assessment: Needs assistance Sitting-balance support: Feet supported, No upper extremity supported Sitting balance-Leahy Scale: Fair Sitting balance - Comments: Able to sit in chair and perform ADL tasks without difficulty and reaching outside BoS wtihout LOB. Standing balance support: During functional activity Standing balance-Leahy Scale: Zero Standing balance comment: Reliant on physical A    Special needs/care consideration BiPAP/CPAP : no CPM : no Continuous Drip IV : no Dialysis : no        Days : no  Life Vest : no Oxygen: on RA Special Bed : no Trach Size : no Wound Vac (area) : no      Location: no Skin: surgical incision to back                          Bowel mgmt: no BM charted Bladder mgmt: continent Diabetic mgmt: no Behavioral consideration  : no Chemo/radiation : not currently   Previous Home Environment (from acute therapy documentation) Living Arrangements: Alone Type of Home: Independent living facility Home Layout: One level Home Access: Level entry Bathroom Shower/Tub: Multimedia programmer: Handicapped height Bathroom Accessibility: Yes Home Care Services: No Additional Comments: Not the best historian.  Discharge Living Setting Plans for Discharge Living Setting: Patient's home Type of Home at Discharge: [place] Name at Discharge: [facility]  Discharge Home Layout: One level Discharge Home Access: Level entry Discharge Bathroom Shower/Tub: Walk-in shower Discharge Bathroom Toilet: Handicapped height Discharge Bathroom Accessibility: Yes How Accessible: Accessible via walker Does the patient have any problems obtaining your medications?: No  Social/Family/Support Systems Patient Roles: Other (Comment)(part of ILF community; has son and daugther who are involved in care) Contact Information: son Jeani Hawking): [phone]; daughter ([name]: [phone]) Anticipated Caregiver: hired assist as needed (has used hired CNAs in past, mostly at nighttime; plan to have friend/neighbor who has assisted in the past for intemittant assist)-confirmed with son and daughter they have spoken to this friend about her plans for assist.*I personally confirmed with friend Imagene Sheller [phone]) with her intention to assist as needed, up to [place]).  Anticipated Caregiver's Contact Information: see above (lynn) able to provide information about hired assistance as needed Ability/Limitations of Caregiver: Min A Caregiver  Availability: Intermittent Discharge Plan Discussed with Primary Caregiver: Yes Is Caregiver In Agreement with Plan?: Yes Does Caregiver/Family have Issues with Lodging/Transportation while Pt is in  Rehab?: No  Goals/Additional Needs Patient/Family Goal for Rehab: PT/OT: Supervision/Min A; SLP: NA Expected length of stay: 10-14 days Cultural Considerations: [place] Dietary Needs: regular, thin liquids Equipment Needs: TBD Pt/Family Agrees to Admission and willing to participate: Yes Program Orientation Provided & Reviewed with Pt/Caregiver Including Roles  & Responsibilities: Yes  Barriers to Discharge: Lack of/limited family support(only intermittant phsyical assist. )  Decrease burden of Care through IP rehab admission: NA   Possible need for SNF placement upon discharge: Not anticipated if pt is able to get to intermittent A level. Pt has a friend who can assist with getting her to the commode with light Min A/G during the day. Pt and family understand she will also likely require hired assistance for more coverage. If pt does not progress to anticipated functional level, pt and family understand SNF placement may be sought.   Patient Condition: I have reviewed medical records from [facility], spoken with the patient, her friend who plans to assist, , and son and daughter. I met with patient at the bedside for inpatient rehabilitation assessment.  Patient will benefit from ongoing PT and OT, can actively participate in 3 hours of therapy a day 5 days of the week, and can make measurable gains during the admission.  Patient will also benefit from the coordinated team approach during an Inpatient Acute Rehabilitation admission.  The patient will receive intensive therapy as well as Rehabilitation physician, nursing, social worker, and care management interventions.  Due to bladder management, bowel management, safety, skin/wound care, disease management, medication  administration, pain management and patient education the patient requires 24 hour a day rehabilitation nursing.  The patient is currently Mod/Max A with transfers and Min to Max A for basic ADLs.  Discharge setting and therapy post discharge at home with home health is anticipated.  Patient has agreed to participate in the Acute Inpatient Rehabilitation Program and will admit today.  Preadmission Screen Completed By:  Jhonnie Garner, 05/28/2019 10:13 AM ______________________________________________________________________   Discussed status with Dr. Posey Pronto on 05/28/2019 at 9:38AM and received approval for admission today.  Admission Coordinator:  Jhonnie Garner, OT, time 9:38AM/Date 05/28/2019   Assessment/Plan: Diagnosis: Lumbar spinal stenosis s/p L3-4 PLIF, L4-5 microdiscectomy  1. Does the need for close, 24 hr/day Medical supervision in concert with the patient's rehab needs make it unreasonable for this patient to be served in a less intensive setting? Yes  2. Co-Morbidities requiring supervision/potential complications: hypertension, hypothyroidism,left TKA 2019, TIA maintained on aspirin, lumbar laminectomy with decompression 2014 3. Due to bladder management, bowel management, safety, skin/wound care, disease management, medication administration, pain management and patient education, does the patient require 24 hr/day rehab nursing? Yes 4. Does the patient require coordinated care of a physician, rehab nurse, PT (1-2 hrs/day, 5 days/week) and OT (1-2 hrs/day, 5 days/week) to address physical and functional deficits in the context of the above medical diagnosis(es)? Yes Addressing deficits in the following areas: balance, endurance, locomotion, strength, transferring, bathing, dressing, toileting and psychosocial support 5. Can the patient actively participate in an intensive therapy program of at least 3 hrs of therapy 5 days a week? Yes 6. The potential for patient to make measurable gains  while on inpatient rehab is excellent 7. Anticipated functional outcomes upon discharge from inpatients are: supervision and min assist PT, supervision and min assist OT, n/a SLP 8. Estimated rehab length of stay to reach the above functional goals is: 12-16 days. 9. Anticipated D/C setting: Home 10. Anticipated post  D/C treatments: HH therapy and Home excercise program 11. Overall Rehab/Functional Prognosis: good   MD Signature: Delice Lesch, MD, ABPMR        Revision History Date/Time User Provider Type Action  05/28/2019 10:30 AM Tara Arn, MD Physician Sign  05/28/2019 10:13 AM Jhonnie Garner, OT Rehab Admission Coordinator Share  View Details Report

## 2019-05-29 ENCOUNTER — Inpatient Hospital Stay (HOSPITAL_COMMUNITY): Payer: Medicare Other | Admitting: Occupational Therapy

## 2019-05-29 ENCOUNTER — Inpatient Hospital Stay (HOSPITAL_COMMUNITY): Payer: Medicare Other | Admitting: Physical Therapy

## 2019-05-29 DIAGNOSIS — M5416 Radiculopathy, lumbar region: Secondary | ICD-10-CM

## 2019-05-29 LAB — COMPREHENSIVE METABOLIC PANEL
ALT: 19 U/L (ref 0–44)
AST: 28 U/L (ref 15–41)
Albumin: 2.3 g/dL — ABNORMAL LOW (ref 3.5–5.0)
Alkaline Phosphatase: 58 U/L (ref 38–126)
Anion gap: 8 (ref 5–15)
BUN: 9 mg/dL (ref 8–23)
CO2: 29 mmol/L (ref 22–32)
Calcium: 8.3 mg/dL — ABNORMAL LOW (ref 8.9–10.3)
Chloride: 102 mmol/L (ref 98–111)
Creatinine, Ser: 0.83 mg/dL (ref 0.44–1.00)
GFR calc Af Amer: >60 mL/min (ref >60)
GFR calc non Af Amer: >60 mL/min (ref >60)
Glucose, Bld: 105 mg/dL — ABNORMAL HIGH (ref 70–99)
Potassium: 3.5 mmol/L (ref 3.5–5.1)
Sodium: 139 mmol/L (ref 135–145)
Total Bilirubin: 0.8 mg/dL (ref 0.3–1.2)
Total Protein: 5.2 g/dL — ABNORMAL LOW (ref 6.5–8.1)

## 2019-05-29 LAB — CBC WITH DIFFERENTIAL/PLATELET
Abs Immature Granulocytes: 0.02 10*3/uL (ref 0.00–0.07)
Basophils Absolute: 0.1 10*3/uL (ref 0.0–0.1)
Basophils Relative: 1 %
Eosinophils Absolute: 0.5 10*3/uL (ref 0.0–0.5)
Eosinophils Relative: 6 %
HCT: 28.2 % — ABNORMAL LOW (ref 36.0–46.0)
Hemoglobin: 9.5 g/dL — ABNORMAL LOW (ref 12.0–15.0)
Immature Granulocytes: 0 %
Lymphocytes Relative: 36 %
Lymphs Abs: 2.7 10*3/uL (ref 0.7–4.0)
MCH: 32.6 pg (ref 26.0–34.0)
MCHC: 33.7 g/dL (ref 30.0–36.0)
MCV: 96.9 fL (ref 80.0–100.0)
Monocytes Absolute: 0.6 10*3/uL (ref 0.1–1.0)
Monocytes Relative: 8 %
Neutro Abs: 3.7 10*3/uL (ref 1.7–7.7)
Neutrophils Relative %: 49 %
Platelets: 247 10*3/uL (ref 150–400)
RBC: 2.91 MIL/uL — ABNORMAL LOW (ref 3.87–5.11)
RDW: 13.2 % (ref 11.5–15.5)
WBC: 7.6 10*3/uL (ref 4.0–10.5)
nRBC: 0 % (ref 0.0–0.2)

## 2019-05-29 LAB — GLUCOSE, CAPILLARY: Glucose-Capillary: 120 mg/dL — ABNORMAL HIGH (ref 70–99)

## 2019-05-29 NOTE — Evaluation (Signed)
Occupational Therapy Assessment and Plan  Patient Details  Name: Tara [name] MRN: [id number] Date of Birth: 08/30/1936  OT Diagnosis: ataxia, muscle weakness (generalized) and paraparesis at level L3-L4 Rehab Potential: Rehab Potential (ACUTE ONLY): Good ELOS: 2.5-3 weeks   Today's Date: 05/29/2019 OT Individual Time: 0845-1000 and 1400-1500 OT Individual Time Calculation (min): 75 min and 60 min     Problem List:  Patient Active Problem List   Diagnosis Date Noted  . Lumbar radiculopathy 05/28/2019  . Postoperative pain   . Drug induced constipation   . History of TIA (transient ischemic attack)   . Lumbar spinal stenosis 05/23/2019  . HNP (herniated nucleus pulposus), lumbar 05/23/2019  . TIA (transient ischemic attack) 07/02/2018  . Chronic nonintractable headache 07/02/2018  . S/P total knee replacement 12/25/2017  . Ductal carcinoma in situ of right breast 08/17/2015  . Breast cancer of upper-outer quadrant of right female breast ([place]) 08/14/2015  . Right hip pain 10/27/2012  . Low back pain 10/27/2012  . Hypertension   . Osteoarthritis of right knee 11/30/2011  . Baker's cyst of knee 11/30/2011  . Dependent edema 06/19/2011  . Prediabetes 06/19/2011  . Urinary tract infection, recurrent 06/19/2011  . Urinary incontinence 06/19/2011  . Benign positional vertigo 06/19/2011  . Hypothyroidism 06/19/2011  . Tubular adenoma of colon 06/19/2011  . Essential hypertension, benign 04/15/2011    Past Medical History:  Past Medical History:  Diagnosis Date  . Arthritis    knees  . Breast cancer ([place]) 09/30/15   right breast  . Dependent edema   . Depression    just lost spouse in Nov. 2018  . Essential hypertension, benign 04/15/2011  . Herniated lumbar disc without myelopathy   . Hx of staphylococcal infection   . Hypertension   . Hypothyroidism   . Obesity   . Pre-diabetes   . Radicular pain   . Recurrent UTI   . Stroke [facility])    ' mini"  . Thyroid disease     hypothyroidism  . Vertigo   . Wears contact lenses    one   Past Surgical History:  Past Surgical History:  Procedure Laterality Date  . ANKLE SURGERY  1994   RIGHT  . BACK SURGERY    . BREAST LUMPECTOMY WITH NEEDLE LOCALIZATION Right 09/30/2015   Procedure: RIGHT BREAST LUMPECTOMY WITH NEEDLE LOCALIZATION;  Surgeon: Fanny Skates, MD;  Location: [facility];  Service: General;  Laterality: Right;  . BREAST SURGERY    . COLONOSCOPY W/ BIOPSIES AND POLYPECTOMY    . EYE SURGERY     bilateral  . HAND SURGERY  1951   RIGHT HAND  . JOINT REPLACEMENT     rt shoulder rotator cuff  . KNEE ARTHROSCOPY     left knee  . LUMBAR LAMINECTOMY/DECOMPRESSION MICRODISCECTOMY Right 01/11/2013   Procedure: LUMBAR LAMINECTOMY/DECOMPRESSION MICRODISCECTOMY 1 LEVEL,Right Lumbar Three-Four;  Surgeon: Elaina Hoops, MD;  Location: [facility];  Service: Neurosurgery;  Laterality: Right;  . ROTATOR CUFF REPAIR  2011   RIGHT  . TONSILLECTOMY    . TOTAL KNEE ARTHROPLASTY     Left  . TOTAL KNEE ARTHROPLASTY Right 12/25/2017   Procedure: TOTAL KNEE ARTHROPLASTY;  Surgeon: Vickey Huger, MD;  Location: [facility];  Service: Orthopedics;  Laterality: Right;    Assessment & Plan Clinical Impression: Tara [name] is an 83 year old right-handed female with history of hypertension, hypothyroidism,left TKA 2019, TIA maintained on aspirin, lumbar laminectomy with decompression 2014.  History taken  from chart review and patient.  Chart review, patient was alone in an independent living facility/[facility].  Patient used a walker and quad cane prior to admission.  Presented 05/23/2019 with low back pain radiating to the left lower extremity.  Work-up and imaging revealed critical stenosis L3-4  With disc herniation and possible recurrence of synovial cyst with radiculopathy.  Underwent redo decompressive lumbar laminectomy L3-4 with complete medial facetectomies and radical foraminotomies of the L4 and L3 nerve  roots posterior lumbar interbody fusion 05/23/2019 per Dr. Saintclair Halsted.  Hospital course complicated by pain.  Routine back precautions with back brace when out of bed.  Subcutaneous heparin for DVT prophylaxis.  Therapy evaluation completed and patient was admitted for a comprehensive rehab program.  Patient transferred to CIR on 05/28/2019 .    Patient currently requires max with basic self-care skills secondary to muscle weakness and muscle paralysis, decreased cardiorespiratoy endurance, ataxia and decreased coordination and decreased sitting balance, decreased standing balance, decreased postural control, decreased balance strategies and difficulty maintaining precautions.  Prior to hospitalization, patient could complete ADLs and light IADLs with modified independent .  Patient will benefit from skilled intervention to decrease level of assist with basic self-care skills and increase independence with basic self-care skills prior to discharge home with paid caregivers.  Anticipate patient will require minimal physical assistance and follow up home health.  OT - End of Session Activity Tolerance: Tolerates 10 - 20 min activity with multiple rests Endurance Deficit: Yes Endurance Deficit Description: Required increased physical assist throughout session 2/2 fatigue OT Assessment Rehab Potential (ACUTE ONLY): Good OT Patient demonstrates impairments in the following area(s): Balance;Safety;Sensory;Endurance;Motor;Pain;Perception OT Basic ADL's Functional Problem(s): Grooming;Bathing;Dressing;Toileting OT Advanced ADL's Functional Problem(s): Simple Meal Preparation OT Transfers Functional Problem(s): Toilet;Tub/Shower OT Additional Impairment(s): None OT Plan OT Intensity: Minimum of 1-2 x/day, 45 to 90 minutes OT Frequency: 5 out of 7 days OT Duration/Estimated Length of Stay: 2.5-3 weeks OT Treatment/Interventions: Balance/vestibular training;Discharge planning;Pain management;Self Care/advanced ADL  retraining;Therapeutic Activities;UE/LE Coordination activities;Therapeutic Exercise;Patient/family education;Skin care/wound managment;Functional mobility training;Community reintegration;DME/adaptive equipment instruction;Psychosocial support;UE/LE Strength taining/ROM;Splinting/orthotics;Wheelchair propulsion/positioning OT Self Feeding Anticipated Outcome(s): Indep OT Basic Self-Care Anticipated Outcome(s): Min A OT Toileting Anticipated Outcome(s): CGA OT Bathroom Transfers Anticipated Outcome(s): CGA OT Recommendation Recommendations for Other Services: Neuropsych consult Patient destination: (Back to independent living facility) Follow Up Recommendations: Home health OT Equipment Recommended: To be determined   Skilled Therapeutic Intervention Session One: Pt seen for OT ADL bathing/dressing session. Pt awake in supine upon arrival with RN present administering pain medications, pt agreeable to tx session. She denied pain, reports being pre-medicated prior to tx session.  She transferred to sitting EOB with mod A using hospital bed functions with assist for management of B LEs, and VCs for long rolling technique. Mod A squat pivot transfer to w/c.  She completed grooming and UB bathing/dressing tasks from w/c level at sink with set-up/supervision. Mod A required for washing LEs as unable to reach lower legs without bending of back. Attempted to stand at sink, however, despite max A unable to power into standing. Used STEDY, pt able to stand from w/c with mod-max A into STEDY. She was unable to tolerate standing, >~5 seconds before requiring seated rest break. Attempted multiple times to stand from perched in STEDY, however, pt unable to clear buttock due to posterior pushing tenedencies, pt becoming increasingly anxious and resisting assistance. Ultimately required +3 to stand fully enough in order to remove STEDY seat pads and return to supine. Pt left in  supine at end of session with RN  present completing assessment.   Session Two: Pt seen for OT session focusing AE training and ADL re-training. Pt asleep in supine upon arrival, easily awoken and with encouragement agreeable to tx session despite fatigue. She transferred to sitting EOB with min A, VCs for technique. Completed min A sliding board transfer to w/c with VCs for technique. Pt introduced to reacher and sock aid for LB dressing tasks. Following demonstration, pt able to return demonstrate ability to doff socks using reacher and don socks with sock aid and min A.  She was able to recall 2/3 back pre-cautions this session. Voiced need for toileting task, used STEDY to transfer pt to [facility] over toilet. Completed sit>stand with mod A when standing from w/c, CGA when standing form perched position on STEDY, great improvement since AM session with decreased anxiety with movement noted. Pt able to void urine, unable to have BM. She returned to supine position in order to complete pericare per pt request. Pt left in supine with all needs in reach and bed alarm on.   OT Evaluation Precautions/Restrictions  Precautions Precautions: Fall;Back Required Braces or Orthoses: Spinal Brace Spinal Brace: Lumbar corset;Applied in sitting position Restrictions Weight Bearing Restrictions: No General Chart Reviewed: Yes Home Living/Prior [place] expects to be discharged to:: Private residence Living Arrangements: Alone Available Help at Discharge: Personal care attendant(Family plans to hire paid caregivers at d/c.) Type of Home: Independent living facility Home Access: Level entry [place]: One level Bathroom Shower/Tub: Multimedia programmer: Handicapped height Bathroom Accessibility: Yes  Lives With: Alone IADL History Homemaking Responsibilities: Yes Current License: No 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 Reports prior to 1 month ago, pt walking independently and caring for self. Vision Baseline Vision/History: Wears glasses(Has contact for R eye only) Wears Glasses: Reading only Patient Visual Report: No change from baseline Vision Assessment?: No apparent visual deficits Perception  Perception: Within Functional Limits Praxis Praxis: Intact Cognition Overall Cognitive Status: Impaired/Different from baseline Memory: Impaired Awareness: Impaired Safety/Judgment: Impaired Comments: mild safety and memory deficits following surgery, but slightly limited at baseline per pt report Sensation Sensation Light Touch: Impaired Detail Peripheral sensation comments: mild neuropathy in Bil toes. Coordination Gross Motor Movements are Fluid and Coordinated: No Finger Nose Finger Test: mild tremor in BUE symmetrical Heel Shin Test: limited Bil R>L due to strength deficits. Motor  Motor Motor: Paraplegia Motor - Skilled Clinical Observations: Bil LE weakness with mild sensory deficits. Mobility  Bed Mobility Bed Mobility: Rolling Right;Rolling Left;Supine to Sit;Sit to Supine Rolling Right: Minimal Assistance - Patient > 75% Rolling Left: Minimal Assistance - Patient > 75% Supine to Sit: Moderate Assistance - Patient 50-74% Sit to Supine: Maximal Assistance - Patient 25-49% Transfers Sit to Stand: Maximal Assistance - Patient 25-49%  Trunk/Postural Assessment  Cervical Assessment Cervical Assessment: Within Functional Limits Thoracic Assessment Thoracic Assessment: Exceptions to WFL(Back pre-cautions) Lumbar Assessment Lumbar Assessment: Exceptions to WFL(Lumbar corset) Postural Control Postural  Control: Deficits on evaluation  Balance Balance Balance Assessed: Yes Static Sitting Balance Static Sitting - Balance Support: Feet supported;Bilateral upper extremity supported Static Sitting - Level of Assistance: 4: Min assist Static  Sitting - Comment/# of Minutes: Sitting EOB Dynamic Sitting Balance Dynamic Sitting - Balance Support: During functional activity;Feet supported;Bilateral upper extremity supported Dynamic Sitting - Level of Assistance: 4: Min assist;3: Mod assist Sitting balance - Comments: Sitting EOB with B UE support Static Standing Balance Static Standing - Balance Support: Bilateral upper extremity supported Static Standing - Level of Assistance: Other (comment) Static Standing - Comment/# of Minutes: Standing in STEDY Extremity/Trunk Assessment RUE Assessment RUE Assessment: Within Functional Limits LUE Assessment LUE Assessment: Within Functional Limits     Refer to Care Plan for Long Term Goals  Recommendations for other services: Neuropsych   Discharge Criteria: Patient will be discharged from OT if patient refuses treatment 3 consecutive times without medical reason, if treatment goals not met, if there is a change in medical status, if patient makes no progress towards goals or if patient is discharged from hospital.  The above assessment, treatment plan, treatment alternatives and goals were discussed and mutually agreed upon: by patient  Tyreshia Ingman L 05/29/2019, 3:22 PM

## 2019-05-29 NOTE — Progress Notes (Signed)
[facility] PHYSICAL MEDICINE & REHABILITATION PROGRESS NOTE   Subjective/Complaints: Patient became anxious while getting up today that she felt dizzy. Nursing evaluated patient initial blood pressure was 120/70 in a supine position.  Discussed need to get orthostatic vital signs. Review of systems denies chest pain shortness of breath nausea vomiting diarrhea or constipation.   Objective:   Vas [place] Lower Extremity Venous (dvt)  Result Date: 05/28/2019  Lower Venous Study Indications: Swelling.  Risk Factors: Surgery. Comparison Study: No prior studies. Performing Technologist: Oliver Hum RVT  Examination Guidelines: A complete evaluation includes B-mode imaging, spectral Doppler, color Doppler, and power Doppler as needed of all accessible portions of each vessel. Bilateral testing is considered an integral part of a complete examination. Limited examinations for reoccurring indications may be performed as noted.  +---------+---------------+---------+-----------+----------+-------+ RIGHT    CompressibilityPhasicitySpontaneityPropertiesSummary +---------+---------------+---------+-----------+----------+-------+ CFV      Full           Yes      Yes                          +---------+---------------+---------+-----------+----------+-------+ SFJ      Full                                                 +---------+---------------+---------+-----------+----------+-------+ FV Prox  Full                                                 +---------+---------------+---------+-----------+----------+-------+ FV Mid   Full                                                 +---------+---------------+---------+-----------+----------+-------+ FV DistalFull                                                 +---------+---------------+---------+-----------+----------+-------+ PFV      Full                                                  +---------+---------------+---------+-----------+----------+-------+ POP      Partial        No       No                   Acute   +---------+---------------+---------+-----------+----------+-------+ PTV      Partial                                      Acute   +---------+---------------+---------+-----------+----------+-------+ PERO     Full                                                 +---------+---------------+---------+-----------+----------+-------+  Gastroc  Partial                                      Acute   +---------+---------------+---------+-----------+----------+-------+   +---------+---------------+---------+-----------+----------+-------+ LEFT     CompressibilityPhasicitySpontaneityPropertiesSummary +---------+---------------+---------+-----------+----------+-------+ CFV      Full           Yes      Yes                          +---------+---------------+---------+-----------+----------+-------+ SFJ      Full                                                 +---------+---------------+---------+-----------+----------+-------+ FV Prox  Full                                                 +---------+---------------+---------+-----------+----------+-------+ FV Mid   Full                                                 +---------+---------------+---------+-----------+----------+-------+ FV DistalFull                                                 +---------+---------------+---------+-----------+----------+-------+ PFV      Full                                                 +---------+---------------+---------+-----------+----------+-------+ POP      Full           Yes      Yes                          +---------+---------------+---------+-----------+----------+-------+ PTV      Full                                                 +---------+---------------+---------+-----------+----------+-------+ PERO      Partial                                      Acute   +---------+---------------+---------+-----------+----------+-------+ Gastroc  Partial                                      Acute   +---------+---------------+---------+-----------+----------+-------+     Summary: Right: Findings consistent with acute deep vein thrombosis involving the right popliteal vein, right posterior tibial veins, and right gastrocnemius veins. No cystic  structure found in the popliteal fossa. Left: Findings consistent with acute deep vein thrombosis involving the left peroneal veins, and left gastrocnemius veins. No cystic structure found in the popliteal fossa.  *See table(s) above for measurements and observations. Electronically signed by Deitra Mayo MD on 05/28/2019 at 5:02:01 PM.    Final    Recent Labs    05/28/19 1313 05/29/19 0540  WBC 8.6 7.6  HGB 9.9* 9.5*  HCT 29.3* 28.2*  PLT 240 247   Recent Labs    05/28/19 1313 05/29/19 0540  NA  --  139  K  --  3.5  CL  --  102  CO2  --  29  GLUCOSE  --  105*  BUN  --  9  CREATININE 0.66 0.83  CALCIUM  --  8.3*    Intake/Output Summary (Last 24 hours) at 05/29/2019 1002 Last data filed at 05/29/2019 0602 Gross per 24 hour  Intake 240 ml  Output 1675 ml  Net -1435 ml     Physical Exam: Vital Signs Blood pressure (!) 141/71, pulse 78, temperature 98.1 F (36.7 C), temperature source Oral, resp. rate 20, weight 73.4 kg, SpO2 97 %.   General: No acute distress Mood and affect are appropriate Heart: Regular rate and rhythm no rubs murmurs or extra sounds Lungs: Clear to auscultation, breathing unlabored, no rales or wheezes Abdomen: Positive bowel sounds, soft nontender to palpation, nondistended Extremities: No clubbing, cyanosis, or edema Skin: No evidence of breakdown, no evidence of rash Neurologic: Cranial nerves II through XII intact, motor strength is 5/5 in bilateral deltoid, bicep, tricep, grip, 2- bilateral hip flexor, 3- knee  extensors, ankle dorsiflexor and plantar flexor Sensory exam normal sensation to light touch and proprioception in bilateral upper and lower extremities Cerebellar exam normal finger to nose to finger as well as heel to shin in bilateral upper and lower extremities Musculoskeletal no pain with upper extremity or lower extremity range of motion no joint swelling   Assessment/Plan: 1. Functional deficits secondary to bilateral lumbar radiculopathies which require 3+ hours per day of interdisciplinary therapy in a comprehensive inpatient rehab setting.  Physiatrist is providing close team supervision and 24 hour management of active medical problems listed below.  Physiatrist and rehab team continue to assess barriers to discharge/monitor patient progress toward functional and medical goals  Care Tool:  Bathing              Bathing assist       Upper Body Dressing/Undressing Upper body dressing   What is the patient wearing?: Hospital gown only    Upper body assist Assist Level: Maximal Assistance - Patient 25 - 49%    Lower Body Dressing/Undressing Lower body dressing      What is the patient wearing?: Incontinence brief     Lower body assist Assist for lower body dressing: Total Assistance - Patient < 25%     Toileting Toileting    Toileting assist Assist for toileting: 2 Helpers     Transfers Chair/bed transfer  Transfers assist  Chair/bed transfer activity did not occur: Safety/medical concerns        Locomotion Ambulation   Ambulation assist              Walk 10 feet activity   Assist           Walk 50 feet activity   Assist           Walk 150 feet activity   Assist  Walk 10 feet on uneven surface  activity   Assist           Wheelchair     Assist               Wheelchair 50 feet with 2 turns activity    Assist            Wheelchair 150 feet activity     Assist           Medical Problem List and Plan: 1.  Decreased functional mobility secondary to lumbar radiculopathy, HNP L3-4.  Status post redo decompressive lumbar laminectomy L3-4 posterior lumbar interbody fusion 05/23/2019. Back brace when out of bed           CIR evals PT, OT 2.  Antithrombotics: -RIght acute popliteal DVT now on Xarelto  Subcutaneous heparin.  Lower extremity Dopplers ordered             -antiplatelet therapy: Aspirin 325 mg daily 3. Pain Management: Flexeril and oxycodone as needed.  Attempt to minimize pain medications due to alterations in cognition. 4. Mood: Valium 5 mg twice daily as needed anxiety             -antipsychotic agents: N/A 5. Neuropsych: This patient is capable of making decisions on her own behalf. 6. Skin/Wound Care: Routine skin checks 7. Fluids/Electrolytes/Nutrition: Routine I/Os.  CMP ordered for tomorrow a.m. 8.  Hypertension: Cozaar 50 mg daily, Norvasc 5 mg daily.  Monitor with increased mobility. Vitals:   05/29/19 0902 05/29/19 1010  BP: (!) 141/71 121/60  Pulse: 78 93  Resp: 20   Temp:    SpO2: 97% 99%   9.  Hyperlipidemia: Continue Lipitor 10.  History of TIA.  Continue aspirin 11.  Hypothyroidism.  Synthroid 12.  Constipation.  Laxative assistance    LOS: 1 days A FACE TO FACE EVALUATION WAS PERFORMED  Charlett Blake 05/29/2019, 10:02 AM

## 2019-05-29 NOTE — Evaluation (Signed)
Physical Therapy Assessment and Plan  Patient Details  Name: Tara [name] MRN: [id number] Date of Birth: [birth year]  PT Diagnosis: Abnormal posture, Abnormality of gait, Difficulty walking and Muscle weakness Rehab Potential: Excellent ELOS: 18-21 days   Today's Date: 05/29/2019 PT Individual Time: 1105-1200 PT Individual Time Calculation (min): 55 min    Problem List:  Patient Active Problem List   Diagnosis Date Noted  . Lumbar radiculopathy 05/28/2019  . Postoperative pain   . Drug induced constipation   . History of TIA (transient ischemic attack)   . Lumbar spinal stenosis 05/23/2019  . HNP (herniated nucleus pulposus), lumbar 05/23/2019  . TIA (transient ischemic attack) 07/02/2018  . Chronic nonintractable headache 07/02/2018  . S/P total knee replacement 12/25/2017  . Ductal carcinoma in situ of right breast 08/17/2015  . Breast cancer of upper-outer quadrant of right female breast ([place]) 08/14/2015  . Right hip pain 10/27/2012  . Low back pain 10/27/2012  . Hypertension   . Osteoarthritis of right knee 11/30/2011  . Baker's cyst of knee 11/30/2011  . Dependent edema 06/19/2011  . Prediabetes 06/19/2011  . Urinary tract infection, recurrent 06/19/2011  . Urinary incontinence 06/19/2011  . Benign positional vertigo 06/19/2011  . Hypothyroidism 06/19/2011  . Tubular adenoma of colon 06/19/2011  . Essential hypertension, benign 04/15/2011    Past Medical History:  Past Medical History:  Diagnosis Date  . Arthritis    knees  . Breast cancer ([place]) 09/30/15   right breast  . Dependent edema   . Depression    just lost spouse in Nov. 2018  . Essential hypertension, benign 04/15/2011  . Herniated lumbar disc without myelopathy   . Hx of staphylococcal infection   . Hypertension   . Hypothyroidism   . Obesity   . Pre-diabetes   . Radicular pain   . Recurrent UTI   . Stroke [facility])    ' mini"  . Thyroid disease    hypothyroidism  . Vertigo   . Wears  contact lenses    one   Past Surgical History:  Past Surgical History:  Procedure Laterality Date  . ANKLE SURGERY  1994   RIGHT  . BACK SURGERY    . BREAST LUMPECTOMY WITH NEEDLE LOCALIZATION Right 09/30/2015   Procedure: RIGHT BREAST LUMPECTOMY WITH NEEDLE LOCALIZATION;  Surgeon: Fanny Skates, MD;  Location: [place];  Service: General;  Laterality: Right;  . BREAST SURGERY    . COLONOSCOPY W/ BIOPSIES AND POLYPECTOMY    . EYE SURGERY     bilateral  . HAND SURGERY  1951   RIGHT HAND  . JOINT REPLACEMENT     rt shoulder rotator cuff  . KNEE ARTHROSCOPY     left knee  . LUMBAR LAMINECTOMY/DECOMPRESSION MICRODISCECTOMY Right 01/11/2013   Procedure: LUMBAR LAMINECTOMY/DECOMPRESSION MICRODISCECTOMY 1 LEVEL,Right Lumbar Three-Four;  Surgeon: Elaina Hoops, MD;  Location: [place] [facility];  Service: Neurosurgery;  Laterality: Right;  . ROTATOR CUFF REPAIR  2011   RIGHT  . TONSILLECTOMY    . TOTAL KNEE ARTHROPLASTY     Left  . TOTAL KNEE ARTHROPLASTY Right 12/25/2017   Procedure: TOTAL KNEE ARTHROPLASTY;  Surgeon: Vickey Huger, MD;  Location: [place];  Service: Orthopedics;  Laterality: Right;    Assessment & Plan Clinical Impression: Patient is a 83 year old right-handed female with history of hypertension, hypothyroidism,left TKA 2019, TIA maintained on aspirin, lumbar laminectomy with decompression 2014.  History taken from chart review and patient.  Chart review, patient was alone  in an independent living facility/[facility].  Patient used a walker and quad cane prior to admission.  Presented 05/23/2019 with low back pain radiating to the left lower extremity.  Work-up and imaging revealed critical stenosis L3-4  With disc herniation and possible recurrence of synovial cyst with radiculopathy.  Underwent redo decompressive lumbar laminectomy L3-4 with complete medial facetectomies and radical foraminotomies of the L4 and L3 nerve roots posterior lumbar interbody fusion 05/23/2019  per Dr. Saintclair Halsted.  Hospital course complicated by pain.  Routine back precautions with back brace when out of bed. Patient transferred to CIR on 05/28/2019 .   Patient currently requires max with mobility secondary to muscle weakness, muscle joint tightness and muscle paralysis, decreased cardiorespiratoy endurance, unbalanced muscle activation, decreased problem solving, decreased safety awareness, decreased memory and delayed processing and decreased sitting balance, decreased standing balance, decreased postural control, decreased balance strategies and difficulty maintaining precautions.  Prior to hospitalization, patient was modified independent  with mobility and lived with Alone in a Independent living facility home.  Home access is  Level entry.  Patient will benefit from skilled PT intervention to maximize safe functional mobility, minimize fall risk and decrease caregiver burden for planned discharge home with 24 hour assist.  Anticipate patient will benefit from follow up [facility] at discharge.  PT - End of Session Activity Tolerance: Tolerates 10 - 20 min activity with multiple rests Endurance Deficit: Yes PT Assessment Rehab Potential (ACUTE/IP ONLY): Excellent PT Barriers to Discharge: Decreased caregiver support;Home environment access/layout PT Patient demonstrates impairments in the following area(s): Balance;Behavior;Endurance;Motor;Pain;Perception;Safety;Sensory;Skin Integrity PT Transfers Functional Problem(s): Bed Mobility;Bed to Chair;Car;Furniture;Floor PT Locomotion Functional Problem(s): Wheelchair Mobility;Stairs;Ambulation PT Plan PT Intensity: Minimum of 1-2 x/day ,45 to 90 minutes PT Frequency: 5 out of 7 days PT Duration Estimated Length of Stay: 18-21 days PT Treatment/Interventions: Ambulation/gait training;Balance/vestibular training;Cognitive remediation/compensation;Community reintegration;Functional electrical stimulation;Disease management/prevention;Discharge  planning;Functional mobility training;Neuromuscular re-education;Pain management;DME/adaptive equipment instruction;Patient/family education;Stair training;Splinting/orthotics;Psychosocial support;Therapeutic Activities;Skin care/wound management;Therapeutic Exercise;UE/LE Strength taining/ROM;UE/LE Coordination activities;Visual/perceptual remediation/compensation;Wheelchair propulsion/positioning PT Transfers Anticipated Outcome(s): Min assist with LRAD PT Locomotion Anticipated Outcome(s): Min assist with LRAD at ambulatory level PT Recommendation Recommendations for Other Services: Therapeutic Recreation consult Therapeutic Recreation Interventions: Stress management;Kitchen group Follow Up Recommendations: Home health PT Patient destination: Home Equipment Recommended: Wheelchair (measurements);Wheelchair cushion (measurements)  Skilled Therapeutic Intervention Pt received supine in bed and agreeable to PT. Supine>sit transfer with max assist and max cues for log roll technique. PT instructed patient in PT Evaluation and initiated treatment intervention; see below for results. PT educated patient in [place], rehab potential, rehab goals, and discharge recommendations. Max assist squat pivot to WC from EOB. Sit<>stand in parallel bars with mod assist and PT blocking the RLE. Max assist outside parallel bars. Gait training in parallel bars x 2 ft with max assist to block the RLE to prevent LOB from knee buckle. WC mobility as listed below . Pt returned to room and performed SB transfer to bed with mod assist and max cues for safety.  Sit>supine completed with mod assist to assist BLE, and left supine in bed with call bell in reach and all needs met.     PT Evaluation Precautions/Restrictions   back. Fall  General   Vital SignsTherapy Vitals Pulse Rate: 93 Resp: 20 BP: 121/60 Patient Position (if appropriate): Lying Oxygen Therapy SpO2: 99 % O2 Device: Room Air Pain   denies at rest  [place] Available Help at Discharge: Personal care attendant(Family plans to hire paid caregivers at d/c.)  Type of Home: Independent living facility Home Access: Level entry Home Layout: One level Bathroom Shower/Tub: Multimedia programmer: Handicapped height Bathroom Accessibility: Yes  Lives With: Alone Prior Function Level of Independence: Independent with basic ADLs;Independent with homemaking with ambulation;Requires assistive device for independence(Used SPC and RW PTA)  Able to Take Stairs?: Yes Driving: No Comments: Has needed help the last few weeks with transferring and getting to the bathroom due to back pain. Reports prior to 1 month ago, pt walking independently and caring for self. Vision/Perception  Perception Perception: Within Functional Limits  Cognition Overall Cognitive Status: Impaired/Different from baseline Orientation Level: Oriented X4 Memory: Impaired Awareness: Impaired Safety/Judgment: Impaired Comments: mild safety and memory deficits following surgery, but slightly limited at baseline per pt report Sensation Sensation Light Touch: Impaired Detail Peripheral sensation comments: mild neuropathy in Bil toes. Coordination Gross Motor Movements are Fluid and Coordinated: No Finger Nose Finger Test: mild tremor in BUE symmetrical Heel Shin Test: limited Bil R>L due to strength deficits. Motor  Motor Motor: Paraplegia Motor - Skilled Clinical Observations: Bil LE weakness with mild sensory deficits.  Mobility Bed Mobility Bed Mobility: Rolling Right;Rolling Left;Supine to Sit;Sit to Supine Rolling Right: Minimal Assistance - Patient > 75% Rolling Left: Minimal Assistance - Patient > 75% Supine to Sit: Moderate Assistance - Patient 50-74% Sit to Supine: Maximal Assistance - Patient 25-49% Transfers Transfers: Sit to [employer] Pivot Transfers Sit to Stand: Maximal Assistance - Patient 25-49% Squat Pivot  Transfers: Maximal Assistance - Patient 25-49% Locomotion  Gait Ambulation: Yes Gait Assistance: Maximal Assistance - Patient 25-49% Gait Distance (Feet): 3 Feet Assistive device: Parallel bars Gait Gait: Yes Gait Pattern: Impaired Gait Pattern: Right flexed knee in stance;Lateral hip instability;Wide base of support Stairs / Additional Locomotion Stairs: No Architect: Yes Wheelchair Assistance: Minimal assistance - Patient >75% Environmental health practitioner: Both upper extremities Wheelchair Parts Management: Needs assistance Distance: 150  Trunk/Postural Assessment  Cervical Assessment Cervical Assessment: Within Functional Limits Thoracic Assessment Thoracic Assessment: Within Functional Limits Lumbar Assessment Lumbar Assessment: Exceptions to WFL(lumbar corset following surgery) Postural Control Postural Control: Deficits on evaluation(posterior bias)  Balance Balance Balance Assessed: Yes Static Sitting Balance Static Sitting - Balance Support: Feet supported;Bilateral upper extremity supported Static Sitting - Level of Assistance: 4: Min assist Static Sitting - Comment/# of Minutes: Sitting EOB Dynamic Sitting Balance Dynamic Sitting - Balance Support: During functional activity;Feet supported;Bilateral upper extremity supported Dynamic Sitting - Level of Assistance: 4: Min assist;3: Mod assist Sitting balance - Comments: Sitting EOB with B UE support Static Standing Balance Static Standing - Balance Support: Bilateral upper extremity supported Static Standing - Level of Assistance: 2: Max assist;3: Mod assist Static Standing - Comment/# of Minutes: standing in parallel bars Dynamic Standing Balance Dynamic Standing - Level of Assistance: 2: Max assist Dynamic Standing - Balance Activities: Lateral lean/weight shifting Extremity Assessment      RLE Assessment RLE Assessment: Exceptions to [facility] General Strength Comments: 2+/5 knee  extension. 3/5 hip flexion, knee flexion and ankle DF/PF LLE Assessment LLE Assessment: Exceptions to [facility] General Strength Comments: grossly 4-/5 proximal to distal except ankle PF 3/5    Refer to Care Plan for Long Term Goals  Recommendations for other services: Therapeutic Recreation  Stress management and Outing/community reintegration  Discharge Criteria: Patient will be discharged from PT if patient refuses treatment 3 consecutive times without medical reason, if treatment goals not met, if there is a change in medical status, if patient makes no progress towards goals  or if patient is discharged from hospital.  The above assessment, treatment plan, treatment alternatives and goals were discussed and mutually agreed upon: by patient  Lorie Phenix 05/29/2019, 11:57 AM

## 2019-05-30 ENCOUNTER — Inpatient Hospital Stay (HOSPITAL_COMMUNITY): Payer: Medicare Other | Admitting: Physical Therapy

## 2019-05-30 ENCOUNTER — Inpatient Hospital Stay (HOSPITAL_COMMUNITY): Payer: Medicare Other | Admitting: Occupational Therapy

## 2019-05-30 MED ORDER — OXYCODONE HCL 5 MG PO TABS
5.0000 mg | ORAL_TABLET | ORAL | Status: DC | PRN
  Administered 2019-06-03: 5 mg via ORAL
  Filled 2019-05-30 (×2): qty 1

## 2019-05-30 MED ORDER — TRAMADOL HCL 50 MG PO TABS
50.0000 mg | ORAL_TABLET | Freq: Four times a day (QID) | ORAL | Status: DC | PRN
  Administered 2019-05-30 – 2019-06-19 (×18): 50 mg via ORAL
  Filled 2019-05-30 (×26): qty 1

## 2019-05-30 NOTE — Progress Notes (Signed)
Was given in report that patient had some episodes of orthostasis yesterday & was thought to be due to oxycodone. She was given tylenol & valium during the day & was thought to had been better to control her pain. During her night time meds, patient was given the valium, it was not time for the tylenol yet. She complained of no pain at the time. She woke up at around midnight c/o pain & was given the tylenol for back pain 8/10. She again asked for pain medication at around 0200. The oxycodone was given. A kpad was ordered & received. This morning at approximately 0600, she was asleep & was waken to give her synthroid. No c/o pain at the time & kpad was in place & functioning. No acute distress noted. Will pass on to oncoming nurse.

## 2019-05-30 NOTE — Progress Notes (Signed)
Iva for xarelto Indication: VTE treatment  Allergies  Allergen Reactions  . Ceclor [Cefaclor] Hives and Itching    Patient Measurements: Height: 5\' 3"  (160 cm) Weight: 161 lb 13.4 oz (73.4 kg) IBW/kg (Calculated) : 52.4  Vital Signs: Temp: 97.4 F (36.3 C) (07/09 0511) Temp Source: Oral (07/09 0511) BP: 148/63 (07/09 0511) Pulse Rate: 64 (07/09 0511)  Labs: Recent Labs    05/28/19 1313 05/29/19 0540  HGB 9.9* 9.5*  HCT 29.3* 28.2*  PLT 240 247  CREATININE 0.66 0.83    Estimated Creatinine Clearance: 51.9 mL/min (by C-G formula based on SCr of 0.83 mg/dL).   Medical History: Past Medical History:  Diagnosis Date  . Arthritis    knees  . Breast cancer ([place]) 09/30/15   right breast  . Dependent edema   . Depression    just lost spouse in Nov. 2018  . Essential hypertension, benign 04/15/2011  . Herniated lumbar disc without myelopathy   . Hx of staphylococcal infection   . Hypertension   . Hypothyroidism   . Obesity   . Pre-diabetes   . Radicular pain   . Recurrent UTI   . Stroke [facility])    ' mini"  . Thyroid disease    hypothyroidism  . Vertigo   . Wears contact lenses    one   Assessment: 83 yo patient with Bilateral lower extremity Dopplers today 05/28/2019 show DVT right popliteal vein, right posterior tibial vein and right gastrocnemius vein as well as left lower extremity DVT left peroneal vein and left gastroc vein. Patient cleared by N surg to start xarelto per Rehab PA note.   Renal function stable, H/H low but stable and no reports of bleeding at this time.    Goal of Therapy:  Monitor platelets by anticoagulation protocol: Yes   Plan:  Continue Xarelto 15mg  PO BID (total 21 days) until 7/28 and start 20mg  PO daily Continue to monitor renal function, s/s bleeding  Bertis Ruddy, PharmD Clinical Pharmacist Please check AMION for all [place] numbers 05/30/2019 9:47 AM

## 2019-05-30 NOTE — IPOC Note (Signed)
Overall Plan of Care [facility]) Patient Details Name: LARI LINSON MRN: [id number] DOB: 02-21-1936  Admitting Diagnosis: <principal problem not specified>  Hospital Problems: Active Problems:   Lumbar radiculopathy     Functional Problem List: Nursing Bladder, Bowel, Endurance, Medication Management, Motor, Pain, Skin Integrity  PT Balance, Behavior, Endurance, Motor, Pain, Perception, Safety, Sensory, Skin Integrity  OT Balance, Safety, Sensory, Endurance, Motor, Pain, Perception  SLP    TR         Basic ADL's: OT Grooming, Bathing, Dressing, Toileting     Advanced  ADL's: OT Simple Meal Preparation     Transfers: PT Bed Mobility, Bed to Chair, Car, Furniture, Floor  OT Toilet, Metallurgist: PT Emergency planning/management officer, Stairs, Ambulation     Additional Impairments: OT None  SLP        TR      Anticipated Outcomes Item Anticipated Outcome  Self Feeding Indep  Swallowing      Basic self-care  Min A  Toileting  CGA   Bathroom Transfers CGA  Bowel/Bladder  Pt will manage bowel and bladder with min assist at discharge  Transfers  Min assist with LRAD  Locomotion  Min assist with LRAD at ambulatory level  Communication     Cognition     Pain  Pt will manage pain at 3 or less on a scale of 0-10.  Safety/Judgment  Pt will remain free of falls with injury while in rehab with min assist/cues.   Therapy Plan: PT Intensity: Minimum of 1-2 x/day ,45 to 90 minutes PT Frequency: 5 out of 7 days PT Duration Estimated Length of Stay: 18-21 days OT Intensity: Minimum of 1-2 x/day, 45 to 90 minutes OT Frequency: 5 out of 7 days OT Duration/Estimated Length of Stay: 2.5-3 weeks     Due to the current state of emergency, patients may not be receiving their 3-hours of Medicare-mandated therapy.   Team Interventions: Nursing Interventions Patient/Family Education, Bladder Management, Bowel Management, Pain Management, Medication Management, Skin  Care/Wound Management, Discharge Planning  PT interventions Ambulation/gait training, Balance/vestibular training, Cognitive remediation/compensation, Community reintegration, Functional electrical stimulation, Disease management/prevention, Discharge planning, Functional mobility training, Neuromuscular re-education, Pain management, DME/adaptive equipment instruction, Patient/family education, Stair training, Splinting/orthotics, Psychosocial support, Therapeutic Activities, Skin care/wound management, Therapeutic Exercise, UE/LE Strength taining/ROM, UE/LE Coordination activities, Visual/perceptual remediation/compensation, Wheelchair propulsion/positioning  OT Interventions Balance/vestibular training, Discharge planning, Pain management, Self Care/advanced ADL retraining, Therapeutic Activities, UE/LE Coordination activities, Therapeutic Exercise, Patient/family education, Skin care/wound managment, Functional mobility training, Community reintegration, Engineer, drilling, Psychosocial support, UE/LE Strength taining/ROM, Splinting/orthotics, Wheelchair propulsion/positioning  SLP Interventions    TR Interventions    SW/CM Interventions Discharge Planning, Patient/Family Education, Psychosocial Support   Barriers to Discharge MD  Medical stability and pain med side effects  Nursing      PT Decreased caregiver support, Home environment Child psychotherapist    OT      SLP      SW       Team Discharge Planning: Destination: PT-Home ,OT- (Back to independent living facility) , SLP-  Projected Follow-up: PT-Home health PT, OT-  Home health OT, SLP-  Projected Equipment Needs: PT-Wheelchair (measurements), Wheelchair cushion (measurements), OT- To be determined, SLP-  Equipment Details: PT- , OT-  Patient/family involved in discharge planning: PT- Patient,  OT-Patient, SLP-   MD ELOS: 14-18d Medical Rehab Prognosis:  Good Assessment:   83 year old right-handed female with history  of hypertension, hypothyroidism,left TKA 2019, TIA maintained on aspirin, lumbar laminectomy  with decompression 2014.  History taken from chart review and patient.  Chart review, patient was alone in an independent living facility/[facility].  Patient used a walker and quad cane prior to admission.  Presented 05/23/2019 with low back pain radiating to the left lower extremity.  Work-up and imaging revealed critical stenosis L3-4  With disc herniation and possible recurrence of synovial cyst with radiculopathy.  Underwent redo decompressive lumbar laminectomy L3-4 with complete medial facetectomies and radical foraminotomies of the L4 and L3 nerve roots posterior lumbar interbody fusion 05/23/2019 per Dr. Saintclair Halsted.  Hospital course complicated by pain.  Routine back precautions with back brace when out of bed   Now requiring 24/7 Rehab RN,MD, as well as CIR level PT, OT and SLP.  Treatment team will focus on ADLs and mobility with goals set at MinA/Sup  See Team Conference Notes for weekly updates to the plan of care

## 2019-05-30 NOTE — Progress Notes (Signed)
Physical Therapy Session Note  Patient Details  Name: Tara [name] MRN: [id number] Date of Birth: 09-18-1936  Today's Date: 05/30/2019 PT Individual Time: 1000-1100 PT Individual Time Calculation (min): 60 min   Short Term Goals: Week 1:  PT Short Term Goal 1 (Week 1): Pt will perform bed<>WC transfers with mod assist consistently PT Short Term Goal 2 (Week 1): Pt will perform all bed mobility with min assist PT Short Term Goal 3 (Week 1): Pt will ambulate 42ft with LRAD and mod assist PT Short Term Goal 4 (Week 1): Pt will propell WC 15ft with supervision assist  Skilled Therapeutic Interventions/Progress Updates:    Pt received seated in w/c at sink finishing her makeup, agreeable to PT session. Pt reports pain down LLE, not rated. Pt also reports feeling dizzy and nauseous due to fatigue and not sleeping well last night. Pt brushes her teeth and applies her lipstick while seated in w/c at sink with setup A. Seated BP 110/88. Pt takes a ginger drop for her nausea. Dependent transport to therapy gym via w/c for time conservation. Sit to stand with mod A in // bars. Pt is able to take one step forwards with RLE, demonstrates poor control of RLE. Pt reports urge to have a bowel movement and requests to return to her room. Stedy transfer w/c to toilet. Pt unable to void once on toilet and requests to return to bed due to increase in dizziness. Stedy transfer toilet to bed. Sit to supine mod A for BLE management. Pt left semi-reclined in bed with needs in reach at end of session.  Therapy Documentation Precautions:  Precautions Precautions: Fall, Back Required Braces or Orthoses: Spinal Brace Spinal Brace: Lumbar corset, Applied in sitting position Restrictions Weight Bearing Restrictions: No    Therapy/Group: Individual Therapy   Excell Seltzer, PT, DPT  05/30/2019, 12:53 PM

## 2019-05-30 NOTE — Progress Notes (Signed)
Patient has had a productive day and is pleased with her progress.  She did not require catheterization after the initial emptying this morning around 0800.  Her bowels have also moved today, which is a relief for her.  Discussed pain regimen with Dr. Letta Pate as patient is reluctant to take any oxycodone unless absolutely necessary due to the dizziness it causes her.  Tramadol added and given with no adverse reactions.  Will continue to monitor.  Brita Romp, RN
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
## 2019-05-30 NOTE — Plan of Care (Signed)
  Problem: Consults Goal: RH SPINAL CORD INJURY PATIENT EDUCATION Description:  See Patient Education module for education specifics.  Outcome: Progressing   Problem: SCI BOWEL ELIMINATION Goal: RH STG MANAGE BOWEL WITH ASSISTANCE Description: STG Manage Bowel with min Assistance. Outcome: Progressing   Problem: SCI BLADDER ELIMINATION Goal: RH STG MANAGE BLADDER WITH ASSISTANCE Description: STG Manage Bladder With Min Assistance Outcome: Progressing   Problem: RH SKIN INTEGRITY Goal: RH STG SKIN FREE OF INFECTION/BREAKDOWN Description: No new breakdown with mod I assist Outcome: Progressing Goal: RH STG ABLE TO PERFORM INCISION/WOUND CARE W/ASSISTANCE Description: STG Able To Perform Incision/Wound Care With [employer]. Outcome: Progressing   Problem: RH SAFETY Goal: RH STG ADHERE TO SAFETY PRECAUTIONS W/ASSISTANCE/DEVICE Description: STG Adhere to Safety Precautions With Mod I Assistance/Device. Outcome: Progressing   Problem: RH PAIN MANAGEMENT Goal: RH STG PAIN MANAGED AT OR BELOW PT'S PAIN GOAL Description: < 3 out of 10.  Outcome: Progressing

## 2019-05-30 NOTE — Progress Notes (Signed)
Occupational Therapy Session Note  Patient Details  Name: Tara [name] MRN: [id number] Date of Birth: 06/12/1936  Today's Date: 05/30/2019 OT Individual Time: 4825-0037 and 0488-8916 OT Individual Time Calculation (min): 45 min and 71 min   Short Term Goals: Week 1:  OT Short Term Goal 1 (Week 1): Pt will consistently complete functional transfers with mod A using LRAD OT Short Term Goal 2 (Week 1): Pt will complete LB dressing with min A using AE PRN OT Short Term Goal 3 (Week 1): Pt will recall 3/3 back pre-cautions independently. OT Short Term Goal 4 (Week 1): Pt will bathe with min A using AE PRN  Skilled Therapeutic Interventions/Progress Updates:    Session one: Pt seen for OT ADL bathing/dressing session. PT awake in supine upon arrival, voiced pain 4/10, in back, denied need for intervention. She transferred to sitting EOB with min A using hospital bed functions. Mod A squat pivot transfer to w/c. She completed UB bathing/dressing and grooming tasks from w/c level with assist to obtain items and VCs for adherence to back pre-cautions as pt twisting in w/c when directing therapist to retrieve items. She was able to independently recall 3/3 back pre-cautions today.  She was able to indepently recall use of sock aid and donned socks with min A.  Voiced need for toileting task. Used STEDY to transfer to [facility] over toilet. Total A for clothing management. Pt left seated on toilet at end of session, educated regarding pull cord call bell.   Session Two: PT seen for OT session focusing on functional transfers and sit>stand in prep for functional tasks.  Pt in supine upon arrival, initially declining therapy due to "vertigo" symptoms. Discussed at length pt's hx of vertigo and various tx methods in the past. Pt agreeable to vestibular assessment while on IPR. She voiced need for toiletting task and requesting use of BSC. Min-mod A squat pivot transfer to [facility]. Continent B/B void. Instructed in  lateral leans and pt able to complete pericare/buttock hygiene with steadying assist for dynamic sitting balance.  Completed min A sliding board transfer to w/c from [facility].  Pt taken to therapy gym, completed x4 sit>stands in // bars. Noted limited WBing through R LE, decreased proprioceptive input needed for midline weight shift and mod A for controlled sit. Rest break required btwn each trial. She self propelled w/c ~12ft back towards room at end of session for UE strengthening. With encouragement, pt willing to stay sitting up in recliner at end of session, using STEDY with min A transferred to recliner. Pt left seated in recliner at end of session with all needs in reach.    Therapy Documentation Precautions:  Precautions Precautions: Fall, Back Required Braces or Orthoses: Spinal Brace Spinal Brace: Lumbar corset, Applied in sitting position Restrictions Weight Bearing Restrictions: No   Therapy/Group: Individual Therapy  Ferlando Lia L 05/30/2019, 6:53 AM

## 2019-05-31 ENCOUNTER — Inpatient Hospital Stay (HOSPITAL_COMMUNITY): Payer: Medicare Other | Admitting: Occupational Therapy

## 2019-05-31 ENCOUNTER — Inpatient Hospital Stay (HOSPITAL_COMMUNITY): Payer: Medicare Other | Admitting: Physical Therapy

## 2019-05-31 MED ORDER — LOSARTAN POTASSIUM 50 MG PO TABS: 100.0000 mg | ORAL_TABLET | Freq: Every day | ORAL | Status: DC

## 2019-05-31 MED ORDER — LOSARTAN POTASSIUM 50 MG PO TABS
50.0000 mg | ORAL_TABLET | Freq: Every day | ORAL | Status: DC
  Administered 2019-06-01 – 2019-06-19 (×19): 50 mg via ORAL
  Filled 2019-05-31 (×19): qty 1

## 2019-05-31 MED ORDER — AMLODIPINE BESYLATE 10 MG PO TABS
10.0000 mg | ORAL_TABLET | Freq: Every day | ORAL | Status: DC
  Administered 2019-06-01 – 2019-06-19 (×19): 10 mg via ORAL
  Filled 2019-05-31 (×19): qty 1
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
## 2019-05-31 NOTE — Progress Notes (Signed)
Time: 1045-1145 60 minutes 1:1 No c/o pain. Vestibular eval completed. Pt with moderate symptoms with all eye movement and gaze stabilization. Pt with significant history of vertigo and good understanding of treatments and outcomes. Pt given handout for gaze stabilization and occulomotor exercises with pt demonstrating understanding. Pt with no visible nystagmus with sidelying but positive symptoms. May need further in depth testing.      05/31/19 0001  Symptom Behavior  Type of Dizziness  Vertigo  Frequency of Dizziness  (frequent)  Duration of Dizziness less than 1 minute  Aggravating Factors Spontaneous onset  Relieving Factors No known relieving factors  Progression of Symptoms No change since onset  History of similar episodes since 1963  Oculomotor Exam  Oculomotor Alignment Normal  Ocular ROM WFL  Spontaneous Absent  Gaze-induced  Absent;Age appropriate nystagmus at end range  Smooth Pursuits Saccades  Saccades Poor trajectory  Vestibulo-Ocular Reflex  VOR 1 Head Only (x 1 viewing) positive symptoms  VOR 2 Head and Object (x 2 viewing) positive symptoms  VOR to Slow Head Movement Positive bilaterally  VOR Cancellation Unable to maintain gaze  Positional Testing  Sidelying Test Sidelying Right;Sidelying Left  Sidelying Right  Sidelying Right Duration 1 minute  Sidelying Right Symptoms No nystagmus (positive for dizziness)  Sidelying Left  Sidelying Left Duration 1 minute  Sidelying Left Symptoms No nystagmus (positive for dizziness)

## 2019-05-31 NOTE — Progress Notes (Signed)
Social Work Assessment and Plan   Patient Details  Name: Tara [name] MRN: [id number] Date of Birth: 01/26/1936  Today's Date: 05/31/2019  Problem List:  Patient Active Problem List   Diagnosis Date Noted  . Anxiety state   . Acute blood loss anemia   . Lumbar radiculopathy 05/28/2019  . Postoperative pain   . Drug induced constipation   . History of TIA (transient ischemic attack)   . Lumbar spinal stenosis 05/23/2019  . HNP (herniated nucleus pulposus), lumbar 05/23/2019  . TIA (transient ischemic attack) 07/02/2018  . Chronic nonintractable headache 07/02/2018  . S/P total knee replacement 12/25/2017  . Ductal carcinoma in situ of right breast 08/17/2015  . Breast cancer of upper-outer quadrant of right female breast ([place]) 08/14/2015  . Right hip pain 10/27/2012  . Low back pain 10/27/2012  . Hypertension   . Osteoarthritis of right knee 11/30/2011  . Baker's cyst of knee 11/30/2011  . Dependent edema 06/19/2011  . Prediabetes 06/19/2011  . Urinary tract infection, recurrent 06/19/2011  . Urinary incontinence 06/19/2011  . Benign positional vertigo 06/19/2011  . Hypothyroidism 06/19/2011  . Tubular adenoma of colon 06/19/2011  . Essential hypertension, benign 04/15/2011   Past Medical History:  Past Medical History:  Diagnosis Date  . Arthritis    knees  . Breast cancer ([place]) 09/30/15   right breast  . Dependent edema   . Depression    just lost spouse in Nov. 2018  . Essential hypertension, benign 04/15/2011  . Herniated lumbar disc without myelopathy   . Hx of staphylococcal infection   . Hypertension   . Hypothyroidism   . Obesity   . Pre-diabetes   . Radicular pain   . Recurrent UTI   . Stroke [facility])    ' mini"  . Thyroid disease    hypothyroidism  . Vertigo   . Wears contact lenses    one   Past Surgical History:  Past Surgical History:  Procedure Laterality Date  . ANKLE SURGERY  1994   RIGHT  . BACK SURGERY    . BREAST LUMPECTOMY WITH  NEEDLE LOCALIZATION Right 09/30/2015   Procedure: RIGHT BREAST LUMPECTOMY WITH NEEDLE LOCALIZATION;  Surgeon: Fanny Skates, MD;  Location: [facility];  Service: General;  Laterality: Right;  . BREAST SURGERY    . COLONOSCOPY W/ BIOPSIES AND POLYPECTOMY    . EYE SURGERY     bilateral  . HAND SURGERY  1951   RIGHT HAND  . JOINT REPLACEMENT     rt shoulder rotator cuff  . KNEE ARTHROSCOPY     left knee  . LUMBAR LAMINECTOMY/DECOMPRESSION MICRODISCECTOMY Right 01/11/2013   Procedure: LUMBAR LAMINECTOMY/DECOMPRESSION MICRODISCECTOMY 1 LEVEL,Right Lumbar Three-Four;  Surgeon: Elaina Hoops, MD;  Location: [facility];  Service: Neurosurgery;  Laterality: Right;  . ROTATOR CUFF REPAIR  2011   RIGHT  . TONSILLECTOMY    . TOTAL KNEE ARTHROPLASTY     Left  . TOTAL KNEE ARTHROPLASTY Right 12/25/2017   Procedure: TOTAL KNEE ARTHROPLASTY;  Surgeon: Vickey Huger, MD;  Location: [facility];  Service: Orthopedics;  Laterality: Right;   Social History:  reports that she has never smoked. She has never used smokeless tobacco. She reports that she does not drink alcohol or use drugs.  Family / Support Systems Marital Status: Widow/Widower How Long?: since 09/2017 Patient Roles: Parent Children: son, Tianna Baus [facility]) @ [phone];  daughter, Doyle Askew [facility]) @ [phone] Other Supports: services available via [place] Anticipated  Caregiver: hired assist as needed (has used hired CNAs in past, mostly at nighttime; plan to have friend/neighbor who has assisted in the past for intemittant assist) Ability/Limitations of Caregiver: Min A Caregiver Availability: Intermittent Family Dynamics: Pt notes her children are living fairly close by and very supportive.  Social History Preferred language: English Religion: Baptist Cultural Background: NA Read: Yes Write: Yes Employment Status: Retired Public relations account executive Issues: None Guardian/Conservator: None - per MD,  pt is capable of making decision on her own behalf.   Abuse/Neglect Abuse/Neglect Assessment Can Be Completed: Yes Physical Abuse: Denies Verbal Abuse: Denies Sexual Abuse: Denies Exploitation of patient/patient's resources: Denies Self-Neglect: Denies  Emotional Status Pt's affect, behavior and adjustment status: Pt very pleasant and very talkative.  She denies any concerns about her overall discharge arrangements noting that she has "plenty of help if I need it."  Denies any significant emotional distress - will monitor. Recent Psychosocial Issues: None Psychiatric History: None Substance Abuse History: none  Patient / Family Perceptions, Expectations & Goals Pt/Family understanding of illness & functional limitations: Pt and family with good understanding of surgery performed and current functional limitations/ need for CIR. Premorbid pt/family roles/activities: Pt was independent PTA at ILF community Anticipated changes in roles/activities/participation: Per min assist goals, pt will need family, caregivers to provide 24/7 support at least initially. Pt/family expectations/goals: "I just want to be able to do as much for myself as possible."  [employer]: None Premorbid Home Care/DME Agencies: Other (Comment)([facility] has followed in the past) Transportation available at discharge: yes  Discharge Planning Living Arrangements: Alone(in [place] apt) Support Systems: Children Type of Residence: Independent Living Insurance Resources: [employer], Multimedia programmer (specify)(BCBS) Financial Resources: [place] Referred: No Living Expenses: Rent Money Management: Patient Does the patient have any problems obtaining your medications?: No Home Management: Pt Patient/Family Preliminary Plans: Pt to return to ILF apt with family assist as needed along with hired staff Social Work Anticipated Follow Up Needs: HH/OP Expected length of  stay: 18-21 days  Clinical Impression Very pleasant, talkative woman here following back surgery.  Plan to return to [place] apt at [facility] at d/c.  She has the ability, via family and friend, to arrange 24/7 support if needed.  Denies any significant emotional distress.    Daje Stark 05/31/2019, 4:47 PM

## 2019-05-31 NOTE — Progress Notes (Signed)
Physical Therapy Session Note  Patient Details  Name: Tara [name] MRN: [id number] Date of Birth: 22-Mar-1936  Today's Date: 05/31/2019 PT Individual Time: 1420-1500 PT Individual Time Calculation (min): 40 min   Short Term Goals: Week 1:  PT Short Term Goal 1 (Week 1): Pt will perform bed<>WC transfers with mod assist consistently PT Short Term Goal 2 (Week 1): Pt will perform all bed mobility with min assist PT Short Term Goal 3 (Week 1): Pt will ambulate 67ft with LRAD and mod assist PT Short Term Goal 4 (Week 1): Pt will propell WC 128ft with supervision assist  Skilled Therapeutic Interventions/Progress Updates:    Focused on functional bed mobility retraining with log roll technique and use of bedrails with pt able to roll with supervision using rails and supine -> sit with CGA and sit -> supine with min assist for LE management. Pt tangential at start of session and requires redirection back to task on hand. Focused on vestibular exercises including gaze stabilization with horiztonal head turns and vertical nods with single card and then double card x 10 reps each x 3 sets with verbal cues for technique. Pt with increased symptoms of dizziness with single card tasks rating 10/10.Marland Kitchen pt unable to rate with double cards but pt states it was much less symptomatic. Pt states she felt like this was productive and helpful. Encouraged pt to continue these on her own time over the weekend as well. Repositioned in bed at end of session with all needs in reach.   Therapy Documentation Precautions:  Precautions Precautions: Fall, Back Required Braces or Orthoses: Spinal Brace Spinal Brace: Lumbar corset, Applied in sitting position Restrictions Weight Bearing Restrictions: No General: PT Amount of Missed Time (min): 20 Minutes PT Missed Treatment Reason: Other (Comment)(nursing care and pharmacy visit)  Pain:  back pain while sitting unsupported after 15 min - repositioned in supine with  HOB elevated.   Therapy/Group: Individual Therapy  Canary Brim Ivory Broad, PT, DPT, CBIS  05/31/2019, 3:15 PM

## 2019-05-31 NOTE — Progress Notes (Signed)
Physical Therapy Session Note  Patient Details  Name: Tara [name] MRN: [id number] Date of Birth: 08/20/1936  Today's Date: 05/31/2019 PT Individual Time: 0900-1000 PT Individual Time Calculation (min): 60 min   Short Term Goals: Week 1:  PT Short Term Goal 1 (Week 1): Pt will perform bed<>WC transfers with mod assist consistently PT Short Term Goal 2 (Week 1): Pt will perform all bed mobility with min assist PT Short Term Goal 3 (Week 1): Pt will ambulate 33ft with LRAD and mod assist PT Short Term Goal 4 (Week 1): Pt will propell WC 142ft with supervision assist  Skilled Therapeutic Interventions/Progress Updates:    Pt received seated in w/c in at sink working on her makeup. No complaints of pain. Pt agreeable to participate in therapy session but has to be redirected to focus on therapy tasks rather than her makeup. Pt keeps saying "one more thing" then continues to attempt to engage in non-therapy tasks. Pt does request to perform sponge bath and change her dress before heading down to therapy gym. Pt is setup A for changing her bra and her dress while seated in w/c and setup A for bathing underarms and periarea. Pt is dependent to don TED hose and slide on shoes. Manual w/c propulsion x 150 ft CGA for steering with use of BUE, v/c for propulsion technique. Sit to stand x 1 rep in // bars with min A. Pt reports onset of dizziness and nausea in standing, declines any further standing activity. Transported pt back to her room via w/c. Pt left seated in w/c at sink to finish her makeup with quick release belt and chair alarm in place, call button in reach. Education with patient about starting her makeup earlier in the AM and paying attention to when she has therapy scheduled so that therapy time is spent on functional training vs her trying to finish her makeup.   Therapy Documentation Precautions:  Precautions Precautions: Fall, Back Required Braces or Orthoses: Spinal Brace Spinal  Brace: Lumbar corset, Applied in sitting position Restrictions Weight Bearing Restrictions: No    Therapy/Group: Individual Therapy   Excell Seltzer, PT, DPT  05/31/2019, 10:58 AM

## 2019-06-01 ENCOUNTER — Inpatient Hospital Stay (HOSPITAL_COMMUNITY): Payer: Medicare Other | Admitting: Occupational Therapy

## 2019-06-01 ENCOUNTER — Inpatient Hospital Stay (HOSPITAL_COMMUNITY): Payer: Medicare Other | Admitting: Physical Therapy

## 2019-06-01 DIAGNOSIS — I10 Essential (primary) hypertension: Secondary | ICD-10-CM

## 2019-06-01 DIAGNOSIS — G8918 Other acute postprocedural pain: Secondary | ICD-10-CM

## 2019-06-01 DIAGNOSIS — D62 Acute posthemorrhagic anemia: Secondary | ICD-10-CM

## 2019-06-01 NOTE — Progress Notes (Signed)
Occupational Therapy Session Note  Patient Details  Name: TSION INGHRAM MRN: [id number] Date of Birth: Oct 11, 1936  Today's Date: 06/01/2019 OT Individual Time: 6578-4696 OT Individual Time Calculation (min): 75 min     Skilled Therapeutic Interventions/Progress Updates: focus this session:   Transfers, dynamic standing balance and self care incorporating back precautions.    Overall patient able to complete squat pivot transfers with Min A and toileted and incorporating back precautions.   She did require one reminder to not twist while self cleansing after toileting.      She was left seated in her w/c with her cosmetic bag putting on eyemakeup at end of sesson     Therapy Documentation Precautions:  Precautions Precautions: Fall, Back Required Braces or Orthoses: Spinal Brace Spinal Brace: Lumbar corset, Applied in sitting position Restrictions Weight Bearing Restrictions: No  Pain:denied     Therapy/Group: Individual Therapy  Alfredia Ferguson [facility] 06/01/2019, 4:44 PM
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
## 2019-06-01 NOTE — Progress Notes (Signed)
Physical Therapy Session Note  Patient Details  Name: Tara [name] MRN: [id number] Date of Birth: 06-Nov-1936  Today's Date: 06/01/2019 PT Individual Time: 1652-1740 PT Individual Time Calculation (min): 48 min   Short Term Goals: Week 1:  PT Short Term Goal 1 (Week 1): Pt will perform bed<>WC transfers with mod assist consistently PT Short Term Goal 2 (Week 1): Pt will perform all bed mobility with min assist PT Short Term Goal 3 (Week 1): Pt will ambulate 58ft with LRAD and mod assist PT Short Term Goal 4 (Week 1): Pt will propell WC 129ft with supervision assist  Skilled Therapeutic Interventions/Progress Updates:    Pt received asleep, supine in bed and easily awakens to name. Pt agreeable to therapy session. Pt able to recall 3/3 spine precautions with increased time but no cuing. Supine>sit, HOB slightly elevated and using bedrails, demonstrating logroll technique with supervision. Donned brace sitting EOB with max assist. Pt reports need to use bathroom. Sit>stand elevated EOB>stedy with min assist for lifting. Dependent stedy transfer EOB>BSC. Sit<>stands in stedy with min assist/CGA for lifting/balance. Stand>sit stedy>BSC with min/mod assist for eccentric control. Pt continent of bladder. While sitting on toilet pt reports onset of nausea and requests to have a "Gin Gin" candy. Performed seated peri-care with set-up assist and supervision for safety. Sit>stand BSC>stedy with min assist for lifting. Dependent stedy transfer BSC>w/c. Transported to/from gym in w/c. Squat pivot transfer w/c>Nustep with min assist for pivoting hips. Performed B LE reciprocal movements on Nustep focusing on B LE strengthening and activity tolerance against level 4 resistance for 9 minutes totaling 340 steps with goal of maintaining steps per minute >40. Squat pivot transfer Nustep>w/c with min assist for pivoting hips. Transported back to room in w/c. Reviewed vertical gaze stabilization exercise with mod/max  cuing for proper sequencing of eye movement vs head movement. Pt reports that her R contact feels loose in her eye - therapist unable to see it in pt's eye therefore notified RN. Pt left sitting in w/c with needs in reach and seat belt alarm on.  Therapy Documentation Precautions:  Precautions Precautions: Fall, Back Required Braces or Orthoses: Spinal Brace Spinal Brace: Lumbar corset, Applied in sitting position Restrictions Weight Bearing Restrictions: No  Pain:   Reports having bilateral knee pain rated as 7/10; however, reports after performing Nustep the knee pain has decreased and defers ice or heat intervention.    Therapy/Group: Individual Therapy  Tawana Scale, PT, DPT  06/01/2019, 3:32 PM

## 2019-06-01 NOTE — Progress Notes (Signed)
Physical Therapy Session Note  Patient Details  Name: Tara [name] MRN: [id number] Date of Birth: 03/15/1936  Today's Date: 06/01/2019 PT Individual Time: 1000-1100 PT Individual Time Calculation (min): 60 min   Short Term Goals: Week 1:  PT Short Term Goal 1 (Week 1): Pt will perform bed<>WC transfers with mod assist consistently PT Short Term Goal 2 (Week 1): Pt will perform all bed mobility with min assist PT Short Term Goal 3 (Week 1): Pt will ambulate 49ft with LRAD and mod assist PT Short Term Goal 4 (Week 1): Pt will propell WC 119ft with supervision assist  Skilled Therapeutic Interventions/Progress Updates:    Pt received seated in w/c in room finishing her makeup. With max encouragement pt finishes in a timely manner and then is able to engage in therapy tasks. Manual w/c propulsion x 150 ft with use of BUE and Supervision. Pt requests to wash some of her clothes, assisted pt with starting laundry. Sit to stand in // bars with min A x 5 reps. Pt attempts to take a few steps forwards but exhibits poor BLE control and unable to maintain standing, requires max to total A to safely sit back down in w/c. Sit to stand x 5 reps from w/c to stedy with min A. Pt demos improved balance with use of stedy, able to tolerate standing ~15 sec before onset of dizziness, symptoms subside after seated rest break. Pt encouraged to continue with vestibular exercises during her downtime. Pt left seated in w/c in room with needs in reach, quick release belt and chair alarm in place at end of session.  Therapy Documentation Precautions:  Precautions Precautions: Fall, Back Required Braces or Orthoses: Spinal Brace Spinal Brace: Lumbar corset, Applied in sitting position Restrictions Weight Bearing Restrictions: No    Therapy/Group: Individual Therapy   Excell Seltzer, PT, DPT  06/01/2019, 3:50 PM

## 2019-06-01 NOTE — Plan of Care (Signed)
  Problem: Consults Goal: RH SPINAL CORD INJURY PATIENT EDUCATION Description:  See Patient Education module for education specifics.  Outcome: Progressing   Problem: SCI BOWEL ELIMINATION Goal: RH STG MANAGE BOWEL WITH ASSISTANCE Description: STG Manage Bowel with min Assistance. Outcome: Progressing   Problem: SCI BLADDER ELIMINATION Goal: RH STG MANAGE BLADDER WITH ASSISTANCE Description: STG Manage Bladder With Min Assistance Outcome: Progressing   Problem: RH SKIN INTEGRITY Goal: RH STG SKIN FREE OF INFECTION/BREAKDOWN Description: No new breakdown with mod I assist Outcome: Progressing Goal: RH STG ABLE TO PERFORM INCISION/WOUND CARE W/ASSISTANCE Description: STG Able To Perform Incision/Wound Care With [employer]. Outcome: Progressing   Problem: RH SAFETY Goal: RH STG ADHERE TO SAFETY PRECAUTIONS W/ASSISTANCE/DEVICE Description: STG Adhere to Safety Precautions With Mod I Assistance/Device. Outcome: Progressing   Problem: RH PAIN MANAGEMENT Goal: RH STG PAIN MANAGED AT OR BELOW PT'S PAIN GOAL Description: < 3 out of 10.  Outcome: Progressing

## 2019-06-02 ENCOUNTER — Inpatient Hospital Stay (HOSPITAL_COMMUNITY): Payer: Medicare Other | Admitting: Physical Therapy

## 2019-06-02 DIAGNOSIS — F411 Generalized anxiety disorder: Secondary | ICD-10-CM

## 2019-06-02 NOTE — Progress Notes (Signed)
Up to [facility]. Continent voids. Pain managed with PRN ultram, given at 2118 and 0500. C/O BLE pain. Honeycomb dressing to back-CD&I. Tara [name] A

## 2019-06-02 NOTE — Progress Notes (Signed)
Physical Therapy Session Note  Patient Details  Name: Tara [name] MRN: [id number] Date of Birth: 1936/08/23  Today's Date: 06/02/2019 PT Individual Time: 0830-0900 PT Individual Time Calculation (min): 30 min   Short Term Goals: Week 1:  PT Short Term Goal 1 (Week 1): Pt will perform bed<>WC transfers with mod assist consistently PT Short Term Goal 2 (Week 1): Pt will perform all bed mobility with min assist PT Short Term Goal 3 (Week 1): Pt will ambulate 84ft with LRAD and mod assist PT Short Term Goal 4 (Week 1): Pt will propell WC 128ft with supervision assist  Skilled Therapeutic Interventions/Progress Updates:   Pt rec'd in w/c, states she feels very 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  Pt performs standing in parallel bars 2 x 2 minutes with wt shifts and mini squats with mod facilitation for upright posture and knee control.  Pt then with onset of nausea and unable to perform more standing. Pt requests to toilet. Pt performs toilet transfers with stedy and mod A.  Pt left in bed with all needs at hand, alarm set.   Therapy Documentation Precautions:  Precautions Precautions: Fall, Back Required Braces or Orthoses: Spinal Brace Spinal Brace: Lumbar corset, Applied in sitting position Restrictions Weight Bearing Restrictions: No Pain: Pt with no c/o pain   Therapy/Group: Individual Therapy  Lavance Beazer 06/02/2019, 9:50 AM

## 2019-06-02 NOTE — Plan of Care (Signed)
  Problem: Consults Goal: RH SPINAL CORD INJURY PATIENT EDUCATION Description:  See Patient Education module for education specifics.  Outcome: Progressing   Problem: SCI BOWEL ELIMINATION Goal: RH STG MANAGE BOWEL WITH ASSISTANCE Description: STG Manage Bowel with min Assistance. Outcome: Progressing   Problem: SCI BLADDER ELIMINATION Goal: RH STG MANAGE BLADDER WITH ASSISTANCE Description: STG Manage Bladder With Min Assistance Outcome: Progressing   Problem: RH SKIN INTEGRITY Goal: RH STG SKIN FREE OF INFECTION/BREAKDOWN Description: No new breakdown with mod I assist Outcome: Progressing Goal: RH STG ABLE TO PERFORM INCISION/WOUND CARE W/ASSISTANCE Description: STG Able To Perform Incision/Wound Care With [employer]. Outcome: Progressing   Problem: RH SAFETY Goal: RH STG ADHERE TO SAFETY PRECAUTIONS W/ASSISTANCE/DEVICE Description: STG Adhere to Safety Precautions With Mod I Assistance/Device. Outcome: Progressing   Problem: RH PAIN MANAGEMENT Goal: RH STG PAIN MANAGED AT OR BELOW PT'S PAIN GOAL Description: < 3 out of 10.  Outcome: Progressing
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
## 2019-06-03 ENCOUNTER — Inpatient Hospital Stay (HOSPITAL_COMMUNITY): Payer: Medicare Other | Admitting: Occupational Therapy

## 2019-06-03 ENCOUNTER — Inpatient Hospital Stay (HOSPITAL_COMMUNITY): Payer: Medicare Other | Admitting: Physical Therapy

## 2019-06-03 DIAGNOSIS — R42 Dizziness and giddiness: Secondary | ICD-10-CM

## 2019-06-03 NOTE — Progress Notes (Signed)
Occupational Therapy Session Note  Patient Details  Name: Tara [name] MRN: [id number] Date of Birth: [birth year]  Today's Date: 06/03/2019 OT Individual Time: 1000-1030 and 1300-1400 OT Individual Time Calculation (min): 30 min and 60 min and Today's Date: 06/03/2019 OT Missed Time: 30 Minutes Missed Time Reason: Patient fatigue   Short Term Goals: Week 1:  OT Short Term Goal 1 (Week 1): Pt will consistently complete functional transfers with mod A using LRAD OT Short Term Goal 2 (Week 1): Pt will complete LB dressing with min A using AE PRN OT Short Term Goal 3 (Week 1): Pt will recall 3/3 back pre-cautions independently. OT Short Term Goal 4 (Week 1): Pt will bathe with min A using AE PRN  Skilled Therapeutic Interventions/Progress Updates:    Session One: Pt seen for OT session focusing on functional transfers and mobility techniques. Pt asleep in w/c upon arrival, initially easy to awaken, however, would close eyes while therapist was speaking and difficulty staying arroused. She was willing to attempt therapy as able.  She self propelled w/c throughout unit for UE strengthening with VCs for effective w/c propulsion techniques. Completed sliding board transfer w/c> EOM, min A overall with VCs for technique and safety awareness. Throughout session, pt with complaints of nausea and vertigo like symptoms. VCs for gaze stabilization and increased time btwn movements required. Completed x5 sit> partial stand with therapist sitting in standard arm chair in front of her, pt using armrests of chair to support in power into standing. Pt unable to achieve full erect stand or tolerate position >~5 seconds before returning to sitting.  Pt then voiced dizziness/ nausea to bad to cont with therapy and requesting to return to room. Min A sliding board transfers EOM>w/c and w/c> EOB with multi-modal cuing for technique.  Pt positioned in supine at end of session, all needs in reach and bed alarm on .  RN made aware of pt's complaints.   Session Two: Pt seen for OT session focusing on functional transfers and ADL re-training. Pt in supine upon arrival, voicing feeling slightly better from AM session and willing to participate as able. She transferred to sitting EOB using hospital bed functions wit supervision. Min A squat pivot transfer to [facility]. She was able to manage gown up and continent B/B episode, lateral leans to complete buttock  Hygiene. Min A squat pivot to return to EOB and then EOB> w/c.  Hand hygiene completed from w/c level mod I. Pt requesting to do NuStep for LE strengthening. Completed 9 minutes on level 5 using LEs only. Modifications provided in order to maintain foot placement on pedals due to LE proprioception and strength deficits. Pt returned to room and requesting to complete bathing task. Pt set-up with all needed items and therapist assisted with washing B feet. Pt left set-up at sink to finish task, RN made aware of pt's position and call bell within reach of pt.   Therapy Documentation Precautions:  Precautions Precautions: Fall, Back Required Braces or Orthoses: Spinal Brace Spinal Brace: Lumbar corset, Applied in sitting position Restrictions Weight Bearing Restrictions: No   Therapy/Group: Individual Therapy  Arlene Brickel L 06/03/2019, 7:02 AM

## 2019-06-03 NOTE — Care Management (Signed)
[place] Individual Statement of Services  Patient Name:  Tara [name]  Date:  06/03/2019  Welcome to the [facility].  Our goal is to provide you with an individualized program based on your diagnosis and situation, designed to meet your specific needs.  With this comprehensive rehabilitation program, you will be expected to participate in at least 3 hours of rehabilitation therapies Monday-Friday, with modified therapy programming on the weekends.  Your rehabilitation program will include the following services:  Physical Therapy (PT), Occupational Therapy (OT), 24 hour per day rehabilitation nursing, Therapeutic Recreaction (TR), Case Management (Social Worker), Rehabilitation Medicine, Nutrition Services and Pharmacy Services  Weekly team conferences will be held on Tuesdays to discuss your progress.  Your Social Worker will talk with you frequently to get your input and to update you on team discussions.  Team conferences with you and your family in attendance may also be held.  Expected length of stay: 18-21 days   Overall anticipated outcome: minimal assistance  Depending on your progress and recovery, your program may change. Your Social Worker will coordinate services and will keep you informed of any changes. Your Social Worker's name and contact numbers are listed  below.  The following services may also be recommended but are not provided by the [facility] will be made to provide these services after discharge if needed.  Arrangements include referral to agencies that provide these services.  Your insurance has been verified to be:  Medicare; BCBS Your primary doctor is:  [employer]  Pertinent information will be shared with your doctor and your insurance company.  Social Worker:  [place], [place] or ([phone]   Information discussed with and copy given to patient by: Tara [name], 06/03/2019, 11:22 AM

## 2019-06-03 NOTE — Progress Notes (Signed)
Didn't sleep good. PRN valium given at [place], tylenol and sorbitol at 2015. C/O feeling constipated. Prune juice given as well. At 0200, in "severe" pain per patient. Reluctant to take Oxy Ir, PRN ultram given. Slept until 0450, requesting Oxy IR and given. Pain mostly in RLE-"achy and throbbing". No BM on this shift. Tara [name] A

## 2019-06-03 NOTE — Progress Notes (Signed)
Physical Therapy Session Note  Patient Details  Name: Tara [name] MRN: [id number] Date of Birth: [birth year]  Today's Date: 06/03/2019 PT Individual Time: 0800-0915 PT Individual Time Calculation (min): 75 min   Short Term Goals: Week 1:  PT Short Term Goal 1 (Week 1): Pt will perform bed<>WC transfers with mod assist consistently PT Short Term Goal 2 (Week 1): Pt will perform all bed mobility with min assist PT Short Term Goal 3 (Week 1): Pt will ambulate 15ft with LRAD and mod assist PT Short Term Goal 4 (Week 1): Pt will propell WC 184ft with supervision assist  Skilled Therapeutic Interventions/Progress Updates:    Pt received seated in w/c in room, agreeable to PT session. Pt requesting pain medication for 6/10 pain in RLE, reports it was 10/10 overnight but pain medication and heat helped to decrease pain. RN able to provide pain medication at beginning of therapy session. Squat pivot transfer w/c to/from mat table with min A. Sit to/from supine with min A on therapy wedge on mat table. Semi-reclined BLE strengthening therex: heel slides, hip abd, quad sets, SAQ, SKFO x 10 reps each with AAROM for RLE. Pt appears to have RLE weakness > LLE weakness but sensory deficits LLE>RLE. Sit to stand in // bars with min A. Ambulation x 5 ft forwards in // bars with mod A, R knee blocked to prevent buckling. Pt exhibits decreased weight shift with gait but improved ability to take steps this date. Pt able to take a few steps backwards towards b/c with mod to max A but unable to come all the way back to w/c and requires total A x 2 to sit safely back in w/c.Pt left seated in w/c in room with needs in reach, quick release belt and chair alarm in place at end of session.  Therapy Documentation Precautions:  Precautions Precautions: Fall, Back Required Braces or Orthoses: Spinal Brace Spinal Brace: Lumbar corset, Applied in sitting position Restrictions Weight Bearing Restrictions:  No    Therapy/Group: Individual Therapy   Excell Seltzer, PT, DPT  06/03/2019, 12:04 PM

## 2019-06-03 NOTE — Plan of Care (Signed)
  Problem: Consults Goal: RH SPINAL CORD INJURY PATIENT EDUCATION Description:  See Patient Education module for education specifics.  Outcome: Progressing   Problem: SCI BOWEL ELIMINATION Goal: RH STG MANAGE BOWEL WITH ASSISTANCE Description: STG Manage Bowel with min Assistance. Outcome: Progressing   Problem: SCI BLADDER ELIMINATION Goal: RH STG MANAGE BLADDER WITH ASSISTANCE Description: STG Manage Bladder With Min Assistance Outcome: Progressing   Problem: RH SKIN INTEGRITY Goal: RH STG SKIN FREE OF INFECTION/BREAKDOWN Description: No new breakdown with mod I assist Outcome: Progressing Goal: RH STG ABLE TO PERFORM INCISION/WOUND CARE W/ASSISTANCE Description: STG Able To Perform Incision/Wound Care With [employer]. Outcome: Progressing   Problem: RH SAFETY Goal: RH STG ADHERE TO SAFETY PRECAUTIONS W/ASSISTANCE/DEVICE Description: STG Adhere to Safety Precautions With Mod I Assistance/Device. Outcome: Progressing   Problem: RH PAIN MANAGEMENT Goal: RH STG PAIN MANAGED AT OR BELOW PT'S PAIN GOAL Description: < 3 out of 10.  Outcome: Progressing

## 2019-06-03 NOTE — Progress Notes (Signed)
[facility] for xarelto Indication: VTE treatment  Allergies  Allergen Reactions  . Ceclor [Cefaclor] Hives and Itching    Patient Measurements: Height: 5\' 3"  (160 cm) Weight: 161 lb 13.4 oz (73.4 kg) IBW/kg (Calculated) : 52.4  Vital Signs: Temp: 98.1 F (36.7 C) (07/13 0451) Temp Source: Oral (07/13 0451) BP: 162/71 (07/13 0451) Pulse Rate: 79 (07/13 0451)  Labs: No results for input(s): HGB, HCT, PLT, APTT, LABPROT, INR, HEPARINUNFRC, HEPRLOWMOCWT, CREATININE, CKTOTAL, CKMB, TROPONINIHS in the last 72 hours.  Estimated Creatinine Clearance: 51.9 mL/min (by C-G formula based on SCr of 0.83 mg/dL).   Medical History: Past Medical History:  Diagnosis Date  . Arthritis    knees  . Breast cancer ([place]) 09/30/15   right breast  . Dependent edema   . Depression    just lost spouse in Nov. 2018  . Essential hypertension, benign 04/15/2011  . Herniated lumbar disc without myelopathy   . Hx of staphylococcal infection   . Hypertension   . Hypothyroidism   . Obesity   . Pre-diabetes   . Radicular pain   . Recurrent UTI   . Stroke [facility])    ' mini"  . Thyroid disease    hypothyroidism  . Vertigo   . Wears contact lenses    one   Assessment: 83 yo patient with Bilateral lower extremity Dopplers 05/28/2019 show DVT right popliteal vein, right posterior tibial vein and right gastrocnemius vein as well as left lower extremity DVT left peroneal vein and left gastroc vein. Patient cleared by N surg to start xarelto per Rehab PA note.   Renal function stable, H/H low but stable and no reports of bleeding at this time.    Goal of Therapy:  Monitor platelets by anticoagulation protocol: Yes   Plan:  Continue Xarelto 15mg  PO BID (total 21 days) until 7/28 and start 20mg  PO daily Continue to monitor renal function, s/s bleeding  Berenice Bouton, PharmD PGY1 Pharmacy Resident Office phone: [phone]  Please check AMION for all [place] numbers 06/03/2019 9:08 AM
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
## 2019-06-04 ENCOUNTER — Inpatient Hospital Stay (HOSPITAL_COMMUNITY): Payer: Medicare Other | Admitting: Occupational Therapy

## 2019-06-04 ENCOUNTER — Inpatient Hospital Stay (HOSPITAL_COMMUNITY): Payer: Medicare Other | Admitting: Physical Therapy

## 2019-06-04 NOTE — Plan of Care (Signed)
  Problem: Consults Goal: RH SPINAL CORD INJURY PATIENT EDUCATION Description:  See Patient Education module for education specifics.  Outcome: Progressing   Problem: SCI BOWEL ELIMINATION Goal: RH STG MANAGE BOWEL WITH ASSISTANCE Description: STG Manage Bowel with min Assistance. Outcome: Progressing   Problem: SCI BLADDER ELIMINATION Goal: RH STG MANAGE BLADDER WITH ASSISTANCE Description: STG Manage Bladder With Min Assistance Outcome: Progressing   Problem: RH SKIN INTEGRITY Goal: RH STG SKIN FREE OF INFECTION/BREAKDOWN Description: No new breakdown with mod I assist Outcome: Progressing Goal: RH STG ABLE TO PERFORM INCISION/WOUND CARE W/ASSISTANCE Description: STG Able To Perform Incision/Wound Care With [employer]. Outcome: Progressing   Problem: RH SAFETY Goal: RH STG ADHERE TO SAFETY PRECAUTIONS W/ASSISTANCE/DEVICE Description: STG Adhere to Safety Precautions With Mod I Assistance/Device. Outcome: Progressing   Problem: RH PAIN MANAGEMENT Goal: RH STG PAIN MANAGED AT OR BELOW PT'S PAIN GOAL Description: < 3 out of 10.  Outcome: Progressing
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
## 2019-06-04 NOTE — Progress Notes (Signed)
Spoke at length with daughter this morning Ms. Christoper Fabian in regards to mother and patient's medical regimen.  Daughter prefers at this time to discontinue oxycodone and continue only tramadol for now due to patient's bouts of confusion and lethargy.

## 2019-06-04 NOTE — Progress Notes (Signed)
Physical Therapy Session Note  Patient Details  Name: Tara [name] MRN: [id number] Date of Birth: [birth year]  Today's Date: 06/04/2019 PT Individual Time: 5366-4403 PT Individual Time Calculation (min): 15 min   Short Term Goals: Week 1:  PT Short Term Goal 1 (Week 1): Pt will perform bed<>WC transfers with mod assist consistently PT Short Term Goal 2 (Week 1): Pt will perform all bed mobility with min assist PT Short Term Goal 3 (Week 1): Pt will ambulate 42ft with LRAD and mod assist PT Short Term Goal 4 (Week 1): Pt will propell WC 128ft with supervision assist  Skilled Therapeutic Interventions/Progress Updates:    Pt received seated in bed, agreeable to participate in therapy make up time. Pt reports feeling fatigued but agreeable to bed level activity. Reviewed supine BLE strengthening HEP, per pt she has handout of therex from her previous knee surgeries and will have daughter bring in her exercises for therapist to review and add to as pt has been consistently performing this exercise program at home. Also reviewed handout of pt's vestibular exercises. Assisted pt with calling in her dinner and breakfast order with setup A to use phone and navigate menu. Pt left seated in bed with needs in reach, bed alarm in place at end of session.  Therapy Documentation Precautions:  Precautions Precautions: Fall, Back Required Braces or Orthoses: Spinal Brace Spinal Brace: Lumbar corset, Applied in sitting position Restrictions Weight Bearing Restrictions: No    Therapy/Group: Individual Therapy   Excell Seltzer, PT, DPT  06/04/2019, 3:41 PM

## 2019-06-04 NOTE — Progress Notes (Signed)
Occupational Therapy Session Note  Patient Details  Name: Tara [name] MRN: [id number] Date of Birth: 30-Aug-1936  Today's Date: 06/04/2019 OT Individual Time: 1300-1400 and 1430-1500 OT Individual Time Calculation (min): 60 min and 30 min   Short Term Goals: Week 1:  OT Short Term Goal 1 (Week 1): Pt will consistently complete functional transfers with mod A using LRAD OT Short Term Goal 2 (Week 1): Pt will complete LB dressing with min A using AE PRN OT Short Term Goal 3 (Week 1): Pt will recall 3/3 back pre-cautions independently. OT Short Term Goal 4 (Week 1): Pt will bathe with min A using AE PRN  Skilled Therapeutic Interventions/Progress Updates:    Session One: Pt seen for OT session focusing on functional mobility, upright tolreance and sit>stand. Pt in supine upon arrival, agreeable to tx session. Reports dizziness/ nausea feeling improved slightly since PT session earlier in day, willing to attempt therapy as able.  BP assessed in supine and sitting  Supine: 128/60  Sitting upright: 123/63 Pt transferred to sitting EOB with VCs using hospital bed functions. Min-mod A squat pivot transfers completed throughout session from EOB<> drop arm BSC and EOB>w/c. Pt with continent void on BSC, completed hygiene via lateral leans with supervision.  She returned to EOB and then transitioned to w/c.  Discussed home bathroom set-up and DME. Pt's daughter to send her pictures of home bathroom walk-in shower. Completed x5 sit>stand at sink. Min A to power into standing with blocking of R knee. Pt initially only tolerating ~3 seconds in standing before having to sit 2/2 dizziness with increased time btwn standing trials. However, pt with increased tolerance with each trial, tolerating ~15 seconds on final trial. Pt requested to complete toileting task again at end of session, completed squat pivot transfer to [facility]. Pt left seated on BSC with call bell in reach when finished, RN made aware of pt's  position.    Session Two: Pt seen for OT session focusing on education and bathroom transfers. Pt sitting up in w/c, denied pain and agreeable to tx session. Her daughter had sent pictures of walk in shower. Does not look to be easily/safely accessible from w/c level. Pt reports she also has tub/shower combination. Pt taken to ADL apartment and education/demonstration provided regarding tub transfer bench and transfer technique. Pt believed set-up would work for home bathroom. Completed simulated tub/shower transfer utilizing tub bench. Mod A overall with VCs for technique and adherence to spinal pre-cautions during transfer. With increased practice would be safe option for d/c. Pt pleased with this option. She returned to room at end of session, transitioned back to supine and left with all needs in reach and bed alarm set.   Therapy Documentation Precautions:  Precautions Precautions: Fall, Back Required Braces or Orthoses: Spinal Brace Spinal Brace: Lumbar corset, Applied in sitting position Restrictions Weight Bearing Restrictions: No   Therapy/Group: Individual Therapy  [name] L 06/04/2019, 7:13 AM

## 2019-06-04 NOTE — Progress Notes (Signed)
Physical Therapy Session Note  Patient Details  Name: Tara [name] MRN: [id number] Date of Birth: 1936-03-21  Today's Date: 06/04/2019 PT Individual Time: 1030-1200 PT Individual Time Calculation (min): 90 min  Missed Time: 15 min Missed Time Reason: nausea  Short Term Goals: Week 1:  PT Short Term Goal 1 (Week 1): Pt will perform bed<>WC transfers with mod assist consistently PT Short Term Goal 2 (Week 1): Pt will perform all bed mobility with min assist PT Short Term Goal 3 (Week 1): Pt will ambulate 64ft with LRAD and mod assist PT Short Term Goal 4 (Week 1): Pt will propell WC 170ft with supervision assist  Skilled Therapeutic Interventions/Progress Updates:    Session 1: Pt received seated in w/c at sink, reports feeling nauseous and asks therapist to return in a few min after she has eaten some crackers. Pt missed 15 min at beginning of therapy session 2/2 nausea. When therapist returns pt is agreeable to PT session. Dependent to don TED hose and shoes while seated in w/c. Squat pivot transfer w/c to/from bed with min A with v/c for safe hand placement during transfer. Set pt up with more narrow 18x18 w/c for improved fit. Manual w/c propulsion x 150 ft with use of BUE and Supervision. W/c pushup to lift buttocks in order to dependently don pants while seated in w/c. Sit to stand x 5 reps to LiteGait with min to mod A in order to don LiteGait sling. Ambulation x 5 ft in LiteGait with assist for lateral weight shift and to advance LLE. Pt has onset of nausea and dizziness with ambulation in LiteGait, returned to sitting. Pt reports urge to use the bathroom. Toilet transfer via stedy. Pt reports ongoing dizziness while seated in w/c after toilet transfer. Seated BP 103/55. Squat pivot transfer back to bed min A. Sit to supine mod A for BLE management. Supine BP 111/52. Pt left semi-reclined in bed with needs in reach, bed alarm in place. Notified RN of pt symptoms and BP.  Therapy  Documentation Precautions:  Precautions Precautions: Fall, Back Required Braces or Orthoses: Spinal Brace Spinal Brace: Lumbar corset, Applied in sitting position Restrictions Weight Bearing Restrictions: No    Therapy/Group: Individual Therapy   Excell Seltzer, PT, DPT  06/04/2019, 12:11 PM

## 2019-06-05 ENCOUNTER — Inpatient Hospital Stay (HOSPITAL_COMMUNITY): Payer: Medicare Other | Admitting: Occupational Therapy

## 2019-06-05 ENCOUNTER — Inpatient Hospital Stay (HOSPITAL_COMMUNITY): Payer: Medicare Other | Admitting: Physical Therapy

## 2019-06-05 ENCOUNTER — Encounter (HOSPITAL_COMMUNITY): Payer: Medicare Other | Admitting: Psychology

## 2019-06-05 DIAGNOSIS — G894 Chronic pain syndrome: Secondary | ICD-10-CM

## 2019-06-05 MED ORDER — GABAPENTIN 100 MG PO CAPS
100.0000 mg | ORAL_CAPSULE | Freq: Two times a day (BID) | ORAL | Status: DC
  Administered 2019-06-05 – 2019-06-19 (×29): 100 mg via ORAL
  Filled 2019-06-05 (×29): qty 1

## 2019-06-05 NOTE — Consult Note (Signed)
Neuropsychological Consultation   Patient:   Tara [name]   DOB:   08-Sep-1936  MR Number:  [id number]  Location:  [facility] [place] [id number] [place] [address] Dept: [place]: [phone]           Date of Service:   06/05/2019  Start Time:   9 AM End Time:   10 AM  Provider/Observer:  Ilean Skill, Psy.D.       Clinical Neuropsychologist       Billing Code/Service: 24401  Chief Complaint:    Tara [name] is an 83 year old female with history of hypertension, hypothyroidism, left TKA, TIA, lumbar laminectomy with decompression 2014.  Presented 05/23/2019 with low back pain radiating to the left lower extremity.  Work-up revealed critical stenosis L3-4 with disc herniation.  Underwent redo decompressive surgery.  Patient has been dealing with considerable pain prior to this with shoulder, knee and other pain and back issues.  Patient continuing to report pain but reports managing with pain and doing therapies.  Patient with some anxiety and bereavement issues with recent death of husband of 2 years.     Reason for Service:  Patient referred for neuropsychological consultation due to coping and adjustment issues.  Below is the HPI for the current admission.  HPI: Tara [name] is an 83 year old right-handed female with history of hypertension, hypothyroidism,left TKA 2019, TIA maintained on aspirin, lumbar laminectomy with decompression 2014.  History taken from chart review and patient.  Chart review, patient was alone in an independent living facility[facility].  Patient used a walker and quad cane prior to admission.  Presented 05/23/2019 with low back pain radiating to the left lower extremity.  Work-up and imaging revealed critical stenosis L3-4  With disc herniation and possible recurrence of synovial cyst with radiculopathy.  Underwent redo decompressive lumbar laminectomy  L3-4 with complete medial facetectomies and radical foraminotomies of the L4 and L3 nerve roots posterior lumbar interbody fusion 05/23/2019 per Dr. Saintclair Halsted.  Hospital course complicated by pain.  Routine back precautions with back brace when out of bed.  Subcutaneous heparin for DVT prophylaxis.  Therapy evaluation completed and patient was admitted for a comprehensive rehab program.  Current Status:  The patient describes times of issues coping with death of husband but that it is not interfering with therapy efforts and is improving overall.  Patient reports that while she is in pain, she is coping with current pain interventions.  Concerned about how "overprotective her kids are."  Reports anxiety but not overwhelming and she feels like she is improving and looking forward to getting back to [facility].    Behavioral Observation: [place]  presents as a 83 y.o.-year-old Right Caucasian Female who appeared her stated age. her dress was Appropriate and she was Well Groomed and her manners were Appropriate to the situation.  her participation was indicative of Appropriate and Attentive behaviors.  There were any physical disabilities noted.  she displayed an appropriate level of cooperation and motivation.     Interactions:    Active Appropriate and Attentive  Attention:   within normal limits and attention span and concentration were age appropriate  Memory:   within normal limits; recent and remote memory intact  Visuo-spatial:  not examined  Speech (Volume):  normal  Speech:   normal; Some mild age appropriate word and name finding issues.  Thought Process:  Coherent and Relevant  Though  Content:  WNL; not suicidal and not homicidal  Orientation:   person, place, time/date and situation  Judgment:   Good  Planning:   Good  Affect:    Tearful  Mood:    Dysphoric  Insight:   Good  Intelligence:   normal  Medical History:   Past Medical History:  Diagnosis Date  .  Arthritis    knees  . Breast cancer ([place]) 09/30/15   right breast  . Dependent edema   . Depression    just lost spouse in Nov. 2018  . Essential hypertension, benign 04/15/2011  . Herniated lumbar disc without myelopathy   . Hx of staphylococcal infection   . Hypertension   . Hypothyroidism   . Obesity   . Pre-diabetes   . Radicular pain   . Recurrent UTI   . Stroke [facility])    ' mini"  . Thyroid disease    hypothyroidism  . Vertigo   . Wears contact lenses    one           Abuse/Trauma History: Patient's husband recently died.    Psychiatric History:  Patient withpast history of depression and bereavement issues.    Family Med/Psych History:  Family History  Problem Relation Age of Onset  . Breast cancer Sister   . Stroke Sister   . Cancer Brother        Kidney  . Heart failure Father   . Uterine cancer Sister   . Breast cancer Sister        Uterine  . Cancer Brother        Lung   Impression/DX:  Tara [name] is an 83 year old female with history of hypertension, hypothyroidism, left TKA, TIA, lumbar laminectomy with decompression 2014.  Presented 05/23/2019 with low back pain radiating to the left lower extremity.  Work-up revealed critical stenosis L3-4 with disc herniation.  Underwent redo decompressive surgery.  Patient has been dealing with considerable pain prior to this with shoulder, knee and other pain and back issues.  Patient continuing to report pain but reports managing with pain and doing therapies.  Patient with some anxiety and bereavement issues with recent death of husband of 17 years.     The patient describes times of issues coping with death of husband but that it is not interfering with therapy efforts and is improving overall.  Patient reports that while she is in pain, she is coping with current pain interventions.  Concerned about how "overprotective her kids are."  Reports anxiety but not overwhelming and she feels like she is improving and  looking forward to getting back to [facility].  Disposition/Plan:  Worked on issues of coping with current stressors and loss of husband, and chronic/acute pain issues.         Electronically Signed   _______________________ Ilean Skill, Psy.D.

## 2019-06-05 NOTE — Progress Notes (Signed)
Physical Therapy Weekly Progress Note  Patient Details  Name: Tara [name] MRN: [id number] Date of Birth: Apr 15, 1936  Beginning of progress report period: May 29, 2019 End of progress report period: June 05, 2019  Today's Date: 06/05/2019 PT Individual Time: 0800-0900; 1415-1540 PT Individual Time Calculation (min): 60 min and 85 min  Patient has met 3 of 4 short term goals.  Pt has made slow but steady progress over the past week. She has progressed to performing bed mobility with min A, squat pivot transfers with min A, and w/c mobility with Supervision. Pt is limited by dizziness and symptoms of vertigo in standing limiting her standing tolerance and ability to participate in functional gait training. Pt has been able to ambulate up to 5' in the // bars and with the LiteGait system but fatigues quickly in standing and exhibits decreased LE strength, endurance, and coordination. Pt remains motivated to reach therapy goals.  Patient continues to demonstrate the following deficits muscle weakness, abnormal tone, unbalanced muscle activation and decreased coordination and decreased standing balance, decreased postural control and decreased balance strategies and therefore will continue to benefit from skilled PT intervention to increase functional independence with mobility.  Patient not progressing toward long term goals.  See goal revision..  Plan of care revisions: Downgraded gait in a controlled environment from 50 ft to 10 ft due to slow progress. Discharged gait in a home environment goal due to slow progress and unlikely that pt will be ambulating at home upon d/c.Marland Kitchen  PT Short Term Goals Week 1:  PT Short Term Goal 1 (Week 1): Pt will perform bed<>WC transfers with mod assist consistently PT Short Term Goal 1 - Progress (Week 1): Met PT Short Term Goal 2 (Week 1): Pt will perform all bed mobility with min assist PT Short Term Goal 2 - Progress (Week 1): Met PT Short Term Goal 3 (Week  1): Pt will ambulate 10f with LRAD and mod assist PT Short Term Goal 3 - Progress (Week 1): Not met PT Short Term Goal 4 (Week 1): Pt will propell WC [id number] supervision assist PT Short Term Goal 4 - Progress (Week 1): Met Week 2:  PT Short Term Goal 1 (Week 2): Pt will complete bed to chair transfers with CGA consistently PT Short Term Goal 2 (Week 2): Pt will ambulate x 10 ft via least restrictive method PT Short Term Goal 3 (Week 2): Pt will complete bed mobility with Supervision  Skilled Therapeutic Interventions/Progress Updates:    Session 1: Pt received semi-reclined in bed. Pt reports she had a rough night and did not sleep well due to RLE pain, pt reports ongoing soreness in her RLE this AM and just received pain medication. Pt also reports use of kpad for pain relief. Pt reports urge to urinate. Squat pivot transfer bed to bedside commode with mod A. Pt is setup A for clothing management and pericare. Squat pivot transfer back to bed with mod A. Pt declines any further OOB mobility this AM 2/2 pain and nausea, agreeable to bed level strengthening exercises. Supine BLE strengthening therex: heel slides, hip abd, SAQ, SLR with AAROM for RLE, SKFO; sidelying clamshells x 10-15 reps each to fatigue. Pt does have increase in RLE pain when attempting to perform clamshells, exercise deferred. Pt left semi-reclined in bed with needs in reach, bed alarm in place at end of session.  Session 2: Pt received seated in bed, agreeable to PT session. Pt reports no pain at this  time. Supine to sit with min A. Assisted pt with donning TED hose and shoes. Squat pivot transfer bed to w/c with min A. Toilet transfer w/c to toilet with mod A. Pt is independent for clothing management and pericare. Squat pivot transfer w/c to mat table. Sit to stand x 5 reps from elevated mat table to RW and to chair with min to mod A. Pt demos poor ability to balance with RW due to posterior lean. Pt demos improved balance with  use of chair for balance. With onset of fatigue pt exhibits B knee buckling and is unable to correct more than 1x. Pt is able to stand x 30 sec maximum before onset of fatigue. Pt also has onset of dizziness and nausea in standing 2/2 vertigo but is willing to push through her symptoms to participate in therapy. Squat pivot transfer back to w/c with min A and w/c to recliner min A. Pt left reclined in recliner with needs in reach at end of session.  Therapy Documentation Precautions:  Precautions Precautions: Fall, Back Required Braces or Orthoses: Spinal Brace Spinal Brace: Lumbar corset, Applied in sitting position Restrictions Weight Bearing Restrictions: No   Therapy/Group: Individual Therapy   Excell Seltzer, PT, DPT  06/05/2019, 12:05 PM

## 2019-06-05 NOTE — Plan of Care (Signed)
Downgraded gait in a controlled environment goal to 10 ft and d/c gait in a home environment goal due to slow progress.

## 2019-06-05 NOTE — Progress Notes (Signed)
Pt has voided several times but has had BM with stool each time
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
## 2019-06-05 NOTE — Progress Notes (Signed)
Occupational Therapy Weekly Progress Note  Patient Details  Name: Tara [name] MRN: [id number] Date of Birth: [birth year]  Beginning of progress report period: May 29, 2019 End of progress report period: June 05, 2019  Today's Date: 06/05/2019 OT Individual Time: 1000-1100 OT Individual Time Calculation (min): 60 min    Patient has met 3 of 4 short term goals.  Pt is not able to consistently recall 3/3 back pre-cautions. She also requires mod cuing to adhere to back pre-cautions during functional tasks. Pt is making steady progress towards OT goals. She is most limited by vertigo symptoms which limits her ability to work on functional standing and sit>stand tasks. Pt has vestibular HEP and completes these exercises outside of tx sessions.  She is completing squat pivot transfers with min-mod A including to w/c and to drop arm BSC for toileting tasks. Pt wears dresses without underwear, therefore LB dressing consisting only of slide on shoes and therapist assisting for donning TED hose.  She remains very motivated in therapy sessions with desire to regain independence with ADLs and mobility.   Patient continues to demonstrate the following deficits:muscle weakness and muscle paralysis, decreased cardiorespiratoy endurance, ataxia and decreased coordination and decreased sitting balance, decreased standing balance, decreased postural control, decreased balance strategies and difficulty maintaining precautions and therefore will continue to benefit from skilled OT intervention to enhance overall performance with BADL and Reduce care partner burden.  Patient progressing toward long term goals..  Continue plan of care.  OT Short Term Goals Week 1:  OT Short Term Goal 1 (Week 1): Pt will consistently complete functional transfers with mod A using LRAD OT Short Term Goal 1 - Progress (Week 1): Met OT Short Term Goal 2 (Week 1): Pt will complete LB dressing with min A using AE PRN OT Short Term  Goal 2 - Progress (Week 1): Met OT Short Term Goal 3 (Week 1): Pt will recall 3/3 back pre-cautions independently. OT Short Term Goal 3 - Progress (Week 1): Progressing toward goal OT Short Term Goal 4 (Week 1): Pt will bathe with min A using AE PRN OT Short Term Goal 4 - Progress (Week 1): Met Week 2:  OT Short Term Goal 1 (Week 2): Pt will tolerate standing at least 30 seconds using LRAD in prep for functional standing task OT Short Term Goal 2 (Week 2): Pt will independently recall and follow 3/3 back pre-cautions independently during functional task OT Short Term Goal 3 (Week 2): Pt will consistently recall technique for squat pivot transfers independently  Skilled Therapeutic Interventions/Progress Updates:    Pt seen for OT ADL bathing/dressing session. PT in supine upon arrival, awake and agreeable to tx session. She voiced pain 4/10 in R LE, reports having received pain medication prior to tx session and pain downtrending.  She transferred to sitting EOB wit supervision using hospital bed functions. She completed squat pivot transfer to w/c with CGA. PT taken into bathroom and completed squat pivot transfer to standard toilet wit use of grab bars with min A. She completed toileting task seated on toilet with supervision following continent void.  She returned to w/c and completed bathing/dressing routine She completed sit>stand at sink with min A. LEs propped on trashcan for her to better reach feet when washing LEs while maintaining back pre-cautions. She was able to spread legs while seated in w/c with armrests removed in order to complete pericare. She stood with min A with B UEs while therapist provided buttock hygiene.  Pt  agreeable to stay sitting up in w/c at end of session, all needs in reach.   Therapy Documentation Precautions:  Precautions Precautions: Fall, Back Required Braces or Orthoses: Spinal Brace Spinal Brace: Lumbar corset, Applied in sitting  position Restrictions Weight Bearing Restrictions: No   Therapy/Group: Individual Therapy  Asal Teas L 06/05/2019, 6:59 AM

## 2019-06-06 ENCOUNTER — Inpatient Hospital Stay (HOSPITAL_COMMUNITY): Payer: Medicare Other | Admitting: Occupational Therapy

## 2019-06-06 ENCOUNTER — Inpatient Hospital Stay (HOSPITAL_COMMUNITY): Payer: Medicare Other | Admitting: Physical Therapy

## 2019-06-06 LAB — URINALYSIS, COMPLETE (UACMP) WITH MICROSCOPIC
Bilirubin Urine: NEGATIVE
Glucose, UA: NEGATIVE mg/dL
Ketones, ur: NEGATIVE mg/dL
Nitrite: NEGATIVE
Protein, ur: 30 mg/dL — AB
Specific Gravity, Urine: 1.01 (ref 1.005–1.030)
WBC, UA: >50 WBC/hpf — ABNORMAL HIGH (ref 0–5)
pH: 6 (ref 5.0–8.0)

## 2019-06-06 LAB — CBC
HCT: 31.9 % — ABNORMAL LOW (ref 36.0–46.0)
Hemoglobin: 10.5 g/dL — ABNORMAL LOW (ref 12.0–15.0)
MCH: 32.5 pg (ref 26.0–34.0)
MCHC: 32.9 g/dL (ref 30.0–36.0)
MCV: 98.8 fL (ref 80.0–100.0)
Platelets: 470 10*3/uL — ABNORMAL HIGH (ref 150–400)
RBC: 3.23 MIL/uL — ABNORMAL LOW (ref 3.87–5.11)
RDW: 13.9 % (ref 11.5–15.5)
WBC: 8.2 10*3/uL (ref 4.0–10.5)
nRBC: 0 % (ref 0.0–0.2)

## 2019-06-06 LAB — BASIC METABOLIC PANEL
Anion gap: 13 (ref 5–15)
BUN: 7 mg/dL — ABNORMAL LOW (ref 8–23)
CO2: 26 mmol/L (ref 22–32)
Calcium: 8.7 mg/dL — ABNORMAL LOW (ref 8.9–10.3)
Chloride: 101 mmol/L (ref 98–111)
Creatinine, Ser: 0.73 mg/dL (ref 0.44–1.00)
GFR calc Af Amer: >60 mL/min (ref >60)
GFR calc non Af Amer: >60 mL/min (ref >60)
Glucose, Bld: 105 mg/dL — ABNORMAL HIGH (ref 70–99)
Potassium: 3 mmol/L — ABNORMAL LOW (ref 3.5–5.1)
Sodium: 140 mmol/L (ref 135–145)

## 2019-06-06 MED ORDER — NITROFURANTOIN MONOHYD MACRO 100 MG PO CAPS
100.0000 mg | ORAL_CAPSULE | Freq: Two times a day (BID) | ORAL | Status: AC
  Administered 2019-06-06 – 2019-06-15 (×20): 100 mg via ORAL
  Filled 2019-06-06 (×20): qty 1

## 2019-06-06 NOTE — Progress Notes (Signed)
Physical Therapy Session Note  Patient Details  Name: Tara [name] MRN: [id number] Date of Birth: 30-Nov-1935  Today's Date: 06/06/2019 PT Individual Time: 1030-1130; 1400-1500 PT Individual Time Calculation (min): 60 min and 60 min  Short Term Goals: Week 2:  PT Short Term Goal 1 (Week 2): Pt will complete bed to chair transfers with CGA consistently PT Short Term Goal 2 (Week 2): Pt will ambulate x 10 ft via least restrictive method PT Short Term Goal 3 (Week 2): Pt will complete bed mobility with Supervision  Skilled Therapeutic Interventions/Progress Updates:    Session 1: Pt received seated in w/c at sink finishing her hair and makeup, agreeable to PT session. No complaints of pain. Manual w/c propulsion x 150 ft with use of BUE and Supervision. Sit to stand with min A in // bars x 5 reps. Ambulation 2 x 5 ft with RW and max A for knees blocked and to maintain standing balance. Pt continues to exhibit BLE weakness limiting her ability to perform functional gait. However pt exhibits improved tolerance for standing this date with no episodes of dizziness. Standing forward/backward steps 2 x 5 reps B with mod A for standing balance. Pt left seated in w/c in room with needs in reach at end of session.  Session 2: Pt received seated in w/c in room, agreeable to PT session. No complaints of pain and pt in cheerful spirits this PM. Dependent transport via w/c to therapy gym for time conservation. Sit to stand in // bars with min A. Ambulation 3 x 10 ft in // bars with mod A with R knee blocked in stance, v/c for wider BOS and for R quad activation, close w/c follow for safety. Pt demos improved standing tolerance and ability to perform gait this date. Standing tolerance 3 x 1 min with manual cueing through RLE for knee ext in standing while engaging in card game. Cotreatment session with RT. Toilet transfer with min A and use of grab bar. Pt left seated in w/c in room with needs in reach at end of  session.   Therapy Documentation Precautions:  Precautions Precautions: Fall, Back Required Braces or Orthoses: Spinal Brace Spinal Brace: Lumbar corset, Applied in sitting position Restrictions Weight Bearing Restrictions: No    Therapy/Group: Individual Therapy   Excell Seltzer, PT, DPT  06/06/2019, 12:06 PM

## 2019-06-06 NOTE — Progress Notes (Signed)
ANTICOAGULATION CONSULT NOTE - Follow Up Consult  Pharmacy Consult for Xarelto Indication: DVT  Allergies  Allergen Reactions  . Ceclor [Cefaclor] Hives and Itching    Patient Measurements: Height: 5\' 3"  (160 cm) Weight: 161 lb 10.4 oz (73.3 kg) IBW/kg (Calculated) : 52.4  Vital Signs: Temp: 97.4 F (36.3 C) (07/16 0508) BP: 128/79 (07/16 0508) Pulse Rate: 101 (07/16 0508)  Labs: No results for input(s): HGB, HCT, PLT, APTT, LABPROT, INR, HEPARINUNFRC, HEPRLOWMOCWT, CREATININE, CKTOTAL, CKMB, TROPONINIHS in the last 72 hours.  Estimated Creatinine Clearance: 51.9 mL/min (by C-G formula based on SCr of 0.83 mg/dL).   Assessment: 83 yo female with Bilateral lower extremity Dopplers 05/28/2019 show DVT right popliteal vein, right posterior tibial vein and right gastrocnemius vein as well as left lower extremity DVT left peroneal vein and left gastroc vein. Pharmacy consulted for Xarelto. Patient has received education per pharmacy. No CBC since stable on 7/8. No reported active bleeding.    Goal of Therapy:  Monitor platelets by anticoagulation protocol: Yes   Plan:  Continue Xarelto 15mg  BID for 21 days, transition to 20mg  daily on 7/28 Monitor for S&S of bleeding   Cristela Felt, PharmD PGY1 Pharmacy Resident [employer]: [phone]   06/06/2019,8:17 AM

## 2019-06-06 NOTE — Patient Care Conference (Signed)
Inpatient RehabilitationTeam Conference and Plan of Care Update Date: 06/04/2019   Time: 2:15 PM    Patient Name: Tara [name] Record Number: [id number]  Date of Birth: Apr 11, 1936 Sex: Female         Room/Bed: 4W25C/4W25C-01 Payor Info: Payor: MEDICARE / Plan: MEDICARE PART A AND B / Product Type: *No Product type* /    Admitting Diagnosis: 1. SCI Team  Other Neuro, lumbar radiculopathy; 15-17days  Admit Date/Time:  05/28/2019 12:29 PM Admission Comments: No comment available   Primary Diagnosis:  <principal problem not specified> Principal Problem: <principal problem not specified>  Patient Active Problem List   Diagnosis Date Noted  . Anxiety state   . Acute blood loss anemia   . Lumbar radiculopathy 05/28/2019  . Postoperative pain   . Drug induced constipation   . History of TIA (transient ischemic attack)   . Lumbar spinal stenosis 05/23/2019  . HNP (herniated nucleus pulposus), lumbar 05/23/2019  . TIA (transient ischemic attack) 07/02/2018  . Chronic nonintractable headache 07/02/2018  . S/P total knee replacement 12/25/2017  . Ductal carcinoma in situ of right breast 08/17/2015  . Breast cancer of upper-outer quadrant of right female breast ([place]) 08/14/2015  . Right hip pain 10/27/2012  . Low back pain 10/27/2012  . Hypertension   . Osteoarthritis of right knee 11/30/2011  . Baker's cyst of knee 11/30/2011  . Dependent edema 06/19/2011  . Prediabetes 06/19/2011  . Urinary tract infection, recurrent 06/19/2011  . Urinary incontinence 06/19/2011  . Benign positional vertigo 06/19/2011  . Hypothyroidism 06/19/2011  . Tubular adenoma of colon 06/19/2011  . Essential hypertension, benign 04/15/2011    Expected Discharge Date: Expected Discharge Date: 06/19/19  Team Members Present: Physician leading conference: Dr. Alger Simons Social Worker Present: Lennart Pall, LCSW Nurse Present: Mohammed Kindle, RN PT Present: Excell Seltzer, PT OT Present: Amy  Rounds, OT SLP Present: Weston Anna, SLP PPS Coordinator present : Gunnar Fusi, SLP     Current Status/Progress Goal Weekly Team Focus  Medical   lumbar radiculopathy with low back pain and right>left leg weakness and pain. decreasing pain medication per family request, chronic vertigo  improve motor control RLE  pain mgt, education re: neurological education/ prognosis   Bowel/Bladder   Continent of bowel and bladder  Remain continent of bowel and bladder  assess tolieting needs shift/prn   Swallow/Nutrition/ Hydration             ADL's   Min A squat pivot transfers; mod A LB bathing/dressing; set-up UB bathing/dressing and grooming; min A toileting  CGA overall  ADL re-training, functional transfers, functional activity tolerance, sit>stand and standing balance/endurance   Mobility   min A bed mobility, min A squat pivot transfer, min A sit to stand, gait mod to max x 5' in // bars, Supervision w/c mobility  Supervision to min A overall  gait training, LE strengthening and NMR   Communication             Safety/Cognition/ Behavioral Observations            Pain   6/10 pain in rt leg, and lower back. Tramadol given as ordered  2/10  assess pain q shift, medication prn   Skin   no skin issues  remain free of skin breakdown  assess qshift/prn    Rehab Goals Patient on target to meet rehab goals: Yes *See Care Plan and progress notes for long and short-term goals.  Barriers to Discharge  Current Status/Progress Possible Resolutions Date Resolved   Physician    Medical stability  pain, pre-existing vertigo            Nursing                  PT                    OT                  SLP                SW                Discharge Planning/Teaching Needs:  [place] with family and friends to assist.  No confirmed plan of how she would arrange 24/7 support if this is what is recommended.  Teaching needs TBD   Team Discussion:   Stenosis, radiculopathy; motor-sensory deficits.  MD anticipates this will be a prolonged recovery overall.  Cont b/b;  Pain is primary barrier - plan to limit sedating meds and able to make it through today's session with only tylenol.  Min-mod tfs.  Min assist to stand.  Having some vertigo.  Overall goals for CGA - SW to follow up with pt and family about need for 24/7 support.  Revisions to Treatment Plan:  NA    Continued Need for Acute Rehabilitation Level of Care: The patient requires daily medical management by a physician with specialized training in physical medicine and rehabilitation for the following conditions: Daily direction of a multidisciplinary physical rehabilitation program to ensure safe treatment while eliciting the highest outcome that is of practical value to the patient.: Yes Daily medical management of patient stability for increased activity during participation in an intensive rehabilitation regime.: Yes Daily analysis of laboratory values and/or radiology reports with any subsequent need for medication adjustment of medical intervention for : Post surgical problems;Neurological problems   I attest that I was present, lead the team conference, and concur with the assessment and plan of the team.   Donata Clay, Gaige Sebo 06/06/2019, 11:14 AM    Team conference was held via web/ teleconference due to [place] - 19

## 2019-06-06 NOTE — Progress Notes (Signed)
Occupational Therapy Session Note  Patient Details  Name: Tara [name] MRN: [id number] Date of Birth: [birth year]  Today's Date: 06/06/2019 OT Individual Time: 2334-3568 OT Individual Time Calculation (min): 70 min    Short Term Goals: Week 2:  OT Short Term Goal 1 (Week 2): Pt will tolerate standing at least 30 seconds using LRAD in prep for functional standing task OT Short Term Goal 2 (Week 2): Pt will independently recall and follow 3/3 back pre-cautions independently during functional task OT Short Term Goal 3 (Week 2): Pt will consistently recall technique for squat pivot transfers independently  Skilled Therapeutic Interventions/Progress Updates:    Pt seen for OT ADL bathing/dressing session. Pt in supine upon arrival, agreeable to tx session and no formal complaints of pain.  She transferred to sitting EOB with supervision using hospital bed functions. Completed min-mod A squat pivot transfers throughout session including EOB>w/c, w/c<> toilet with use og grab bar and w/c <> tub bench in shower using grab bar. Min cuing for hand placement and technique for transfer. Completed toileting task with supervision wit leans for hygiene. She transitioned to shower and bathed seated on tub transfer bench. Set-up UB bathing, assist for LEs as pt unable to reach while maintaining spinal pre-cautions, pt will benefit from [facility] sponge. LAteral leans for pericare/buttock hygiene. She transferred back to w/c and completed hair drying and styling routine from w/c level at sink with assist for set-up. Education provided regarding energy conservation techniques for bathing/dressing routine at home. Donned dress with set-up assist. Total A for TED hose and slide on shoes. Pt left sitting at sink in w/c at end of session finishing grooming tasks. NT made aware of pt's position and call bell in reach.   Therapy Documentation Precautions:  Precautions Precautions: Fall, Back Required Braces or Orthoses:  Spinal Brace Spinal Brace: Lumbar corset, Applied in sitting position Restrictions Weight Bearing Restrictions: No   Therapy/Group: Individual Therapy  Mellany Dinsmore L 06/06/2019, 7:01 AM
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
## 2019-06-06 NOTE — Progress Notes (Signed)
Social Work Patient ID: Tara [name], female   DOB: 11-25-1935, 83 y.o.   MRN: [id number]  Have reviewed team conference with pt's son and with pt who are both aware of targeted d/c date of 7/29 and CGA goals.  Pt very pleased today with her progress and insists that she will "prove you wrong" and anticipates she will reach higher level and be able to leave sooner than target.  Pt reports feeling "so much better with just the muscle relaxer" for pain management.  Will plan to f/u with team at start of next week to determine if they feel date could be moved.  Dantae Meunier, LCSW

## 2019-06-07 ENCOUNTER — Inpatient Hospital Stay (HOSPITAL_COMMUNITY): Payer: Medicare Other | Admitting: *Deleted

## 2019-06-07 ENCOUNTER — Inpatient Hospital Stay (HOSPITAL_COMMUNITY): Payer: Medicare Other | Admitting: Occupational Therapy

## 2019-06-07 ENCOUNTER — Inpatient Hospital Stay (HOSPITAL_COMMUNITY): Payer: Medicare Other | Admitting: Physical Therapy

## 2019-06-07 LAB — URINE CULTURE: Culture: >=100000 — AB

## 2019-06-07 MED ORDER — POTASSIUM CHLORIDE CRYS ER 20 MEQ PO TBCR
20.0000 meq | EXTENDED_RELEASE_TABLET | Freq: Every day | ORAL | Status: DC
  Administered 2019-06-07 – 2019-06-19 (×13): 20 meq via ORAL
  Filled 2019-06-07 (×14): qty 1
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
## 2019-06-07 NOTE — Progress Notes (Signed)
Physical Therapy Session Note  Patient Details  Name: Tara [name] MRN: [id number] Date of Birth: September 27, 1936  Today's Date: 06/07/2019 PT Individual Time: 1110-1204 PT Individual Time Calculation (min): 54 min   Short Term Goals: Week 2:  PT Short Term Goal 1 (Week 2): Pt will complete bed to chair transfers with CGA consistently PT Short Term Goal 2 (Week 2): Pt will ambulate x 10 ft via least restrictive method PT Short Term Goal 3 (Week 2): Pt will complete bed mobility with Supervision  Skilled Therapeutic Interventions/Progress Updates:  Pt received in w/c & agreeable to tx. No c/o pain reported. Session started late 2/2 pt requesting to call family & pt perseverating on lost clothing throughout session. Pt propels w/c room>gym>dayroom>room with BUE & supervision. Pt set up in parallel bars & transfers sit<>stand with min assist with cuing for hand placement with poor recall of instructions between sit<>stand trials. Pt ambulates 8 ft + 4 ft forwards + backwards in parallel bars with mod assist with therapist blocking B knee 2/2 buckling (R>L, pt with poor awareness as she reports LLE is weaker & buckling), with cuing for increased base of support, and pt with impaired neuromuscular control of BLE. Pt utilized kinetron from w/c level with task focusing on BLE strengthening (3 bouts x 1 minute each). Pt performed long arc quads & hip flexion while seated in w/c with 3# ankle weights on each BLE with obvious weakness in RLE compared to LLE with task focusing on BLE strengthening. At end of session pt left sitting in w/c with chair alarm donned & all needs at hand.   Therapy Documentation Precautions:  Precautions Precautions: Fall, Back Required Braces or Orthoses: Spinal Brace Spinal Brace: Lumbar corset, Applied in sitting position Restrictions Weight Bearing Restrictions: No      Therapy/Group: Individual Therapy  Waunita Schooner 06/07/2019, 12:10 PM

## 2019-06-07 NOTE — Progress Notes (Signed)
Occupational Therapy Session Note  Patient Details  Name: Tara [name] MRN: [id number] Date of Birth: [birth year]  Today's Date: 06/07/2019 OT Individual Time: 0845-1000 and 1300-1325 OT Individual Time Calculation (min): 75 min and 25 min OT Missed Time: 35 min (personal reasons)   Short Term Goals: Week 2:  OT Short Term Goal 1 (Week 2): Pt will tolerate standing at least 30 seconds using LRAD in prep for functional standing task OT Short Term Goal 2 (Week 2): Pt will independently recall and follow 3/3 back pre-cautions independently during functional task OT Short Term Goal 3 (Week 2): Pt will consistently recall technique for squat pivot transfers independently  Skilled Therapeutic Interventions/Progress Updates:    Session One: Pt seen for OT ADL bathing/ dressing session. Pt sitting up in w/c upon arrival, agreeable to tx session and denying pain. Pt reports already having completed grooming and some UB dressing, requesting to complete LB bathing. Completed with min A from w/c level with Les propped on stool for easier access. She was able to cross 1 LE into figure four position to wash foot with assist to maintain position, unable to do that with other extremity and therefore assist provided. She completed stand pivot transfer to toilet with use of grab bars, min A to power into standing and mod A for controlled descent onto toilet. Pt able to complete pericare/buttock hygiene seated on toilet with set-up. She donned dress with set-up and assist for TED hose and slide on shoes. Pt switched to [name] chair in hopes of having chair more accessible to rooms in home, pt tolerated smaller chair well. She self propelled w/c throughout unit with supervision. Completed squat pivot transfers w/c <> EOM with CGA and min questioning cues for proper set-up of w/c in prep for transfer.  She completed x4 sit>stands from elevated EOM. Initially requiring min A with one hand on mat and one on RW to  power into standing, however, on 3rd and 4th trials required B UEs on RW to power into standing with min A. #3 ankle weights placed on B LEs for increased proprioceptive input and multiple modal cuing used to assist pt in achieving wide BOS in standing. Pt reports minimal vertigo like symptoms and tolerated ~20 seconds in standing each trial before requiring seated rest break.  Pt returned to w/c at end of session and returned to room. Pt left sitting up in w/c with all needs in reach.  Education/discussion throughout session regarding modified ADLs, DME, increasing independence with ADLs, back pre-cautions, activity progression and d/c planning.   Session Two: Pt sleeping soundly upon arrival, snoring loudly and taking increased time and stimuli for arousal. She remained slightly confused and lethargic throughout session, RN made aware of pt's behaviors at end of session.  She transferred to sitting EOB with min A using hospital bed functions. Back brace donned with assist, she was able to slide on slip on shoes with increased time. Requested toileting task. Min-mod squat pivot transfer to w/c and mod A squat pivot transfer to toilet with use of grab bars. Toileting task completed with supervision. Pt then received phone call from son, attempting to coordinate pt getting more clothes and son attempting to get into her apartment. Therapist assisted due to pt's confusion. Pt requesting to end tx session as she attempted to make phone calls and coordinate getting clothes as she does not have anymore.  Pt left sitting up in w/c at end of session, chair belt alarm on and all  needs in reach.      Therapy Documentation Precautions:  Precautions Precautions: Fall, Back Required Braces or Orthoses: Spinal Brace Spinal Brace: Lumbar corset, Applied in sitting position Restrictions Weight Bearing Restrictions: No   Therapy/Group: Individual Therapy  [name] L 06/07/2019, 6:44 AM

## 2019-06-07 NOTE — Evaluation (Signed)
Recreational Therapy Assessment and Plan  Patient Details  Name: Tara [name] MRN: [id number] Date of Birth: 1936/02/04 Today's Date: 06/07/2019  Rehab Potential: Good ELOS: discharge 7/29   Assessment  Problem List:      Patient Active Problem List   Diagnosis Date Noted  . Lumbar radiculopathy 05/28/2019  . Postoperative pain   . Drug induced constipation   . History of TIA (transient ischemic attack)   . Lumbar spinal stenosis 05/23/2019  . HNP (herniated nucleus pulposus), lumbar 05/23/2019  . TIA (transient ischemic attack) 07/02/2018  . Chronic nonintractable headache 07/02/2018  . S/P total knee replacement 12/25/2017  . Ductal carcinoma in situ of right breast 08/17/2015  . Breast cancer of upper-outer quadrant of right female breast ([place]) 08/14/2015  . Right hip pain 10/27/2012  . Low back pain 10/27/2012  . Hypertension   . Osteoarthritis of right knee 11/30/2011  . Baker's cyst of knee 11/30/2011  . Dependent edema 06/19/2011  . Prediabetes 06/19/2011  . Urinary tract infection, recurrent 06/19/2011  . Urinary incontinence 06/19/2011  . Benign positional vertigo 06/19/2011  . Hypothyroidism 06/19/2011  . Tubular adenoma of colon 06/19/2011  . Essential hypertension, benign 04/15/2011    Past Medical History:      Past Medical History:  Diagnosis Date  . Arthritis    knees  . Breast cancer ([place]) 09/30/15   right breast  . Dependent edema   . Depression    just lost spouse in Nov. 2018  . Essential hypertension, benign 04/15/2011  . Herniated lumbar disc without myelopathy   . Hx of staphylococcal infection   . Hypertension   . Hypothyroidism   . Obesity   . Pre-diabetes   . Radicular pain   . Recurrent UTI   . Stroke [facility])    ' mini"  . Thyroid disease    hypothyroidism  . Vertigo   . Wears contact lenses    one   Past Surgical History:       Past Surgical History:  Procedure Laterality Date  . ANKLE  SURGERY  1994   RIGHT  . BACK SURGERY    . BREAST LUMPECTOMY WITH NEEDLE LOCALIZATION Right 09/30/2015   Procedure: RIGHT BREAST LUMPECTOMY WITH NEEDLE LOCALIZATION;  Surgeon: Fanny Skates, MD;  Location: [place];  Service: General;  Laterality: Right;  . BREAST SURGERY    . COLONOSCOPY W/ BIOPSIES AND POLYPECTOMY    . EYE SURGERY     bilateral  . HAND SURGERY  1951   RIGHT HAND  . JOINT REPLACEMENT     rt shoulder rotator cuff  . KNEE ARTHROSCOPY     left knee  . LUMBAR LAMINECTOMY/DECOMPRESSION MICRODISCECTOMY Right 01/11/2013   Procedure: LUMBAR LAMINECTOMY/DECOMPRESSION MICRODISCECTOMY 1 LEVEL,Right Lumbar Three-Four;  Surgeon: Elaina Hoops, MD;  Location: [place] [facility];  Service: Neurosurgery;  Laterality: Right;  . ROTATOR CUFF REPAIR  2011   RIGHT  . TONSILLECTOMY    . TOTAL KNEE ARTHROPLASTY     Left  . TOTAL KNEE ARTHROPLASTY Right 12/25/2017   Procedure: TOTAL KNEE ARTHROPLASTY;  Surgeon: Vickey Huger, MD;  Location: [place];  Service: Orthopedics;  Laterality: Right;    Assessment & Plan Clinical Impression: Patient is a 83 year old right-handed female with history of hypertension, hypothyroidism,left TKA 2019, TIA maintained on aspirin, lumbar laminectomy with decompression 2014. History taken from chart review and patient. Chart review, patient was alone in an independent living facility/[facility]. Patient used a walker and quad cane  prior to admission. Presented 05/23/2019 with low back pain radiating to the left lower extremity. Work-up and imaging revealed critical stenosis L3-4 With disc herniation and possible recurrence of synovial cyst with radiculopathy. Underwent redo decompressive lumbar laminectomy L3-4 with complete medial facetectomies and radical foraminotomies of the L4 and L3 nerve roots posterior lumbar interbody fusion 05/23/2019 per Dr. Saintclair Halsted. Hospital course complicated by pain. Routine back precautions with  back brace when out of bed. Patient transferred to CIR on 05/28/2019 .  Pt presents with decreased activity tolerance, decreased functional mobility, decreased balance, muscle paralysis, decreased cardiorespiratoy endurance, decreased problem solving, decreased safety awareness, decreased memory and delayed processing limiting pt's independence with leisure/community pursuits.   Leisure History/Participation Premorbid leisure interest/current participation: Games - Theatre stage manager - Cards;Community - Doctor, hospital - Grocery store;Community - Travel (Comment) Expression Interests: Music (Comment) Other Leisure Interests: Television;Movies;Reading;Computer Leisure Participation Style: With Family/Friends Awareness of Community Resources: Excellent Psychosocial / Spiritual Social interaction - Mood/Behavior: Cooperative Academic librarian Appropriate for Education?: Yes Recreational Therapy Orientation Orientation -Reviewed with patient: Available activity resources Strengths/Weaknesses Patient Strengths/Abilities: Willingness to participate;Active premorbidly TR Patient demonstrates impairments in the following area(s): Endurance;Motor;Pain;Safety  Plan Rec Therapy Plan Is patient appropriate for Therapeutic Recreation?: Yes Rehab Potential: Good Treatment times per week: Min 1 TR session >20 minutes each week during LOS Estimated Length of Stay: discharge 7/29 TR Treatment/Interventions: Adaptive equipment instruction;Community reintegration;Therapeutic exercise;Patient/family education;1:1 session;Functional mobility training;Balance/vestibular training;Recreation/leisure participation;Therapeutic activities;Wheelchair propulsion/positioning  Recommendations for other services: None   Discharge Criteria: Patient will be discharged from TR if patient refuses treatment 3 consecutive times without medical reason.  If treatment goals not met, if there is a change in medical  status, if patient makes no progress towards goals or if patient is discharged from hospital.  The above assessment, treatment plan, treatment alternatives and goals were discussed and mutually agreed upon: by patient  [place] 06/07/2019, 4:17 PM

## 2019-06-08 ENCOUNTER — Other Ambulatory Visit: Payer: Medicare Other

## 2019-06-08 ENCOUNTER — Inpatient Hospital Stay (HOSPITAL_COMMUNITY): Payer: Medicare Other

## 2019-06-08 DIAGNOSIS — K5900 Constipation, unspecified: Secondary | ICD-10-CM

## 2019-06-08 DIAGNOSIS — G8220 Paraplegia, unspecified: Secondary | ICD-10-CM

## 2019-06-08 MED ORDER — RIVAROXABAN 15 MG PO TABS
15.0000 mg | ORAL_TABLET | Freq: Two times a day (BID) | ORAL | Status: AC
  Administered 2019-06-08 – 2019-06-17 (×19): 15 mg via ORAL
  Filled 2019-06-08 (×19): qty 1

## 2019-06-08 MED ORDER — RIVAROXABAN 20 MG PO TABS
20.0000 mg | ORAL_TABLET | Freq: Every day | ORAL | Status: DC
  Administered 2019-06-18: 20 mg via ORAL
  Filled 2019-06-08: qty 1

## 2019-06-08 NOTE — Plan of Care (Signed)
  Problem: Consults Goal: RH SPINAL CORD INJURY PATIENT EDUCATION Description:  See Patient Education module for education specifics.  Outcome: Progressing   Problem: SCI BOWEL ELIMINATION Goal: RH STG MANAGE BOWEL WITH ASSISTANCE Description: STG Manage Bowel with min Assistance. Outcome: Progressing   Problem: SCI BLADDER ELIMINATION Goal: RH STG MANAGE BLADDER WITH ASSISTANCE Description: STG Manage Bladder With Min Assistance Outcome: Progressing   Problem: RH SKIN INTEGRITY Goal: RH STG SKIN FREE OF INFECTION/BREAKDOWN Description: No new breakdown with mod I assist Outcome: Progressing Goal: RH STG ABLE TO PERFORM INCISION/WOUND CARE W/ASSISTANCE Description: STG Able To Perform Incision/Wound Care With [employer]. Outcome: Progressing   Problem: RH SAFETY Goal: RH STG ADHERE TO SAFETY PRECAUTIONS W/ASSISTANCE/DEVICE Description: STG Adhere to Safety Precautions With Mod I Assistance/Device. Outcome: Progressing   Problem: RH PAIN MANAGEMENT Goal: RH STG PAIN MANAGED AT OR BELOW PT'S PAIN GOAL Description: < 3 out of 10.  Outcome: Progressing

## 2019-06-08 NOTE — Progress Notes (Signed)
Physical Therapy Session Note  Patient Details  Name: Tara [name] MRN: [id number] Date of Birth: Apr 19, 1936  Today's Date: 06/08/2019 PT Individual Time: 1002-1045 PT Individual Time Calculation (min): 43 min   Short Term Goals: Week 2:  PT Short Term Goal 1 (Week 2): Pt will complete bed to chair transfers with CGA consistently PT Short Term Goal 2 (Week 2): Pt will ambulate x 10 ft via least restrictive method PT Short Term Goal 3 (Week 2): Pt will complete bed mobility with Supervision  Skilled Therapeutic Interventions/Progress Updates:    Pt seated in w/c upon PT arrival, agreeable to therapy tx and reports pain 4/10 in groin/R hip. Pt propelled w/c to the gym x 150 ft using B UEs with supervision. Pt performed stand pivot to the mat with min assist, cues for techniques. Pt performed x 3 sit<>stands this session from the edge of the mat with min assist and RW, cues for techniques and therapist blocking R knee in standing. While in standing pt worked on standing balance with single UE support and static standing without UE support 3-10 sec bouts, pt with increased posterior lean requiring cues and facilitation to correct. Pt transferred to supine and performed LE therex for strengthening including x10 of each: heel slides, SAQ and bridges (unable to fully clear hips and facilitation for LE weightbearing/positioning). Pt transferred back to sitting with mod assist and stand pivot to w/c with mod assist. Pt transported back to room and left in w/c with needs in reach and chair alarm set.   Therapy Documentation Precautions:  Precautions Precautions: Fall, Back Required Braces or Orthoses: Spinal Brace Spinal Brace: Lumbar corset, Applied in sitting position Restrictions Weight Bearing Restrictions: No    Therapy/Group: Individual Therapy  Netta Corrigan, PT, DPT 06/08/2019, 7:54 AM
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
## 2019-06-09 ENCOUNTER — Inpatient Hospital Stay (HOSPITAL_COMMUNITY): Payer: Medicare Other

## 2019-06-09 LAB — BASIC METABOLIC PANEL
Anion gap: 8 (ref 5–15)
BUN: 6 mg/dL — ABNORMAL LOW (ref 8–23)
CO2: 28 mmol/L (ref 22–32)
Calcium: 8.7 mg/dL — ABNORMAL LOW (ref 8.9–10.3)
Chloride: 104 mmol/L (ref 98–111)
Creatinine, Ser: 0.81 mg/dL (ref 0.44–1.00)
GFR calc Af Amer: >60 mL/min (ref >60)
GFR calc non Af Amer: >60 mL/min (ref >60)
Glucose, Bld: 107 mg/dL — ABNORMAL HIGH (ref 70–99)
Potassium: 3.6 mmol/L (ref 3.5–5.1)
Sodium: 140 mmol/L (ref 135–145)

## 2019-06-09 NOTE — Progress Notes (Signed)
Physical Therapy Session Note  Patient Details  Name: Tara [name] MRN: [id number] Date of Birth: 11/18/1936  Today's Date: 06/09/2019 PT Individual Time: 1030-1120 PT Individual Time Calculation (min): 50 min   Short Term Goals:  Week 2:  PT Short Term Goal 1 (Week 2): Pt will complete bed to chair transfers with CGA consistently PT Short Term Goal 2 (Week 2): Pt will ambulate x 10 ft via least restrictive method PT Short Term Goal 3 (Week 2): Pt will complete bed mobility with Supervision  Skilled Therapeutic Interventions/Progress Updates:  Pt sitting up in w/c. Pt noted to be sitting in L wind-swept position.  Cushion found to be offset in w/c.  She stated that her bottom had been hurting.  PT educated pt on asked for someone to check w/c if this happens.  W/c propulsion using bil UEs on level tile, x 150'.  Cues for efficiency and turns.  PT switched R footrest ,to position pt in 90 degree angles at hip, knee for improved pelvic positioning in w/c and neutral position of R hip.  neuromuscular re-education via multimodal cues for R/L hip flexion, knee extension to place q foot on/off footrests, x 5.  With extended R knee, ankle DF focusing on eversion, x 10.  Pelvic dissociation R/L, for scooting forward/backward in w/c.   Pre-gait activities: sit>< stand in // with mod assist pushing up with bil hands on armrests of w/c.  In standing, R><L wt shifting focusing on R knee stance stability; pt able to extend L knee in wt bearing almost to full extension.  Gait forward/backward in // with mod assist for RLE progression and stance stability, mod assist.  At end of session, pt sitting in w/c with needs at hand and seat belt alarm set.       Therapy Documentation Precautions:  Precautions Precautions: Fall, Back Required Braces or Orthoses: Spinal Brace Spinal Brace: Lumbar corset, Applied in sitting position Restrictions Weight Bearing Restrictions: No    Pain: pt denies,  premedicated       Therapy/Group: Individual Therapy  Sheila Ocasio 06/09/2019, 12:25 PM
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
## 2019-06-10 ENCOUNTER — Inpatient Hospital Stay (HOSPITAL_COMMUNITY): Payer: Medicare Other | Admitting: Occupational Therapy

## 2019-06-10 ENCOUNTER — Inpatient Hospital Stay (HOSPITAL_COMMUNITY): Payer: Medicare Other | Admitting: Physical Therapy

## 2019-06-10 NOTE — Progress Notes (Signed)
Physical Therapy Session Note  Patient Details  Name: Tara [name] MRN: [id number] Date of Birth: [birth year]  Today's Date: 06/10/2019 PT Individual Time: 1000-1100 PT Individual Time Calculation (min): 60 min   Short Term Goals: Week 2:  PT Short Term Goal 1 (Week 2): Pt will complete bed to chair transfers with CGA consistently PT Short Term Goal 2 (Week 2): Pt will ambulate x 10 ft via least restrictive method PT Short Term Goal 3 (Week 2): Pt will complete bed mobility with Supervision  Skilled Therapeutic Interventions/Progress Updates:    Pt received seated in bed, agreeable to PT session. No complaints of pain. Supine to sit with CGA with use of bedrail. Pt is min A to don LSO and shoes while seated EOB. Squat pivot transfer bed to w/c with min A. Manual w/c propulsion x 150 ft with use of BUE and Supervision. Squat pivot transfer w/c to mat table with min A. Sit to stand with min A to RW, focus on anterior weight shift when standing. Alt L/R forward/backward step with RW and min A for balance, min manual cueing through RLE for quad activation in stance. Stand pivot transfer mat table to w/c with RW and mod A for balance, scissoring of BLE. Ambulation x 5 ft with RW and min A for balance with close w/c follow for safety. Pt exhibits BLE buckling with gait as well as narrow BOS. Pt also has onset of vertigo symptoms while standing during gait training. Squat pivot transfer back to bed min A. Sit to supine Supervision with increased time and use of bedrails. Pt left semi-reclined in bed with needs in reach, bed alarm in place.  Therapy Documentation Precautions:  Precautions Precautions: Fall, Back Required Braces or Orthoses: Spinal Brace Spinal Brace: Lumbar corset, Applied in sitting position Restrictions Weight Bearing Restrictions: No    Therapy/Group: Individual Therapy   Excell Seltzer, PT, DPT  06/10/2019, 12:18 PM
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
## 2019-06-10 NOTE — Progress Notes (Signed)
Occupational Therapy Session Note  Patient Details  Name: Tara [name] MRN: [id number] Date of Birth: 1936-01-26  Today's Date: 06/10/2019 OT Individual Time: [id number] and 1330-1415 OT Individual Time Calculation (min): 60 min and 45 min OT Missed Time: 30 min (late lunch)   Short Term Goals: Week 2:  OT Short Term Goal 1 (Week 2): Pt will tolerate standing at least 30 seconds using LRAD in prep for functional standing task OT Short Term Goal 2 (Week 2): Pt will independently recall and follow 3/3 back pre-cautions independently during functional task OT Short Term Goal 3 (Week 2): Pt will consistently recall technique for squat pivot transfers independently  Skilled Therapeutic Interventions/Progress Updates:    Session One: Pt seen for OT session focusing on ADL Re-training. Pt sitting up in w/c upon arrival, putting on make-up and completing UB bathing/dressing. She denied pain and ready for x session focusing on LB ADLs.  She stood at sink throughout session with min A using sink ledge. She completed buttock hygiene with steadying assist, VCs for maintaining spinal pre-cautions.  Following instruction, pt threaded pants on using reacher. She stood with RW and min-mod A for steadying while pulling pants up.  She self- propelled w/c throughout unit with supervision, VCs for effective w/c propulsion technique. She required mod questioning cues for proper set-up of w/c in prep for squat pivot transfer. She completed squat pivot transfer w/c<> EOM with close supervision.  Completed x4 sit>stand from EOM with RW. mIn A in increased time to power into standing. Mirror placed for visual feedback of LE placement and attempt for visual of weightshift as pt bearing weight primarily on L LE with decreased proprioceptive input into weightshift. Education provided regarding spinal cord, sensory and motor tracts and functional implications in regards to weightshift and standing balance.  Pt returned  to w/c at end of session and taken back to room. She was left seated in w/c with chair belt alarm on, plans to move around in w/c organizing room with assist of reacher.   Session Two: Therapist arrived at 1300 for scheduled OT session. Pt just received lunch tray and requesting therapist to return at later time. Therapist returned at 13:30 and pt agreeable to tx session, no complaints of pain.  She required min A for squat pivot transfer to w/c. She self propelled w/c throughout unit for UE strengthening with supervision. Pt required max questioning cues to demonstration for proper set-up of w/c in prep for transfer.  She returned to supine on mat. Completed supine LE strengthening exercises as noted below:   Hell slides x10 R/L  Clam Shells x12 R/L  Glute squeezes x20 x3 sit>stand from seated EOM, mod A overall, tolerating ~10 seconds in standing before requiring seated rest break. Pt returned to room at end of session, left sitting up in w/c with all needs in reach.   Therapy Documentation Precautions:  Precautions Precautions: Fall, Back Required Braces or Orthoses: Spinal Brace Spinal Brace: Lumbar corset, Applied in sitting position Restrictions Weight Bearing Restrictions: No   Therapy/Group: Individual Therapy   Kulikowski L 06/10/2019, 7:03 AM

## 2019-06-10 NOTE — Progress Notes (Signed)
ANTICOAGULATION CONSULT NOTE - Follow Up Consult  Pharmacy Consult for Xarelto Indication: DVT  Allergies  Allergen Reactions  . Ceclor [Cefaclor] Hives and Itching    Patient Measurements: Height: 5\' 3"  (160 cm) Weight: 161 lb 10.4 oz (73.3 kg) IBW/kg (Calculated) : 52.4  Vital Signs: BP: 145/67 (07/20 0452) Pulse Rate: 76 (07/20 0454)  Labs: Recent Labs    06/09/19 0618  CREATININE 0.81    Estimated Creatinine Clearance: 53.2 mL/min (by C-G formula based on SCr of 0.81 mg/dL).  Assessment: Anticoag: new DVT. Start xarelto, cleared by Nsurg PA to start xarelto - 83 y/o, 73.3kg, Scr 0.81. Hgb 10.5 improved.  Goal of Therapy:  Therapeutic oral anticoagulation   Plan:  Xarelto 15mg  BID x 21 days, then 20mg  daily (7/28)- What is the LOT planned for Nitrofurantoin treatment?   Izak Anding S. Alford Highland, PharmD, [place] Clinical Staff Pharmacist Eilene Ghazi Stillinger 06/10/2019,1:07 PM

## 2019-06-11 ENCOUNTER — Inpatient Hospital Stay (HOSPITAL_COMMUNITY): Payer: Medicare Other | Admitting: Physical Therapy

## 2019-06-11 ENCOUNTER — Inpatient Hospital Stay (HOSPITAL_COMMUNITY): Payer: Medicare Other

## 2019-06-11 ENCOUNTER — Inpatient Hospital Stay (HOSPITAL_COMMUNITY): Payer: Medicare Other | Admitting: Occupational Therapy

## 2019-06-11 DIAGNOSIS — K567 Ileus, unspecified: Secondary | ICD-10-CM

## 2019-06-11 DIAGNOSIS — K9189 Other postprocedural complications and disorders of digestive system: Secondary | ICD-10-CM

## 2019-06-11 LAB — COMPREHENSIVE METABOLIC PANEL
ALT: 23 U/L (ref 0–44)
AST: 33 U/L (ref 15–41)
Albumin: 3 g/dL — ABNORMAL LOW (ref 3.5–5.0)
Alkaline Phosphatase: 131 U/L — ABNORMAL HIGH (ref 38–126)
Anion gap: 11 (ref 5–15)
BUN: 6 mg/dL — ABNORMAL LOW (ref 8–23)
CO2: 24 mmol/L (ref 22–32)
Calcium: 9.2 mg/dL (ref 8.9–10.3)
Chloride: 103 mmol/L (ref 98–111)
Creatinine, Ser: 0.7 mg/dL (ref 0.44–1.00)
GFR calc Af Amer: >60 mL/min (ref >60)
GFR calc non Af Amer: >60 mL/min (ref >60)
Glucose, Bld: 124 mg/dL — ABNORMAL HIGH (ref 70–99)
Potassium: 4 mmol/L (ref 3.5–5.1)
Sodium: 138 mmol/L (ref 135–145)
Total Bilirubin: 1 mg/dL (ref 0.3–1.2)
Total Protein: 6.5 g/dL (ref 6.5–8.1)

## 2019-06-11 LAB — CBC
HCT: 37.3 % (ref 36.0–46.0)
Hemoglobin: 12.1 g/dL (ref 12.0–15.0)
MCH: 31.8 pg (ref 26.0–34.0)
MCHC: 32.4 g/dL (ref 30.0–36.0)
MCV: 97.9 fL (ref 80.0–100.0)
Platelets: 507 10*3/uL — ABNORMAL HIGH (ref 150–400)
RBC: 3.81 MIL/uL — ABNORMAL LOW (ref 3.87–5.11)
RDW: 13.7 % (ref 11.5–15.5)
WBC: 10 10*3/uL (ref 4.0–10.5)
nRBC: 0 % (ref 0.0–0.2)

## 2019-06-11 LAB — GLUCOSE, CAPILLARY: Glucose-Capillary: 116 mg/dL — ABNORMAL HIGH (ref 70–99)

## 2019-06-11 MED ORDER — METOCLOPRAMIDE HCL 5 MG/ML IJ SOLN
10.0000 mg | Freq: Four times a day (QID) | INTRAMUSCULAR | Status: DC
  Administered 2019-06-11 – 2019-06-13 (×8): 10 mg via INTRAVENOUS
  Filled 2019-06-11 (×8): qty 2

## 2019-06-11 NOTE — Progress Notes (Signed)
Pt experiencing nausea and vomiting. Pt stated she had undigested lettuce that made her sick. Zofran 4mg  oral administered. Pt no longer complaining of feeling bad. Will continue to monitor.

## 2019-06-11 NOTE — Progress Notes (Signed)
Pt rang to front desk needing to use bathroom. Very weak and nauseas. VS 97.8 Oral, 82 P, 144/67, 14 R. Dan Angiulli notified, instructed to order KUB. Pt in bed call bell within reach, will continue to monitor.

## 2019-06-11 NOTE — Progress Notes (Signed)
Physical Therapy Session Note  Patient Details  Name: Tara [name] MRN: [id number] Date of Birth: [birth year]  Today's Date: 06/11/2019 PT Missed Time: 120 min Missed Time Reason: nausea/vomiting     Skilled Therapeutic Interventions/Progress Updates:  Attempted to see patient for scheduled therapy session at 0800 AM, pt reports having nausea/vomiting and declines participation in therapy session at that time. Returned to check on patient at 1045 AM and 1300 PM, pt remains nauseous and is unable to participate in therapy session. RN and medical team aware of symptoms. Pt missed 120 min of scheduled skilled therapy services this date 2/2 nausea and vomiting. Will follow up per plan of care.   Therapy Documentation Precautions:  Precautions Precautions: Fall, Back Required Braces or Orthoses: Spinal Brace Spinal Brace: Lumbar corset, Applied in sitting position Restrictions Weight Bearing Restrictions: No    Therapy/Group: Individual Therapy   Excell Seltzer, PT, DPT  06/11/2019, 7:51 AM

## 2019-06-11 NOTE — Patient Care Conference (Signed)
Inpatient RehabilitationTeam Conference and Plan of Care Update Date: 06/11/2019   Time: 2:15 PM    Patient Name: Tara [name] Record Number: [id number]  Date of Birth: 1936/01/21 Sex: Female         Room/Bed: 4W25C/4W25C-01 Payor Info: Payor: MEDICARE / Plan: MEDICARE PART A AND B / Product Type: *No Product type* /    Admitting Diagnosis: 1. SCI Team  Other Neuro, lumbar radiculopathy; 15-17days  Admit Date/Time:  05/28/2019 12:29 PM Admission Comments: No comment available   Primary Diagnosis:  <principal problem not specified> Principal Problem: <principal problem not specified>  Patient Active Problem List   Diagnosis Date Noted  . Anxiety state   . Acute blood loss anemia   . Lumbar radiculopathy 05/28/2019  . Postoperative pain   . Drug induced constipation   . History of TIA (transient ischemic attack)   . Lumbar spinal stenosis 05/23/2019  . HNP (herniated nucleus pulposus), lumbar 05/23/2019  . TIA (transient ischemic attack) 07/02/2018  . Chronic nonintractable headache 07/02/2018  . S/P total knee replacement 12/25/2017  . Ductal carcinoma in situ of right breast 08/17/2015  . Breast cancer of upper-outer quadrant of right female breast ([place]) 08/14/2015  . Right hip pain 10/27/2012  . Low back pain 10/27/2012  . Hypertension   . Osteoarthritis of right knee 11/30/2011  . Baker's cyst of knee 11/30/2011  . Dependent edema 06/19/2011  . Prediabetes 06/19/2011  . Urinary tract infection, recurrent 06/19/2011  . Urinary incontinence 06/19/2011  . Benign positional vertigo 06/19/2011  . Hypothyroidism 06/19/2011  . Tubular adenoma of colon 06/19/2011  . Essential hypertension, benign 04/15/2011    Expected Discharge Date: Expected Discharge Date: 06/19/19  Team Members Present: Physician leading conference: Dr. Alger Simons Social Worker Present: Lennart Pall, LCSW Nurse Present: Mohammed Kindle, RN PT Present: Excell Seltzer, PT OT Present: Amy  Rounds, OT SLP Present: Charolett Bumpers, SLP PPS Coordinator present : Gunnar Fusi, SLP     Current Status/Progress Goal Weekly Team Focus  Medical   left hip fracture, UTI. now with small ileus. pain controlled. mental status improved  normalize bowel function  see above   Bowel/Bladder   continent b/b. Q8 PVR. LBM 06/09/2019  Remain continent b/b  Assess toileeting needs qshift/prn   Swallow/Nutrition/ Hydration             ADL's   CGA- supervision squat pivot transfers; min-mod A standing with RW; Min A LB bathing/dressing; set-up UB bathing/dressing and grooming tasks from w/c level; supervision toileting  CGA overall  ADL re-training, Functional transfers; functional standing balance/endurance, functional activity tolerance   Mobility   S to CGA bed mobility, min A squat pivot transfer, min A sit to stand, gait up to 5' with RW and mod A, S w/c mobility  Supervision to min A overall  LE NMR, transfers, gait training   Communication             Safety/Cognition/ Behavioral Observations            Pain   no c/o pain  2/10  Assess pain qshift/prn, medicate as necessary   Skin   surgical incision mid lower back, steri strips in place & tefla dressing. No drainage no s/s of infection  skin remain intact and free of infection and breakdown  assess skin qshift/prn    Rehab Goals Patient on target to meet rehab goals: Yes *See Care Plan and progress notes for long and short-term  goals.     Barriers to Discharge  Current Status/Progress Possible Resolutions Date Resolved   Physician    Medical stability        see medical progress notes      Nursing                  PT  Decreased caregiver support                 OT                  SLP                SW                Discharge Planning/Teaching Needs:  [place] with family and friends to assist.  No confirmed plan of how she would arrange 24/7 support if this is what is recommended.   Teaching needs TBD   Team Discussion:  Now with small ileus and had SSE but minimal output;  UTI treated.  Still with some vertigo and nausea.  Min / mod with ADLs and min - supervision tfs.  Goals set for CGA at w/c level.  Min ist to stand and amb only a few steps in // bars.    Revisions to Treatment Plan:  NA    Continued Need for Acute Rehabilitation Level of Care: The patient requires daily medical management by a physician with specialized training in physical medicine and rehabilitation for the following conditions: Daily direction of a multidisciplinary physical rehabilitation program to ensure safe treatment while eliciting the highest outcome that is of practical value to the patient.: Yes Daily medical management of patient stability for increased activity during participation in an intensive rehabilitation regime.: Yes Daily analysis of laboratory values and/or radiology reports with any subsequent need for medication adjustment of medical intervention for : Wound care problems;Urological problems;Post surgical problems   I attest that I was present, lead the team conference, and concur with the assessment and plan of the team.   Lennart Pall 06/11/2019, 3:06 PM    Team conference was held via web/ teleconference due to [place] - 19
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
## 2019-06-11 NOTE — Plan of Care (Signed)
  Problem: Consults Goal: RH SPINAL CORD INJURY PATIENT EDUCATION Description:  See Patient Education module for education specifics.  Outcome: Progressing   Problem: SCI BOWEL ELIMINATION Goal: RH STG MANAGE BOWEL WITH ASSISTANCE Description: STG Manage Bowel with min Assistance. Outcome: Progressing   Problem: SCI BLADDER ELIMINATION Goal: RH STG MANAGE BLADDER WITH ASSISTANCE Description: STG Manage Bladder With Min Assistance Outcome: Progressing   Problem: RH SKIN INTEGRITY Goal: RH STG SKIN FREE OF INFECTION/BREAKDOWN Description: No new breakdown with mod I assist Outcome: Progressing Goal: RH STG ABLE TO PERFORM INCISION/WOUND CARE W/ASSISTANCE Description: STG Able To Perform Incision/Wound Care With [employer]. Outcome: Progressing   Problem: RH SAFETY Goal: RH STG ADHERE TO SAFETY PRECAUTIONS W/ASSISTANCE/DEVICE Description: STG Adhere to Safety Precautions With Mod I Assistance/Device. Outcome: Progressing   Problem: RH PAIN MANAGEMENT Goal: RH STG PAIN MANAGED AT OR BELOW PT'S PAIN GOAL Description: < 3 out of 10.  Outcome: Progressing

## 2019-06-11 NOTE — Progress Notes (Signed)
Occupational Therapy Weekly Progress Note  Patient Details  Name: Tara [name] MRN: [id number] Date of Birth: Apr 13, 1936  Beginning of progress report period: June 05, 2019 End of progress report period: June 11, 2019  Today's Date: 06/11/2019 OT Individual Time: 2376-2831 OT Individual Time Calculation (min): 25 min  OT Missed Time: 50 min (fatigue/ nausea)   Patient has met 2 of 3 short term goals.  Pt cont to make steady progress towards OT goals. She can be very inconsistent in performance, specifically in regards to fatigue levels. She can complete sit>stand with min-mod A to RW, depending on fatigue level can tolerate 5 seconds- ~45 seconds of standing at each trial.  She is using AE to perform bathing/dressing tasks in order to maintain spinal pre-cautions during functional tasks. She requires steadying assist overall for bathing/dressing for dynamic standing balance during pericare/buttock hygiene and during LB clothing management tasks.  Pt remains very motivated in therapy sessions and is open to modified/ w/c level ADLS/IADLs if that means increased independence at d/c.   Patient continues to demonstrate the following deficits: muscle weakness and muscle paralysis,decreased cardiorespiratoy endurance,ataxia and decreased coordinationand decreased sitting balance, decreased standing balance, decreased postural control, decreased balance strategies and difficulty maintaining precautions and therefore will continue to benefit from skilled OT intervention to enhance overall performance with BADL and Reduce care partner burden.   Patient progressing toward long term goals..  Continue plan of care.  OT Short Term Goals Week 2:  OT Short Term Goal 1 (Week 2): Pt will tolerate standing at least 30 seconds using LRAD in prep for functional standing task OT Short Term Goal 1 - Progress (Week 2): Partly met OT Short Term Goal 2 (Week 2): Pt will independently recall and follow 3/3  back pre-cautions independently during functional task OT Short Term Goal 2 - Progress (Week 2): Met OT Short Term Goal 3 (Week 2): Pt will consistently recall technique for squat pivot transfers independently OT Short Term Goal 3 - Progress (Week 2): Met Week 3:  OT Short Term Goal 1 (Week 3): STG=LTG due to LOS  Skilled Therapeutic Interventions/Progress Updates:    Therapist arrived at 10:00 for scheduled OT session. Pt asleep in supine. Easily awoken, however, with complaints of cont nausea and generalized weakness from rough night of nausea/ vomiting. Pt politely declining therapy at this time 2/2 fatigue and nausea. She reports RN is already aware of status and declined needing any intervention. Therapy will follow up as pt able.  Therapist returned at 11:28. RN present having just administered soap suds enema. RN requesting therapist to assist pt to [facility] if pt willing to facilitate upright toileting and to empty bowels. With encouragement, pt willing to attempt. She transferred to sitting EOB with supervision using hospital bed functions. She completed min A squat pivot transfer to drop arm BSC. Pt with urination only, unable to void bowels. Multi-modal cuing for technique to complete lateral leans for hygiene. Pt with increased complaints of weakness, fearful of being able to return to bed safely. +2 available for safety, pt completed min-mod squat pivot transfer back to bed and returned to supine. Min A bed mobility in order for new brief to be donned and positioning in bed. Pt left in supine with all needs in reach and bed alarm on.   Therapist returned for scheduled PM session. Pt sleeping soundly and not easily awoken. Pt left to cont to rest, all needs in reach and bed alarm on.   Therapy  Documentation Precautions:  Precautions Precautions: Fall, Back Required Braces or Orthoses: Spinal Brace Spinal Brace: Lumbar corset, Applied in sitting position Restrictions Weight Bearing  Restrictions: No   Therapy/Group: Individual Therapy  ,  L 06/11/2019, 7:13 AM

## 2019-06-11 NOTE — Progress Notes (Signed)
Pt has been fatigued and nauseous since the beginning of shift. Pt vomited after taking morning meds. Refused to get out of bed. Refused breakfast, lunch and dinner. Pt encouraged to take Xerelto this evening. IV Reglan and Zofran given with some relief.  Soap suds enema given around 1130. No results yet. Pt only expelled fluid inserted. Pt currently in bed resting. Continue plan of care.   Gerald Stabs, RN

## 2019-06-12 ENCOUNTER — Inpatient Hospital Stay (HOSPITAL_COMMUNITY): Payer: Medicare Other | Admitting: Occupational Therapy

## 2019-06-12 ENCOUNTER — Inpatient Hospital Stay (HOSPITAL_COMMUNITY): Payer: Medicare Other | Admitting: Physical Therapy

## 2019-06-12 MED ORDER — BISACODYL 10 MG RE SUPP
10.0000 mg | Freq: Once | RECTAL | Status: DC
  Filled 2019-06-12: qty 1

## 2019-06-12 NOTE — Progress Notes (Signed)
Occupational Therapy Session Note  Patient Details  Name: Tara [name] MRN: [id number] Date of Birth: November 20, 1936  Today's Date: 06/12/2019 OT Individual Time: 1045-1200 OT Individual Time Calculation (min): 75 min    Short Term Goals: Week 3:  OT Short Term Goal 1 (Week 3): STG=LTG due to LOS  Skilled Therapeutic Interventions/Progress Updates:    Pt seen for OT ADL bathing/dressing session. Pt asleep in supine upon arrival, easily awoken and agreeable to tx session. She requested to shower this session. She transferred to sitting EOB with supervision using hospital bed functions. She completed squat pivot transfers with guarding assist. Stand pivot transfer from w/c to tub transfer bench with use of grab bar and min A with VCs for sequencing/technique. She bathed seated on tub transfer bench with set-up/supervision. VCs for technique of lateral leans to complete buttock hygiene.  She returned to w/c to dress. UB dressing completed with set-up with assist to don lumbar corset. With encouragement, pt willing to use reacher to assist with threading LEs and education regarding dressing weaker leg first. She stood at sink with min-mod A for dynamic standing balance while pt pulled up pants.  Pt left seated in w/c at end of session completing grooming tasks with all needs in reach.   Therapy Documentation Precautions:  Precautions Precautions: Fall, Back Required Braces or Orthoses: Spinal Brace Spinal Brace: Lumbar corset, Applied in sitting position Restrictions Weight Bearing Restrictions: No Pain: Pain Assessment Pain Scale: 0-10 Pain Score: 0-No pain   Therapy/Group: Individual Therapy  Vivan Vanderveer L 06/12/2019, 12:36 PM

## 2019-06-12 NOTE — Progress Notes (Signed)
Social Work Patient ID: Tara [name], female   DOB: November 16, 1936, 83 y.o.   MRN: [id number]   Have reviewed team conference with pt, son and daughter (here today to visit).  All aware and agreeable with continued target of d/c date 7/29 and CGA w/c level goals overall.  Pt feeling MUCH better today and states, "I didn't know if I was gonna live yesterday."  Pleased with progress so far.  Continue to follow.  Ajah Vanhoose, LCSW

## 2019-06-12 NOTE — Progress Notes (Signed)
Occupational Therapy Session Note  Patient Details  Name: Tara [name] MRN: [id number] Date of Birth: 02-10-1936  Today's Date: 06/12/2019 OT Individual Time: 5521-7471 OT Individual Time Calculation (min): 45 min    Short Term Goals: Week 1:  OT Short Term Goal 1 (Week 1): Pt will consistently complete functional transfers with mod A using LRAD OT Short Term Goal 1 - Progress (Week 1): Met OT Short Term Goal 2 (Week 1): Pt will complete LB dressing with min A using AE PRN OT Short Term Goal 2 - Progress (Week 1): Met OT Short Term Goal 3 (Week 1): Pt will recall 3/3 back pre-cautions independently. OT Short Term Goal 3 - Progress (Week 1): Progressing toward goal OT Short Term Goal 4 (Week 1): Pt will bathe with min A using AE PRN OT Short Term Goal 4 - Progress (Week 1): Met Week 2:  OT Short Term Goal 1 (Week 2): Pt will tolerate standing at least 30 seconds using LRAD in prep for functional standing task OT Short Term Goal 1 - Progress (Week 2): Partly met OT Short Term Goal 2 (Week 2): Pt will independently recall and follow 3/3 back pre-cautions independently during functional task OT Short Term Goal 2 - Progress (Week 2): Progressing toward goal OT Short Term Goal 3 (Week 2): Pt will consistently recall technique for squat pivot transfers independently OT Short Term Goal 3 - Progress (Week 2): Met  Skilled Therapeutic Interventions/Progress Updates:    1:1 Pt donned pants with method of crossing LEs and was able to complete with min A. Sit to stands with mod cues for method with min A. Pt able to pull up pants in standing with min A for balance. Transitioned into the dayroom at the high low table. Pt stood and performed modified OTAGA exercises A with seated rest breaks in between exercises. Pt does still experience vertigo in standing. Pt able to propel w/c 120 feet back to her room. Left sitting up with Nt.   Therapy Documentation Precautions:  Precautions Precautions:  Fall, Back Required Braces or Orthoses: Spinal Brace Spinal Brace: Lumbar corset, Applied in sitting position Restrictions Weight Bearing Restrictions: No Pain:  no c/o pain; reporting she is feeling better today than yesterday.   Therapy/Group: Individual Therapy  Willeen Cass [facility] 06/12/2019, 3:53 PM

## 2019-06-12 NOTE — Progress Notes (Signed)
Physical Therapy Weekly Progress Note  Patient Details  Name: Tara [name] MRN: [id number] Date of Birth: [birth year]  Beginning of progress report period: June 05, 2019 End of progress report period: June 12, 2019  Today's Date: 06/12/2019 PT Individual Time: 1415-1530 PT Individual Time Calculation (min): 75 min   Patient has met 2 of 3 short term goals.  Pt continues to make slow but steady progress towards goals. She is currently at Supervision level for bed mobility, CGA for squat pivot transfers, min A to sit to stand, Supervision for w/c mobility, and is able to ambulate a few feet with RW with mod to max A. Pt is motivated to return to PLOF but is also understanding that she will likely not be ambulatory upon d/c home. Pt's standing tolerance remains limited by symptoms of vertigo and intermittent nausea.  Patient continues to demonstrate the following deficits muscle weakness, abnormal tone, unbalanced muscle activation and decreased coordination and decreased standing balance, decreased postural control and decreased balance strategies and therefore will continue to benefit from skilled PT intervention to increase functional independence with mobility.  Patient progressing toward long term goals..  Continue plan of care.  PT Short Term Goals Week 2:  PT Short Term Goal 1 (Week 2): Pt will complete bed to chair transfers with CGA consistently PT Short Term Goal 1 - Progress (Week 2): Met PT Short Term Goal 2 (Week 2): Pt will ambulate x 10 ft via least restrictive method PT Short Term Goal 2 - Progress (Week 2): Progressing toward goal PT Short Term Goal 3 (Week 2): Pt will complete bed mobility with Supervision PT Short Term Goal 3 - Progress (Week 2): Met Week 3:  PT Short Term Goal 1 (Week 3): =LTG due to ELOS  Skilled Therapeutic Interventions/Progress Updates:  Pt received supine in bed, agreeable to PT. No complaints of pain. Bed mobility Supervision. Pt is min A to don  LSO while seated EOB. Squat pivot transfer bed to w/c with CGA. Manual w/c propulsion x 150 ft with use of BUE and Supervision. Sit to stand in standing frame. Standing frame mini-squats 2 x 10 reps, L/R lateral weight shifts with manual cueing for weight shift and for upright posture. Standing tolerance 3 x 5 min with focus on maintaining equal WBing through BLE while playing card game. Pt exhibits improved tolerance for standing this date. Discussed d/c plan with patient and her home setup. Provided handout of home measurement sheet for patient's daughter to complete. Toilet transfer with min A, max A for clothing management. Pt left seated in w/c in room with daughter present at end of session.  Therapy Documentation Precautions:  Precautions Precautions: Fall, Back Required Braces or Orthoses: Spinal Brace Spinal Brace: Lumbar corset, Applied in sitting position Restrictions Weight Bearing Restrictions: No   Therapy/Group: Individual Therapy   Excell Seltzer, PT, DPT  06/12/2019, 4:57 PM
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
## 2019-06-12 NOTE — Plan of Care (Signed)
  Problem: Consults Goal: RH SPINAL CORD INJURY PATIENT EDUCATION Description:  See Patient Education module for education specifics.  Outcome: Progressing   Problem: SCI BOWEL ELIMINATION Goal: RH STG MANAGE BOWEL WITH ASSISTANCE Description: STG Manage Bowel with min Assistance. Outcome: Progressing   Problem: SCI BLADDER ELIMINATION Goal: RH STG MANAGE BLADDER WITH ASSISTANCE Description: STG Manage Bladder With Min Assistance Outcome: Progressing   Problem: RH SKIN INTEGRITY Goal: RH STG SKIN FREE OF INFECTION/BREAKDOWN Description: No new breakdown with mod I assist Outcome: Progressing Goal: RH STG ABLE TO PERFORM INCISION/WOUND CARE W/ASSISTANCE Description: STG Able To Perform Incision/Wound Care With [employer]. Outcome: Progressing   Problem: RH SAFETY Goal: RH STG ADHERE TO SAFETY PRECAUTIONS W/ASSISTANCE/DEVICE Description: STG Adhere to Safety Precautions With Mod I Assistance/Device. Outcome: Progressing   Problem: RH PAIN MANAGEMENT Goal: RH STG PAIN MANAGED AT OR BELOW PT'S PAIN GOAL Description: < 3 out of 10.  Outcome: Progressing

## 2019-06-13 ENCOUNTER — Inpatient Hospital Stay (HOSPITAL_COMMUNITY): Payer: Medicare Other | Admitting: Physical Therapy

## 2019-06-13 ENCOUNTER — Inpatient Hospital Stay (HOSPITAL_COMMUNITY): Payer: Medicare Other | Admitting: Occupational Therapy

## 2019-06-13 MED ORDER — METOCLOPRAMIDE HCL 5 MG PO TABS
10.0000 mg | ORAL_TABLET | Freq: Three times a day (TID) | ORAL | Status: DC
  Administered 2019-06-13 – 2019-06-14 (×3): 10 mg via ORAL
  Filled 2019-06-13 (×3): qty 2

## 2019-06-13 NOTE — Progress Notes (Signed)
Occupational Therapy Session Note  Patient Details  Name: Tara [name] MRN: [id number] Date of Birth: [birth year]  Today's Date: 06/13/2019 OT Individual Time: 1045-1130 OT Individual Time Calculation (min): 45 min    Short Term Goals: Week 1:  OT Short Term Goal 1 (Week 1): Pt will consistently complete functional transfers with mod A using LRAD OT Short Term Goal 1 - Progress (Week 1): Met OT Short Term Goal 2 (Week 1): Pt will complete LB dressing with min A using AE PRN OT Short Term Goal 2 - Progress (Week 1): Met OT Short Term Goal 3 (Week 1): Pt will recall 3/3 back pre-cautions independently. OT Short Term Goal 3 - Progress (Week 1): Progressing toward goal OT Short Term Goal 4 (Week 1): Pt will bathe with min A using AE PRN OT Short Term Goal 4 - Progress (Week 1): Met Week 2:  OT Short Term Goal 1 (Week 2): Pt will tolerate standing at least 30 seconds using LRAD in prep for functional standing task OT Short Term Goal 1 - Progress (Week 2): Partly met OT Short Term Goal 2 (Week 2): Pt will independently recall and follow 3/3 back pre-cautions independently during functional task OT Short Term Goal 2 - Progress (Week 2): Progressing toward goal OT Short Term Goal 3 (Week 2): Pt will consistently recall technique for squat pivot transfers independently OT Short Term Goal 3 - Progress (Week 2): Met  Week 3: STGs = LTGs  Skilled Therapeutic Interventions/Progress Updates:    Pt seen this session to work on sit to stand skills, w/c push ups, standing static balance. Pt did well with exercises, she was able to do fast and slow w/c pushups without difficulty.  Stood up with min A and worked on static standing with min A.   Instructed pt on how to don TED hose easily using the plastic bag method. She will still need A with this at Dc but she can explain to her caregivers. Pt resting in w/c with belt alarm on and all needs met.    Therapy Documentation Precautions:   Precautions Precautions: Fall, Back Required Braces or Orthoses: Spinal Brace Spinal Brace: Lumbar corset, Applied in sitting position Restrictions Weight Bearing Restrictions: No  Pain: Pain Assessment Pain Scale: 0-10 Pain Score: 0-No pain    Therapy/Group: Individual Therapy  [place] 06/13/2019, 12:37 PM

## 2019-06-13 NOTE — Plan of Care (Signed)
  Problem: Consults Goal: RH SPINAL CORD INJURY PATIENT EDUCATION Description:  See Patient Education module for education specifics.  Outcome: Progressing   Problem: SCI BOWEL ELIMINATION Goal: RH STG MANAGE BOWEL WITH ASSISTANCE Description: STG Manage Bowel with min Assistance. Outcome: Progressing   Problem: SCI BLADDER ELIMINATION Goal: RH STG MANAGE BLADDER WITH ASSISTANCE Description: STG Manage Bladder With Min Assistance Outcome: Progressing   Problem: RH SKIN INTEGRITY Goal: RH STG SKIN FREE OF INFECTION/BREAKDOWN Description: No new breakdown with mod I assist Outcome: Progressing Goal: RH STG ABLE TO PERFORM INCISION/WOUND CARE W/ASSISTANCE Description: STG Able To Perform Incision/Wound Care With [employer]. Outcome: Progressing   Problem: RH SAFETY Goal: RH STG ADHERE TO SAFETY PRECAUTIONS W/ASSISTANCE/DEVICE Description: STG Adhere to Safety Precautions With Mod I Assistance/Device. Outcome: Progressing   Problem: RH PAIN MANAGEMENT Goal: RH STG PAIN MANAGED AT OR BELOW PT'S PAIN GOAL Description: < 3 out of 10.  Outcome: Progressing

## 2019-06-13 NOTE — Progress Notes (Signed)
Physical Therapy Session Note  Patient Details  Name: Tara [name] MRN: [id number] Date of Birth: 1936-05-20  Today's Date: 06/13/2019 PT Individual [id number]; 2330-0762 PT Individual Time Calculation (min): 75 min and 75 min  Short Term Goals: Week 3:  PT Short Term Goal 1 (Week 3): =LTG due to ELOS  Skilled Therapeutic Interventions/Progress Updates:    Session 1: Pt received seated in w/c in room, agreeable to PT session. No complaints of pain. Pt is setup A to change her shirt while seated in w/c. Pt is able to perform w/c pushup for dependent donning/doffing of pants. Dependent to change socks and don shoes for time conservation. Manual w/c propulsion x 150 ft with use of BUE and Supervision. Reviewed management of w/c parts with min cueing. Car transfer via slide board x 2 reps with min A, cueing for safe transfer technique and head/hips relationship. Squat pivot transfer w/c to/from mat table with CGA. Sit to stand with BUE support on chair in front of patient with min A. Standing marches 3 x 10 reps with manual assist for R knee stability in stance, multimodal cueing for lateral weight shift. Pt initially has onset of dizziness in standing, resolves with seated rest break. Pt left seated in w/c in room with needs in reach at end of session.  Session 2: Pt received seated in bed, agreeable to PT session. No complaints of pain. Bed mobility Supervision. Squat pivot transfer bed to w/c with CGA. Toilet transfer with min A with use of grab bars. Pt is max A for clothing management, independent with pericare. Manual w/c propulsion x 150 ft with use of BUE and Supervision. Set pt up on LiteGait, min A to stand. Ambulation 2 x 10 ft in LiteGait with manual cueing through RLE for knee control in stance phase. Pt continues to demo fair ability to perform lateral weight shift and decreased step length with LLE due to poor weight shift to the R. Squat pivot transfer w/c to/from Nustep with min  A. Nustep level 6 x 8 min with use of B UE/LE for global strengthening. Squat pivot transfer w/c to bed with CGA. Sit to supine Supervision. Pt left semi-reclined in bed with needs in reach, bed alarm in place.  Therapy Documentation Precautions:  Precautions Precautions: Fall, Back Required Braces or Orthoses: Spinal Brace Spinal Brace: Lumbar corset, Applied in sitting position Restrictions Weight Bearing Restrictions: No    Therapy/Group: Individual Therapy   Excell Seltzer, PT, DPT  06/13/2019, 3:36 PM
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
## 2019-06-13 NOTE — Progress Notes (Signed)
  Patient ID: Tara [name], female   DOB: [birth year], 83 y.o.   MRN: [id number]     Diagnosis codes:  M51.16; M48.061  Height: 5'3"               Weight: 162 lbs           Patient suffers from spinal stenosis s/p L3-4 PLIF and L4-5 microdiscectomy which impairs their ability to perform daily activities like bathing, dressing and mobility in the home.  A walker will not resolve issue with performing activities of daily living.  A wheelchair will allow patient to safely perform daily activities.  Patient is not able to propel themselves in the home using a standard weight wheelchair due to severe weakness.  Patient can self propel in the lightweight wheelchair.   Reesa Chew, PA-C

## 2019-06-14 ENCOUNTER — Inpatient Hospital Stay (HOSPITAL_COMMUNITY): Payer: Medicare Other | Admitting: Physical Therapy

## 2019-06-14 ENCOUNTER — Inpatient Hospital Stay (HOSPITAL_COMMUNITY): Payer: Medicare Other

## 2019-06-14 ENCOUNTER — Inpatient Hospital Stay (HOSPITAL_COMMUNITY): Payer: Medicare Other | Admitting: Occupational Therapy

## 2019-06-14 MED ORDER — POLYETHYLENE GLYCOL 3350 17 G PO PACK
17.0000 g | PACK | Freq: Every day | ORAL | Status: DC
  Administered 2019-06-14 – 2019-06-19 (×6): 17 g via ORAL
  Filled 2019-06-14 (×7): qty 1

## 2019-06-14 MED ORDER — METOCLOPRAMIDE HCL 5 MG PO TABS
5.0000 mg | ORAL_TABLET | Freq: Three times a day (TID) | ORAL | Status: DC
  Administered 2019-06-14 – 2019-06-17 (×9): 5 mg via ORAL
  Filled 2019-06-14 (×9): qty 1

## 2019-06-14 MED ORDER — SENNOSIDES-DOCUSATE SODIUM 8.6-50 MG PO TABS
2.0000 | ORAL_TABLET | Freq: Every day | ORAL | Status: DC
  Administered 2019-06-14 – 2019-06-18 (×5): 2 via ORAL
  Filled 2019-06-14 (×5): qty 2

## 2019-06-14 NOTE — Progress Notes (Signed)
Physical Therapy Session Note  Patient Details  Name: Tara [name] MRN: [id number] Date of Birth: June 03, 1936  Today's Date: 06/14/2019 PT Individual Time: 1650-1730 PT Individual Time Calculation (min): 40 min   Short Term Goals: Week 3:  PT Short Term Goal 1 (Week 3): =LTG due to ELOS  Skilled Therapeutic Interventions/Progress Updates:   Pt received supine in bed and agreeable to PT. Supine>sit transfer with supervision assist and min cues for back precautions. PT donned LSO sitting EOB. Squat pivot transfer to [facility] with GCA for safety and cues for awareness of the RLE WC mobility with supervision assist 2 x 176f with min cues for turning technique and door way management. Kinetron reciprocal endurance training seated in WC, 30cm/sec, 4 x 2 min with prolonged rest break between bouts. Sit<>stand x 5 with CGA and min cues for posture, terminal knee extension and LE positioning. Pt returned to room and performed squat pivot transfer to bed with supervision assist. Sit>supine completed with supervision assist, and left supine in bed with call bell in reach and all needs met.           Therapy Documentation Precautions:  Precautions Precautions: Fall, Back Required Braces or Orthoses: Spinal Brace Spinal Brace: Lumbar corset, Applied in sitting position Restrictions Weight Bearing Restrictions: No General:   Vital Signs: Therapy Vitals Temp: (!) 97.4 F (36.3 C) Pulse Rate: 77 Resp: 16 BP: (!) 112/58 Patient Position (if appropriate): Lying Oxygen Therapy SpO2: 98 % O2 Device: Room Air Pain:   denies   Therapy/Group: Individual Therapy  ALorie Phenix7/24/2020, 5:30 PM
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
## 2019-06-14 NOTE — Progress Notes (Signed)
Physical Therapy Session Note  Patient Details  Name: Tara [name] MRN: [id number] Date of Birth: 09-21-1936  Today's Date: 06/14/2019 PT Individual Time: 2330-0762 PT Individual Time Calculation (min): 57 min   Short Term Goals: Week 3:  PT Short Term Goal 1 (Week 3): =LTG due to ELOS  Skilled Therapeutic Interventions/Progress Updates:    Pt received supine in bed and agreeable to therapy session. Supine>sit, HOB partially elevated, with supervision and increased time. Donned B LE shoes max assist and lumbar brace with max assist because pt has to use her hands to manage body habitus when donning brace. Squat pivot EOB>w/c with CGA for steadying and cuing for R LE placement as pt demonstrates poor awareness of positioning. Performed ~16ft B UE w/c propulsion with supervision and set-up assist - pt limited in distance by L hand discomfort/pain from arthritis Transported remainder of distance to therapy gym. Pt set-up for squat pivot transfer w/c>EOM with max cuing and significantly increased time for w/c part management. Squat pivot w/c>EOM with CGA and cuing for improved R LE foot placement prior to initiating transfer. Sit<>stands EOM<>RW with min assist/CGA for lifting and balance. Performed repeated L LE step up/down on 2" step with B UE support on RW focusing on R LE stance phase  NMR with min/mod assist and guarding R LE knee buckling. Ambulated 44ft, 1ft using RW with max assist of 1 for balance and guarding R LE knee buckling and +2 assist for w/c follow for safety - cuing for improved R LE foot placement in swing phase, increased hip/knee extension during stance, and improved AD management. Repeated sit<>stands w/c<>UE support on knees x6 with min/mod assist for lifting and cuing for increased anterior trunk weight shift and R LE knee extension upon coming to standing - attempted sit<>stand with L LE on 2" step to increased R LE weightbearing with pt able to perform 1x with max assist but  then unable to clear bottom from seat upon additional attempts. Transported back to room in w/c. Pt left sitting in w/c with needs in reach and seat belt alarm on.  Therapy Documentation Precautions:  Precautions Precautions: Fall, Back Required Braces or Orthoses: Spinal Brace Spinal Brace: Lumbar corset, Applied in sitting position Restrictions Weight Bearing Restrictions: No  Pain: Reports R knee pain level 3-4/10 with pt denying increase during activity - seated rest breaks provided for pain management throughout.   Therapy/Group: Individual Therapy  Tawana Scale, PT, DPT 06/14/2019, 7:54 AM

## 2019-06-14 NOTE — Progress Notes (Signed)
Occupational Therapy Session Note  Patient Details  Name: Tara [name] MRN: [id number] Date of Birth: [birth year]  Today's Date: 06/14/2019 OT Individual Time: 9579-0092 OT Individual Time Calculation (min): 45 min    Short Term Goals: Week 1:  OT Short Term Goal 1 (Week 1): Pt will consistently complete functional transfers with mod A using LRAD OT Short Term Goal 1 - Progress (Week 1): Met OT Short Term Goal 2 (Week 1): Pt will complete LB dressing with min A using AE PRN OT Short Term Goal 2 - Progress (Week 1): Met OT Short Term Goal 3 (Week 1): Pt will recall 3/3 back pre-cautions independently. OT Short Term Goal 3 - Progress (Week 1): Progressing toward goal OT Short Term Goal 4 (Week 1): Pt will bathe with min A using AE PRN OT Short Term Goal 4 - Progress (Week 1): Met Week 2:  OT Short Term Goal 1 (Week 2): Pt will tolerate standing at least 30 seconds using LRAD in prep for functional standing task OT Short Term Goal 1 - Progress (Week 2): Partly met OT Short Term Goal 2 (Week 2): Pt will independently recall and follow 3/3 back pre-cautions independently during functional task OT Short Term Goal 2 - Progress (Week 2): Progressing toward goal OT Short Term Goal 3 (Week 2): Pt will consistently recall technique for squat pivot transfers independently OT Short Term Goal 3 - Progress (Week 2): Met Week 3:  OT Short Term Goal 1 (Week 3): STG=LTG due to LOS  Skilled Therapeutic Interventions/Progress Updates:    1:1 Pt had already bathed with wet cloths her UB. Asked for A with LB. Pt transfer to the toilet with contact guard with grab bar. A to pull down pants. Pt able to perform hygiene for LB with setup with lateral leans. Discussed how at home she plans to wear "moo moos" without underwear for ease with managing clothing with toileting. Pt min A to pull back up pants in stnading position and contact guard to return to w/c,  Therapetuic activity: Engaged for 10 min on  resistance level 5 on the Nustep for 10 non step. Pt reported her BORG score was a 12. Pt then participated in ambulating with facilitation for right knee control and cues for posture and RW management. Ambulated ~10-12 feet with one seated rest break. W/c followed was done for safety as well.  Returned to room and setup with items.   Therapy Documentation Precautions:  Precautions Precautions: Fall, Back Required Braces or Orthoses: Spinal Brace Spinal Brace: Lumbar corset, Applied in sitting position Restrictions Weight Bearing Restrictions: No Pain:  no c/o pain    Therapy/Group: Individual Therapy  Willeen Cass [facility] 06/14/2019, 10:12 AM

## 2019-06-14 NOTE — Progress Notes (Signed)
Occupational Therapy Session Note  Patient Details  Name: Tara [name] MRN: [id number] Date of Birth: [birth year]  Today's Date: 06/14/2019 OT Individual Time: 1430-1500 OT Individual Time Calculation (min): 30 min    Short Term Goals: Week 1:  OT Short Term Goal 1 (Week 1): Pt will consistently complete functional transfers with mod A using LRAD OT Short Term Goal 1 - Progress (Week 1): Met OT Short Term Goal 2 (Week 1): Pt will complete LB dressing with min A using AE PRN OT Short Term Goal 2 - Progress (Week 1): Met OT Short Term Goal 3 (Week 1): Pt will recall 3/3 back pre-cautions independently. OT Short Term Goal 3 - Progress (Week 1): Progressing toward goal OT Short Term Goal 4 (Week 1): Pt will bathe with min A using AE PRN OT Short Term Goal 4 - Progress (Week 1): Met Week 2:  OT Short Term Goal 1 (Week 2): Pt will tolerate standing at least 30 seconds using LRAD in prep for functional standing task OT Short Term Goal 1 - Progress (Week 2): Partly met OT Short Term Goal 2 (Week 2): Pt will independently recall and follow 3/3 back pre-cautions independently during functional task OT Short Term Goal 2 - Progress (Week 2): Progressing toward goal OT Short Term Goal 3 (Week 2): Pt will consistently recall technique for squat pivot transfers independently OT Short Term Goal 3 - Progress (Week 2): Met  Skilled Therapeutic Interventions/Progress Updates:    1:1 Pt in bed when arrived. Discussed home setup and toileting. Pt sleeps in her love seat (adjustable back). Plan is to put a BSC next her and wear a night gown without underwear. Pt changed in gown in room and practiced transfers and clothing management from [facility]. Discussed regular BSC v drop arm. Pt able to perform with contact guard/ min A. Also discussed shower situation. Pt has a friend who is going to send a pic of a product that might work. I also suggested a tub bench and encouraged her to try it next session.   Changed  clothing back to pants and shirt with reacher with min A.   Therapy Documentation Precautions:  Precautions Precautions: Fall, Back Required Braces or Orthoses: Spinal Brace Spinal Brace: Lumbar corset, Applied in sitting position Restrictions Weight Bearing Restrictions: No Pain: No c/o pain  Therapy/Group: Individual Therapy  Willeen Cass [facility] 06/14/2019, 3:52 PM

## 2019-06-14 NOTE — Plan of Care (Signed)
  Problem: Consults Goal: RH SPINAL CORD INJURY PATIENT EDUCATION Description:  See Patient Education module for education specifics.  Outcome: Progressing   Problem: SCI BOWEL ELIMINATION Goal: RH STG MANAGE BOWEL WITH ASSISTANCE Description: STG Manage Bowel with min Assistance. Outcome: Progressing   Problem: SCI BLADDER ELIMINATION Goal: RH STG MANAGE BLADDER WITH ASSISTANCE Description: STG Manage Bladder With Min Assistance Outcome: Progressing   Problem: RH SKIN INTEGRITY Goal: RH STG SKIN FREE OF INFECTION/BREAKDOWN Description: No new breakdown with mod I assist Outcome: Progressing Goal: RH STG ABLE TO PERFORM INCISION/WOUND CARE W/ASSISTANCE Description: STG Able To Perform Incision/Wound Care With [employer]. Outcome: Progressing   Problem: RH SAFETY Goal: RH STG ADHERE TO SAFETY PRECAUTIONS W/ASSISTANCE/DEVICE Description: STG Adhere to Safety Precautions With Mod I Assistance/Device. Outcome: Progressing   Problem: RH PAIN MANAGEMENT Goal: RH STG PAIN MANAGED AT OR BELOW PT'S PAIN GOAL Description: < 3 out of 10.  Outcome: Progressing

## 2019-06-14 NOTE — Progress Notes (Signed)
Physical Therapy Session Note  Patient Details  Name: Tara [name] MRN: [id number] Date of Birth: [birth year]  Today's Date: 06/14/2019 PT Individual Time: 1300-1400 PT Individual Time Calculation (min): 60 min   Short Term Goals: Week 3:  PT Short Term Goal 1 (Week 3): =LTG due to ELOS  Skilled Therapeutic Interventions/Progress Updates:    Functional bed mobility using bed rails for support to come to EOB with supervision. Total assist to don LSO seated EOB. Squat pivot transfer to w/c with min assist and verbal cues for safe set up of w/c and attention to foot placement. Pt slightly impulsive at times with transfers. W/c propulsion for functional UE strengthening/endurance and mobility training modified independent in hallway. Kinetron in seated and standing positions for NMR to BLE with focus on activation and muscular coordination in RLE during full weightbearing. Performed about 3 reps each position until fatigue. Sit <> stands with LLE on 2" step for forced use of RLE during sit <> stands and for stance control training during dynamic balance activity to reach for and toss horsehoes - performed x 2 with seated rest break between sets. Requires cues for hand placement and facilitation for anterior weightshfit during sit <> stands with overall min assist. End of session performed min assist stand pivot transfer to toilet with grab bar for support and min assist for standing balance for clothing management. Returned back to bed with CGA for squat pivot transfer with again cues needed for safe set up of w/c and attention to positioning of feet prior to transfer. Requires min assist to return to supine and reposition. All needs in reach and NT arrived.   Therapy Documentation Precautions:  Precautions Precautions: Fall, Back Required Braces or Orthoses: Spinal Brace Spinal Brace: Lumbar corset, Applied in sitting position Restrictions Weight Bearing Restrictions: No Pain: Premedicated per  report for low back. Pt reports he returned to bed due to sitting in the chair too long.   Therapy/Group: Individual Therapy  Tara [name], PT, DPT, CBIS  06/14/2019, 3:06 PM

## 2019-06-15 ENCOUNTER — Inpatient Hospital Stay (HOSPITAL_COMMUNITY): Payer: Medicare Other | Admitting: Rehabilitation
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
## 2019-06-15 NOTE — Progress Notes (Signed)
Physical Therapy Session Note  Patient Details  Name: Tara [name] MRN: [id number] Date of Birth: 1936-10-14  Today's Date: 06/15/2019 PT Individual Time: 1032-1100 PT Individual Time Calculation (min): 28 min   Short Term Goals: Week 1:  PT Short Term Goal 1 (Week 1): Pt will perform bed<>WC transfers with mod assist consistently PT Short Term Goal 1 - Progress (Week 1): Met PT Short Term Goal 2 (Week 1): Pt will perform all bed mobility with min assist PT Short Term Goal 2 - Progress (Week 1): Met PT Short Term Goal 3 (Week 1): Pt will ambulate 35f with LRAD and mod assist PT Short Term Goal 3 - Progress (Week 1): Not met PT Short Term Goal 4 (Week 1): Pt will propell WC 1571fwith supervision assist PT Short Term Goal 4 - Progress (Week 1): Met Week 2:  PT Short Term Goal 1 (Week 2): Pt will complete bed to chair transfers with CGA consistently PT Short Term Goal 1 - Progress (Week 2): Met PT Short Term Goal 2 (Week 2): Pt will ambulate x 10 ft via least restrictive method PT Short Term Goal 2 - Progress (Week 2): Progressing toward goal PT Short Term Goal 3 (Week 2): Pt will complete bed mobility with Supervision PT Short Term Goal 3 - Progress (Week 2): Met  Skilled Therapeutic Interventions/Progress Updates:   Pt received lying in bed, agreeable to therapy.  Performed bed mobility with HOB elevated at mod I level.  [facility] prior to [facility].  Performed squat pivot transfer to w/c at S level with min cues for foot placement.  Pt self propelled to day room x 100' using BUEs/LEs.  Note that chair is slightly high but she is still able to propel with LEs to assist.  Once in day room, transferred to nustep as above, S level.  Performed x 5 mins with BUEs/LEs at level 4 resistance progressing to using LEs only x 3 more minutes with min cues for improved R hip abduction.  Note PT did have to make several adjustments to feet due to feet slipping out of  shoes.  Pt assisted back to room for time management.  Squat pivot back to bed at S to CGA level.  Sitting to supine at S level with use of bed rails.  All needs in reach, bed alarm set.   Therapy Documentation Precautions:  Precautions Precautions: Fall, Back Required Braces or Orthoses: Spinal Brace Spinal Brace: Lumbar corset, Applied in sitting position Restrictions Weight Bearing Restrictions: No   Pain: Pain Assessment Pain Scale: 0-10 Pain Score: 0-No pain    Therapy/Group: Individual Therapy  PaDenice Bors/25/2020, 10:52 AM

## 2019-06-15 NOTE — Plan of Care (Signed)
  Problem: SCI BOWEL ELIMINATION Goal: RH STG MANAGE BOWEL WITH ASSISTANCE Description: STG Manage Bowel with min Assistance. Outcome: Progressing   Problem: SCI BLADDER ELIMINATION Goal: RH STG MANAGE BLADDER WITH ASSISTANCE Description: STG Manage Bladder With Min Assistance Outcome: Progressing   Problem: RH SKIN INTEGRITY Goal: RH STG SKIN FREE OF INFECTION/BREAKDOWN Description: No new breakdown with mod I assist Outcome: Progressing Goal: RH STG ABLE TO PERFORM INCISION/WOUND CARE W/ASSISTANCE Description: STG Able To Perform Incision/Wound Care With [employer]. Outcome: Progressing   Problem: RH SAFETY Goal: RH STG ADHERE TO SAFETY PRECAUTIONS W/ASSISTANCE/DEVICE Description: STG Adhere to Safety Precautions With Mod I Assistance/Device. Outcome: Progressing   Problem: RH PAIN MANAGEMENT Goal: RH STG PAIN MANAGED AT OR BELOW PT'S PAIN GOAL Description: < 3 out of 10.  Outcome: Progressing

## 2019-06-16 ENCOUNTER — Inpatient Hospital Stay (HOSPITAL_COMMUNITY): Payer: Medicare Other | Admitting: Physical Therapy

## 2019-06-16 NOTE — Progress Notes (Signed)
Physical Therapy Session Note  Patient Details  Name: Tara [name] MRN: [id number] Date of Birth: [name] 03, 1936  Today's Date: 06/16/2019 PT Individual Time: 1603-1706 PT Individual Time Calculation (min): 63 min   Short Term Goals: Week 3:  PT Short Term Goal 1 (Week 3): =LTG due to ELOS  Skilled Therapeutic Interventions/Progress Updates:   Pt received sitting in w/c and agreeable to therapy session. Donned lumbar corset max/total assist. Pt questioning if she is performing her gaze stabilization exercises correctly. Performed each of them for 1 minute with min cuing for proper sequencing of eyes vs head movements including: horizontal and vertical X1 and horizontal and vertical X2. B UE w/c propulsion ~122ft independently. Transported remainder of distance to car room. Min cuing for w/c breaks during session with mod cuing and hand-over hand assist to don/doff leg rests and arm rests for transfers. Squat pivot transfer w/c<>car (sedan height) with CGA for steadying primarily on exiting the vehicle - pt able to perform B LE management without assist. Transported to ADL apartment. Performed household w/c mobility in ADL apartment including going over 2 thresholds and negotiating w/c in small spaces. Squat pivot transfer w/c<>couch (as pt reports she plans to sit in her reclining couch at home) with CGA going to lower surface of couch and min assist going to higher surface of w/c seat. Sit<>stand w/c<>RW with CGA for steadying and min cuing for proper hand placement for safety with AD. Standing marches with B UE support on RW to fatigue with CGA for steadying and 2x min assist for balance due to posterior lean. Transported back to room in w/c and pt left siting in w/c with needs in reach and seat belt alarm on.  Therapy Documentation Precautions:  Precautions Precautions: Fall, Back Required Braces or Orthoses: Spinal Brace Spinal Brace: Lumbar corset, Applied in sitting  position Restrictions Weight Bearing Restrictions: No  Pain:   No complaints of pain during session.    Therapy/Group: Individual Therapy  Tawana Scale, PT, DPT  06/16/2019, 5:09 PM
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
## 2019-06-16 NOTE — Progress Notes (Signed)
Physical Therapy Session Note  Patient Details  Name: Tara [name] MRN: [id number] Date of Birth: [birth year]  Today's Date: 06/16/2019 PT Individual Time: 1300-1343 PT Individual Time Calculation (min): 43 min   Short Term Goals: Week 1:  PT Short Term Goal 1 (Week 1): Pt will perform bed<>WC transfers with mod assist consistently PT Short Term Goal 1 - Progress (Week 1): Met PT Short Term Goal 2 (Week 1): Pt will perform all bed mobility with min assist PT Short Term Goal 2 - Progress (Week 1): Met PT Short Term Goal 3 (Week 1): Pt will ambulate 43f with LRAD and mod assist PT Short Term Goal 3 - Progress (Week 1): Not met PT Short Term Goal 4 (Week 1): Pt will propell WC 1591fwith supervision assist PT Short Term Goal 4 - Progress (Week 1): Met  Skilled Therapeutic Interventions/Progress Updates:  Pt was seen bedside in the pm. Pt transferred toilet to w/c with grab bar and c/g. Pt transported to rehab gym. Treatment focused on blocked practice of sit to stand with and without rolling walker for LE strengthening and technique 4 sets x 5 reps each. Pt propelled w/c back to room with B UEs Ily. Pt transferred w/c to edge of bed with min A and verbal cues. Doffed brace at edge of bed. Pt transferred edge of bed to supine with S. Pt left sitting up in bed with bed alarm on and call bell within reach.   Therapy Documentation Precautions:  Precautions Precautions: Fall, Back Required Braces or Orthoses: Spinal Brace Spinal Brace: Lumbar corset, Applied in sitting position Restrictions Weight Bearing Restrictions: No General:   Pain: No c/o pain.   Therapy/Group: Individual Therapy  MiDub Amis/26/2020, 3:32 PM

## 2019-06-17 ENCOUNTER — Ambulatory Visit (HOSPITAL_COMMUNITY): Payer: Medicare Other | Admitting: Physical Therapy

## 2019-06-17 ENCOUNTER — Encounter (HOSPITAL_COMMUNITY): Payer: Medicare Other | Admitting: Occupational Therapy

## 2019-06-17 ENCOUNTER — Inpatient Hospital Stay (HOSPITAL_COMMUNITY): Payer: Medicare Other | Admitting: Occupational Therapy

## 2019-06-17 LAB — BASIC METABOLIC PANEL
Anion gap: 9 (ref 5–15)
BUN: 5 mg/dL — ABNORMAL LOW (ref 8–23)
CO2: 25 mmol/L (ref 22–32)
Calcium: 9.1 mg/dL (ref 8.9–10.3)
Chloride: 104 mmol/L (ref 98–111)
Creatinine, Ser: 0.74 mg/dL (ref 0.44–1.00)
GFR calc Af Amer: >60 mL/min (ref >60)
GFR calc non Af Amer: >60 mL/min (ref >60)
Glucose, Bld: 117 mg/dL — ABNORMAL HIGH (ref 70–99)
Potassium: 3.7 mmol/L (ref 3.5–5.1)
Sodium: 138 mmol/L (ref 135–145)

## 2019-06-17 LAB — CBC
HCT: 38.5 % (ref 36.0–46.0)
Hemoglobin: 12.4 g/dL (ref 12.0–15.0)
MCH: 32.2 pg (ref 26.0–34.0)
MCHC: 32.2 g/dL (ref 30.0–36.0)
MCV: 100 fL (ref 80.0–100.0)
Platelets: 414 10*3/uL — ABNORMAL HIGH (ref 150–400)
RBC: 3.85 MIL/uL — ABNORMAL LOW (ref 3.87–5.11)
RDW: 13.5 % (ref 11.5–15.5)
WBC: 7.8 10*3/uL (ref 4.0–10.5)
nRBC: 0 % (ref 0.0–0.2)
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
## 2019-06-17 NOTE — Progress Notes (Signed)
Occupational Therapy Session Note  Patient Details  Name: Tara [name] MRN: [id number] Date of Birth: Apr 27, 1936  Today's Date: 06/17/2019 OT Individual Time: 0076-2263 and 1400-1500 OT Individual Time Calculation (min): 72 min and 60 min   Short Term Goals: Week 3:  OT Short Term Goal 1 (Week 3): STG=LTG due to LOS  Skilled Therapeutic Interventions/Progress Updates:    Session One: Pt seen for OT ADL bathing/dressing session. Pt seated on toilet upon arrival, having pushed call bell and ready to get off. Throughout session, she completed stand pivot transfers with use of grab bar and CGA.  She bathed seated on tub transfer bench with overall set-up/supervision, CGA when standing at grab bar to complete pericare/buttock hygiene. VCs for adhering to back pre-cautions during functional tasks.  She returned to w/c to dress. Completed grooming tasks from w/c level at sink mod I. She dressed UB with assist for back brace only. She used reacher to thread on pants with slightly increased time. She stood at [facility] with close supervision/guarding assist to pull pants up. Noted limited WBing through R LE during standing task. Total A for donning TED hose and she slid on shoes.  Pt left seated in w/c at end of session, set-up with breakfast tray and all needs in reach.  Education provided throughout session regarding back pre-cautions, DME, OT/PT goals, continuum of care, and d/c planning.     Session Two: Pt seen for OT family education session with pt's daughter and son. Pt received with hand off from PT, pt's daughter and son present for scheduled hands on family ed session.  In ADL apartment, discussed home bathroom set-up and DME. Pt completed simulated shower stall transfer utilizing tub transfer bench to cross threshold for increased safety and independence with task. Transfer completed with CGA with VCs for technique and assist to stabilize equipment. Education provided regarding reason for  recommendation, activity progression and DME.  Returned to pt's room. She required min cuing to correctly set-up w/c in prep for squat pivot transfer to EOB. Completed squat pivot transfer w/c<> EOB, EOB<> drop arm BSC with close supervision. Recommendation for use of BSC at bedside at night as well as using BSC during the day due to need to set-up for squat pivot transfer which pt's home bathroom does not allow for.  Pt returned to supine at end of session, bed alarm on and all needs in reach. Family voices understanding with all recommendations. They report hired care will assist with ADLs at d/c, however, voice understanding with method and recommendations for d/c.   Therapy Documentation Precautions:  Precautions Precautions: Fall, Back Required Braces or Orthoses: Spinal Brace Spinal Brace: Lumbar corset, Applied in sitting position Restrictions Weight Bearing Restrictions: No   Therapy/Group: Individual Therapy  Zared Knoth L 06/17/2019, 6:58 AM

## 2019-06-17 NOTE — Progress Notes (Signed)
Physical Therapy Session Note  Patient Details  Name: Tara [name] MRN: [id number] Date of Birth: [birth year]  Today's Date: 06/17/2019 PT Individual Time: 1300-1400 PT Individual Time Calculation (min): 60 min   Short Term Goals: Week 3:  PT Short Term Goal 1 (Week 3): =LTG due to ELOS  Skilled Therapeutic Interventions/Progress Updates:    Pt received seated in w/c in room with daughter and son present for hands-on family education session. No complaints of pain. Reviewed how to assist pt with donning/doffing of LSO. Due to body habitus pt requires assist with donning brace. Reviewed how to manage w/c parts and set up chair for safe transfer w/c to/from bed. Pt is able to perform squat pivot transfer w/c to/from bed with CGA. Performed demonstration of how to assist pt with transfer then had patient's daughter and son perform return demo. Manual w/c propulsion x 150 ft with use of BUE at mod I level. Car transfer via slide board and squat pivot with CGA. Performed demonstration of how to assist pt with transfer then had pt's daughter perform return demo. Pt's daughter and son demo good understanding of and ability to assist pt with transfers. Sit to stand with CGA to RW to demonstrate pt's ability to stand for some functional tasks such as pericare and clothing management. Education with patient and her family that pt is only to perform gait with therapy as it is not safe to ambulate with family at this time. Pt and her family agreeable. Pt handed off to OT for next family training session.  Therapy Documentation Precautions:  Precautions Precautions: Fall, Back Required Braces or Orthoses: Spinal Brace Spinal Brace: Lumbar corset, Applied in sitting position Restrictions Weight Bearing Restrictions: No    Therapy/Group: Individual Therapy   Excell Seltzer, PT, DPT  06/17/2019, 4:14 PM

## 2019-06-18 ENCOUNTER — Inpatient Hospital Stay (HOSPITAL_COMMUNITY): Payer: Medicare Other | Admitting: Occupational Therapy

## 2019-06-18 ENCOUNTER — Ambulatory Visit (HOSPITAL_COMMUNITY): Payer: Medicare Other | Admitting: *Deleted

## 2019-06-18 MED ORDER — GABAPENTIN 100 MG PO CAPS: 100.0000 mg | ORAL_CAPSULE | Freq: Two times a day (BID) | ORAL | 1 refills | Status: DC

## 2019-06-18 MED ORDER — LEVOTHYROXINE SODIUM 75 MCG PO TABS: 75.0000 ug | ORAL_TABLET | Freq: Every day | ORAL | 0 refills | Status: DC

## 2019-06-18 MED ORDER — ATORVASTATIN CALCIUM 40 MG PO TABS: 40.0000 mg | ORAL_TABLET | Freq: Every day | ORAL | 3 refills | Status: DC

## 2019-06-18 MED ORDER — PANTOPRAZOLE SODIUM 40 MG PO TBEC: 40.0000 mg | DELAYED_RELEASE_TABLET | Freq: Every day | ORAL | 0 refills | Status: DC

## 2019-06-18 MED ORDER — AMLODIPINE BESYLATE 10 MG PO TABS: 10.0000 mg | ORAL_TABLET | Freq: Every day | ORAL | 1 refills | Status: DC

## 2019-06-18 MED ORDER — LOSARTAN POTASSIUM 100 MG PO TABS: 50.0000 mg | ORAL_TABLET | Freq: Every day | ORAL | 0 refills | Status: DC

## 2019-06-18 MED ORDER — CYCLOBENZAPRINE HCL 10 MG PO TABS: 10.0000 mg | ORAL_TABLET | Freq: Three times a day (TID) | ORAL | 0 refills | Status: DC | PRN

## 2019-06-18 MED ORDER — RIVAROXABAN 20 MG PO TABS: 20.0000 mg | ORAL_TABLET | Freq: Every day | ORAL | 1 refills | Status: DC

## 2019-06-18 MED ORDER — LOSARTAN POTASSIUM 50 MG PO TABS: 50.0000 mg | ORAL_TABLET | Freq: Every day | ORAL | 0 refills | Status: DC

## 2019-06-18 MED ORDER — POLYETHYLENE GLYCOL 3350 17 G PO PACK: 17.0000 g | PACK | Freq: Every day | ORAL | 0 refills | Status: DC

## 2019-06-18 MED ORDER — ACETAMINOPHEN 325 MG PO TABS: 650.0000 mg | ORAL_TABLET | ORAL | Status: DC | PRN

## 2019-06-18 MED ORDER — CHOLECALCIFEROL 50 MCG (2000 UT) PO CAPS: 2000.0000 [IU] | ORAL_CAPSULE | Freq: Every day | ORAL | 1 refills | Status: DC

## 2019-06-18 MED ORDER — ASPIRIN 325 MG PO TABS: 325.0000 mg | ORAL_TABLET | Freq: Every day | ORAL | Status: DC

## 2019-06-18 MED ORDER — TRAMADOL HCL 50 MG PO TABS: 50.0000 mg | ORAL_TABLET | Freq: Four times a day (QID) | ORAL | 0 refills | Status: DC | PRN

## 2019-06-18 NOTE — Progress Notes (Signed)
Occupational Therapy Session Note  Patient Details  Name: Tara [name] MRN: [id number] Date of Birth: 1936/08/16  Today's Date: 06/18/2019 OT Individual Time: [phone] OT Individual Time Calculation (min): 89 min    Short Term Goals: Week 2:  OT Short Term Goal 1 (Week 2): Pt will tolerate standing at least 30 seconds using LRAD in prep for functional standing task OT Short Term Goal 1 - Progress (Week 2): Partly met OT Short Term Goal 2 (Week 2): Pt will independently recall and follow 3/3 back pre-cautions independently during functional task OT Short Term Goal 2 - Progress (Week 2): Progressing toward goal OT Short Term Goal 3 (Week 2): Pt will consistently recall technique for squat pivot transfers independently OT Short Term Goal 3 - Progress (Week 2): Met  Skilled Therapeutic Interventions/Progress Updates:    Patient seated in w/c and ready for pm therapy session.  She completed hair trim with set up.  Pivot transfers to/from shower bench and w/c with CGA. Bathing in shower with min a for feet and set up.  UB dressing set up.  Min a to donn LSO.  LB dressing with min A.  Oral care and grooming mod I at w/c level with increased time.  She is pleasant and cooperative, states that she feels prepared for discharge to home tomorrow with family and aide support.  She remained in the w/c seated at sink to finish applying make up and curling hair with call bell in reach.    Therapy Documentation Precautions:  Precautions Precautions: Fall, Back Required Braces or Orthoses: Spinal Brace Spinal Brace: Lumbar corset, Applied in sitting position Restrictions Weight Bearing Restrictions: No General:   Vital Signs: Therapy Vitals Temp: 98.8 F (37.1 C) Temp Source: Oral Pulse Rate: 84 Resp: 19 BP: 112/65 Patient Position (if appropriate): Sitting Oxygen Therapy SpO2: 98 % O2 Device: Room Air Pain: Pain Assessment Pain Scale: 0-10 Pain Score: 0-No pain   Other Treatments:      Therapy/Group: Individual Therapy  Carlos Levering 06/18/2019, 3:06 PM

## 2019-06-18 NOTE — Progress Notes (Signed)
Occupational Therapy Discharge Summary  Patient Details  Name: Tara [name] MRN: [id number] Date of Birth: 09-21-1936   Patient has met 85 of 11 long term goals due to improved activity tolerance, improved balance, postural control, ability to compensate for deficits and improved coordination.  Patient to discharge at [facility] Assist level.  Patient's care partner is independent to provide the necessary physical assistance at discharge.  Pt's family planning to hire paid caregivers to provide initial 24hr assist at d/c. Pt's daughter and son have both completed hands on family education and are able to provide the needed assist as well.  They are aware of and are in agreement with w/c level only at d/c. Pt is able to complete squat pivot transfers at guarding assist level. She can complete sit>stand with min A using RW for LB clothing management only. She is completing toileting tasks on drop arm BSC, lateral leans for clothing management/hygiene at mod I level.    Recommendation:  Patient will benefit from ongoing skilled OT services in home health setting to continue to advance functional skills in the area of BADL and Reduce care partner burden.  Equipment: Drop arm BSC and tub transfer bench  Reasons for discharge: treatment goals met and discharge from hospital  Patient/family agrees with progress made and goals achieved: Yes  OT Discharge Precautions/Restrictions  Precautions Precautions: Fall;Back Required Braces or Orthoses: Spinal Brace Spinal Brace: Lumbar corset;Applied in sitting position Restrictions Weight Bearing Restrictions: No Vision Baseline Vision/History: Wears glasses Wears Glasses: Reading only Patient Visual Report: No change from baseline Vision Assessment?: No apparent visual deficits Perception  Perception: Within Functional Limits Praxis Praxis: Intact Cognition Overall Cognitive Status: Within Functional Limits for tasks  assessed Arousal/Alertness: Awake/alert Orientation Level: Oriented X4 Attention: Focused Focused Attention: Appears intact Memory: Impaired Memory Impairment: Decreased recall of new information Awareness: Appears intact Problem Solving: Appears intact Comments: Pt reports some memory deficits at baseline, requires increased time and repetition for new learning/recall Sensation Sensation Light Touch: Impaired Detail Light Touch Impaired Details: Impaired RLE;Impaired LLE Proprioception: Impaired Detail Proprioception Impaired Details: Impaired RLE;Impaired LLE Coordination Gross Motor Movements are Fluid and Coordinated: No Fine Motor Movements are Fluid and Coordinated: Yes Coordination and Movement Description: B LE weakness Motor  Motor Motor: Ataxia Motor - Discharge Observations: B LE weakness R>L with sensory impairment Trunk/Postural Assessment  Cervical Assessment Cervical Assessment: Within Functional Limits Thoracic Assessment Thoracic Assessment: Exceptions to WFL(Back pre-cautions) Lumbar Assessment Lumbar Assessment: Exceptions to WFL(Back pre-cautions) Postural Control Postural Control: Deficits on evaluation  Balance Balance Balance Assessed: Yes Static Sitting Balance Static Sitting - Balance Support: Feet supported;No upper extremity supported Static Sitting - Level of Assistance: 7: Independent Static Sitting - Comment/# of Minutes: Sitting EOB Dynamic Sitting Balance Dynamic Sitting - Balance Support: During functional activity;Feet supported;No upper extremity supported Dynamic Sitting - Level of Assistance: 5: Stand by assistance Sitting balance - Comments: Sitting on tub bench to bathe Static Standing Balance Static Standing - Balance Support: During functional activity;Right upper extremity supported;Left upper extremity supported Static Standing - Level of Assistance: 5: Stand by assistance;4: Min assist Dynamic Standing Balance Dynamic  Standing - Balance Support: During functional activity;Right upper extremity supported;Left upper extremity supported Dynamic Standing - Level of Assistance: 4: Min assist Extremity/Trunk Assessment RUE Assessment RUE Assessment: Within Functional Limits LUE Assessment LUE Assessment: Within Functional Limits   Tara [name] L 06/18/2019, 12:47 PM

## 2019-06-18 NOTE — Progress Notes (Signed)
Occupational Therapy Session Note  Patient Details  Name: Tara [name] MRN: [id number] Date of Birth: December 23, 1935  Today's Date: 06/18/2019 OT Individual Time: 1100-1200 OT Individual Time Calculation (min): 60 min    Short Term Goals: Week 3:  OT Short Term Goal 1 (Week 3): STG=LTG due to LOS  Skilled Therapeutic Interventions/Progress Updates:    Pt seen for OT session focusing on functional transfers and AE training. Pt in supine upon arrival, denying pain and agreeable to tx session. Pt excited for d/c home tomorrow and feeling ready. She opted to complete bathing/dressing routine during PM OT session. She completed squat pivot transfer EOB>w/c with close supervision with VCs for technique. She self propelled w/c throughout unit with supervision. In ADL apartment, completed w/c<> low soft surface couch as pt has at home and is where she plans to sleep at d/c. Focus on pt being able to correctly set-up w/c and positioning in prep for transfer. Pt with difficulty carrying over technique/ set-up btwn transfers as she could do it bed>w/c, however, required up to mod cuing when completing same transfer w/c>couch. Completed x5 trials in total, ultimately being able to complete with supervision and no VCs required.  She was provided with shoe buttons, education/demonstration provided regarding use. Pt demonstrated ability to reach shoe to fasten button. Required VCs throughout session for maintaining of spinal pre-cautions during functional tasks. Pt returned to room at end of session, left seated in w/c with all needs in reach awaiting lunch tray.   Therapy Documentation Precautions:  Precautions Precautions: Fall, Back Required Braces or Orthoses: Spinal Brace Spinal Brace: Lumbar corset, Applied in sitting position Restrictions Weight Bearing Restrictions: No   Therapy/Group: Individual Therapy  [name] L 06/18/2019, 7:00 AM

## 2019-06-18 NOTE — Discharge Summary (Signed)
Physician Discharge Summary  Patient ID: Tara [name] MRN: [id number] DOB/AGE: 04-18-1936 83 y.o.  Admit date: 05/28/2019 Discharge date: 06/19/2019  Discharge Diagnoses:  Active Problems:   Lumbar radiculopathy   Acute blood loss anemia   Anxiety state Right acute popliteal DVT Pain management Hypertension Hyperlipidemia History of TIA Hypothyroidism Constipation E. coli UTI  Discharged Condition: Stable  Significant Diagnostic Studies: Dg Lumbar Spine 2-3 Views  Result Date: 05/23/2019 CLINICAL DATA:  Surgical posterior fusion of L3-4. EXAM: LUMBAR SPINE - 2-3 VIEW; DG C-ARM 61-120 MIN FLUOROSCOPY TIME:  44 seconds. COMPARISON:  Radiographs of May 20, 2019. FINDINGS: Two intraoperative fluoroscopic images of the lumbar spine demonstrate surgical posterior fusion of L3-4. Good alignment of vertebral bodies is noted. IMPRESSION: Fluoroscopic guidance provided during surgical posterior fusion of L3-4. Electronically Signed   By: Marijo Conception M.D.   On: 05/23/2019 19:21   Mr Lumbar Spine W Wo Contrast  Result Date: 05/20/2019 CLINICAL DATA:  Low back pain, bilateral leg pain, numbness in the left foot. Prior surgery in 2014. EXAM: MRI LUMBAR SPINE WITHOUT AND WITH CONTRAST TECHNIQUE: Multiplanar and multiecho pulse sequences of the lumbar spine were obtained without and with intravenous contrast. CONTRAST:  68mL MULTIHANCE GADOBENATE DIMEGLUMINE 529 MG/ML IV SOLN COMPARISON:  None. FINDINGS: Segmentation:  Standard. Alignment:  2 mm anterolisthesis of L2 on L3. Vertebrae: No fracture, evidence of discitis, or aggressive bone lesion. Marrow heterogeneity is present. Although this can be caused by marrow infiltrative processes, the most common causes include anemia, smoking, obesity, or advancing age. Conus medullaris and cauda equina: Conus extends to the L1 level. Conus and cauda equina appear normal. Paraspinal and other soft tissues: No acute paraspinal abnormality. Disc levels:  Disc spaces: Degenerative disease with disc height loss throughout the lumbar spine. T12-L1: Broad-based disc bulge. Mild bilateral facet arthropathy. 5 mm cystic structure along the right facet which may reflect a synovial cyst projecting into the right neural foramen versus a perineural cyst. Mild right foraminal stenosis. No left foraminal stenosis. Mild spinal stenosis. L1-L2: Broad-based disc bulge. Moderate bilateral facet arthropathy. Right lateral recess narrowing. Mild left foraminal narrowing. No right foraminal narrowing. No central canal stenosis. L2-L3: Mild broad-based disc bulge. Severe bilateral facet arthropathy. Moderate-severe spinal stenosis. Moderate right and mild left foraminal stenosis. L3-L4: Broad-based disc osteophyte complex with a right paracentral disc extrusion. Severe bilateral facet arthropathy. Severe spinal stenosis. Moderate right foraminal stenosis. Moderate left foraminal stenosis. L4-L5: Broad-based disc bulge with a left paracentral disc extrusion. Severe bilateral facet arthropathy. Severe left right lateral recess stenosis. Moderate-severe spinal stenosis. Moderate left foraminal stenosis. Mild right foraminal stenosis. L5-S1: Broad-based disc bulge. Moderate bilateral facet arthropathy. Moderate bilateral foraminal stenosis. No central canal stenosis. IMPRESSION: 1. Diffuse lumbar spine spondylosis as described above. 2. At L3-4 there is a broad-based disc osteophyte complex with a right paracentral disc extrusion. Severe bilateral facet arthropathy. Severe spinal stenosis. Moderate right foraminal stenosis. Moderate left foraminal stenosis. 3. At L4-5 there is a broad-based disc bulge with a left paracentral disc extrusion. Severe bilateral facet arthropathy. Severe left right lateral recess stenosis. Moderate-severe spinal stenosis. Moderate left foraminal stenosis. Mild right foraminal stenosis. 4. At T12-L1 there is a broad-based disc bulge. Mild bilateral facet  arthropathy. 5 mm cystic structure along the right facet which may reflect a synovial cyst projecting into the right neural foramen versus a perineural cyst. Mild right foraminal stenosis. No left foraminal stenosis. Mild spinal stenosis. Electronically Signed   By: Kathreen Devoid  On: 05/20/2019 11:37   Dg Abd Portable 1v  Result Date: 06/11/2019 CLINICAL DATA:  Nausea and vomiting. EXAM: PORTABLE ABDOMEN - 1 VIEW COMPARISON:  05/23/2019.  03/24/2017. FINDINGS: Surgical clips right upper quadrant. Distended loops of small bowel noted. Colon is nondistended. Stool in the colon. No free air. Pelvic calcifications consistent phleboliths. Degenerative changes lumbar spine and both hips. Lumbar spine scoliosis. L3-L4 fusion. IMPRESSION: Distended loops of small bowel noted. Small-bowel obstruction cannot be excluded. Electronically Signed   By: Marcello Moores  Register   On: 06/11/2019 07:49   Dg C-arm 1-60 Min  Result Date: 05/23/2019 CLINICAL DATA:  Surgical posterior fusion of L3-4. EXAM: LUMBAR SPINE - 2-3 VIEW; DG C-ARM 61-120 MIN FLUOROSCOPY TIME:  44 seconds. COMPARISON:  Radiographs of May 20, 2019. FINDINGS: Two intraoperative fluoroscopic images of the lumbar spine demonstrate surgical posterior fusion of L3-4. Good alignment of vertebral bodies is noted. IMPRESSION: Fluoroscopic guidance provided during surgical posterior fusion of L3-4. Electronically Signed   By: Marijo Conception M.D.   On: 05/23/2019 19:21   Vas [place] Lower Extremity Venous (dvt)  Result Date: 05/28/2019  Lower Venous Study Indications: Swelling.  Risk Factors: Surgery. Comparison Study: No prior studies. Performing Technologist: Oliver Hum RVT  Examination Guidelines: A complete evaluation includes B-mode imaging, spectral Doppler, color Doppler, and power Doppler as needed of all accessible portions of each vessel. Bilateral testing is considered an integral part of a complete examination. Limited examinations for reoccurring  indications may be performed as noted.  +---------+---------------+---------+-----------+----------+-------+ RIGHT    CompressibilityPhasicitySpontaneityPropertiesSummary +---------+---------------+---------+-----------+----------+-------+ CFV      Full           Yes      Yes                          +---------+---------------+---------+-----------+----------+-------+ SFJ      Full                                                 +---------+---------------+---------+-----------+----------+-------+ FV Prox  Full                                                 +---------+---------------+---------+-----------+----------+-------+ FV Mid   Full                                                 +---------+---------------+---------+-----------+----------+-------+ FV DistalFull                                                 +---------+---------------+---------+-----------+----------+-------+ PFV      Full                                                 +---------+---------------+---------+-----------+----------+-------+ POP      Partial  No       No                   Acute   +---------+---------------+---------+-----------+----------+-------+ PTV      Partial                                      Acute   +---------+---------------+---------+-----------+----------+-------+ PERO     Full                                                 +---------+---------------+---------+-----------+----------+-------+ Gastroc  Partial                                      Acute   +---------+---------------+---------+-----------+----------+-------+   +---------+---------------+---------+-----------+----------+-------+ LEFT     CompressibilityPhasicitySpontaneityPropertiesSummary +---------+---------------+---------+-----------+----------+-------+ CFV      Full           Yes      Yes                           +---------+---------------+---------+-----------+----------+-------+ SFJ      Full                                                 +---------+---------------+---------+-----------+----------+-------+ FV Prox  Full                                                 +---------+---------------+---------+-----------+----------+-------+ FV Mid   Full                                                 +---------+---------------+---------+-----------+----------+-------+ FV DistalFull                                                 +---------+---------------+---------+-----------+----------+-------+ PFV      Full                                                 +---------+---------------+---------+-----------+----------+-------+ POP      Full           Yes      Yes                          +---------+---------------+---------+-----------+----------+-------+ PTV      Full                                                 +---------+---------------+---------+-----------+----------+-------+  PERO     Partial                                      Acute   +---------+---------------+---------+-----------+----------+-------+ Gastroc  Partial                                      Acute   +---------+---------------+---------+-----------+----------+-------+     Summary: Right: Findings consistent with acute deep vein thrombosis involving the right popliteal vein, right posterior tibial veins, and right gastrocnemius veins. No cystic structure found in the popliteal fossa. Left: Findings consistent with acute deep vein thrombosis involving the left peroneal veins, and left gastrocnemius veins. No cystic structure found in the popliteal fossa.  *See table(s) above for measurements and observations. Electronically signed by Deitra Mayo MD on 05/28/2019 at 5:02:01 PM.    Final     Labs:  Basic Metabolic Panel: Recent Labs  Lab 06/17/19 0823  NA 138  K 3.7  CL 104  CO2 25   GLUCOSE 117*  BUN 5*  CREATININE 0.74  CALCIUM 9.1    CBC: Recent Labs  Lab 06/17/19 0823  WBC 7.8  HGB 12.4  HCT 38.5  MCV 100.0  PLT 414*    CBG: No results for input(s): GLUCAP in the last 168 hours. Family history.  Sister with breast cancer and CVA as well as uterine cancer.  Brother with kidney cancer.  Father with CHF.  Brother with lung cancer.  Denies any diabetes mellitus  Brief HPI:   Tara [name] is an 83 year old right-handed female history of hypertension, left TKA 2019, TIA maintained on aspirin, lumbar laminectomy with decompression 2014.  Lives alone independent living facility.  Used a walker and quad cane prior to admission.  Presented 05/23/2019 with low back pain radiating to left lower extremity.  Work-up and imaging revealed critical stenosis L3-4 with disc herniation and possible recurrence of synovial cyst with radiculopathy.  Underwent redo decompressive lumbar laminectomy with complete medial facetectomies and radical foraminotomies of the L4 and L3 nerve roots posterior lumbar interbody fusion 05/23/2019 per Dr. Saintclair Halsted.  Hospital course complicated by pain.  Routine back precautions with back brace when out of bed.  Subcutaneous heparin for DVT prophylaxis.  Patient was admitted for a comprehensive rehab program  Hospital Course: [place] was admitted to rehab 05/28/2019 for inpatient therapies to consist of PT, ST and OT at least three hours five days a week. Past admission physiatrist, therapy team and rehab RN have worked together to provide customized collaborative inpatient rehab.  Pertaining to Ms. Rowley's lumbar radiculopathy, HNP she had undergone decompressive lumbar laminectomy L3-4 interbody fusion 05/23/2019.  Back incision healing nicely she would follow-up neurosurgery.  Back brace when out of bed.  Routine follow-up venous Doppler studies revealed a right acute popliteal DVT and she was cleared to begin Xarelto per neurosurgery.  Patient with no  bleeding episodes.  Pain management use of Flexeril as needed tramadol for moderate pain oxycodone was discontinued at family's request due to questionable mental status changes.  She was on low-dose Neurontin.  Mood stabilization with Valium that she had been on for years as well as follow-up per neuropsychology.  Blood pressures controlled on Cozaar and Norvasc he would follow-up with her primary MD.  Hyperlipidemia Lipitor ongoing.  History of TIA continued  on low-dose aspirin.  Bouts of constipation emesis findings of mild small bowel ileus her diet was taken to clear liquids slowly advanced initially maintained on Reglan no further bouts of nausea vomiting her appetite continued to improve.  Hospital course rehab complicated by E. coli UTI completing a course of Macrobid denied any dysuria.  Physical exam.  Blood pressure 138/59 pulse 69 temperature 97.6 respirations 16 oxygen saturations 93% room air Constitutional.  Well-developed HEENT Head.  Normocephalic and atraumatic Eyes.  EOMs normal no discharge without nystagmus Respiratory effort normal no respiratory distress without wheeze GI.  Exhibits no distention nontender without rebound positive bowel sounds Cardiac regular rate rhythm not murmur or extra sounds or rubs Musculoskeletal mild lower extremity edema with palpable pedal pulses Neurological.  Alert follows commands bilateral upper extremities 5 out of 5 proximal distal.  Right lower extremity hip flexion knee extension 2 out of 5 ankle dorsiflexion 5 out of 5 left lower extremity hip flexion knee extension 3 out of 5 dorsiflexion 5 out of 5 sensation intact  Rehab course: During patient's stay in rehab weekly team conferences were held to monitor patient's progress, set goals and discuss barriers to discharge. At admission, patient required max assist for sit to stand, minimal guard side-lying to sitting.  Minimal assist upper body bathing max assist lower body bathing minimal assist  upper body dressing max assist lower body dressing moderate assist toilet transfers  She  has had improvement in activity tolerance, balance, postural control as well as ability to compensate for deficits. She has had improvement in functional use RUE/LUE  and RLE/LLE as well as improvement in awareness.  Patient able to perform squat pivot transfers wheelchair to and from bed with contact-guard.  Manual propulsion of wheelchair modified independent.  Sit to stand with contact-guard assist with rolling walker.  Perform demonstration of how to assist patient with transfer then had patient's daughter and son perform.  Car transfers with sliding board squat pivot contact-guard.  In regards to ambulation patient only to perform ambulation with therapy.  ADL she completed stand pivot transfers with the use of grab bar and contact-guard assist.  She bathes seated on tub bench with overall set up supervision contact-guard assist with standing a grab bar.  Completed grooming task from wheelchair level at sink modified independent.  Full family teaching completed plan discharge to home       Disposition: Discharge disposition: 01-Home or Self Care     Discharge to home   Diet: Regular  Special Instructions: No driving smoking or alcohol  Back brace when out of bed  Medications at discharge. 1 Tylenol as needed 2.  Norvasc 10 mg daily 3.  Aspirin 325 mg daily 4.  Lipitor 40 mg p.o. daily 5.  Vitamin D 2000 units daily 6.  Flexeril 10 mg 3 times daily as needed muscle spasms 7.  Valium 5 mg twice daily as needed anxiety 8.  Neurontin 100 mg p.o. twice daily 9.  Synthroid 75 mcg p.o. daily 10.  Cozaar 50 mg p.o. daily 11.  Protonix 40 mg nightly 12.  MiraLAX daily hold for loose stools 13.  Xarelto 20 mg daily 14.  Senokot 2 tablets nightly 15.  Tramadol 50 mg every 6 hours as needed moderate pain    Follow-up Information    Jamse Arn, MD Follow up.   Specialty: Physical  Medicine and Rehabilitation Why: Office to call for appointment Contact information: [address] [place] [address]  [phone]        Kary Kos, MD Follow up.   Specialty: Neurosurgery Why: Call for appointment 2 weeks Contact information: [address] [place] [address] [phone]           Signed: Cathlyn Parsons 06/19/2019, 5:23 AM

## 2019-06-18 NOTE — Plan of Care (Signed)
  Problem: Consults Goal: RH SPINAL CORD INJURY PATIENT EDUCATION Description:  See Patient Education module for education specifics.  Outcome: Progressing   Problem: SCI BOWEL ELIMINATION Goal: RH STG MANAGE BOWEL WITH ASSISTANCE Description: STG Manage Bowel with min Assistance. Outcome: Progressing   Problem: SCI BLADDER ELIMINATION Goal: RH STG MANAGE BLADDER WITH ASSISTANCE Description: STG Manage Bladder With Min Assistance Outcome: Progressing   Problem: RH SKIN INTEGRITY Goal: RH STG SKIN FREE OF INFECTION/BREAKDOWN Description: No new breakdown with mod I assist Outcome: Progressing Goal: RH STG ABLE TO PERFORM INCISION/WOUND CARE W/ASSISTANCE Description: STG Able To Perform Incision/Wound Care With [employer]. Outcome: Progressing   Problem: RH SAFETY Goal: RH STG ADHERE TO SAFETY PRECAUTIONS W/ASSISTANCE/DEVICE Description: STG Adhere to Safety Precautions With Mod I Assistance/Device. Outcome: Progressing   Problem: RH PAIN MANAGEMENT Goal: RH STG PAIN MANAGED AT OR BELOW PT'S PAIN GOAL Description: < 3 out of 10.  Outcome: Progressing

## 2019-06-18 NOTE — Patient Care Conference (Signed)
Inpatient RehabilitationTeam Conference and Plan of Care Update Date: 06/18/2019   Time: 2:00 PM    Patient Name: [place] Record Number: [id number]  Date of Birth: 04/29/1936 Sex: Female         Room/Bed: 4W25C/4W25C-01 Payor Info: Payor: MEDICARE / Plan: MEDICARE PART A AND B / Product Type: *No Product type* /    Admitting Diagnosis: 1. SCI Team  Other Neuro, lumbar radiculopathy; 15-17days  Admit Date/Time:  05/28/2019 12:29 PM Admission Comments: No comment available   Primary Diagnosis:  <principal problem not specified> Principal Problem: <principal problem not specified>  Patient Active Problem List   Diagnosis Date Noted  . Anxiety state   . Acute blood loss anemia   . Lumbar radiculopathy 05/28/2019  . Postoperative pain   . Drug induced constipation   . History of TIA (transient ischemic attack)   . Lumbar spinal stenosis 05/23/2019  . HNP (herniated nucleus pulposus), lumbar 05/23/2019  . TIA (transient ischemic attack) 07/02/2018  . Chronic nonintractable headache 07/02/2018  . S/P total knee replacement 12/25/2017  . Ductal carcinoma in situ of right breast 08/17/2015  . Breast cancer of upper-outer quadrant of right female breast ([place]) 08/14/2015  . Right hip pain 10/27/2012  . Low back pain 10/27/2012  . Hypertension   . Osteoarthritis of right knee 11/30/2011  . Baker's cyst of knee 11/30/2011  . Dependent edema 06/19/2011  . Prediabetes 06/19/2011  . Urinary tract infection, recurrent 06/19/2011  . Urinary incontinence 06/19/2011  . Benign positional vertigo 06/19/2011  . Hypothyroidism 06/19/2011  . Tubular adenoma of colon 06/19/2011  . Essential hypertension, benign 04/15/2011    Expected Discharge Date: Expected Discharge Date: 06/19/19  Team Members Present: Physician leading conference: Dr. Alger Simons Social Worker Present: Ovidio Kin, LCSW Nurse Present: Mohammed Kindle, RN PT Present: Excell Seltzer, PT OT Present:  Amy Rounds, OT SLP Present: Weston Anna, SLP PPS Coordinator present : Gunnar Fusi, Novella Olive, PT     Current Status/Progress Goal Weekly Team Focus  Medical   set for discharge. bowels moving, uti treated. pain controlled  finalize dc planning  see above   Bowel/Bladder   Continent of B&B; LBM 06/17/2019; q 8HR Bladder scan  Remain continent of B&B  Assess toileting Q shift and as needed   Swallow/Nutrition/ Hydration             ADL's   Supervision squat pivot transfers, min A stand pivot transfers with grab bar, supervision toileting; CGA LB bathing/dressing; mod I UB bathing/dressing and grooming tasks from w/c level  CGA overall  ADL re-training, d/c planning, family ed complete   Mobility   Supervision bed mobility, S to CGA squat pivot transfer, min A SPT with RW, gait 10' with RW and assist x 2, mod I w/c mobility  Supervision to min A overall  family ed prior to d/c home this week   Communication             Safety/Cognition/ Behavioral Observations            Pain   Denies pain, rates pain 0 out 10  Pain < or =2  Assess pain q shift and provide prn medication and relaxation techniques as ordered   Skin   Surgical incision to mid lower back; no s/s of infection, incision dry and intact  Remain free from infection, skin irritation, skin breakdown at surgical site  Assess and monitor skin q shift and prn      *  See Care Plan and progress notes for long and short-term goals.     Barriers to Discharge  Current Status/Progress Possible Resolutions Date Resolved   Physician                    Nursing                  PT                    OT                  SLP                SW                Discharge Planning/Teaching Needs:    Home with son and daughter who were here Monday for education and it went well. Aware of pt's care needs.     Team Discussion:  Medically stable for DC tomorrow. Bladder scans look good. Family education completed with  daughter and son -supervision-CGA goals met. Ready for DC tomorrow.   Revisions to Treatment Plan:  DC 7/29    Continued Need for Acute Rehabilitation Level of Care: The patient requires daily medical management by a physician with specialized training in physical medicine and rehabilitation for the following conditions: Daily direction of a multidisciplinary physical rehabilitation program to ensure safe treatment while eliciting the highest outcome that is of practical value to the patient.: Yes Daily medical management of patient stability for increased activity during participation in an intensive rehabilitation regime.: Yes Daily analysis of laboratory values and/or radiology reports with any subsequent need for medication adjustment of medical intervention for : Post surgical problems   I attest that I was present, lead the team conference, and concur with the assessment and plan of the team. Teleconference held due to COVID 19   Rishard Delange, Gardiner Rhyme 06/18/2019, 3:20 PM

## 2019-06-18 NOTE — Progress Notes (Signed)
Physical Therapy Session Note  Patient Details  Name: Tara [name] MRN: [id number] Date of Birth: 1936/06/18  Today's Date: 06/18/2019 PT Individual Time: 0900-1000 PT Individual Time Calculation (min): 60 min   Short Term Goals: Week 3:  PT Short Term Goal 1 (Week 3): =LTG due to ELOS  Skilled Therapeutic Interventions/Progress Updates:    Pt received seated in w/c in room, agreeable to PT session. No complaints of pain. First half of session cotreatment session with RT. Pt is setup A to wash pericare with cleaning cloths while seated in w/c, assist to wipe buttocks. Pt is able to doff her pants while seated I w/c with setup A while using reacher and is able to don pants with use of reacher and assist to pull pants up over buttocks. Dependent to don socks and shoes for time conservation. Manual w/c propulsion x 150 ft with use of BUE mod I. Pt is independent for management of w/c parts. Squat pivot transfer w/c to mat table with Supervision. Sit to stand with CGA to RW. Stand pivot transfer mat table to w/c with RW and min A with manual cueing for R knee control. Reviewed handout of HEP for patient with supine and seated BLE strengthening therex. Pt left seated in w/c in room with needs in reach at end of session.  Therapy Documentation Precautions:  Precautions Precautions: Fall, Back Required Braces or Orthoses: Spinal Brace Spinal Brace: Lumbar corset, Applied in sitting position Restrictions Weight Bearing Restrictions: No    Therapy/Group: Individual Therapy   Excell Seltzer, PT, DPT  06/18/2019, 3:44 PM

## 2019-06-18 NOTE — Progress Notes (Signed)
Physical Therapy Discharge Summary  Patient Details  Name: Tara [name] MRN: [id number] Date of Birth: [birth year]  Today's Date: 06/19/2019 PT Individual Time: 1030-1100 PT Individual Time Calculation (min): 30 min   Patient has met 8 of 8 long term goals due to improved activity tolerance, improved balance, improved postural control, increased strength and ability to compensate for deficits.  Patient to discharge at a wheelchair level Supervision.   Patient's care partner is independent to provide the necessary physical assistance at discharge.  Reasons goals not met: Pt has met all rehab goals.  Recommendation:  Patient will benefit from ongoing skilled PT services in home health setting to continue to advance safe functional mobility, address ongoing impairments in strength, balance, independence with functional mobility, safety, and minimize fall risk.  Equipment: RW, slide board, 18x18 manual w/c  Reasons for discharge: treatment goals met and discharge from hospital  Patient/family agrees with progress made and goals achieved: Yes   Skilled Intervention: Pt received seated in w/c in room ready to d/c home. Assisted pt with transport via w/c downstairs. Squat pivot transfer w/c to car with CGA. Pt left seated in car in care of daughter for safe d/c home this date.  PT Discharge Precautions/Restrictions Precautions Precautions: Fall;Back Required Braces or Orthoses: Spinal Brace Spinal Brace: Lumbar corset;Applied in sitting position Restrictions Weight Bearing Restrictions: No Pain Pain Assessment Pain Scale: 0-10 Pain Score: 0-No pain Vision/Perception  Perception Perception: Within Functional Limits Praxis Praxis: Intact  Cognition Overall Cognitive Status: Within Functional Limits for tasks assessed Arousal/Alertness: Awake/alert Orientation Level: Oriented X4 Attention: Focused Focused Attention: Appears intact Memory: Impaired Memory Impairment:  Decreased recall of new information Awareness: Appears intact Problem Solving: Appears intact Safety/Judgment: Appears intact Comments: Pt reports some memory deficits at baseline, requires increased time and repetition for new learning/recall Sensation Sensation Light Touch: Impaired Detail Peripheral sensation comments: mild neuropathy in Bil toes. Light Touch Impaired Details: Impaired RLE;Impaired LLE Proprioception: Impaired Detail Proprioception Impaired Details: Impaired RLE;Impaired LLE Coordination Gross Motor Movements are Fluid and Coordinated: No Fine Motor Movements are Fluid and Coordinated: Yes Coordination and Movement Description: B LE weakness Heel Shin Test: limited Bil R>L due to strength deficits. Motor  Motor Motor: Ataxia Motor - Skilled Clinical Observations: Bil LE weakness with mild sensory deficits. Motor - Discharge Observations: B LE weakness R>L with sensory impairment  Mobility Bed Mobility Bed Mobility: Rolling Right;Rolling Left;Supine to Sit;Sit to Supine Rolling Right: Supervision/verbal cueing Rolling Left: Supervision/Verbal cueing Supine to Sit: Supervision/Verbal cueing Sit to Supine: Supervision/Verbal cueing Transfers Transfers: Sit to Stand;Stand Pivot Transfers;Squat Pivot Transfers Sit to Stand: Contact Guard/Touching assist Stand Pivot Transfers: Minimal Assistance - Patient > 75% Stand Pivot Transfer Details: Verbal cues for sequencing;Verbal cues for technique;Verbal cues for precautions/safety;Verbal cues for safe use of DME/AE Squat Pivot Transfers: Supervision/Verbal cueing Transfer (Assistive device): Rolling walker Locomotion  Gait Ambulation: Yes Gait Assistance: 2 Helpers Gait Distance (Feet): 20 Feet Assistive device: Rolling walker Gait Assistance Details: Verbal cues for technique;Verbal cues for precautions/safety;Verbal cues for gait pattern;Verbal cues for safe use of DME/AE;Manual facilitation for weight  shifting Gait Assistance Details: manual cues for R quad control Gait Gait: Yes Gait Pattern: Impaired Gait Pattern: Right flexed knee in stance;Lateral hip instability;Wide base of support Gait velocity: decreased Stairs / Additional Locomotion Stairs: No Wheelchair Mobility Wheelchair Mobility: Yes Wheelchair Assistance: Independent with Camera operator: Both upper extremities Wheelchair Parts Management: Independent Distance: 150  Trunk/Postural Assessment  Cervical Assessment Cervical Assessment: Within Functional  Limits Thoracic Assessment Thoracic Assessment: Exceptions to WFL(back precautions) Lumbar Assessment Lumbar Assessment: Exceptions to WFL(back precautions) Postural Control Postural Control: Deficits on evaluation  Balance Balance Balance Assessed: Yes Static Sitting Balance Static Sitting - Balance Support: No upper extremity supported;Feet supported Static Sitting - Level of Assistance: 7: Independent Static Sitting - Comment/# of Minutes: Sitting EOB Dynamic Sitting Balance Dynamic Sitting - Balance Support: During functional activity;Feet supported;No upper extremity supported Dynamic Sitting - Level of Assistance: 5: Stand by assistance Sitting balance - Comments: Sitting on tub bench to bathe Static Standing Balance Static Standing - Balance Support: Bilateral upper extremity supported;During functional activity Static Standing - Level of Assistance: 5: Stand by assistance;4: Min assist Dynamic Standing Balance Dynamic Standing - Balance Support: Bilateral upper extremity supported;During functional activity Dynamic Standing - Level of Assistance: 4: Min assist;3: Mod assist Extremity Assessment  RUE Assessment RUE Assessment: Within Functional Limits LUE Assessment LUE Assessment: Within Functional Limits RLE Assessment RLE Assessment: Exceptions to [facility] General Strength Comments: improved since eval, see below RLE  Strength Right Hip Flexion: 3+/5 Right Knee Flexion: 3+/5 Right Knee Extension: 4+/5 Right Ankle Dorsiflexion: 4/5 LLE Assessment LLE Assessment: Exceptions to [facility] General Strength Comments: improved since eval, see below LLE Strength Left Hip Flexion: 5/5 Left Knee Flexion: 4/5 Left Knee Extension: 5/5 Left Ankle Dorsiflexion: 4/5     Excell Seltzer, PT, DPT 06/18/2019, 3:44 PM
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
## 2019-06-18 NOTE — Progress Notes (Signed)
Social Work Patient ID: Tara [name], female   DOB: Apr 14, 1936, 83 y.o.   MRN: [id number] Family here yesterday to go through education it went well. Have added tub bench and drop-arm bedside commode to the wheelchair and transfer board equipment order. Ready for discharge tomorrow.

## 2019-06-19 ENCOUNTER — Inpatient Hospital Stay (HOSPITAL_COMMUNITY): Payer: Medicare Other | Admitting: Physical Therapy
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
## 2019-06-19 NOTE — Discharge Instructions (Signed)
Inpatient Rehab Discharge Instructions  Tara [name] Discharge date and time: No discharge date for patient encounter.   Activities/Precautions/ Functional Status: Activity: brace when out of bed Diet: regular diet Wound Care: keep wound clean and dry Functional status:  ___ No restrictions     ___ Walk up steps independently ___ 24/7 supervision/assistance   ___ Walk up steps with assistance ___ Intermittent supervision/assistance  ___ Bathe/dress independently ___ Walk with walker     _x__ Bathe/dress with assistance ___ Walk Independently    ___ Shower independently ___ Walk with assistance    ___ Shower with assistance ___ No alcohol     ___ Return to work/school ________    COMMUNITY REFERRALS UPON DISCHARGE:    Home Health:   PT     OT                        Agency:  [facility]    Phone: [phone]    Medical Equipment/Items Ordered:  Wheelchair, cushion, drop arm commode, tub bench and transfer board                                                      Agency/Supplier: [facility] @ [phone]      Special Instructions: No driving,smoking or alcohol   My questions have been answered and I understand these instructions. I will adhere to these goals and the provided educational materials after my discharge from the hospital.  Patient/Caregiver Signature _______________________________ Date __________  Clinician Signature _______________________________________ Date __________  Please bring this form and your medication list with you to all your follow-up doctor's appointments.   Information on my medicine - XARELTO (rivaroxaban)   WHY WAS XARELTO PRESCRIBED FOR YOU? Xarelto was prescribed to treat blood clots that may have been found in the veins of your legs (deep vein thrombosis) or in your lungs (pulmonary embolism) and to reduce the risk of them occurring again.  What do you need to know about Xarelto? The starting dose is one 15 mg  tablet taken TWICE daily with food for the FIRST 21 DAYS then on 06/18/2019  the dose is changed to one 20 mg tablet taken ONCE A DAY with your evening meal.  DO NOT stop taking Xarelto without talking to the health care provider who prescribed the medication.  Refill your prescription for 20 mg tablets before you run out.  After discharge, you should have regular check-up appointments with your healthcare provider that is prescribing your Xarelto.  In the future your dose may need to be changed if your kidney function changes by a significant amount.  What do you do if you miss a dose? If you are taking Xarelto TWICE DAILY and you miss a dose, take it as soon as you remember. You may take two 15 mg tablets (total 30 mg) at the same time then resume your regularly scheduled 15 mg twice daily the next day.  If you are taking Xarelto ONCE DAILY and you miss a dose, take it as soon as you remember on the same day then continue your regularly scheduled once daily regimen the next day. Do not take two doses of Xarelto at the same time.   Important Safety Information Xarelto is a blood thinner medicine that can cause bleeding. You should  call your healthcare provider right away if you experience any of the following: ? Bleeding from an injury or your nose that does not stop. ? Unusual colored urine (red or dark brown) or unusual colored stools (red or black). ? Unusual bruising for unknown reasons. ? A serious fall or if you hit your head (even if there is no bleeding).  Some medicines may interact with Xarelto and might increase your risk of bleeding while on Xarelto. To help avoid this, consult your healthcare provider or pharmacist prior to using any new prescription or non-prescription medications, including herbals, vitamins, non-steroidal anti-inflammatory drugs (NSAIDs) and supplements.  This website has more information on Xarelto: [email or web address]

## 2019-06-19 NOTE — Plan of Care (Signed)
  Problem: Consults Goal: RH SPINAL CORD INJURY PATIENT EDUCATION Description:  See Patient Education module for education specifics.  Outcome: Completed/Met   Problem: SCI BOWEL ELIMINATION Goal: RH STG MANAGE BOWEL WITH ASSISTANCE Description: STG Manage Bowel with min Assistance. Outcome: Completed/Met   Problem: SCI BLADDER ELIMINATION Goal: RH STG MANAGE BLADDER WITH ASSISTANCE Description: STG Manage Bladder With Min Assistance Outcome: Completed/Met   Problem: RH SKIN INTEGRITY Goal: RH STG SKIN FREE OF INFECTION/BREAKDOWN Description: No new breakdown with mod I assist Outcome: Completed/Met Goal: RH STG ABLE TO PERFORM INCISION/WOUND CARE W/ASSISTANCE Description: STG Able To Perform Incision/Wound Care With [employer]. Outcome: Completed/Met   Problem: RH SAFETY Goal: RH STG ADHERE TO SAFETY PRECAUTIONS W/ASSISTANCE/DEVICE Description: STG Adhere to Safety Precautions With Mod I Assistance/Device. Outcome: Completed/Met   Problem: RH PAIN MANAGEMENT Goal: RH STG PAIN MANAGED AT OR BELOW PT'S PAIN GOAL Description: < 3 out of 10.  Outcome: Completed/Met

## 2019-06-19 NOTE — Progress Notes (Signed)
Recreational Therapy Session Note  Patient Details  Name: ROZANNA CORMANY MRN: [id number] Date of Birth: 04/14/1936 Today's Date: 06/19/2019 LATE ENTRY for 06/18/2019 Time:  900-930 Pain: no c/o Skilled Therapeutic Interventions/Progress Updates: Pt seen during co-treat with PT.  Pt up in w/c upon arrival requesting to complete LB dressing.  Pt requires setup A to wash peri area with cleaning cloths while seated in w/c, assist to wipe buttocks. Pt is able to doff her pants while seated w/c level with setup A while using reacher.  Pt dons pants with use of reacher and assist to pull pants up over buttocks. Dependent to don socks and shoes for time conservation. Pt propelled w/c ~150 ft using BUEs with mod I. Pt states she is so excited for discharge home tomorrow, "nothing can get me down today."  Pt completes simple TR seated w/c level with Mod I.   Therapy/Group: Co-Treatment  Bassam Dresch 06/19/2019, 8:53 AM

## 2019-06-19 NOTE — Progress Notes (Signed)
Recreational Therapy Discharge Summary Patient Details  Name: Tara [name] MRN: [id number] Date of Birth: Oct 30, 1936 Today's Date: 06/19/2019  Long term goals set: 1  Long term goals met: 1  Comments on progress toward goals: Pt has made good progress during LOS and is ready for discharge home today at overall Mod I w/c level for seated TR tasks.  Pt does require extra time to complete task.  TR session focused on identification of leisure interests/life satisfiers, activity analysis with potential modification for leisure participation.  Pt is thrilled to be going home and is planning to participate in previously enjoyed activities offered by her community.  Goal met.   Reasons for discharge: discharge from hospital  Patient/family agrees with progress made and goals achieved: Yes  Racquel Arkin 06/19/2019, 8:50 AM

## 2019-06-19 NOTE — Progress Notes (Signed)
Pt discharged home with family. Belongings and equipment sent with pt. discharge instructions given by Linna Hoff, [place]. No further questions. Gerald Stabs, RN

## 2019-06-21 ENCOUNTER — Telehealth: Payer: Self-pay | Admitting: *Deleted

## 2019-06-21 DIAGNOSIS — Z86010 Personal history of colonic polyps: Secondary | ICD-10-CM | POA: Diagnosis not present

## 2019-06-21 DIAGNOSIS — Z8673 Personal history of transient ischemic attack (TIA), and cerebral infarction without residual deficits: Secondary | ICD-10-CM | POA: Diagnosis not present

## 2019-06-21 DIAGNOSIS — Z4789 Encounter for other orthopedic aftercare: Secondary | ICD-10-CM | POA: Diagnosis not present

## 2019-06-21 DIAGNOSIS — Z87440 Personal history of urinary (tract) infections: Secondary | ICD-10-CM | POA: Diagnosis not present

## 2019-06-21 DIAGNOSIS — I10 Essential (primary) hypertension: Secondary | ICD-10-CM | POA: Diagnosis not present

## 2019-06-21 DIAGNOSIS — Z853 Personal history of malignant neoplasm of breast: Secondary | ICD-10-CM | POA: Diagnosis not present

## 2019-06-21 DIAGNOSIS — Z981 Arthrodesis status: Secondary | ICD-10-CM | POA: Diagnosis not present

## 2019-06-21 DIAGNOSIS — M48061 Spinal stenosis, lumbar region without neurogenic claudication: Secondary | ICD-10-CM | POA: Diagnosis not present

## 2019-06-21 DIAGNOSIS — E785 Hyperlipidemia, unspecified: Secondary | ICD-10-CM | POA: Diagnosis not present

## 2019-06-21 DIAGNOSIS — F329 Major depressive disorder, single episode, unspecified: Secondary | ICD-10-CM | POA: Diagnosis not present

## 2019-06-21 DIAGNOSIS — R7303 Prediabetes: Secondary | ICD-10-CM | POA: Diagnosis not present

## 2019-06-21 DIAGNOSIS — Z7901 Long term (current) use of anticoagulants: Secondary | ICD-10-CM | POA: Diagnosis not present

## 2019-06-21 DIAGNOSIS — F419 Anxiety disorder, unspecified: Secondary | ICD-10-CM | POA: Diagnosis not present

## 2019-06-21 DIAGNOSIS — E039 Hypothyroidism, unspecified: Secondary | ICD-10-CM | POA: Diagnosis not present

## 2019-06-21 DIAGNOSIS — G4700 Insomnia, unspecified: Secondary | ICD-10-CM | POA: Diagnosis not present

## 2019-06-21 DIAGNOSIS — I82431 Acute embolism and thrombosis of right popliteal vein: Secondary | ICD-10-CM | POA: Diagnosis not present

## 2019-06-21 DIAGNOSIS — Z6832 Body mass index (BMI) 32.0-32.9, adult: Secondary | ICD-10-CM | POA: Diagnosis not present

## 2019-06-21 DIAGNOSIS — E669 Obesity, unspecified: Secondary | ICD-10-CM | POA: Diagnosis not present

## 2019-06-21 NOTE — Telephone Encounter (Signed)
Transitional Care call-I spoke with Mrs Tara [name]    1. Are you/is patient experiencing any problems since coming home? Are there any questions regarding any aspect of care? NO She did comment that she was not given directions when she left the hospital about when she should start her medications and instructions as she had been given during previous hospitalizations discharge. I apologized to her for what she felt was shortcomings at her discharge even though we were not directly involved. She was appreciative of the call and the information I was giving her at this time. (see medication questions) 2. Are there any questions regarding medications administration/dosing? Are meds being taken as prescribed? Patient should review meds with caller to confirm Yes she has all medication but her Xarelto. She said when she went to get it the coupon could not be used and she has a deductible that was going to make her pay $455 to get it. She is on a full dose aspirin and she doesn't really know why she needs it. I told her I would find out and call her back.  After talking with Zella Ball and Algis Liming, she had blood clots in her legs and that is why she needs to be on the xarelto. Pam spoke with the pharmacy and there is no way around the deductible that must be met before she gets the medication.  Mrs Fabio agrees that she needs it after being told about the blood clots, and she will get the medication. She is going to call [facility] and discuss with them. 3. Have there been any falls? NO 4. Has Home Health been to the house and/or have they contacted you? If not, have you tried to contact them? Can we help you contact them? Yes. They are in the home currently. 5. Are bowels and bladder emptying properly? Are there any unexpected incontinence issues? If applicable, is patient following bowel/bladder programs? NO PROBLEMS 6. Any fevers, problems with breathing, unexpected pain? NO 7. Are there any skin problems or new areas  of breakdown? NO 8. Has the patient/family member arranged specialty MD follow up (ie cardiology/neurology/renal/surgical/etc)?  Can we help arrange? Appt given for her to come back to see Danella Sensing NP in our office and then will follow up with Dr Dagoberto Ligas in place of Dr Naaman Plummer. 9. Does the patient need any other services or support that we can help arrange? NO 10. Are caregivers following through as expected in assisting the patient? YES 11. Has the patient quit smoking, drinking alcohol, or using drugs as recommended? N/A  Appointment Wednesday 06/26/19 arrive @ 11:00 for 11:20 appt with Danella Sensing NP [place]

## 2019-06-21 NOTE — Progress Notes (Signed)
Social Work Discharge Note   The overall goal for the admission was met for:   Discharge location: [facility]  Length of Stay: Yes-22 DAYS  Discharge activity level: Yes-MIN ASSIST WHEELCHAIR LEVEL  Home/community participation: Yes  Services provided included: MD, RD, PT, OT, RN, CM, TR, Neuropsych and SW  Financial Services: Medicare and Private Insurance: BCBS  Follow-up services arranged: Home Health: [facility]-PT & OT, DME: [facility]-DROP-ARM BEDSIDE COMODE, WHEELCHAIR, TUB BENCH & 30 TRANSFER BOARD and Patient/Family has no preference for HH/DME agencies  Comments (or additional information):FAMILY CAME IN FOR EDUCATION AND AWARE WILL NEED 24 HR CARE AND ARE PREPARED TO PROVIDE THIS,SOME CONCERNS REGARDING PT'S READINESS TO [place] HER TO STAY ON CIR LONGER, BUT MD [place]  Patient/Family verbalized understanding of follow-up arrangements: Yes  Individual responsible for coordination of the follow-up plan:LYNN-SON AND [name]-DAUGHTER  Confirmed correct DME delivered: Elease Hashimoto 06/21/2019    Elease Hashimoto

## 2019-06-24 ENCOUNTER — Telehealth: Payer: Self-pay

## 2019-06-24 ENCOUNTER — Other Ambulatory Visit: Payer: Self-pay

## 2019-06-24 NOTE — Telephone Encounter (Signed)
Verdis Frederickson, PT from [facility] called reqeusting HHPT 1wk1, 2wk3, 1wk2. Orders approved and given.

## 2019-06-25 ENCOUNTER — Inpatient Hospital Stay (HOSPITAL_COMMUNITY): Payer: Medicare Other | Admitting: Rehabilitation

## 2019-06-25 DIAGNOSIS — E785 Hyperlipidemia, unspecified: Secondary | ICD-10-CM | POA: Diagnosis not present

## 2019-06-25 DIAGNOSIS — E039 Hypothyroidism, unspecified: Secondary | ICD-10-CM | POA: Diagnosis not present

## 2019-06-25 DIAGNOSIS — I82431 Acute embolism and thrombosis of right popliteal vein: Secondary | ICD-10-CM | POA: Diagnosis not present

## 2019-06-25 DIAGNOSIS — Z4789 Encounter for other orthopedic aftercare: Secondary | ICD-10-CM | POA: Diagnosis not present

## 2019-06-25 DIAGNOSIS — M48061 Spinal stenosis, lumbar region without neurogenic claudication: Secondary | ICD-10-CM | POA: Diagnosis not present

## 2019-06-25 DIAGNOSIS — I10 Essential (primary) hypertension: Secondary | ICD-10-CM | POA: Diagnosis not present

## 2019-06-26 ENCOUNTER — Encounter: Payer: Medicare Other | Admitting: Registered Nurse

## 2019-06-27 DIAGNOSIS — M48061 Spinal stenosis, lumbar region without neurogenic claudication: Secondary | ICD-10-CM | POA: Diagnosis not present

## 2019-06-27 DIAGNOSIS — Z4789 Encounter for other orthopedic aftercare: Secondary | ICD-10-CM | POA: Diagnosis not present

## 2019-06-27 DIAGNOSIS — I82431 Acute embolism and thrombosis of right popliteal vein: Secondary | ICD-10-CM | POA: Diagnosis not present

## 2019-06-27 DIAGNOSIS — E039 Hypothyroidism, unspecified: Secondary | ICD-10-CM | POA: Diagnosis not present

## 2019-06-27 DIAGNOSIS — I10 Essential (primary) hypertension: Secondary | ICD-10-CM | POA: Diagnosis not present

## 2019-06-27 DIAGNOSIS — E785 Hyperlipidemia, unspecified: Secondary | ICD-10-CM | POA: Diagnosis not present

## 2019-07-01 DIAGNOSIS — Z1331 Encounter for screening for depression: Secondary | ICD-10-CM | POA: Diagnosis not present

## 2019-07-01 DIAGNOSIS — M48061 Spinal stenosis, lumbar region without neurogenic claudication: Secondary | ICD-10-CM | POA: Diagnosis not present

## 2019-07-01 DIAGNOSIS — Z4789 Encounter for other orthopedic aftercare: Secondary | ICD-10-CM | POA: Diagnosis not present

## 2019-07-01 DIAGNOSIS — Z9181 History of falling: Secondary | ICD-10-CM | POA: Diagnosis not present

## 2019-07-01 DIAGNOSIS — I82431 Acute embolism and thrombosis of right popliteal vein: Secondary | ICD-10-CM | POA: Diagnosis not present

## 2019-07-01 DIAGNOSIS — M47816 Spondylosis without myelopathy or radiculopathy, lumbar region: Secondary | ICD-10-CM | POA: Diagnosis not present

## 2019-07-01 DIAGNOSIS — Z6832 Body mass index (BMI) 32.0-32.9, adult: Secondary | ICD-10-CM | POA: Diagnosis not present

## 2019-07-01 DIAGNOSIS — E785 Hyperlipidemia, unspecified: Secondary | ICD-10-CM | POA: Diagnosis not present

## 2019-07-01 DIAGNOSIS — I10 Essential (primary) hypertension: Secondary | ICD-10-CM | POA: Diagnosis not present

## 2019-07-01 DIAGNOSIS — E039 Hypothyroidism, unspecified: Secondary | ICD-10-CM | POA: Diagnosis not present

## 2019-07-02 ENCOUNTER — Encounter: Payer: Medicare Other | Admitting: Registered Nurse

## 2019-07-03 DIAGNOSIS — Z7901 Long term (current) use of anticoagulants: Secondary | ICD-10-CM

## 2019-07-03 DIAGNOSIS — E039 Hypothyroidism, unspecified: Secondary | ICD-10-CM | POA: Diagnosis not present

## 2019-07-03 DIAGNOSIS — I82431 Acute embolism and thrombosis of right popliteal vein: Secondary | ICD-10-CM | POA: Diagnosis not present

## 2019-07-03 DIAGNOSIS — Z4789 Encounter for other orthopedic aftercare: Secondary | ICD-10-CM | POA: Diagnosis not present

## 2019-07-03 DIAGNOSIS — Z87440 Personal history of urinary (tract) infections: Secondary | ICD-10-CM

## 2019-07-03 DIAGNOSIS — R7303 Prediabetes: Secondary | ICD-10-CM

## 2019-07-03 DIAGNOSIS — Z8673 Personal history of transient ischemic attack (TIA), and cerebral infarction without residual deficits: Secondary | ICD-10-CM | POA: Diagnosis not present

## 2019-07-03 DIAGNOSIS — Z86010 Personal history of colonic polyps: Secondary | ICD-10-CM

## 2019-07-03 DIAGNOSIS — E785 Hyperlipidemia, unspecified: Secondary | ICD-10-CM

## 2019-07-03 DIAGNOSIS — G4700 Insomnia, unspecified: Secondary | ICD-10-CM

## 2019-07-03 DIAGNOSIS — Z6832 Body mass index (BMI) 32.0-32.9, adult: Secondary | ICD-10-CM

## 2019-07-03 DIAGNOSIS — M48061 Spinal stenosis, lumbar region without neurogenic claudication: Secondary | ICD-10-CM | POA: Diagnosis not present

## 2019-07-03 DIAGNOSIS — Z981 Arthrodesis status: Secondary | ICD-10-CM

## 2019-07-03 DIAGNOSIS — I10 Essential (primary) hypertension: Secondary | ICD-10-CM | POA: Diagnosis not present

## 2019-07-03 DIAGNOSIS — Z853 Personal history of malignant neoplasm of breast: Secondary | ICD-10-CM

## 2019-07-03 DIAGNOSIS — F329 Major depressive disorder, single episode, unspecified: Secondary | ICD-10-CM | POA: Diagnosis not present

## 2019-07-03 DIAGNOSIS — F419 Anxiety disorder, unspecified: Secondary | ICD-10-CM | POA: Diagnosis not present

## 2019-07-03 DIAGNOSIS — E669 Obesity, unspecified: Secondary | ICD-10-CM | POA: Diagnosis not present

## 2019-07-04 DIAGNOSIS — I82431 Acute embolism and thrombosis of right popliteal vein: Secondary | ICD-10-CM | POA: Diagnosis not present

## 2019-07-04 DIAGNOSIS — I10 Essential (primary) hypertension: Secondary | ICD-10-CM | POA: Diagnosis not present

## 2019-07-04 DIAGNOSIS — E039 Hypothyroidism, unspecified: Secondary | ICD-10-CM | POA: Diagnosis not present

## 2019-07-04 DIAGNOSIS — Z4789 Encounter for other orthopedic aftercare: Secondary | ICD-10-CM | POA: Diagnosis not present

## 2019-07-04 DIAGNOSIS — E785 Hyperlipidemia, unspecified: Secondary | ICD-10-CM | POA: Diagnosis not present

## 2019-07-04 DIAGNOSIS — M48061 Spinal stenosis, lumbar region without neurogenic claudication: Secondary | ICD-10-CM | POA: Diagnosis not present

## 2019-07-08 DIAGNOSIS — E785 Hyperlipidemia, unspecified: Secondary | ICD-10-CM | POA: Diagnosis not present

## 2019-07-08 DIAGNOSIS — Z4789 Encounter for other orthopedic aftercare: Secondary | ICD-10-CM | POA: Diagnosis not present

## 2019-07-08 DIAGNOSIS — I10 Essential (primary) hypertension: Secondary | ICD-10-CM | POA: Diagnosis not present

## 2019-07-08 DIAGNOSIS — E039 Hypothyroidism, unspecified: Secondary | ICD-10-CM | POA: Diagnosis not present

## 2019-07-08 DIAGNOSIS — I82431 Acute embolism and thrombosis of right popliteal vein: Secondary | ICD-10-CM | POA: Diagnosis not present

## 2019-07-08 DIAGNOSIS — M48061 Spinal stenosis, lumbar region without neurogenic claudication: Secondary | ICD-10-CM | POA: Diagnosis not present

## 2019-07-09 DIAGNOSIS — I10 Essential (primary) hypertension: Secondary | ICD-10-CM | POA: Diagnosis not present

## 2019-07-09 DIAGNOSIS — Z4789 Encounter for other orthopedic aftercare: Secondary | ICD-10-CM | POA: Diagnosis not present

## 2019-07-09 DIAGNOSIS — I82431 Acute embolism and thrombosis of right popliteal vein: Secondary | ICD-10-CM | POA: Diagnosis not present

## 2019-07-09 DIAGNOSIS — M48061 Spinal stenosis, lumbar region without neurogenic claudication: Secondary | ICD-10-CM | POA: Diagnosis not present

## 2019-07-09 DIAGNOSIS — E039 Hypothyroidism, unspecified: Secondary | ICD-10-CM | POA: Diagnosis not present

## 2019-07-09 DIAGNOSIS — E785 Hyperlipidemia, unspecified: Secondary | ICD-10-CM | POA: Diagnosis not present

## 2019-07-11 DIAGNOSIS — E785 Hyperlipidemia, unspecified: Secondary | ICD-10-CM | POA: Diagnosis not present

## 2019-07-11 DIAGNOSIS — Z4789 Encounter for other orthopedic aftercare: Secondary | ICD-10-CM | POA: Diagnosis not present

## 2019-07-11 DIAGNOSIS — E039 Hypothyroidism, unspecified: Secondary | ICD-10-CM | POA: Diagnosis not present

## 2019-07-11 DIAGNOSIS — I82431 Acute embolism and thrombosis of right popliteal vein: Secondary | ICD-10-CM | POA: Diagnosis not present

## 2019-07-11 DIAGNOSIS — I10 Essential (primary) hypertension: Secondary | ICD-10-CM | POA: Diagnosis not present

## 2019-07-11 DIAGNOSIS — M48061 Spinal stenosis, lumbar region without neurogenic claudication: Secondary | ICD-10-CM | POA: Diagnosis not present

## 2019-07-17 DIAGNOSIS — M48061 Spinal stenosis, lumbar region without neurogenic claudication: Secondary | ICD-10-CM | POA: Diagnosis not present

## 2019-07-17 DIAGNOSIS — E039 Hypothyroidism, unspecified: Secondary | ICD-10-CM | POA: Diagnosis not present

## 2019-07-17 DIAGNOSIS — E785 Hyperlipidemia, unspecified: Secondary | ICD-10-CM | POA: Diagnosis not present

## 2019-07-17 DIAGNOSIS — Z4789 Encounter for other orthopedic aftercare: Secondary | ICD-10-CM | POA: Diagnosis not present

## 2019-07-17 DIAGNOSIS — I10 Essential (primary) hypertension: Secondary | ICD-10-CM | POA: Diagnosis not present

## 2019-07-17 DIAGNOSIS — I82431 Acute embolism and thrombosis of right popliteal vein: Secondary | ICD-10-CM | POA: Diagnosis not present

## 2019-07-19 DIAGNOSIS — I10 Essential (primary) hypertension: Secondary | ICD-10-CM | POA: Diagnosis not present

## 2019-07-19 DIAGNOSIS — I82431 Acute embolism and thrombosis of right popliteal vein: Secondary | ICD-10-CM | POA: Diagnosis not present

## 2019-07-19 DIAGNOSIS — E785 Hyperlipidemia, unspecified: Secondary | ICD-10-CM | POA: Diagnosis not present

## 2019-07-19 DIAGNOSIS — Z4789 Encounter for other orthopedic aftercare: Secondary | ICD-10-CM | POA: Diagnosis not present

## 2019-07-19 DIAGNOSIS — M48061 Spinal stenosis, lumbar region without neurogenic claudication: Secondary | ICD-10-CM | POA: Diagnosis not present

## 2019-07-19 DIAGNOSIS — E039 Hypothyroidism, unspecified: Secondary | ICD-10-CM | POA: Diagnosis not present

## 2019-07-21 DIAGNOSIS — Z981 Arthrodesis status: Secondary | ICD-10-CM | POA: Diagnosis not present

## 2019-07-21 DIAGNOSIS — I10 Essential (primary) hypertension: Secondary | ICD-10-CM | POA: Diagnosis not present

## 2019-07-21 DIAGNOSIS — Z6832 Body mass index (BMI) 32.0-32.9, adult: Secondary | ICD-10-CM | POA: Diagnosis not present

## 2019-07-21 DIAGNOSIS — E785 Hyperlipidemia, unspecified: Secondary | ICD-10-CM | POA: Diagnosis not present

## 2019-07-21 DIAGNOSIS — Z87440 Personal history of urinary (tract) infections: Secondary | ICD-10-CM | POA: Diagnosis not present

## 2019-07-21 DIAGNOSIS — I82431 Acute embolism and thrombosis of right popliteal vein: Secondary | ICD-10-CM | POA: Diagnosis not present

## 2019-07-21 DIAGNOSIS — E039 Hypothyroidism, unspecified: Secondary | ICD-10-CM | POA: Diagnosis not present

## 2019-07-21 DIAGNOSIS — Z86010 Personal history of colonic polyps: Secondary | ICD-10-CM | POA: Diagnosis not present

## 2019-07-21 DIAGNOSIS — R7303 Prediabetes: Secondary | ICD-10-CM | POA: Diagnosis not present

## 2019-07-21 DIAGNOSIS — G4700 Insomnia, unspecified: Secondary | ICD-10-CM | POA: Diagnosis not present

## 2019-07-21 DIAGNOSIS — E669 Obesity, unspecified: Secondary | ICD-10-CM | POA: Diagnosis not present

## 2019-07-21 DIAGNOSIS — F419 Anxiety disorder, unspecified: Secondary | ICD-10-CM | POA: Diagnosis not present

## 2019-07-21 DIAGNOSIS — Z7901 Long term (current) use of anticoagulants: Secondary | ICD-10-CM | POA: Diagnosis not present

## 2019-07-21 DIAGNOSIS — M48061 Spinal stenosis, lumbar region without neurogenic claudication: Secondary | ICD-10-CM | POA: Diagnosis not present

## 2019-07-21 DIAGNOSIS — Z4789 Encounter for other orthopedic aftercare: Secondary | ICD-10-CM | POA: Diagnosis not present

## 2019-07-21 DIAGNOSIS — F329 Major depressive disorder, single episode, unspecified: Secondary | ICD-10-CM | POA: Diagnosis not present

## 2019-07-21 DIAGNOSIS — Z8673 Personal history of transient ischemic attack (TIA), and cerebral infarction without residual deficits: Secondary | ICD-10-CM | POA: Diagnosis not present

## 2019-07-21 DIAGNOSIS — Z853 Personal history of malignant neoplasm of breast: Secondary | ICD-10-CM | POA: Diagnosis not present

## 2019-07-23 DIAGNOSIS — M48061 Spinal stenosis, lumbar region without neurogenic claudication: Secondary | ICD-10-CM | POA: Diagnosis not present

## 2019-07-23 DIAGNOSIS — E039 Hypothyroidism, unspecified: Secondary | ICD-10-CM | POA: Diagnosis not present

## 2019-07-23 DIAGNOSIS — E785 Hyperlipidemia, unspecified: Secondary | ICD-10-CM | POA: Diagnosis not present

## 2019-07-23 DIAGNOSIS — I10 Essential (primary) hypertension: Secondary | ICD-10-CM | POA: Diagnosis not present

## 2019-07-23 DIAGNOSIS — Z4789 Encounter for other orthopedic aftercare: Secondary | ICD-10-CM | POA: Diagnosis not present

## 2019-07-23 DIAGNOSIS — I82431 Acute embolism and thrombosis of right popliteal vein: Secondary | ICD-10-CM | POA: Diagnosis not present

## 2019-07-30 DIAGNOSIS — M7130 Other bursal cyst, unspecified site: Secondary | ICD-10-CM | POA: Diagnosis not present

## 2019-07-30 DIAGNOSIS — M4316 Spondylolisthesis, lumbar region: Secondary | ICD-10-CM | POA: Diagnosis not present

## 2019-07-30 DIAGNOSIS — N390 Urinary tract infection, site not specified: Secondary | ICD-10-CM | POA: Diagnosis not present

## 2019-07-30 DIAGNOSIS — Z6830 Body mass index (BMI) 30.0-30.9, adult: Secondary | ICD-10-CM | POA: Diagnosis not present

## 2019-07-30 DIAGNOSIS — I10 Essential (primary) hypertension: Secondary | ICD-10-CM | POA: Diagnosis not present

## 2019-07-31 DIAGNOSIS — M48061 Spinal stenosis, lumbar region without neurogenic claudication: Secondary | ICD-10-CM | POA: Diagnosis not present

## 2019-07-31 DIAGNOSIS — E785 Hyperlipidemia, unspecified: Secondary | ICD-10-CM | POA: Diagnosis not present

## 2019-07-31 DIAGNOSIS — I82431 Acute embolism and thrombosis of right popliteal vein: Secondary | ICD-10-CM | POA: Diagnosis not present

## 2019-07-31 DIAGNOSIS — I10 Essential (primary) hypertension: Secondary | ICD-10-CM | POA: Diagnosis not present

## 2019-07-31 DIAGNOSIS — Z4789 Encounter for other orthopedic aftercare: Secondary | ICD-10-CM | POA: Diagnosis not present

## 2019-07-31 DIAGNOSIS — E039 Hypothyroidism, unspecified: Secondary | ICD-10-CM | POA: Diagnosis not present

## 2019-08-06 DIAGNOSIS — E039 Hypothyroidism, unspecified: Secondary | ICD-10-CM | POA: Diagnosis not present

## 2019-08-06 DIAGNOSIS — E785 Hyperlipidemia, unspecified: Secondary | ICD-10-CM | POA: Diagnosis not present

## 2019-08-06 DIAGNOSIS — Z4789 Encounter for other orthopedic aftercare: Secondary | ICD-10-CM | POA: Diagnosis not present

## 2019-08-06 DIAGNOSIS — I10 Essential (primary) hypertension: Secondary | ICD-10-CM | POA: Diagnosis not present

## 2019-08-06 DIAGNOSIS — M48061 Spinal stenosis, lumbar region without neurogenic claudication: Secondary | ICD-10-CM | POA: Diagnosis not present

## 2019-08-06 DIAGNOSIS — I82431 Acute embolism and thrombosis of right popliteal vein: Secondary | ICD-10-CM | POA: Diagnosis not present

## 2019-08-15 DIAGNOSIS — M48061 Spinal stenosis, lumbar region without neurogenic claudication: Secondary | ICD-10-CM | POA: Diagnosis not present

## 2019-08-15 DIAGNOSIS — Z4789 Encounter for other orthopedic aftercare: Secondary | ICD-10-CM | POA: Diagnosis not present

## 2019-08-15 DIAGNOSIS — I82431 Acute embolism and thrombosis of right popliteal vein: Secondary | ICD-10-CM | POA: Diagnosis not present

## 2019-08-15 DIAGNOSIS — I10 Essential (primary) hypertension: Secondary | ICD-10-CM | POA: Diagnosis not present

## 2019-08-15 DIAGNOSIS — E785 Hyperlipidemia, unspecified: Secondary | ICD-10-CM | POA: Diagnosis not present

## 2019-08-15 DIAGNOSIS — E039 Hypothyroidism, unspecified: Secondary | ICD-10-CM | POA: Diagnosis not present

## 2019-08-19 ENCOUNTER — Telehealth: Payer: Self-pay | Admitting: *Deleted

## 2019-08-19 DIAGNOSIS — E785 Hyperlipidemia, unspecified: Secondary | ICD-10-CM | POA: Diagnosis not present

## 2019-08-19 DIAGNOSIS — Z4789 Encounter for other orthopedic aftercare: Secondary | ICD-10-CM | POA: Diagnosis not present

## 2019-08-19 DIAGNOSIS — M48061 Spinal stenosis, lumbar region without neurogenic claudication: Secondary | ICD-10-CM | POA: Diagnosis not present

## 2019-08-19 DIAGNOSIS — I82431 Acute embolism and thrombosis of right popliteal vein: Secondary | ICD-10-CM | POA: Diagnosis not present

## 2019-08-19 DIAGNOSIS — E039 Hypothyroidism, unspecified: Secondary | ICD-10-CM | POA: Diagnosis not present

## 2019-08-19 DIAGNOSIS — I10 Essential (primary) hypertension: Secondary | ICD-10-CM | POA: Diagnosis not present

## 2019-08-19 NOTE — Telephone Encounter (Signed)
Tara [name], OT, [facility] left a message asking for verbal orders to continue HHOT 1week4 to work on dynamic standing balance for ADL's. Medical record reviewed. Social work note reviewed.  Verbal orders given per office protocol.

## 2019-08-20 DIAGNOSIS — I82431 Acute embolism and thrombosis of right popliteal vein: Secondary | ICD-10-CM | POA: Diagnosis not present

## 2019-08-20 DIAGNOSIS — Z86010 Personal history of colonic polyps: Secondary | ICD-10-CM | POA: Diagnosis not present

## 2019-08-20 DIAGNOSIS — Z981 Arthrodesis status: Secondary | ICD-10-CM | POA: Diagnosis not present

## 2019-08-20 DIAGNOSIS — M48061 Spinal stenosis, lumbar region without neurogenic claudication: Secondary | ICD-10-CM | POA: Diagnosis not present

## 2019-08-20 DIAGNOSIS — I10 Essential (primary) hypertension: Secondary | ICD-10-CM | POA: Diagnosis not present

## 2019-08-20 DIAGNOSIS — G4700 Insomnia, unspecified: Secondary | ICD-10-CM | POA: Diagnosis not present

## 2019-08-20 DIAGNOSIS — Z8673 Personal history of transient ischemic attack (TIA), and cerebral infarction without residual deficits: Secondary | ICD-10-CM | POA: Diagnosis not present

## 2019-08-20 DIAGNOSIS — F329 Major depressive disorder, single episode, unspecified: Secondary | ICD-10-CM | POA: Diagnosis not present

## 2019-08-20 DIAGNOSIS — Z4789 Encounter for other orthopedic aftercare: Secondary | ICD-10-CM | POA: Diagnosis not present

## 2019-08-20 DIAGNOSIS — Z853 Personal history of malignant neoplasm of breast: Secondary | ICD-10-CM | POA: Diagnosis not present

## 2019-08-20 DIAGNOSIS — F419 Anxiety disorder, unspecified: Secondary | ICD-10-CM | POA: Diagnosis not present

## 2019-08-20 DIAGNOSIS — E785 Hyperlipidemia, unspecified: Secondary | ICD-10-CM | POA: Diagnosis not present

## 2019-08-20 DIAGNOSIS — E039 Hypothyroidism, unspecified: Secondary | ICD-10-CM | POA: Diagnosis not present

## 2019-08-20 DIAGNOSIS — E669 Obesity, unspecified: Secondary | ICD-10-CM | POA: Diagnosis not present

## 2019-08-20 DIAGNOSIS — R7303 Prediabetes: Secondary | ICD-10-CM | POA: Diagnosis not present

## 2019-08-20 DIAGNOSIS — Z7901 Long term (current) use of anticoagulants: Secondary | ICD-10-CM | POA: Diagnosis not present

## 2019-08-20 DIAGNOSIS — Z6832 Body mass index (BMI) 32.0-32.9, adult: Secondary | ICD-10-CM | POA: Diagnosis not present

## 2019-08-21 DIAGNOSIS — E785 Hyperlipidemia, unspecified: Secondary | ICD-10-CM | POA: Diagnosis not present

## 2019-08-21 DIAGNOSIS — I82431 Acute embolism and thrombosis of right popliteal vein: Secondary | ICD-10-CM | POA: Diagnosis not present

## 2019-08-21 DIAGNOSIS — M48061 Spinal stenosis, lumbar region without neurogenic claudication: Secondary | ICD-10-CM | POA: Diagnosis not present

## 2019-08-21 DIAGNOSIS — I10 Essential (primary) hypertension: Secondary | ICD-10-CM | POA: Diagnosis not present

## 2019-08-21 DIAGNOSIS — F329 Major depressive disorder, single episode, unspecified: Secondary | ICD-10-CM | POA: Diagnosis not present

## 2019-08-21 DIAGNOSIS — E039 Hypothyroidism, unspecified: Secondary | ICD-10-CM | POA: Diagnosis not present

## 2019-08-23 DIAGNOSIS — F329 Major depressive disorder, single episode, unspecified: Secondary | ICD-10-CM | POA: Diagnosis not present

## 2019-08-23 DIAGNOSIS — I82431 Acute embolism and thrombosis of right popliteal vein: Secondary | ICD-10-CM | POA: Diagnosis not present

## 2019-08-23 DIAGNOSIS — M48061 Spinal stenosis, lumbar region without neurogenic claudication: Secondary | ICD-10-CM | POA: Diagnosis not present

## 2019-08-23 DIAGNOSIS — E039 Hypothyroidism, unspecified: Secondary | ICD-10-CM | POA: Diagnosis not present

## 2019-08-23 DIAGNOSIS — E785 Hyperlipidemia, unspecified: Secondary | ICD-10-CM | POA: Diagnosis not present

## 2019-08-23 DIAGNOSIS — I10 Essential (primary) hypertension: Secondary | ICD-10-CM | POA: Diagnosis not present

## 2019-08-26 DIAGNOSIS — E039 Hypothyroidism, unspecified: Secondary | ICD-10-CM | POA: Diagnosis not present

## 2019-08-26 DIAGNOSIS — E785 Hyperlipidemia, unspecified: Secondary | ICD-10-CM | POA: Diagnosis not present

## 2019-08-26 DIAGNOSIS — F329 Major depressive disorder, single episode, unspecified: Secondary | ICD-10-CM | POA: Diagnosis not present

## 2019-08-26 DIAGNOSIS — I10 Essential (primary) hypertension: Secondary | ICD-10-CM | POA: Diagnosis not present

## 2019-08-26 DIAGNOSIS — M48061 Spinal stenosis, lumbar region without neurogenic claudication: Secondary | ICD-10-CM | POA: Diagnosis not present

## 2019-08-26 DIAGNOSIS — I82431 Acute embolism and thrombosis of right popliteal vein: Secondary | ICD-10-CM | POA: Diagnosis not present

## 2019-08-28 DIAGNOSIS — I82431 Acute embolism and thrombosis of right popliteal vein: Secondary | ICD-10-CM | POA: Diagnosis not present

## 2019-08-28 DIAGNOSIS — F329 Major depressive disorder, single episode, unspecified: Secondary | ICD-10-CM | POA: Diagnosis not present

## 2019-08-28 DIAGNOSIS — I10 Essential (primary) hypertension: Secondary | ICD-10-CM | POA: Diagnosis not present

## 2019-08-28 DIAGNOSIS — E785 Hyperlipidemia, unspecified: Secondary | ICD-10-CM | POA: Diagnosis not present

## 2019-08-28 DIAGNOSIS — M48061 Spinal stenosis, lumbar region without neurogenic claudication: Secondary | ICD-10-CM | POA: Diagnosis not present

## 2019-08-28 DIAGNOSIS — E039 Hypothyroidism, unspecified: Secondary | ICD-10-CM | POA: Diagnosis not present

## 2019-08-29 DIAGNOSIS — Z23 Encounter for immunization: Secondary | ICD-10-CM | POA: Diagnosis not present

## 2019-08-30 DIAGNOSIS — I10 Essential (primary) hypertension: Secondary | ICD-10-CM | POA: Diagnosis not present

## 2019-08-30 DIAGNOSIS — E039 Hypothyroidism, unspecified: Secondary | ICD-10-CM | POA: Diagnosis not present

## 2019-08-30 DIAGNOSIS — M48061 Spinal stenosis, lumbar region without neurogenic claudication: Secondary | ICD-10-CM | POA: Diagnosis not present

## 2019-08-30 DIAGNOSIS — E785 Hyperlipidemia, unspecified: Secondary | ICD-10-CM | POA: Diagnosis not present

## 2019-08-30 DIAGNOSIS — I82431 Acute embolism and thrombosis of right popliteal vein: Secondary | ICD-10-CM | POA: Diagnosis not present

## 2019-08-30 DIAGNOSIS — F329 Major depressive disorder, single episode, unspecified: Secondary | ICD-10-CM | POA: Diagnosis not present

## 2019-09-02 DIAGNOSIS — F329 Major depressive disorder, single episode, unspecified: Secondary | ICD-10-CM | POA: Diagnosis not present

## 2019-09-02 DIAGNOSIS — E785 Hyperlipidemia, unspecified: Secondary | ICD-10-CM | POA: Diagnosis not present

## 2019-09-02 DIAGNOSIS — I10 Essential (primary) hypertension: Secondary | ICD-10-CM | POA: Diagnosis not present

## 2019-09-02 DIAGNOSIS — I82431 Acute embolism and thrombosis of right popliteal vein: Secondary | ICD-10-CM | POA: Diagnosis not present

## 2019-09-02 DIAGNOSIS — E039 Hypothyroidism, unspecified: Secondary | ICD-10-CM | POA: Diagnosis not present

## 2019-09-02 DIAGNOSIS — M48061 Spinal stenosis, lumbar region without neurogenic claudication: Secondary | ICD-10-CM | POA: Diagnosis not present

## 2019-09-04 DIAGNOSIS — E039 Hypothyroidism, unspecified: Secondary | ICD-10-CM | POA: Diagnosis not present

## 2019-09-04 DIAGNOSIS — M48061 Spinal stenosis, lumbar region without neurogenic claudication: Secondary | ICD-10-CM | POA: Diagnosis not present

## 2019-09-04 DIAGNOSIS — I10 Essential (primary) hypertension: Secondary | ICD-10-CM | POA: Diagnosis not present

## 2019-09-04 DIAGNOSIS — I82431 Acute embolism and thrombosis of right popliteal vein: Secondary | ICD-10-CM | POA: Diagnosis not present

## 2019-09-04 DIAGNOSIS — F329 Major depressive disorder, single episode, unspecified: Secondary | ICD-10-CM | POA: Diagnosis not present

## 2019-09-04 DIAGNOSIS — E785 Hyperlipidemia, unspecified: Secondary | ICD-10-CM | POA: Diagnosis not present

## 2019-09-06 DIAGNOSIS — I10 Essential (primary) hypertension: Secondary | ICD-10-CM | POA: Diagnosis not present

## 2019-09-06 DIAGNOSIS — I82431 Acute embolism and thrombosis of right popliteal vein: Secondary | ICD-10-CM | POA: Diagnosis not present

## 2019-09-06 DIAGNOSIS — E785 Hyperlipidemia, unspecified: Secondary | ICD-10-CM | POA: Diagnosis not present

## 2019-09-06 DIAGNOSIS — E039 Hypothyroidism, unspecified: Secondary | ICD-10-CM | POA: Diagnosis not present

## 2019-09-06 DIAGNOSIS — M48061 Spinal stenosis, lumbar region without neurogenic claudication: Secondary | ICD-10-CM | POA: Diagnosis not present

## 2019-09-06 DIAGNOSIS — F329 Major depressive disorder, single episode, unspecified: Secondary | ICD-10-CM | POA: Diagnosis not present

## 2019-09-10 DIAGNOSIS — I10 Essential (primary) hypertension: Secondary | ICD-10-CM | POA: Diagnosis not present

## 2019-09-10 DIAGNOSIS — F329 Major depressive disorder, single episode, unspecified: Secondary | ICD-10-CM | POA: Diagnosis not present

## 2019-09-10 DIAGNOSIS — E039 Hypothyroidism, unspecified: Secondary | ICD-10-CM | POA: Diagnosis not present

## 2019-09-10 DIAGNOSIS — E785 Hyperlipidemia, unspecified: Secondary | ICD-10-CM | POA: Diagnosis not present

## 2019-09-10 DIAGNOSIS — M48061 Spinal stenosis, lumbar region without neurogenic claudication: Secondary | ICD-10-CM | POA: Diagnosis not present

## 2019-09-10 DIAGNOSIS — I82431 Acute embolism and thrombosis of right popliteal vein: Secondary | ICD-10-CM | POA: Diagnosis not present

## 2019-09-11 DIAGNOSIS — M48061 Spinal stenosis, lumbar region without neurogenic claudication: Secondary | ICD-10-CM | POA: Diagnosis not present

## 2019-09-11 DIAGNOSIS — I10 Essential (primary) hypertension: Secondary | ICD-10-CM | POA: Diagnosis not present

## 2019-09-11 DIAGNOSIS — I82431 Acute embolism and thrombosis of right popliteal vein: Secondary | ICD-10-CM | POA: Diagnosis not present

## 2019-09-11 DIAGNOSIS — E785 Hyperlipidemia, unspecified: Secondary | ICD-10-CM | POA: Diagnosis not present

## 2019-09-11 DIAGNOSIS — E039 Hypothyroidism, unspecified: Secondary | ICD-10-CM | POA: Diagnosis not present

## 2019-09-11 DIAGNOSIS — F329 Major depressive disorder, single episode, unspecified: Secondary | ICD-10-CM | POA: Diagnosis not present

## 2019-09-12 DIAGNOSIS — M48061 Spinal stenosis, lumbar region without neurogenic claudication: Secondary | ICD-10-CM | POA: Diagnosis not present

## 2019-09-12 DIAGNOSIS — E785 Hyperlipidemia, unspecified: Secondary | ICD-10-CM | POA: Diagnosis not present

## 2019-09-12 DIAGNOSIS — E039 Hypothyroidism, unspecified: Secondary | ICD-10-CM | POA: Diagnosis not present

## 2019-09-12 DIAGNOSIS — I82431 Acute embolism and thrombosis of right popliteal vein: Secondary | ICD-10-CM | POA: Diagnosis not present

## 2019-09-12 DIAGNOSIS — I10 Essential (primary) hypertension: Secondary | ICD-10-CM | POA: Diagnosis not present

## 2019-09-12 DIAGNOSIS — F329 Major depressive disorder, single episode, unspecified: Secondary | ICD-10-CM | POA: Diagnosis not present

## 2019-09-17 DIAGNOSIS — E039 Hypothyroidism, unspecified: Secondary | ICD-10-CM | POA: Diagnosis not present

## 2019-09-17 DIAGNOSIS — I10 Essential (primary) hypertension: Secondary | ICD-10-CM | POA: Diagnosis not present

## 2019-09-17 DIAGNOSIS — E785 Hyperlipidemia, unspecified: Secondary | ICD-10-CM | POA: Diagnosis not present

## 2019-09-17 DIAGNOSIS — F329 Major depressive disorder, single episode, unspecified: Secondary | ICD-10-CM | POA: Diagnosis not present

## 2019-09-17 DIAGNOSIS — I82431 Acute embolism and thrombosis of right popliteal vein: Secondary | ICD-10-CM | POA: Diagnosis not present

## 2019-09-17 DIAGNOSIS — M48061 Spinal stenosis, lumbar region without neurogenic claudication: Secondary | ICD-10-CM | POA: Diagnosis not present

## 2019-09-18 DIAGNOSIS — E039 Hypothyroidism, unspecified: Secondary | ICD-10-CM | POA: Diagnosis not present

## 2019-09-18 DIAGNOSIS — F329 Major depressive disorder, single episode, unspecified: Secondary | ICD-10-CM | POA: Diagnosis not present

## 2019-09-18 DIAGNOSIS — I10 Essential (primary) hypertension: Secondary | ICD-10-CM | POA: Diagnosis not present

## 2019-09-18 DIAGNOSIS — M48061 Spinal stenosis, lumbar region without neurogenic claudication: Secondary | ICD-10-CM | POA: Diagnosis not present

## 2019-09-18 DIAGNOSIS — I82431 Acute embolism and thrombosis of right popliteal vein: Secondary | ICD-10-CM | POA: Diagnosis not present

## 2019-09-18 DIAGNOSIS — E785 Hyperlipidemia, unspecified: Secondary | ICD-10-CM | POA: Diagnosis not present

## 2019-09-19 DIAGNOSIS — E785 Hyperlipidemia, unspecified: Secondary | ICD-10-CM | POA: Diagnosis not present

## 2019-09-19 DIAGNOSIS — G4700 Insomnia, unspecified: Secondary | ICD-10-CM | POA: Diagnosis not present

## 2019-09-19 DIAGNOSIS — R7303 Prediabetes: Secondary | ICD-10-CM | POA: Diagnosis not present

## 2019-09-19 DIAGNOSIS — Z86010 Personal history of colonic polyps: Secondary | ICD-10-CM | POA: Diagnosis not present

## 2019-09-19 DIAGNOSIS — Z7901 Long term (current) use of anticoagulants: Secondary | ICD-10-CM | POA: Diagnosis not present

## 2019-09-19 DIAGNOSIS — Z981 Arthrodesis status: Secondary | ICD-10-CM | POA: Diagnosis not present

## 2019-09-19 DIAGNOSIS — Z8673 Personal history of transient ischemic attack (TIA), and cerebral infarction without residual deficits: Secondary | ICD-10-CM | POA: Diagnosis not present

## 2019-09-19 DIAGNOSIS — F419 Anxiety disorder, unspecified: Secondary | ICD-10-CM | POA: Diagnosis not present

## 2019-09-19 DIAGNOSIS — E669 Obesity, unspecified: Secondary | ICD-10-CM | POA: Diagnosis not present

## 2019-09-19 DIAGNOSIS — F329 Major depressive disorder, single episode, unspecified: Secondary | ICD-10-CM | POA: Diagnosis not present

## 2019-09-19 DIAGNOSIS — I82431 Acute embolism and thrombosis of right popliteal vein: Secondary | ICD-10-CM | POA: Diagnosis not present

## 2019-09-19 DIAGNOSIS — E039 Hypothyroidism, unspecified: Secondary | ICD-10-CM | POA: Diagnosis not present

## 2019-09-19 DIAGNOSIS — Z4789 Encounter for other orthopedic aftercare: Secondary | ICD-10-CM | POA: Diagnosis not present

## 2019-09-19 DIAGNOSIS — Z853 Personal history of malignant neoplasm of breast: Secondary | ICD-10-CM | POA: Diagnosis not present

## 2019-09-19 DIAGNOSIS — I10 Essential (primary) hypertension: Secondary | ICD-10-CM | POA: Diagnosis not present

## 2019-09-19 DIAGNOSIS — M48061 Spinal stenosis, lumbar region without neurogenic claudication: Secondary | ICD-10-CM | POA: Diagnosis not present

## 2019-09-19 DIAGNOSIS — Z6832 Body mass index (BMI) 32.0-32.9, adult: Secondary | ICD-10-CM | POA: Diagnosis not present

## 2019-09-23 DIAGNOSIS — E785 Hyperlipidemia, unspecified: Secondary | ICD-10-CM | POA: Diagnosis not present

## 2019-09-23 DIAGNOSIS — I82431 Acute embolism and thrombosis of right popliteal vein: Secondary | ICD-10-CM | POA: Diagnosis not present

## 2019-09-23 DIAGNOSIS — E039 Hypothyroidism, unspecified: Secondary | ICD-10-CM | POA: Diagnosis not present

## 2019-09-23 DIAGNOSIS — F329 Major depressive disorder, single episode, unspecified: Secondary | ICD-10-CM | POA: Diagnosis not present

## 2019-09-23 DIAGNOSIS — I10 Essential (primary) hypertension: Secondary | ICD-10-CM | POA: Diagnosis not present

## 2019-09-23 DIAGNOSIS — M48061 Spinal stenosis, lumbar region without neurogenic claudication: Secondary | ICD-10-CM | POA: Diagnosis not present

## 2019-09-30 DIAGNOSIS — Z1331 Encounter for screening for depression: Secondary | ICD-10-CM | POA: Diagnosis not present

## 2019-09-30 DIAGNOSIS — Z86718 Personal history of other venous thrombosis and embolism: Secondary | ICD-10-CM | POA: Diagnosis not present

## 2019-09-30 DIAGNOSIS — Z6832 Body mass index (BMI) 32.0-32.9, adult: Secondary | ICD-10-CM | POA: Diagnosis not present

## 2019-09-30 DIAGNOSIS — I10 Essential (primary) hypertension: Secondary | ICD-10-CM | POA: Diagnosis not present

## 2019-09-30 DIAGNOSIS — Z79899 Other long term (current) drug therapy: Secondary | ICD-10-CM | POA: Diagnosis not present

## 2019-09-30 DIAGNOSIS — E039 Hypothyroidism, unspecified: Secondary | ICD-10-CM | POA: Diagnosis not present

## 2019-10-01 DIAGNOSIS — M48061 Spinal stenosis, lumbar region without neurogenic claudication: Secondary | ICD-10-CM | POA: Diagnosis not present

## 2019-10-01 DIAGNOSIS — I82431 Acute embolism and thrombosis of right popliteal vein: Secondary | ICD-10-CM | POA: Diagnosis not present

## 2019-10-01 DIAGNOSIS — E785 Hyperlipidemia, unspecified: Secondary | ICD-10-CM | POA: Diagnosis not present

## 2019-10-01 DIAGNOSIS — I10 Essential (primary) hypertension: Secondary | ICD-10-CM | POA: Diagnosis not present

## 2019-10-01 DIAGNOSIS — E039 Hypothyroidism, unspecified: Secondary | ICD-10-CM | POA: Diagnosis not present

## 2019-10-01 DIAGNOSIS — F329 Major depressive disorder, single episode, unspecified: Secondary | ICD-10-CM | POA: Diagnosis not present

## 2019-10-08 DIAGNOSIS — E785 Hyperlipidemia, unspecified: Secondary | ICD-10-CM | POA: Diagnosis not present

## 2019-10-08 DIAGNOSIS — I10 Essential (primary) hypertension: Secondary | ICD-10-CM | POA: Diagnosis not present

## 2019-10-08 DIAGNOSIS — M48061 Spinal stenosis, lumbar region without neurogenic claudication: Secondary | ICD-10-CM | POA: Diagnosis not present

## 2019-10-08 DIAGNOSIS — E039 Hypothyroidism, unspecified: Secondary | ICD-10-CM | POA: Diagnosis not present

## 2019-10-08 DIAGNOSIS — I82431 Acute embolism and thrombosis of right popliteal vein: Secondary | ICD-10-CM | POA: Diagnosis not present

## 2019-10-08 DIAGNOSIS — F329 Major depressive disorder, single episode, unspecified: Secondary | ICD-10-CM | POA: Diagnosis not present

## 2019-10-16 DIAGNOSIS — E039 Hypothyroidism, unspecified: Secondary | ICD-10-CM | POA: Diagnosis not present

## 2019-10-16 DIAGNOSIS — F329 Major depressive disorder, single episode, unspecified: Secondary | ICD-10-CM | POA: Diagnosis not present

## 2019-10-16 DIAGNOSIS — I82431 Acute embolism and thrombosis of right popliteal vein: Secondary | ICD-10-CM | POA: Diagnosis not present

## 2019-10-16 DIAGNOSIS — M48061 Spinal stenosis, lumbar region without neurogenic claudication: Secondary | ICD-10-CM | POA: Diagnosis not present

## 2019-10-16 DIAGNOSIS — I10 Essential (primary) hypertension: Secondary | ICD-10-CM | POA: Diagnosis not present

## 2019-10-16 DIAGNOSIS — E785 Hyperlipidemia, unspecified: Secondary | ICD-10-CM | POA: Diagnosis not present

## 2019-10-19 DIAGNOSIS — E785 Hyperlipidemia, unspecified: Secondary | ICD-10-CM | POA: Diagnosis not present

## 2019-10-19 DIAGNOSIS — Z7901 Long term (current) use of anticoagulants: Secondary | ICD-10-CM | POA: Diagnosis not present

## 2019-10-19 DIAGNOSIS — Z6832 Body mass index (BMI) 32.0-32.9, adult: Secondary | ICD-10-CM | POA: Diagnosis not present

## 2019-10-19 DIAGNOSIS — E039 Hypothyroidism, unspecified: Secondary | ICD-10-CM | POA: Diagnosis not present

## 2019-10-19 DIAGNOSIS — E669 Obesity, unspecified: Secondary | ICD-10-CM | POA: Diagnosis not present

## 2019-10-19 DIAGNOSIS — M48061 Spinal stenosis, lumbar region without neurogenic claudication: Secondary | ICD-10-CM | POA: Diagnosis not present

## 2019-10-19 DIAGNOSIS — F329 Major depressive disorder, single episode, unspecified: Secondary | ICD-10-CM | POA: Diagnosis not present

## 2019-10-19 DIAGNOSIS — F419 Anxiety disorder, unspecified: Secondary | ICD-10-CM | POA: Diagnosis not present

## 2019-10-19 DIAGNOSIS — Z8673 Personal history of transient ischemic attack (TIA), and cerebral infarction without residual deficits: Secondary | ICD-10-CM | POA: Diagnosis not present

## 2019-10-19 DIAGNOSIS — I10 Essential (primary) hypertension: Secondary | ICD-10-CM | POA: Diagnosis not present

## 2019-10-19 DIAGNOSIS — Z86010 Personal history of colonic polyps: Secondary | ICD-10-CM | POA: Diagnosis not present

## 2019-10-19 DIAGNOSIS — Z853 Personal history of malignant neoplasm of breast: Secondary | ICD-10-CM | POA: Diagnosis not present

## 2019-10-19 DIAGNOSIS — G4700 Insomnia, unspecified: Secondary | ICD-10-CM | POA: Diagnosis not present

## 2019-10-19 DIAGNOSIS — I82431 Acute embolism and thrombosis of right popliteal vein: Secondary | ICD-10-CM | POA: Diagnosis not present

## 2019-10-19 DIAGNOSIS — Z981 Arthrodesis status: Secondary | ICD-10-CM | POA: Diagnosis not present

## 2019-10-19 DIAGNOSIS — R7303 Prediabetes: Secondary | ICD-10-CM | POA: Diagnosis not present

## 2019-10-22 DIAGNOSIS — F329 Major depressive disorder, single episode, unspecified: Secondary | ICD-10-CM | POA: Diagnosis not present

## 2019-10-22 DIAGNOSIS — I10 Essential (primary) hypertension: Secondary | ICD-10-CM | POA: Diagnosis not present

## 2019-10-22 DIAGNOSIS — I82431 Acute embolism and thrombosis of right popliteal vein: Secondary | ICD-10-CM | POA: Diagnosis not present

## 2019-10-22 DIAGNOSIS — E039 Hypothyroidism, unspecified: Secondary | ICD-10-CM | POA: Diagnosis not present

## 2019-10-22 DIAGNOSIS — E785 Hyperlipidemia, unspecified: Secondary | ICD-10-CM | POA: Diagnosis not present

## 2019-10-22 DIAGNOSIS — M48061 Spinal stenosis, lumbar region without neurogenic claudication: Secondary | ICD-10-CM | POA: Diagnosis not present

## 2019-10-30 DIAGNOSIS — G4700 Insomnia, unspecified: Secondary | ICD-10-CM

## 2019-10-30 DIAGNOSIS — Z8673 Personal history of transient ischemic attack (TIA), and cerebral infarction without residual deficits: Secondary | ICD-10-CM | POA: Diagnosis not present

## 2019-10-30 DIAGNOSIS — E785 Hyperlipidemia, unspecified: Secondary | ICD-10-CM | POA: Diagnosis not present

## 2019-10-30 DIAGNOSIS — Z7901 Long term (current) use of anticoagulants: Secondary | ICD-10-CM

## 2019-10-30 DIAGNOSIS — E039 Hypothyroidism, unspecified: Secondary | ICD-10-CM | POA: Diagnosis not present

## 2019-10-30 DIAGNOSIS — Z981 Arthrodesis status: Secondary | ICD-10-CM

## 2019-10-30 DIAGNOSIS — Z86010 Personal history of colonic polyps: Secondary | ICD-10-CM

## 2019-10-30 DIAGNOSIS — I82431 Acute embolism and thrombosis of right popliteal vein: Secondary | ICD-10-CM | POA: Diagnosis not present

## 2019-10-30 DIAGNOSIS — F419 Anxiety disorder, unspecified: Secondary | ICD-10-CM | POA: Diagnosis not present

## 2019-10-30 DIAGNOSIS — F329 Major depressive disorder, single episode, unspecified: Secondary | ICD-10-CM

## 2019-10-30 DIAGNOSIS — R7303 Prediabetes: Secondary | ICD-10-CM | POA: Diagnosis not present

## 2019-10-30 DIAGNOSIS — Z853 Personal history of malignant neoplasm of breast: Secondary | ICD-10-CM

## 2019-10-30 DIAGNOSIS — Z6832 Body mass index (BMI) 32.0-32.9, adult: Secondary | ICD-10-CM | POA: Diagnosis not present

## 2019-10-30 DIAGNOSIS — E669 Obesity, unspecified: Secondary | ICD-10-CM | POA: Diagnosis not present

## 2019-10-30 DIAGNOSIS — I10 Essential (primary) hypertension: Secondary | ICD-10-CM | POA: Diagnosis not present

## 2019-10-30 DIAGNOSIS — M48061 Spinal stenosis, lumbar region without neurogenic claudication: Secondary | ICD-10-CM | POA: Diagnosis not present

## 2020-02-13 DIAGNOSIS — M4316 Spondylolisthesis, lumbar region: Secondary | ICD-10-CM | POA: Diagnosis not present

## 2020-02-18 DIAGNOSIS — Z96652 Presence of left artificial knee joint: Secondary | ICD-10-CM | POA: Diagnosis not present

## 2020-02-18 DIAGNOSIS — Z471 Aftercare following joint replacement surgery: Secondary | ICD-10-CM | POA: Diagnosis not present

## 2020-02-18 DIAGNOSIS — Z96651 Presence of right artificial knee joint: Secondary | ICD-10-CM | POA: Diagnosis not present

## 2020-02-18 DIAGNOSIS — Z96653 Presence of artificial knee joint, bilateral: Secondary | ICD-10-CM | POA: Diagnosis not present

## 2020-02-27 DIAGNOSIS — Z0120 Encounter for dental examination and cleaning without abnormal findings: Secondary | ICD-10-CM | POA: Diagnosis not present

## 2020-03-18 ENCOUNTER — Ambulatory Visit: Payer: Medicare Other | Admitting: Sports Medicine

## 2020-03-18 ENCOUNTER — Other Ambulatory Visit: Payer: Self-pay

## 2020-03-18 ENCOUNTER — Encounter: Payer: Self-pay | Admitting: Sports Medicine

## 2020-03-18 ENCOUNTER — Other Ambulatory Visit: Payer: Self-pay | Admitting: Sports Medicine

## 2020-03-18 DIAGNOSIS — M21619 Bunion of unspecified foot: Secondary | ICD-10-CM

## 2020-03-18 DIAGNOSIS — R200 Anesthesia of skin: Secondary | ICD-10-CM

## 2020-03-18 DIAGNOSIS — R2681 Unsteadiness on feet: Secondary | ICD-10-CM | POA: Diagnosis not present

## 2020-03-18 DIAGNOSIS — I739 Peripheral vascular disease, unspecified: Secondary | ICD-10-CM

## 2020-03-18 DIAGNOSIS — R202 Paresthesia of skin: Secondary | ICD-10-CM

## 2020-03-18 DIAGNOSIS — M2040 Other hammer toe(s) (acquired), unspecified foot: Secondary | ICD-10-CM | POA: Diagnosis not present

## 2020-03-18 DIAGNOSIS — M48061 Spinal stenosis, lumbar region without neurogenic claudication: Secondary | ICD-10-CM

## 2020-03-18 NOTE — Progress Notes (Signed)
Subjective: Tara [name] is a 84 y.o. female patient who presents to office for evaluation of both feet, reports that she is concerned that she is unsteady on her feet and has numbness to both feet. Reports that she has history of knee problems and had back surgery and since the surgery in July 2020 had numbness, reports that her back doctor, Dr. Weston Settle said that it can take a year to heal. Reports that she also has bunions and hammertoes and is wondering what can be done about those and as well. Patient denies any other pedal complaints. Denies injury/trip/ recent fall/sprain/any causative factors.   Review of Systems  All other systems reviewed and are negative.   Patient Active Problem List   Diagnosis Date Noted  . Anxiety state   . Acute blood loss anemia   . Lumbar radiculopathy 05/28/2019  . Postoperative pain   . Drug induced constipation   . History of TIA (transient ischemic attack)   . Lumbar spinal stenosis 05/23/2019  . HNP (herniated nucleus pulposus), lumbar 05/23/2019  . TIA (transient ischemic attack) 07/02/2018  . Chronic nonintractable headache 07/02/2018  . Onychomycosis due to dermatophyte 05/10/2018  . Other acquired deformity of toe 05/10/2018  . S/P total knee replacement 12/25/2017  . Chronic pain of right knee 10/19/2017  . Ductal carcinoma in situ of right breast 08/17/2015  . Breast cancer of upper-outer quadrant of right female breast ([place]) 08/14/2015  . Right hip pain 10/27/2012  . Low back pain 10/27/2012  . Hypertension   . Osteoarthritis of right knee 11/30/2011  . Baker's cyst of knee 11/30/2011  . Dependent edema 06/19/2011  . Prediabetes 06/19/2011  . Urinary tract infection, recurrent 06/19/2011  . Urinary incontinence 06/19/2011  . Benign positional vertigo 06/19/2011  . Hypothyroidism 06/19/2011  . Tubular adenoma of colon 06/19/2011  . Essential hypertension, benign 04/15/2011    Current Outpatient Medications on File Prior to Visit   Medication Sig Dispense Refill  . acetaminophen (TYLENOL) 325 MG tablet Take 2 tablets (650 mg total) by mouth every 4 (four) hours as needed for mild pain ((score 1 to 3) or temp > 100.5).    Marland Kitchen amLODipine (NORVASC) 10 MG tablet Take 1 tablet (10 mg total) by mouth daily. 30 tablet 1  . aspirin 325 MG tablet Take 1 tablet (325 mg total) by mouth daily. (Patient taking differently: Take 81 mg by mouth daily. )    . atorvastatin (LIPITOR) 40 MG tablet Take 1 tablet (40 mg total) by mouth daily. 90 tablet 3  . diazepam (VALIUM) 5 MG tablet Take 1 tablet (5 mg total) by mouth 2 (two) times daily as needed for anxiety.    Marland Kitchen levothyroxine (SYNTHROID) 75 MCG tablet Take 1 tablet (75 mcg total) by mouth daily. 90 tablet 0  . Cholecalciferol 50 MCG (2000 UT) CAPS Take 1 capsule (2,000 Units total) by mouth daily. 30 capsule 1   No current facility-administered medications on file prior to visit.    Allergies  Allergen Reactions  . Ceclor [Cefaclor] Hives and Itching    Objective:  General: Alert and oriented x3 in no acute distress  Dermatology: No open lesions bilateral lower extremities, no webspace macerations, no ecchymosis bilateral, all nails x 10 are well manicured.  Vascular: Dorsalis Pedis and Posterior Tibial pedal pulses palpable faintly, Capillary Fill Time 5 seconds,(-) pedal hair growth bilateral, 1+ pitting edema bilateral lower extremities, +varcosities. Temperature gradient within normal limits.  Neurology: Gross sensation intact via light  touch bilateral, Subjective numbness to both feet.   Musculoskeletal: No pain to bunion and hammertoes with cross over deformity, 5/5 muscle strength in both lower extremities, History of gait instability no recent fall, requires rolling walker.  Assessment and Plan: Problem List Items Addressed This Visit      Other   Lumbar spinal stenosis    Other Visit Diagnoses    Gait instability    -  Primary   Numbness and tingling of both feet        Bunion       Hammer toe, unspecified laterality       PVD (peripheral vascular disease) ([place])           -Complete examination performed -Patient declined xrays at this visit -Discussed treatement options and multiple cause of gait unsteadiness  -Rx Moore balance braces; patient to see Liliane Channel for this -Recommend topical pain cream for numbness in feet and to use pillow when sleeping to take pressure off back that could be adding to pressure to nerve that is causing numbness or after back surgery  -Advised good supportive shoes daily for foot type and advised patient to use cushions for bunions and hammertoes however I did make patient aware that the more pads/cushions in shoes can take up space and cause rubbing -Patient to return to office as scheduled or sooner if condition worsens.  Landis Martins, DPM

## 2020-03-25 DIAGNOSIS — Z6832 Body mass index (BMI) 32.0-32.9, adult: Secondary | ICD-10-CM | POA: Diagnosis not present

## 2020-03-25 DIAGNOSIS — R42 Dizziness and giddiness: Secondary | ICD-10-CM | POA: Diagnosis not present

## 2020-03-25 DIAGNOSIS — Z1331 Encounter for screening for depression: Secondary | ICD-10-CM | POA: Diagnosis not present

## 2020-03-25 DIAGNOSIS — Z0000 Encounter for general adult medical examination without abnormal findings: Secondary | ICD-10-CM | POA: Diagnosis not present

## 2020-04-03 ENCOUNTER — Other Ambulatory Visit: Payer: Medicare Other | Admitting: Orthotics

## 2020-04-13 DIAGNOSIS — H8112 Benign paroxysmal vertigo, left ear: Secondary | ICD-10-CM | POA: Diagnosis not present

## 2020-04-13 DIAGNOSIS — R2689 Other abnormalities of gait and mobility: Secondary | ICD-10-CM | POA: Diagnosis not present

## 2020-04-13 DIAGNOSIS — M6281 Muscle weakness (generalized): Secondary | ICD-10-CM | POA: Diagnosis not present

## 2020-04-13 DIAGNOSIS — R2681 Unsteadiness on feet: Secondary | ICD-10-CM | POA: Diagnosis not present

## 2020-04-21 ENCOUNTER — Other Ambulatory Visit: Payer: Medicare Other | Admitting: Orthotics

## 2020-04-21 ENCOUNTER — Other Ambulatory Visit: Payer: Self-pay

## 2020-04-23 DIAGNOSIS — R2689 Other abnormalities of gait and mobility: Secondary | ICD-10-CM | POA: Diagnosis not present

## 2020-04-23 DIAGNOSIS — M6281 Muscle weakness (generalized): Secondary | ICD-10-CM | POA: Diagnosis not present

## 2020-04-23 DIAGNOSIS — R2681 Unsteadiness on feet: Secondary | ICD-10-CM | POA: Diagnosis not present

## 2020-04-23 DIAGNOSIS — H8112 Benign paroxysmal vertigo, left ear: Secondary | ICD-10-CM | POA: Diagnosis not present

## 2020-04-24 DIAGNOSIS — R2681 Unsteadiness on feet: Secondary | ICD-10-CM | POA: Diagnosis not present

## 2020-04-24 DIAGNOSIS — M6281 Muscle weakness (generalized): Secondary | ICD-10-CM | POA: Diagnosis not present

## 2020-04-24 DIAGNOSIS — R2689 Other abnormalities of gait and mobility: Secondary | ICD-10-CM | POA: Diagnosis not present

## 2020-04-24 DIAGNOSIS — H8112 Benign paroxysmal vertigo, left ear: Secondary | ICD-10-CM | POA: Diagnosis not present

## 2020-04-27 DIAGNOSIS — R2689 Other abnormalities of gait and mobility: Secondary | ICD-10-CM | POA: Diagnosis not present

## 2020-04-27 DIAGNOSIS — H8112 Benign paroxysmal vertigo, left ear: Secondary | ICD-10-CM | POA: Diagnosis not present

## 2020-04-27 DIAGNOSIS — R2681 Unsteadiness on feet: Secondary | ICD-10-CM | POA: Diagnosis not present

## 2020-04-27 DIAGNOSIS — M6281 Muscle weakness (generalized): Secondary | ICD-10-CM | POA: Diagnosis not present

## 2020-04-29 ENCOUNTER — Other Ambulatory Visit: Payer: Medicare Other | Admitting: Orthotics

## 2020-04-30 ENCOUNTER — Ambulatory Visit: Payer: Medicare Other | Admitting: Podiatry

## 2020-04-30 ENCOUNTER — Other Ambulatory Visit: Payer: Self-pay

## 2020-04-30 ENCOUNTER — Encounter: Payer: Self-pay | Admitting: Podiatry

## 2020-04-30 DIAGNOSIS — M2041 Other hammer toe(s) (acquired), right foot: Secondary | ICD-10-CM

## 2020-04-30 DIAGNOSIS — B351 Tinea unguium: Secondary | ICD-10-CM

## 2020-04-30 DIAGNOSIS — M79675 Pain in left toe(s): Secondary | ICD-10-CM

## 2020-04-30 DIAGNOSIS — M2011 Hallux valgus (acquired), right foot: Secondary | ICD-10-CM

## 2020-04-30 DIAGNOSIS — M79674 Pain in right toe(s): Secondary | ICD-10-CM | POA: Diagnosis not present

## 2020-04-30 DIAGNOSIS — M2012 Hallux valgus (acquired), left foot: Secondary | ICD-10-CM

## 2020-04-30 DIAGNOSIS — M2042 Other hammer toe(s) (acquired), left foot: Secondary | ICD-10-CM

## 2020-04-30 DIAGNOSIS — I739 Peripheral vascular disease, unspecified: Secondary | ICD-10-CM

## 2020-05-01 DIAGNOSIS — M6281 Muscle weakness (generalized): Secondary | ICD-10-CM | POA: Diagnosis not present

## 2020-05-01 DIAGNOSIS — R2689 Other abnormalities of gait and mobility: Secondary | ICD-10-CM | POA: Diagnosis not present

## 2020-05-01 DIAGNOSIS — R2681 Unsteadiness on feet: Secondary | ICD-10-CM | POA: Diagnosis not present

## 2020-05-01 DIAGNOSIS — H8112 Benign paroxysmal vertigo, left ear: Secondary | ICD-10-CM | POA: Diagnosis not present

## 2020-05-01 NOTE — Progress Notes (Signed)
Subjective: Tara [name] is a 84 y.o. female patient seen today for at risk foot care. Patient has h/o PAD and painful mycotic nails b/l that are difficult to trim. Pain interferes with ambulation. Aggravating factors include wearing enclosed shoe gear. Pain is relieved with periodic professional debridement.  Patient Active Problem List   Diagnosis Date Noted  . Anxiety state   . Acute blood loss anemia   . Lumbar radiculopathy 05/28/2019  . Postoperative pain   . Drug induced constipation   . History of TIA (transient ischemic attack)   . Lumbar spinal stenosis 05/23/2019  . HNP (herniated nucleus pulposus), lumbar 05/23/2019  . TIA (transient ischemic attack) 07/02/2018  . Chronic nonintractable headache 07/02/2018  . Onychomycosis due to dermatophyte 05/10/2018  . Other acquired deformity of toe 05/10/2018  . S/P total knee replacement 12/25/2017  . Chronic pain of right knee 10/19/2017  . Ductal carcinoma in situ of right breast 08/17/2015  . Breast cancer of upper-outer quadrant of right female breast ([place]) 08/14/2015  . Right hip pain 10/27/2012  . Low back pain 10/27/2012  . Hypertension   . Osteoarthritis of right knee 11/30/2011  . Baker's cyst of knee 11/30/2011  . Dependent edema 06/19/2011  . Prediabetes 06/19/2011  . Urinary tract infection, recurrent 06/19/2011  . Urinary incontinence 06/19/2011  . Benign positional vertigo 06/19/2011  . Hypothyroidism 06/19/2011  . Tubular adenoma of colon 06/19/2011  . Essential hypertension, benign 04/15/2011    Current Outpatient Medications on File Prior to Visit  Medication Sig Dispense Refill  . aspirin EC 81 MG tablet Take 81 mg by mouth daily.    Marland Kitchen acetaminophen (TYLENOL) 325 MG tablet Take 2 tablets (650 mg total) by mouth every 4 (four) hours as needed for mild pain ((score 1 to 3) or temp > 100.5).    Marland Kitchen amLODipine (NORVASC) 10 MG tablet Take 1 tablet (10 mg total) by mouth daily. 30 tablet 1  . atorvastatin  (LIPITOR) 40 MG tablet Take 1 tablet (40 mg total) by mouth daily. 90 tablet 3  . Cholecalciferol 50 MCG (2000 UT) CAPS Take 1 capsule (2,000 Units total) by mouth daily. 30 capsule 1  . diazepam (VALIUM) 5 MG tablet Take 1 tablet (5 mg total) by mouth 2 (two) times daily as needed for anxiety.    Marland Kitchen levothyroxine (SYNTHROID) 75 MCG tablet Take 1 tablet (75 mcg total) by mouth daily. 90 tablet 0  . losartan (COZAAR) 25 MG tablet Take 25 mg by mouth daily.     No current facility-administered medications on file prior to visit.    Allergies  Allergen Reactions  . Ceclor [Cefaclor] Hives and Itching    Objective: Physical Exam  General: Patient is a pleasant 84 y.o. Caucasian female in no acute distress, Awake, alert and oriented x 3  Neurovascular status unchanged b/l.  Capillary refill time to digits <4 seconds b/l. Faintly palpable pedal pulses b/l. Pedal hair absent b/l. Skin temperature gradient within normal limits b/l. +1 pitting edema b/l lower extremities.   Protective sensation intact 5/5 intact bilaterally with 10g monofilament b/l. Vibratory sensation intact b/l. Proprioception intact bilaterally.  Dermatological:  Pedal skin with normal turgor, texture and tone bilaterally. No open wounds bilaterally. No interdigital macerations bilaterally. Toenails 1-5 b/l elongated, discolored, dystrophic, thickened, crumbly with subungual debris and tenderness to dorsal palpation.  Musculoskeletal:  Normal muscle strength 5/5 to all lower extremity muscle groups bilaterally. No pain crepitus or joint limitation noted with ROM b/l. Hallux  valgus with bunion deformity noted 2-5 bilaterally. Hammertoes noted to the 2-5 bilaterally.  Assessment and Plan:  1. Pain due to onychomycosis of toenails of both feet   2. Hallux valgus, acquired, bilateral   3. Acquired hammertoes of both feet   4. PVD (peripheral vascular disease) ([place])    -Examined patient. -Toenails 1-5 b/l were debrided in  length and girth with sterile nail nippers and dremel without iatrogenic bleeding.  -Patient to report any pedal injuries to medical professional immediately. -Dispensed toe separators for 1st webspace of right foot. Apply every morning. Remove every evening. -Patient to continue soft, supportive shoe gear daily. -Patient/POA to call should there be question/concern in the interim.  Return in about 16 weeks (around 08/20/2020).  Marzetta Board, DPM

## 2020-05-08 DIAGNOSIS — M6281 Muscle weakness (generalized): Secondary | ICD-10-CM | POA: Diagnosis not present

## 2020-05-08 DIAGNOSIS — R2689 Other abnormalities of gait and mobility: Secondary | ICD-10-CM | POA: Diagnosis not present

## 2020-05-08 DIAGNOSIS — R2681 Unsteadiness on feet: Secondary | ICD-10-CM | POA: Diagnosis not present

## 2020-05-08 DIAGNOSIS — H8112 Benign paroxysmal vertigo, left ear: Secondary | ICD-10-CM | POA: Diagnosis not present

## 2020-05-11 DIAGNOSIS — H8112 Benign paroxysmal vertigo, left ear: Secondary | ICD-10-CM | POA: Diagnosis not present

## 2020-05-11 DIAGNOSIS — R2681 Unsteadiness on feet: Secondary | ICD-10-CM | POA: Diagnosis not present

## 2020-05-11 DIAGNOSIS — M6281 Muscle weakness (generalized): Secondary | ICD-10-CM | POA: Diagnosis not present

## 2020-05-11 DIAGNOSIS — R2689 Other abnormalities of gait and mobility: Secondary | ICD-10-CM | POA: Diagnosis not present

## 2020-05-15 ENCOUNTER — Other Ambulatory Visit: Payer: Self-pay

## 2020-05-15 ENCOUNTER — Ambulatory Visit (INDEPENDENT_AMBULATORY_CARE_PROVIDER_SITE_OTHER): Payer: Medicare Other | Admitting: Orthotics

## 2020-05-15 DIAGNOSIS — M2042 Other hammer toe(s) (acquired), left foot: Secondary | ICD-10-CM

## 2020-05-15 DIAGNOSIS — M2012 Hallux valgus (acquired), left foot: Secondary | ICD-10-CM | POA: Diagnosis not present

## 2020-05-15 DIAGNOSIS — H8112 Benign paroxysmal vertigo, left ear: Secondary | ICD-10-CM | POA: Diagnosis not present

## 2020-05-15 DIAGNOSIS — R2681 Unsteadiness on feet: Secondary | ICD-10-CM

## 2020-05-15 DIAGNOSIS — M2011 Hallux valgus (acquired), right foot: Secondary | ICD-10-CM | POA: Diagnosis not present

## 2020-05-15 DIAGNOSIS — M2041 Other hammer toe(s) (acquired), right foot: Secondary | ICD-10-CM | POA: Diagnosis not present

## 2020-05-15 DIAGNOSIS — R2689 Other abnormalities of gait and mobility: Secondary | ICD-10-CM | POA: Diagnosis not present

## 2020-05-15 DIAGNOSIS — M6281 Muscle weakness (generalized): Secondary | ICD-10-CM | POA: Diagnosis not present

## 2020-05-15 DIAGNOSIS — M79675 Pain in left toe(s): Secondary | ICD-10-CM

## 2020-05-15 DIAGNOSIS — M48061 Spinal stenosis, lumbar region without neurogenic claudication: Secondary | ICD-10-CM | POA: Diagnosis not present

## 2020-05-15 DIAGNOSIS — Z8673 Personal history of transient ischemic attack (TIA), and cerebral infarction without residual deficits: Secondary | ICD-10-CM

## 2020-05-15 DIAGNOSIS — B351 Tinea unguium: Secondary | ICD-10-CM

## 2020-05-19 DIAGNOSIS — H8112 Benign paroxysmal vertigo, left ear: Secondary | ICD-10-CM | POA: Diagnosis not present

## 2020-05-19 DIAGNOSIS — R2689 Other abnormalities of gait and mobility: Secondary | ICD-10-CM | POA: Diagnosis not present

## 2020-05-19 DIAGNOSIS — M6281 Muscle weakness (generalized): Secondary | ICD-10-CM | POA: Diagnosis not present

## 2020-05-19 DIAGNOSIS — R2681 Unsteadiness on feet: Secondary | ICD-10-CM | POA: Diagnosis not present

## 2020-05-26 ENCOUNTER — Other Ambulatory Visit: Payer: Medicare Other | Admitting: Orthotics

## 2020-05-26 ENCOUNTER — Other Ambulatory Visit: Payer: Self-pay

## 2020-06-01 DIAGNOSIS — Z6829 Body mass index (BMI) 29.0-29.9, adult: Secondary | ICD-10-CM | POA: Diagnosis not present

## 2020-06-01 DIAGNOSIS — J0120 Acute ethmoidal sinusitis, unspecified: Secondary | ICD-10-CM | POA: Diagnosis not present

## 2020-06-17 DIAGNOSIS — E2689 Other hyperaldosteronism: Secondary | ICD-10-CM | POA: Diagnosis not present

## 2020-06-17 DIAGNOSIS — E2681 Bartter's syndrome: Secondary | ICD-10-CM | POA: Diagnosis not present

## 2020-06-17 DIAGNOSIS — M6281 Muscle weakness (generalized): Secondary | ICD-10-CM | POA: Diagnosis not present

## 2020-06-17 DIAGNOSIS — H8112 Benign paroxysmal vertigo, left ear: Secondary | ICD-10-CM | POA: Diagnosis not present

## 2020-06-19 DIAGNOSIS — H8112 Benign paroxysmal vertigo, left ear: Secondary | ICD-10-CM | POA: Diagnosis not present

## 2020-06-19 DIAGNOSIS — E2681 Bartter's syndrome: Secondary | ICD-10-CM | POA: Diagnosis not present

## 2020-06-19 DIAGNOSIS — E2689 Other hyperaldosteronism: Secondary | ICD-10-CM | POA: Diagnosis not present

## 2020-06-19 DIAGNOSIS — M6281 Muscle weakness (generalized): Secondary | ICD-10-CM | POA: Diagnosis not present

## 2020-06-22 DIAGNOSIS — R2689 Other abnormalities of gait and mobility: Secondary | ICD-10-CM | POA: Diagnosis not present

## 2020-06-22 DIAGNOSIS — H8112 Benign paroxysmal vertigo, left ear: Secondary | ICD-10-CM | POA: Diagnosis not present

## 2020-06-22 DIAGNOSIS — R2681 Unsteadiness on feet: Secondary | ICD-10-CM | POA: Diagnosis not present

## 2020-06-22 DIAGNOSIS — M6281 Muscle weakness (generalized): Secondary | ICD-10-CM | POA: Diagnosis not present

## 2020-07-01 DIAGNOSIS — H8112 Benign paroxysmal vertigo, left ear: Secondary | ICD-10-CM | POA: Diagnosis not present

## 2020-07-01 DIAGNOSIS — M6281 Muscle weakness (generalized): Secondary | ICD-10-CM | POA: Diagnosis not present

## 2020-07-01 DIAGNOSIS — R2689 Other abnormalities of gait and mobility: Secondary | ICD-10-CM | POA: Diagnosis not present

## 2020-07-01 DIAGNOSIS — R2681 Unsteadiness on feet: Secondary | ICD-10-CM | POA: Diagnosis not present

## 2020-07-06 DIAGNOSIS — R2681 Unsteadiness on feet: Secondary | ICD-10-CM | POA: Diagnosis not present

## 2020-07-06 DIAGNOSIS — R2689 Other abnormalities of gait and mobility: Secondary | ICD-10-CM | POA: Diagnosis not present

## 2020-07-06 DIAGNOSIS — H8112 Benign paroxysmal vertigo, left ear: Secondary | ICD-10-CM | POA: Diagnosis not present

## 2020-07-06 DIAGNOSIS — M6281 Muscle weakness (generalized): Secondary | ICD-10-CM | POA: Diagnosis not present

## 2020-07-14 DIAGNOSIS — M6281 Muscle weakness (generalized): Secondary | ICD-10-CM | POA: Diagnosis not present

## 2020-07-14 DIAGNOSIS — H8112 Benign paroxysmal vertigo, left ear: Secondary | ICD-10-CM | POA: Diagnosis not present

## 2020-07-14 DIAGNOSIS — R2681 Unsteadiness on feet: Secondary | ICD-10-CM | POA: Diagnosis not present

## 2020-07-14 DIAGNOSIS — R2689 Other abnormalities of gait and mobility: Secondary | ICD-10-CM | POA: Diagnosis not present

## 2020-07-16 DIAGNOSIS — M6281 Muscle weakness (generalized): Secondary | ICD-10-CM | POA: Diagnosis not present

## 2020-07-16 DIAGNOSIS — R2681 Unsteadiness on feet: Secondary | ICD-10-CM | POA: Diagnosis not present

## 2020-07-16 DIAGNOSIS — H8112 Benign paroxysmal vertigo, left ear: Secondary | ICD-10-CM | POA: Diagnosis not present

## 2020-07-16 DIAGNOSIS — R2689 Other abnormalities of gait and mobility: Secondary | ICD-10-CM | POA: Diagnosis not present

## 2020-07-21 ENCOUNTER — Telehealth: Payer: Self-pay | Admitting: Sports Medicine

## 2020-07-21 DIAGNOSIS — E039 Hypothyroidism, unspecified: Secondary | ICD-10-CM | POA: Diagnosis not present

## 2020-07-21 DIAGNOSIS — I10 Essential (primary) hypertension: Secondary | ICD-10-CM | POA: Diagnosis not present

## 2020-07-21 DIAGNOSIS — E785 Hyperlipidemia, unspecified: Secondary | ICD-10-CM | POA: Diagnosis not present

## 2020-07-21 NOTE — Telephone Encounter (Signed)
Pt left message last week that she had contacted [employer] and they have the tan shoes she wanted in stock. She is getting frustrated that we cannot get her the shoes she wants. She asked me to call [employer] and talk with [place] @[phone].   She left message again today and I returned call and explained that we have canceled all previous orders for the tan and the black shoe and re ordered both to see what we can get in first. She really wants the tan ones as she has purchased online a pr of balance shoes and new balance shoes to go with the braces. I told pt I have been keeping an eye on her order and will let her know when we get the shoes in.

## 2020-08-04 ENCOUNTER — Other Ambulatory Visit: Payer: Medicare Other | Admitting: Orthotics

## 2020-08-04 ENCOUNTER — Other Ambulatory Visit: Payer: Self-pay

## 2020-08-12 NOTE — Progress Notes (Signed)
Patient came in today to pick up Moore Balance Brace (Bilateral).   Patient was able to don brace independently and brace was check for fit to custom.   Patient was observed walking with brace and gait was improved as well as stability.   Patient was advised of care and wearing instructions.  Advised to notify practice if there were any issues; especially skin irritation.  

## 2020-08-15 ENCOUNTER — Inpatient Hospital Stay (HOSPITAL_COMMUNITY)
Admission: EM | Admit: 2020-08-15 | Discharge: 2020-08-27 | DRG: 064 | Disposition: A | Discharge status: Rehabilitation Facility | Payer: Medicare Other | Attending: Internal Medicine | Admitting: Internal Medicine

## 2020-08-15 ENCOUNTER — Inpatient Hospital Stay (HOSPITAL_COMMUNITY): Payer: Medicare Other

## 2020-08-15 ENCOUNTER — Emergency Department (HOSPITAL_COMMUNITY): Payer: Medicare Other

## 2020-08-15 DIAGNOSIS — Z7982 Long term (current) use of aspirin: Secondary | ICD-10-CM

## 2020-08-15 DIAGNOSIS — B965 Pseudomonas (aeruginosa) (mallei) (pseudomallei) as the cause of diseases classified elsewhere: Secondary | ICD-10-CM | POA: Diagnosis not present

## 2020-08-15 DIAGNOSIS — I619 Nontraumatic intracerebral hemorrhage, unspecified: Secondary | ICD-10-CM

## 2020-08-15 DIAGNOSIS — E876 Hypokalemia: Secondary | ICD-10-CM

## 2020-08-15 DIAGNOSIS — E7849 Other hyperlipidemia: Secondary | ICD-10-CM | POA: Diagnosis not present

## 2020-08-15 DIAGNOSIS — E039 Hypothyroidism, unspecified: Secondary | ICD-10-CM | POA: Diagnosis not present

## 2020-08-15 DIAGNOSIS — N390 Urinary tract infection, site not specified: Secondary | ICD-10-CM | POA: Diagnosis not present

## 2020-08-15 DIAGNOSIS — I611 Nontraumatic intracerebral hemorrhage in hemisphere, cortical: Principal | ICD-10-CM | POA: Diagnosis present

## 2020-08-15 DIAGNOSIS — I639 Cerebral infarction, unspecified: Secondary | ICD-10-CM | POA: Diagnosis not present

## 2020-08-15 DIAGNOSIS — Z8049 Family history of malignant neoplasm of other genital organs: Secondary | ICD-10-CM

## 2020-08-15 DIAGNOSIS — Z781 Physical restraint status: Secondary | ICD-10-CM

## 2020-08-15 DIAGNOSIS — R1311 Dysphagia, oral phase: Secondary | ICD-10-CM | POA: Diagnosis present

## 2020-08-15 DIAGNOSIS — H5347 Heteronymous bilateral field defects: Secondary | ICD-10-CM | POA: Diagnosis present

## 2020-08-15 DIAGNOSIS — R451 Restlessness and agitation: Secondary | ICD-10-CM | POA: Diagnosis not present

## 2020-08-15 DIAGNOSIS — E785 Hyperlipidemia, unspecified: Secondary | ICD-10-CM | POA: Diagnosis present

## 2020-08-15 DIAGNOSIS — I69120 Aphasia following nontraumatic intracerebral hemorrhage: Secondary | ICD-10-CM | POA: Diagnosis not present

## 2020-08-15 DIAGNOSIS — E870 Hyperosmolality and hypernatremia: Secondary | ICD-10-CM | POA: Diagnosis not present

## 2020-08-15 DIAGNOSIS — R42 Dizziness and giddiness: Secondary | ICD-10-CM | POA: Diagnosis not present

## 2020-08-15 DIAGNOSIS — R110 Nausea: Secondary | ICD-10-CM | POA: Diagnosis not present

## 2020-08-15 DIAGNOSIS — I10 Essential (primary) hypertension: Secondary | ICD-10-CM | POA: Diagnosis present

## 2020-08-15 DIAGNOSIS — Z87440 Personal history of urinary (tract) infections: Secondary | ICD-10-CM | POA: Diagnosis not present

## 2020-08-15 DIAGNOSIS — R0989 Other specified symptoms and signs involving the circulatory and respiratory systems: Secondary | ICD-10-CM | POA: Diagnosis not present

## 2020-08-15 DIAGNOSIS — Z96653 Presence of artificial knee joint, bilateral: Secondary | ICD-10-CM | POA: Diagnosis not present

## 2020-08-15 DIAGNOSIS — Z79899 Other long term (current) drug therapy: Secondary | ICD-10-CM | POA: Diagnosis not present

## 2020-08-15 DIAGNOSIS — R7303 Prediabetes: Secondary | ICD-10-CM | POA: Diagnosis not present

## 2020-08-15 DIAGNOSIS — Z79890 Hormone replacement therapy (postmenopausal): Secondary | ICD-10-CM

## 2020-08-15 DIAGNOSIS — L89151 Pressure ulcer of sacral region, stage 1: Secondary | ICD-10-CM | POA: Diagnosis not present

## 2020-08-15 DIAGNOSIS — I4460 Unspecified fascicular block: Secondary | ICD-10-CM | POA: Diagnosis not present

## 2020-08-15 DIAGNOSIS — Z823 Family history of stroke: Secondary | ICD-10-CM

## 2020-08-15 DIAGNOSIS — I610 Nontraumatic intracerebral hemorrhage in hemisphere, subcortical: Secondary | ICD-10-CM | POA: Diagnosis not present

## 2020-08-15 DIAGNOSIS — Z20822 Contact with and (suspected) exposure to covid-19: Secondary | ICD-10-CM | POA: Diagnosis not present

## 2020-08-15 DIAGNOSIS — Z803 Family history of malignant neoplasm of breast: Secondary | ICD-10-CM

## 2020-08-15 DIAGNOSIS — S0003XA Contusion of scalp, initial encounter: Secondary | ICD-10-CM | POA: Diagnosis not present

## 2020-08-15 DIAGNOSIS — Z66 Do not resuscitate: Secondary | ICD-10-CM | POA: Diagnosis present

## 2020-08-15 DIAGNOSIS — I612 Nontraumatic intracerebral hemorrhage in hemisphere, unspecified: Secondary | ICD-10-CM | POA: Diagnosis not present

## 2020-08-15 DIAGNOSIS — Z853 Personal history of malignant neoplasm of breast: Secondary | ICD-10-CM

## 2020-08-15 DIAGNOSIS — I6389 Other cerebral infarction: Secondary | ICD-10-CM | POA: Diagnosis not present

## 2020-08-15 DIAGNOSIS — R4702 Dysphasia: Secondary | ICD-10-CM | POA: Diagnosis not present

## 2020-08-15 DIAGNOSIS — Z881 Allergy status to other antibiotic agents status: Secondary | ICD-10-CM

## 2020-08-15 DIAGNOSIS — G319 Degenerative disease of nervous system, unspecified: Secondary | ICD-10-CM | POA: Diagnosis not present

## 2020-08-15 DIAGNOSIS — Z23 Encounter for immunization: Secondary | ICD-10-CM | POA: Diagnosis not present

## 2020-08-15 DIAGNOSIS — D72829 Elevated white blood cell count, unspecified: Secondary | ICD-10-CM | POA: Diagnosis not present

## 2020-08-15 DIAGNOSIS — I680 Cerebral amyloid angiopathy: Secondary | ICD-10-CM | POA: Diagnosis not present

## 2020-08-15 DIAGNOSIS — F411 Generalized anxiety disorder: Secondary | ICD-10-CM | POA: Diagnosis not present

## 2020-08-15 DIAGNOSIS — Z96611 Presence of right artificial shoulder joint: Secondary | ICD-10-CM | POA: Diagnosis not present

## 2020-08-15 DIAGNOSIS — S06360A Traumatic hemorrhage of cerebrum, unspecified, without loss of consciousness, initial encounter: Secondary | ICD-10-CM | POA: Diagnosis not present

## 2020-08-15 DIAGNOSIS — I618 Other nontraumatic intracerebral hemorrhage: Secondary | ICD-10-CM | POA: Diagnosis not present

## 2020-08-15 DIAGNOSIS — R140 Abdominal distension (gaseous): Secondary | ICD-10-CM | POA: Diagnosis not present

## 2020-08-15 DIAGNOSIS — G936 Cerebral edema: Secondary | ICD-10-CM | POA: Diagnosis present

## 2020-08-15 DIAGNOSIS — R58 Hemorrhage, not elsewhere classified: Secondary | ICD-10-CM | POA: Diagnosis not present

## 2020-08-15 DIAGNOSIS — R7989 Other specified abnormal findings of blood chemistry: Secondary | ICD-10-CM | POA: Diagnosis present

## 2020-08-15 DIAGNOSIS — Z8249 Family history of ischemic heart disease and other diseases of the circulatory system: Secondary | ICD-10-CM

## 2020-08-15 DIAGNOSIS — R4701 Aphasia: Secondary | ICD-10-CM | POA: Diagnosis present

## 2020-08-15 DIAGNOSIS — N289 Disorder of kidney and ureter, unspecified: Secondary | ICD-10-CM

## 2020-08-15 DIAGNOSIS — F32A Depression, unspecified: Secondary | ICD-10-CM | POA: Diagnosis not present

## 2020-08-15 DIAGNOSIS — S0083XA Contusion of other part of head, initial encounter: Secondary | ICD-10-CM | POA: Diagnosis not present

## 2020-08-15 LAB — COMPREHENSIVE METABOLIC PANEL
ALT: 32 U/L (ref 0–44)
AST: 43 U/L — ABNORMAL HIGH (ref 15–41)
Albumin: 3.9 g/dL (ref 3.5–5.0)
Alkaline Phosphatase: 89 U/L (ref 38–126)
Anion gap: 12 (ref 5–15)
BUN: 10 mg/dL (ref 8–23)
CO2: 24 mmol/L (ref 22–32)
Calcium: 9.3 mg/dL (ref 8.9–10.3)
Chloride: 106 mmol/L (ref 98–111)
Creatinine, Ser: 0.73 mg/dL (ref 0.44–1.00)
GFR calc Af Amer: >60 mL/min (ref >60)
GFR calc non Af Amer: >60 mL/min (ref >60)
Glucose, Bld: 115 mg/dL — ABNORMAL HIGH (ref 70–99)
Potassium: 3.1 mmol/L — ABNORMAL LOW (ref 3.5–5.1)
Sodium: 142 mmol/L (ref 135–145)
Total Bilirubin: 1.5 mg/dL — ABNORMAL HIGH (ref 0.3–1.2)
Total Protein: 7.3 g/dL (ref 6.5–8.1)

## 2020-08-15 LAB — CBC
HCT: 45 % (ref 36.0–46.0)
Hemoglobin: 14.9 g/dL (ref 12.0–15.0)
MCH: 32.7 pg (ref 26.0–34.0)
MCHC: 33.1 g/dL (ref 30.0–36.0)
MCV: 98.7 fL (ref 80.0–100.0)
Platelets: 218 10*3/uL (ref 150–400)
RBC: 4.56 MIL/uL (ref 3.87–5.11)
RDW: 12.7 % (ref 11.5–15.5)
WBC: 7.8 10*3/uL (ref 4.0–10.5)
nRBC: 0 % (ref 0.0–0.2)

## 2020-08-15 LAB — CBG MONITORING, ED: Glucose-Capillary: 116 mg/dL — ABNORMAL HIGH (ref 70–99)

## 2020-08-15 LAB — I-STAT CHEM 8, ED
BUN: 11 mg/dL (ref 8–23)
Calcium, Ion: 0.94 mmol/L — ABNORMAL LOW (ref 1.15–1.40)
Chloride: 107 mmol/L (ref 98–111)
Creatinine, Ser: 0.6 mg/dL (ref 0.44–1.00)
Glucose, Bld: 111 mg/dL — ABNORMAL HIGH (ref 70–99)
HCT: 44 % (ref 36.0–46.0)
Hemoglobin: 15 g/dL (ref 12.0–15.0)
Potassium: 3.1 mmol/L — ABNORMAL LOW (ref 3.5–5.1)
Sodium: 140 mmol/L (ref 135–145)
TCO2: 22 mmol/L (ref 22–32)

## 2020-08-15 LAB — HEMOGLOBIN A1C
Hgb A1c MFr Bld: 5.5 % (ref 4.8–5.6)
Mean Plasma Glucose: 111.15 mg/dL

## 2020-08-15 LAB — PROTIME-INR
INR: 1.1 (ref 0.8–1.2)
Prothrombin Time: 13.6 seconds (ref 11.4–15.2)

## 2020-08-15 LAB — APTT: aPTT: 27 seconds (ref 24–36)

## 2020-08-15 MED ORDER — PANTOPRAZOLE SODIUM 40 MG IV SOLR
40.0000 mg | Freq: Every day | INTRAVENOUS | Status: DC
  Administered 2020-08-16 – 2020-08-20 (×6): 40 mg via INTRAVENOUS
  Filled 2020-08-15 (×6): qty 40

## 2020-08-15 MED ORDER — FENTANYL CITRATE (PF) 100 MCG/2ML IJ SOLN
12.5000 ug | Freq: Once | INTRAMUSCULAR | Status: AC
  Administered 2020-08-15: 12.5 ug via INTRAVENOUS
  Filled 2020-08-15: qty 2

## 2020-08-15 MED ORDER — STROKE: EARLY STAGES OF RECOVERY BOOK
Freq: Once | Status: DC
  Filled 2020-08-15: qty 1

## 2020-08-15 MED ORDER — SENNOSIDES-DOCUSATE SODIUM 8.6-50 MG PO TABS
1.0000 | ORAL_TABLET | Freq: Two times a day (BID) | ORAL | Status: DC
  Administered 2020-08-21 – 2020-08-27 (×11): 1 via ORAL
  Filled 2020-08-15 (×14): qty 1

## 2020-08-15 MED ORDER — ACETAMINOPHEN 650 MG RE SUPP
650.0000 mg | RECTAL | Status: DC | PRN
  Administered 2020-08-16 – 2020-08-17 (×3): 650 mg via RECTAL
  Filled 2020-08-15 (×3): qty 1

## 2020-08-15 MED ORDER — LORAZEPAM 2 MG/ML IJ SOLN
INTRAMUSCULAR | Status: AC
  Filled 2020-08-15: qty 1

## 2020-08-15 MED ORDER — LORAZEPAM 2 MG/ML IJ SOLN
1.0000 mg | Freq: Once | INTRAMUSCULAR | Status: AC
  Administered 2020-08-15: 1 mg via INTRAVENOUS

## 2020-08-15 MED ORDER — ACETAMINOPHEN 160 MG/5ML PO SOLN: 650.0000 mg | ORAL | Status: DC | PRN

## 2020-08-15 MED ORDER — ACETAMINOPHEN 325 MG PO TABS
650.0000 mg | ORAL_TABLET | ORAL | Status: DC | PRN
  Administered 2020-08-22: 650 mg via ORAL
  Filled 2020-08-15 (×2): qty 2

## 2020-08-15 MED ORDER — CLEVIDIPINE BUTYRATE 0.5 MG/ML IV EMUL
0.0000 mg/h | INTRAVENOUS | Status: DC
  Administered 2020-08-15: 1 mg/h via INTRAVENOUS
  Administered 2020-08-16: 21 mg/h via INTRAVENOUS
  Administered 2020-08-16: 17 mg/h via INTRAVENOUS
  Administered 2020-08-16: 8 mg/h via INTRAVENOUS
  Administered 2020-08-16: 17 mg/h via INTRAVENOUS
  Administered 2020-08-16: 16 mg/h via INTRAVENOUS
  Administered 2020-08-16: 17 mg/h via INTRAVENOUS
  Administered 2020-08-17: 21 mg/h via INTRAVENOUS
  Administered 2020-08-17: 7 mg/h via INTRAVENOUS
  Administered 2020-08-17: 1 mg/h via INTRAVENOUS
  Administered 2020-08-17: 18 mg/h via INTRAVENOUS
  Administered 2020-08-18: 21 mg/h via INTRAVENOUS
  Administered 2020-08-18 (×2): 18 mg/h via INTRAVENOUS
  Administered 2020-08-18 (×3): 21 mg/h via INTRAVENOUS
  Administered 2020-08-18 (×3): 18 mg/h via INTRAVENOUS
  Administered 2020-08-19: 21 mg/h via INTRAVENOUS
  Administered 2020-08-19: 18 mg/h via INTRAVENOUS
  Administered 2020-08-19: 8 mg/h via INTRAVENOUS
  Administered 2020-08-19: 7 mg/h via INTRAVENOUS
  Administered 2020-08-20: 5 mg/h via INTRAVENOUS
  Filled 2020-08-15 (×11): qty 50
  Filled 2020-08-15: qty 100
  Filled 2020-08-15 (×3): qty 50
  Filled 2020-08-15 (×3): qty 100
  Filled 2020-08-15 (×3): qty 50
  Filled 2020-08-15: qty 100

## 2020-08-15 NOTE — H&P (Addendum)
Admission H&P    Chief Complaint: Acute onset of expressive aphasia  HPI: Tara [name] is an 84 y.o. female with a PMHx of right breast CA, HTN, hypothyroidism, pre-diabetes, recurrent UTI and prior stroke who presents via EMS to the ED from her assisted living facility as a Code Stroke. LKN was 1900 when family spoke to her over the telephone. When she was checked on by someone at her facility later in the evening, she was noted to be aphasic and EMS was called. BP en route was 226/115. On arrival to the ED she exhibited receptive and expressive aphasia but could communicate enough to state that she had a severe headache. She also was clutching her head in pain, which became more frequent during the assessment.   STAT head CT revealed a left parieto-occipital acute ICH.   Home medications include ASA, atorvastatin and levothyroxine. She is not on a blood thinner.   Past Medical History:  Diagnosis Date   Arthritis    knees   Breast cancer ([place]) 09/30/15   right breast   Dependent edema    Depression    just lost spouse in Nov. 2018   Essential hypertension, benign 04/15/2011   Herniated lumbar disc without myelopathy    Hx of staphylococcal infection    Hypertension    Hypothyroidism    Obesity    Pre-diabetes    Radicular pain    Recurrent UTI    Stroke [facility])    ' mini"   Thyroid disease    hypothyroidism   Vertigo    Wears contact lenses    one    Past Surgical History:  Procedure Laterality Date   ANKLE SURGERY  1994   RIGHT   BACK SURGERY     BREAST LUMPECTOMY WITH NEEDLE LOCALIZATION Right 09/30/2015   Procedure: RIGHT BREAST LUMPECTOMY WITH NEEDLE LOCALIZATION;  Surgeon: Fanny Skates, MD;  Location: [place];  Service: General;  Laterality: Right;   BREAST SURGERY     COLONOSCOPY W/ BIOPSIES AND POLYPECTOMY     EYE SURGERY     bilateral   HAND SURGERY  1951   RIGHT HAND   JOINT REPLACEMENT     rt shoulder rotator  cuff   KNEE ARTHROSCOPY     left knee   LUMBAR LAMINECTOMY/DECOMPRESSION MICRODISCECTOMY Right 01/11/2013   Procedure: LUMBAR LAMINECTOMY/DECOMPRESSION MICRODISCECTOMY 1 LEVEL,Right Lumbar Three-Four;  Surgeon: Elaina Hoops, MD;  Location: [facility];  Service: Neurosurgery;  Laterality: Right;   ROTATOR CUFF REPAIR  2011   RIGHT   TONSILLECTOMY     TOTAL KNEE ARTHROPLASTY     Left   TOTAL KNEE ARTHROPLASTY Right 12/25/2017   Procedure: TOTAL KNEE ARTHROPLASTY;  Surgeon: Vickey Huger, MD;  Location: [place];  Service: Orthopedics;  Laterality: Right;    Family History  Problem Relation Age of Onset   Breast cancer Sister    Stroke Sister    Cancer Brother        Kidney   Heart failure Father    Uterine cancer Sister    Breast cancer Sister        Uterine   Cancer Brother        Lung   Social History:  reports that she has never smoked. She has never used smokeless tobacco. She reports that she does not drink alcohol and does not use drugs.  Allergies:  Allergies  Allergen Reactions   Ceclor [Cefaclor] Hives and Itching   Home Medications:  No current facility-administered medications on file prior to encounter.   Current Outpatient Medications on File Prior to Encounter  Medication Sig Dispense Refill   acetaminophen (TYLENOL) 325 MG tablet Take 2 tablets (650 mg total) by mouth every 4 (four) hours as needed for mild pain ((score 1 to 3) or temp > 100.5).     amLODipine (NORVASC) 10 MG tablet Take 1 tablet (10 mg total) by mouth daily. 30 tablet 1   aspirin EC 81 MG tablet Take 81 mg by mouth daily.     atorvastatin (LIPITOR) 40 MG tablet Take 1 tablet (40 mg total) by mouth daily. 90 tablet 3   Cholecalciferol 50 MCG (2000 UT) CAPS Take 1 capsule (2,000 Units total) by mouth daily. 30 capsule 1   diazepam (VALIUM) 5 MG tablet Take 1 tablet (5 mg total) by mouth 2 (two) times daily as needed for anxiety.     levothyroxine (SYNTHROID) 75 MCG tablet Take 1  tablet (75 mcg total) by mouth daily. 90 tablet 0   losartan (COZAAR) 25 MG tablet Take 25 mg by mouth daily.      ROS: Unable to obtain due to aphasia.   Physical Examination: Blood pressure (!) 226/115, pulse 77, weight 73.5 kg.  HEENT-  [place]/AT.  Neck supple. Lungs - Respirations unlabored Extremities - No edema  Neurologic Examination: Mental Status: Awake with decreased alertness. Speech comprehension is impaired. Dysfluent speech output with frequent word-salad responses to questions. Some semantic paraphasias are noted. She cannot answer any of 5 orientation questions, appearing confused and distressed when attempting to answer. Speech is nondysarthric. Severe naming deficit. Difficulty following most commands.  Cranial Nerves: II:  Blinks to threat on the left but not on the right. PERRL.  III,IV, VI: No ptosis. Gaze is deviated conjugately to the left of midline. Cannot track past the midline to the right. This can be overcome with oculocephalic maneuver. No nystagmus.  V,VII: Subtly decreased NL fold on the right.  VIII: Hearing intact to voice IX,X: No hypophonia XI: Head preferentially rotated to the left.  XII: Midline tongue extension  Motor: RUE 4/5 with volitional movements against resistance, but drops to bed rapidly after being passively raised.  RLE 4/5 LUE and LLE 5/5 Sensory: Decreased responses to sensory stimuli on the right, but will react to pinch.  Deep Tendon Reflexes:  1+ bilateral brachioradialis. 0 left patella (prior surgery) 1+ right patella Plantars: Right: upgoing   Left: upgoing Cerebellar: Slow FNF on the left with past pointing as well as tremor. Unable to assess FNF on the right.  Gait: Unable to assess  Results for orders placed or performed during the hospital encounter of 08/15/20 (from the past 48 hour(s))  CBG monitoring, ED     Status: Abnormal   Collection Time: 08/15/20 10:59 PM  Result Value Ref Range   Glucose-Capillary 116 (H) 70  - 99 mg/dL    Comment: Glucose reference range applies only to samples taken after fasting for at least 8 hours.  I-stat chem 8, ed     Status: Abnormal   Collection Time: 08/15/20 11:10 PM  Result Value Ref Range   Sodium 140 135 - 145 mmol/L   Potassium 3.1 (L) 3.5 - 5.1 mmol/L   Chloride 107 98 - 111 mmol/L   BUN 11 8 - 23 mg/dL   Creatinine, Ser 0.60 0.44 - 1.00 mg/dL   Glucose, Bld 111 (H) 70 - 99 mg/dL    Comment: Glucose reference range applies only  to samples taken after fasting for at least 8 hours.   Calcium, Ion 0.94 (L) 1.15 - 1.40 mmol/L   TCO2 22 22 - 32 mmol/L   Hemoglobin 15.0 12.0 - 15.0 g/dL   HCT 44.0 36 - 46 %   No results found.   Assessment:  84 year old female presenting with acute onset of headache and aphasia 1. Exam reveals expressive aphasia.  2. CT head reveals an acute left parietal lobe ICH. The appearance is suggestive of a possible underlying lesion.  3. ICH score: 1 4. Per family, the patient has bad reactions to some "pain medications" in the past, including disorientation, agitation and delusions. She became agitated in the ED after administration of fentanyl for her headache.   Recommendations: 1. Admit to ICU under Neurology service 2. MRI/MRA of head 3. PT consult, OT consult, Speech consult 4. Frequent neuro checks 5. BP management with clevidipine drip  6. No antiplatelet medications or anticoagulants. DVT prophylaxis with SCDs 7. The patient is DNR per family. Do not resuscitate orders have been placed. 8. TSH level.  9. Restarting levothyroxine.  10. Repeat CT head in 24 hours.  11. Cardiac telemetry 12. Phenergan 12.5 mg IV x 1 13. Family contact: Doyle Askew (daughter) at [phone]  55 minutes spent in the emergent neurological evaluation and management of this critically ill patient.   Electronically signed: Dr. Kerney Elbe 08/15/2020, 11:14 PM

## 2020-08-15 NOTE — ED Triage Notes (Signed)
Pt came in EMS from home. EMS called code stroke d/t pt having expressive aphasia. Per pt family pt was last verbally talked to over phone around 1900. Pt had no speech issues at that time. Pt was lsk around 2000 on 08/14/2020.

## 2020-08-15 NOTE — ED Provider Notes (Signed)
[facility] Emergency Department Provider Note MRN:  [id number]  Arrival date & time: 08/15/20     Chief Complaint   No chief complaint on file.   History of Present Illness   Tara [name] is a 84 y.o. year-old female with a history of stroke presenting to the ED with chief complaint of code stroke.  Last known well at 7pm, was talking on the phone with daughter and sounded normal, at baseline.  Concern for expressive aphasia with EMS, code stroke called en route.  No obvious motor deficits.    Review of Systems  Positive for speech disturbance.  Patient's Health History    Past Medical History:  Diagnosis Date  . Arthritis    knees  . Breast cancer ([place]) 09/30/15   right breast  . Dependent edema   . Depression    just lost spouse in Nov. 2018  . Essential hypertension, benign 04/15/2011  . Herniated lumbar disc without myelopathy   . Hx of staphylococcal infection   . Hypertension   . Hypothyroidism   . Obesity   . Pre-diabetes   . Radicular pain   . Recurrent UTI   . Stroke [facility])    ' mini"  . Thyroid disease    hypothyroidism  . Vertigo   . Wears contact lenses    one    Past Surgical History:  Procedure Laterality Date  . ANKLE SURGERY  1994   RIGHT  . BACK SURGERY    . BREAST LUMPECTOMY WITH NEEDLE LOCALIZATION Right 09/30/2015   Procedure: RIGHT BREAST LUMPECTOMY WITH NEEDLE LOCALIZATION;  Surgeon: Fanny Skates, MD;  Location: [place];  Service: General;  Laterality: Right;  . BREAST SURGERY    . COLONOSCOPY W/ BIOPSIES AND POLYPECTOMY    . EYE SURGERY     bilateral  . HAND SURGERY  1951   RIGHT HAND  . JOINT REPLACEMENT     rt shoulder rotator cuff  . KNEE ARTHROSCOPY     left knee  . LUMBAR LAMINECTOMY/DECOMPRESSION MICRODISCECTOMY Right 01/11/2013   Procedure: LUMBAR LAMINECTOMY/DECOMPRESSION MICRODISCECTOMY 1 LEVEL,Right Lumbar Three-Four;  Surgeon: Elaina Hoops, MD;  Location: [place] [facility];   Service: Neurosurgery;  Laterality: Right;  . ROTATOR CUFF REPAIR  2011   RIGHT  . TONSILLECTOMY    . TOTAL KNEE ARTHROPLASTY     Left  . TOTAL KNEE ARTHROPLASTY Right 12/25/2017   Procedure: TOTAL KNEE ARTHROPLASTY;  Surgeon: Vickey Huger, MD;  Location: [place];  Service: Orthopedics;  Laterality: Right;    Family History  Problem Relation Age of Onset  . Breast cancer Sister   . Stroke Sister   . Cancer Brother        Kidney  . Heart failure Father   . Uterine cancer Sister   . Breast cancer Sister        Uterine  . Cancer Brother        Lung    Social History   Socioeconomic History  . Marital status: Widowed    Spouse name: Not on file  . Number of children: 2  . Years of education: 46  . Highest education level: High school graduate  Occupational History  . Occupation: Retired  Tobacco Use  . Smoking status: Never Smoker  . Smokeless tobacco: Never Used  Vaping Use  . Vaping Use: Never used  Substance and Sexual Activity  . Alcohol use: No  . Drug use: No  . Sexual activity: Never  Birth control/protection: Post-menopausal  Other Topics Concern  . Not on file  Social History Narrative   Lives alone   Occasional caffeine use.   Right-handed.   Social Determinants of Health   Financial Resource Strain:   . Difficulty of Paying Living Expenses: Not on file  Food Insecurity:   . Worried About Charity fundraiser in the Last Year: Not on file  . Ran Out of Food in the Last Year: Not on file  Transportation Needs:   . Lack of Transportation (Medical): Not on file  . Lack of Transportation (Non-Medical): Not on file  Physical Activity:   . Days of Exercise per Week: Not on file  . Minutes of Exercise per Session: Not on file  Stress:   . Feeling of Stress : Not on file  Social Connections:   . Frequency of Communication with Friends and Family: Not on file  . Frequency of Social Gatherings with Friends and Family: Not on file  . Attends Religious  Services: Not on file  . Active Member of Clubs or Organizations: Not on file  . Attends Archivist Meetings: Not on file  . Marital Status: Not on file  Intimate Partner Violence:   . Fear of Current or Ex-Partner: Not on file  . Emotionally Abused: Not on file  . Physically Abused: Not on file  . Sexually Abused: Not on file     Physical Exam   Vitals:   08/15/20 2311  BP: (!) 226/115  Pulse: 77    CONSTITUTIONAL:  Chronically ill-appearing, NAD NEURO:  Alert, oriented to name, normal and symmetric strength, sensation, slow to respond to questions, seems confused, no slurred speech, difficulty naming objects EYES:  eyes equal and reactive ENT/NECK:  no LAD, no JVD CARDIO: Regular rate, well-perfused, normal S1 and S2 PULM:  CTAB no wheezing or rhonchi GI/GU:  normal bowel sounds, non-distended, non-tender MSK/SPINE:  No gross deformities, no edema SKIN:  no rash, atraumatic PSYCH:  Appropriate speech and behavior  *Additional and/or pertinent findings included in MDM below  Diagnostic and Interventional Summary    EKG Interpretation  Date/Time: 08/15/2020 @ 23:27:21   Ventricular Rate:   86 PR Interval:  157  QRS Duration:  105 QT Interval:   388 QTC Calculation:  465 R Axis:     Text Interpretation:  SR no concerning features Confirmed by Dr. Gerlene Fee at 12:11am      Labs Reviewed  CBG MONITORING, ED - Abnormal; Notable for the following components:      Result Value   Glucose-Capillary 116 (*)    All other components within normal limits  I-STAT CHEM 8, ED - Abnormal; Notable for the following components:   Potassium 3.1 (*)    Glucose, Bld 111 (*)    Calcium, Ion 0.94 (*)    All other components within normal limits  LIPID PANEL  HEMOGLOBIN A1C  URINALYSIS, ROUTINE W REFLEX MICROSCOPIC  PROTIME-INR  APTT  CBC  COMPREHENSIVE METABOLIC PANEL    CT HEAD CODE STROKE WO CONTRAST    (Results Pending)  Chest 2 View    (Results Pending)      Medications   stroke: mapping our early stages of recovery book (has no administration in time range)  acetaminophen (TYLENOL) tablet 650 mg (has no administration in time range)    Or  acetaminophen (TYLENOL) 160 MG/5ML solution 650 mg (has no administration in time range)    Or  acetaminophen (TYLENOL) suppository 650  mg (has no administration in time range)  senna-docusate (Senokot-S) tablet 1 tablet (has no administration in time range)  pantoprazole (PROTONIX) injection 40 mg (has no administration in time range)  clevidipine (CLEVIPREX) infusion 0.5 mg/mL (has no administration in time range)     Procedures  /  Critical Care .Critical Care Performed by: Maudie Flakes, MD Authorized by: Maudie Flakes, MD   Critical care provider statement:    Critical care time (minutes):  35   Critical care was necessary to treat or prevent imminent or life-threatening deterioration of the following conditions:  CNS failure or compromise (Hemorrhagic stroke)   Critical care was time spent personally by me on the following activities:  Discussions with consultants, evaluation of patient's response to treatment, examination of patient, ordering and performing treatments and interventions, ordering and review of laboratory studies, ordering and review of radiographic studies, pulse oximetry, re-evaluation of patient's condition, obtaining history from patient or surrogate and review of old charts    ED Course and Medical Decision Making  I have reviewed the triage vital signs, the nursing notes, and pertinent available records from the EMR.  Listed above are laboratory and imaging tests that I personally ordered, reviewed, and interpreted and then considered in my medical decision making (see below for details).  Concern for stroke versus metabolic disarray versus UTI, work-up pending.     CT head revealing posterior hemorrhagic stroke, starting Cleviprex for blood pressure control,  anticipating admission to neuro ICU.  Barth Kirks. Sedonia Small, [place] [email or web address]  Final Clinical Impressions(s) / ED Diagnoses     ICD-10-CM   1. Hemorrhagic stroke ([name])  I61.9   2. ICH (intracerebral hemorrhage) ([facility])  I61.9 Chest 2 View    Chest 2 View    ED Discharge Orders    None       Discharge Instructions Discussed with and Provided to Patient:   Discharge Instructions   None       Maudie Flakes, MD 08/16/20 [phone]

## 2020-08-15 NOTE — Code Documentation (Addendum)
Stroke Response Nurse Documentation Code Documentation  Tara [name] is a 84 y.o. female arriving to [facility] ED via [facility] on 9/25 with past medical hx of HTN, breast Cancer. Code stroke was activated by EMS. Patient from home where she was LKW at 1900 and now complaining of Headache, expressive aphasia. On No antithrombotic. Stroke team at the bedside on patient arrival. Labs drawn and patient cleared for CT by Dr. Sedonia Small. Patient to CT with team. NIHSS 14, see documentation for details and code stroke times. Patient with disoriented, left gaze preference , right hemianopia, right arm weakness, bilateral leg weakness, right decreased sensation, Expressive aphasia  and dysarthria  on exam. The following imaging was completed:  CT. Patient is not a candidate for tPA due to [place]. Cleviprex started for BP control. Bedside handoff with ED RN [place].    Madelynn Done  Rapid Response RN

## 2020-08-16 ENCOUNTER — Inpatient Hospital Stay (HOSPITAL_COMMUNITY): Payer: Medicare Other

## 2020-08-16 DIAGNOSIS — I611 Nontraumatic intracerebral hemorrhage in hemisphere, cortical: Secondary | ICD-10-CM | POA: Diagnosis not present

## 2020-08-16 LAB — RESPIRATORY PANEL BY RT PCR (FLU A&B, COVID)
Influenza A by PCR: NEGATIVE
Influenza B by PCR: NEGATIVE
SARS Coronavirus 2 by RT PCR: NEGATIVE

## 2020-08-16 LAB — LIPID PANEL
Cholesterol: 141 mg/dL (ref 0–200)
HDL: 73 mg/dL (ref >40)
LDL Cholesterol: 58 mg/dL (ref 0–99)
Total CHOL/HDL Ratio: 1.9 RATIO
Triglycerides: 49 mg/dL (ref <150)
VLDL: 10 mg/dL (ref 0–40)

## 2020-08-16 LAB — TSH: TSH: 1.722 u[IU]/mL (ref 0.350–4.500)

## 2020-08-16 LAB — MRSA PCR SCREENING: MRSA by PCR: NEGATIVE

## 2020-08-16 MED ORDER — LEVOTHYROXINE SODIUM 75 MCG PO TABS
75.0000 ug | ORAL_TABLET | Freq: Every day | ORAL | Status: DC
  Administered 2020-08-20 – 2020-08-27 (×7): 75 ug via ORAL
  Filled 2020-08-16 (×7): qty 1

## 2020-08-16 MED ORDER — CHLORHEXIDINE GLUCONATE CLOTH 2 % EX PADS
6.0000 | MEDICATED_PAD | Freq: Every day | CUTANEOUS | Status: DC
  Administered 2020-08-16 – 2020-08-24 (×8): 6 via TOPICAL

## 2020-08-16 MED ORDER — PROMETHAZINE HCL 25 MG/ML IJ SOLN: 12.5000 mg | Freq: Once | INTRAMUSCULAR | Status: DC

## 2020-08-16 MED ORDER — ORAL CARE MOUTH RINSE
15.0000 mL | Freq: Two times a day (BID) | OROMUCOSAL | Status: DC
  Administered 2020-08-17: 15 mL via OROMUCOSAL

## 2020-08-16 MED ORDER — HALOPERIDOL LACTATE 5 MG/ML IJ SOLN
INTRAMUSCULAR | Status: AC
  Filled 2020-08-16: qty 1

## 2020-08-16 MED ORDER — POTASSIUM CHLORIDE 10 MEQ/100ML IV SOLN
10.0000 meq | INTRAVENOUS | Status: AC
  Administered 2020-08-16 (×4): 10 meq via INTRAVENOUS
  Filled 2020-08-16 (×4): qty 100

## 2020-08-16 MED ORDER — GADOBUTROL 1 MMOL/ML IV SOLN
7.0000 mL | Freq: Once | INTRAVENOUS | Status: AC | PRN
  Administered 2020-08-16: 7 mL via INTRAVENOUS

## 2020-08-16 MED ORDER — DIAZEPAM 5 MG PO TABS
5.0000 mg | ORAL_TABLET | Freq: Two times a day (BID) | ORAL | Status: DC | PRN
  Administered 2020-08-18 – 2020-08-20 (×3): 5 mg via ORAL
  Filled 2020-08-16 (×4): qty 1

## 2020-08-16 MED ORDER — VITAMIN D 25 MCG (1000 UNIT) PO TABS
2000.0000 [IU] | ORAL_TABLET | Freq: Every day | ORAL | Status: DC
  Administered 2020-08-19 – 2020-08-27 (×9): 2000 [IU] via ORAL
  Filled 2020-08-16 (×8): qty 2

## 2020-08-16 MED ORDER — HALOPERIDOL LACTATE 5 MG/ML IJ SOLN
1.0000 mg | Freq: Once | INTRAMUSCULAR | Status: AC
  Administered 2020-08-16: 1 mg via INTRAVENOUS

## 2020-08-16 MED ORDER — LOSARTAN POTASSIUM 50 MG PO TABS
25.0000 mg | ORAL_TABLET | Freq: Every day | ORAL | Status: DC
  Administered 2020-08-19 – 2020-08-20 (×2): 25 mg via ORAL
  Filled 2020-08-16 (×2): qty 1

## 2020-08-16 MED ORDER — AMLODIPINE BESYLATE 10 MG PO TABS
10.0000 mg | ORAL_TABLET | Freq: Every day | ORAL | Status: DC
  Administered 2020-08-19 – 2020-08-27 (×9): 10 mg via ORAL
  Filled 2020-08-16: qty 2
  Filled 2020-08-16 (×2): qty 1
  Filled 2020-08-16 (×2): qty 2
  Filled 2020-08-16 (×4): qty 1

## 2020-08-16 NOTE — Evaluation (Signed)
Clinical/Bedside Swallow Evaluation Patient Details  Name: Tara [name] MRN: [id number] Date of Birth: May 02, 1936  Today's Date: 08/16/2020 Time: SLP Start Time (ACUTE ONLY): 0945 SLP Stop Time (ACUTE ONLY): 1005 SLP Time Calculation (min) (ACUTE ONLY): 20 min  Past Medical History:  Past Medical History:  Diagnosis Date  . Arthritis    knees  . Breast cancer ([place]) 09/30/15   right breast  . Dependent edema   . Depression    just lost spouse in Nov. 2018  . Essential hypertension, benign 04/15/2011  . Herniated lumbar disc without myelopathy   . Hx of staphylococcal infection   . Hypertension   . Hypothyroidism   . Obesity   . Pre-diabetes   . Radicular pain   . Recurrent UTI   . Stroke [facility])    ' mini"  . Thyroid disease    hypothyroidism  . Vertigo   . Wears contact lenses    one   Past Surgical History:  Past Surgical History:  Procedure Laterality Date  . ANKLE SURGERY  1994   RIGHT  . BACK SURGERY    . BREAST LUMPECTOMY WITH NEEDLE LOCALIZATION Right 09/30/2015   Procedure: RIGHT BREAST LUMPECTOMY WITH NEEDLE LOCALIZATION;  Surgeon: Fanny Skates, MD;  Location: [place];  Service: General;  Laterality: Right;  . BREAST SURGERY    . COLONOSCOPY W/ BIOPSIES AND POLYPECTOMY    . EYE SURGERY     bilateral  . HAND SURGERY  1951   RIGHT HAND  . JOINT REPLACEMENT     rt shoulder rotator cuff  . KNEE ARTHROSCOPY     left knee  . LUMBAR LAMINECTOMY/DECOMPRESSION MICRODISCECTOMY Right 01/11/2013   Procedure: LUMBAR LAMINECTOMY/DECOMPRESSION MICRODISCECTOMY 1 LEVEL,Right Lumbar Three-Four;  Surgeon: Elaina Hoops, MD;  Location: [place] [facility];  Service: Neurosurgery;  Laterality: Right;  . ROTATOR CUFF REPAIR  2011   RIGHT  . TONSILLECTOMY    . TOTAL KNEE ARTHROPLASTY     Left  . TOTAL KNEE ARTHROPLASTY Right 12/25/2017   Procedure: TOTAL KNEE ARTHROPLASTY;  Surgeon: Vickey Huger, MD;  Location: [place];  Service: Orthopedics;  Laterality: Right;    HPI:  Patient is an 84 y.o. female resident at independent living facility with PMH: right breast CA, HTN, hypothyroidism, pre-diabetes, recurrent UTI, prior CVA, depression, arthritis, who presented to ED via EMS as code stroke. In ED, she exhibited receptive and expressive aphasia but able to communicate enough to state that she had a severe headache. CT revealed left parieto-occipital ICH.   Assessment / Plan / Recommendation Clinical Impression  Patient presents with a mild-moderate oropharyngeal dysphagia with impact from current state of poor alertness. When PO's presented to her lips, she did exhibit adequate oral preparatory phase of swallow with spoon bites of gelatin, cup sips and spoon sips of thin liquids and ice chips. She consume consecutive cup sips of thin water with mild instance and intensity of delayed dry sounding cough. Voice remained clear throughout and no other overt s/s of difficulty were observed. Patient did masticate ice chips and initiated swallow with all PO's and with full clearance of oral cavity post swallows. Patient is not alert enough for full PO's and in addition, SLP is considering modified barium swallow study when patient more alert. SLP Visit Diagnosis: Dysphagia, unspecified (R13.10)    Aspiration Risk  Mild aspiration risk    Diet Recommendation NPO except meds;Ice chips PRN after oral care   Liquid Administration via: Cup;Spoon Medication Administration: Crushed  with puree Supervision: Staff to assist with self feeding;Full supervision/cueing for compensatory strategies Compensations: Slow rate;Small sips/bites;Minimize environmental distractions Postural Changes: Seated upright at 90 degrees    Other  Recommendations Oral Care Recommendations: Oral care BID;Staff/trained caregiver to provide oral care   Follow up Recommendations Inpatient Rehab;24 hour supervision/assistance      Frequency and Duration min 2x/week  2 weeks       Prognosis  Prognosis for Safe Diet Advancement: Good      Swallow Study   General Date of Onset: 08/15/20 HPI: Patient is an 84 y.o. female resident at independent living facility with PMH: right breast CA, HTN, hypothyroidism, pre-diabetes, recurrent UTI, prior CVA, depression, arthritis, who presented to ED via EMS as code stroke. In ED, she exhibited receptive and expressive aphasia but able to communicate enough to state that she had a severe headache. CT revealed left parieto-occipital ICH. Type of Study: Bedside Swallow Evaluation Previous Swallow Assessment: None Diet Prior to this Study: NPO Temperature Spikes Noted: No Respiratory Status: Room air History of Recent Intubation: No Behavior/Cognition: Alert;Confused;Requires cueing;Distractible;Doesn't follow directions Oral Cavity Assessment: Other (comment) (limited due to patient non-compliance, mild dry skin on lips, oral mucosa that could be seen appeared [facility]) Oral Care Completed by SLP: Yes Oral Cavity - Dentition: Adequate natural dentition Self-Feeding Abilities: Total assist Patient Positioning: Upright in bed Baseline Vocal Quality: Normal Volitional Cough: Cognitively unable to elicit Volitional Swallow: Unable to elicit    Oral/Motor/Sensory Function Overall Oral Motor/Sensory Function: Mild impairment (full assessment not completed due to patient non-compliance) Facial ROM: Reduced right Facial Symmetry: Abnormal symmetry right Facial Strength: Reduced right Mandible: Within Functional Limits   Ice Chips Ice chips: Impaired Oral Phase Impairments: Poor awareness of bolus Pharyngeal Phase Impairments: Suspected delayed Swallow   Thin Liquid Thin Liquid: Impaired Presentation: Cup;Spoon Oral Phase Functional Implications: Right anterior spillage Pharyngeal  Phase Impairments: Cough - Delayed;Suspected delayed [place], MA, [facility]

## 2020-08-16 NOTE — Evaluation (Signed)
Physical Therapy Evaluation Patient Details Name: Tara [name] MRN: [id number] DOB: Apr 19, 1936 Today's Date: 08/16/2020   History of Present Illness  Tara [name] is an 84 y.o. female with a PMHx of right breast CA, HTN, hypothyroidism, pre-diabetes, recurrent UTI and prior stroke who  Demonstrating receptive and expressive aphasia, headache. head CT revealed a left parieto-occipital acute ICH.  Clinical Impression  Pt presents to PT with deficits in functional mobility, balance, cognition, communication, tone, visual-spatial awareness. Pt demonstrates significant aphasia and does not follow PT commands, demonstrating very limited understanding of PT conversation. Pt pushing hard to right side with LUE and demonstrates extensor tone throughout body with attempts at mobilizing to the edge of bed. Pt will require 2 person assist to attempt bed mobility safely at next session due to her current functional deficits. PT recommends SNF placement at the time of discharge, however if the patient is able to demonstrate improved command following over the upcoming sessions then she may progress to CIR.    Follow Up Recommendations SNF (SNF until pt can improve command following)    Equipment Recommendations  Wheelchair (measurements PT);Wheelchair cushion (measurements PT);Hospital bed (mechanical lift)    Recommendations for Other Services       Precautions / Restrictions Precautions Precautions: Fall Restrictions Weight Bearing Restrictions: No      Mobility  Bed Mobility Overal bed mobility: Needs Assistance Bed Mobility: Rolling;Supine to Sit Rolling: Mod assist   Supine to sit: Total assist     General bed mobility comments: PT attempts supine to sit but pt is actively resisting along with significant extensor tone. PT unable to assist pt into sitting safely  Transfers                    Ambulation/Gait                Stairs            Wheelchair  Mobility    Modified Rankin (Stroke Patients Only) Modified Rankin (Stroke Patients Only) Pre-Morbid Rankin Score: Slight disability Modified Rankin: Severe disability     Balance Overall balance assessment:  (unable to achieve sitting 2/2 extensor tone)                                           Pertinent Vitals/Pain Pain Assessment: Faces Faces Pain Scale: Hurts little more Pain Location: generalized Pain Descriptors / Indicators: Restless Pain Intervention(s): Monitored during session    Home Living Family/patient expects to be discharged to:: Other (Comment) (independent living)     Type of Home: Independent living facility         Home Equipment: Walker - 2 wheels Additional Comments: daughter provides history as pt is lethargic    Prior Function Level of Independence: Independent with assistive device(s)         Comments: ambulates with use of RW     Hand Dominance   Dominant Hand: Right    Extremity/Trunk Assessment   Upper Extremity Assessment Upper Extremity Assessment: LUE deficits/detail LUE Deficits / Details: shoulder and elbow joint strength grossly 4+/5, significant extensor tone    Lower Extremity Assessment Lower Extremity Assessment: LLE deficits/detail;RLE deficits/detail RLE Deficits / Details: at least 3/5 strength based on observed mobility, difficult to assess formally 2/2 impaired cognition and communication LLE Deficits / Details: at least 3/5 strength based on observed mobility, difficult  to assess formally 2/2 impaired cognition and communication. Significant extensor tone vs patient resistance with attempted PROM       Communication   Communication: Expressive difficulties;Receptive difficulties  Cognition Arousal/Alertness: Awake/alert Behavior During Therapy: Restless Overall Cognitive Status: Difficult to assess                                 General Comments: pt does not follow commands  and majority of speech is garbled and incomprehensible. Pt demonstrates no awareness of deficits or situation, squirming in bed endlessly      General Comments General comments (skin integrity, edema, etc.): VSS on RA, pt with left gaze preference, likely R inattention/neglect, pt appears to be pushing toward R side with LUE    Exercises     Assessment/Plan    PT Assessment Patient needs continued PT services  PT Problem List Decreased strength;Decreased balance;Decreased mobility;Decreased cognition;Impaired tone;Decreased safety awareness;Decreased knowledge of use of DME;Decreased coordination       PT Treatment Interventions DME instruction;Gait training;Functional mobility training;Therapeutic activities;Therapeutic exercise;Balance training;Neuromuscular re-education;Cognitive remediation;Patient/family education;Wheelchair mobility training    PT Goals (Current goals can be found in the Care Plan section)  Acute Rehab PT Goals Patient Stated Goal: pt unable to state, family goal to return to independent living PT Goal Formulation: With family Time For Goal Achievement: 08/30/20 Potential to Achieve Goals: Poor    Frequency Min 3X/week   Barriers to discharge        Co-evaluation               AM-PAC PT "6 Clicks" Mobility  Outcome Measure Help needed turning from your back to your side while in a flat bed without using bedrails?: A Lot Help needed moving from lying on your back to sitting on the side of a flat bed without using bedrails?: Total Help needed moving to and from a bed to a chair (including a wheelchair)?: Total Help needed standing up from a chair using your arms (e.g., wheelchair or bedside chair)?: Total Help needed to walk in hospital room?: Total Help needed climbing 3-5 steps with a railing? : Total 6 Click Score: 7    End of Session   Activity Tolerance: Other (comment) (limited 2/2 cognition and communication deficits) Patient left: in  bed;with call bell/phone within reach;with bed alarm set;with restraints reapplied Nurse Communication: Mobility status;Need for lift equipment PT Visit Diagnosis: Other abnormalities of gait and mobility (R26.89);Other symptoms and signs involving the nervous system (R29.898)    Time: 3428-7681 PT Time Calculation (min) (ACUTE ONLY): 18 min   Charges:   PT Evaluation $PT Eval Moderate Complexity: 1 Mod          Zenaida Niece, PT, DPT Acute Rehabilitation Pager: [phone]   Zenaida Niece 08/16/2020, 3:24 PM

## 2020-08-16 NOTE — Evaluation (Signed)
Speech Language Pathology Evaluation Patient Details Name: Tara [name] MRN: [id number] DOB: [birth year] Today's Date: 08/16/2020 Time: 7672-0947 SLP Time Calculation (min) (ACUTE ONLY): 25 min  Problem List:  Patient Active Problem List   Diagnosis Date Noted  . ICH (intracerebral hemorrhage) ([place]) 08/15/2020  . Anxiety state   . Acute blood loss anemia   . Lumbar radiculopathy 05/28/2019  . Postoperative pain   . Drug induced constipation   . History of TIA (transient ischemic attack)   . Lumbar spinal stenosis 05/23/2019  . HNP (herniated nucleus pulposus), lumbar 05/23/2019  . TIA (transient ischemic attack) 07/02/2018  . Chronic nonintractable headache 07/02/2018  . Onychomycosis due to dermatophyte 05/10/2018  . Other acquired deformity of toe 05/10/2018  . S/P total knee replacement 12/25/2017  . Chronic pain of right knee 10/19/2017  . Ductal carcinoma in situ of right breast 08/17/2015  . Breast cancer of upper-outer quadrant of right female breast ([place]) 08/14/2015  . Right hip pain 10/27/2012  . Low back pain 10/27/2012  . Hypertension   . Osteoarthritis of right knee 11/30/2011  . Baker's cyst of knee 11/30/2011  . Dependent edema 06/19/2011  . Prediabetes 06/19/2011  . Urinary tract infection, recurrent 06/19/2011  . Urinary incontinence 06/19/2011  . Benign positional vertigo 06/19/2011  . Hypothyroidism 06/19/2011  . Tubular adenoma of colon 06/19/2011  . Essential hypertension, benign 04/15/2011   Past Medical History:  Past Medical History:  Diagnosis Date  . Arthritis    knees  . Breast cancer ([place]) 09/30/15   right breast  . Dependent edema   . Depression    just lost spouse in Nov. 2018  . Essential hypertension, benign 04/15/2011  . Herniated lumbar disc without myelopathy   . Hx of staphylococcal infection   . Hypertension   . Hypothyroidism   . Obesity   . Pre-diabetes   . Radicular pain   . Recurrent UTI   . Stroke [facility])    ' mini"   . Thyroid disease    hypothyroidism  . Vertigo   . Wears contact lenses    one   Past Surgical History:  Past Surgical History:  Procedure Laterality Date  . ANKLE SURGERY  1994   RIGHT  . BACK SURGERY    . BREAST LUMPECTOMY WITH NEEDLE LOCALIZATION Right 09/30/2015   Procedure: RIGHT BREAST LUMPECTOMY WITH NEEDLE LOCALIZATION;  Surgeon: Fanny Skates, MD;  Location: [facility];  Service: General;  Laterality: Right;  . BREAST SURGERY    . COLONOSCOPY W/ BIOPSIES AND POLYPECTOMY    . EYE SURGERY     bilateral  . HAND SURGERY  1951   RIGHT HAND  . JOINT REPLACEMENT     rt shoulder rotator cuff  . KNEE ARTHROSCOPY     left knee  . LUMBAR LAMINECTOMY/DECOMPRESSION MICRODISCECTOMY Right 01/11/2013   Procedure: LUMBAR LAMINECTOMY/DECOMPRESSION MICRODISCECTOMY 1 LEVEL,Right Lumbar Three-Four;  Surgeon: Elaina Hoops, MD;  Location: [facility];  Service: Neurosurgery;  Laterality: Right;  . ROTATOR CUFF REPAIR  2011   RIGHT  . TONSILLECTOMY    . TOTAL KNEE ARTHROPLASTY     Left  . TOTAL KNEE ARTHROPLASTY Right 12/25/2017   Procedure: TOTAL KNEE ARTHROPLASTY;  Surgeon: Vickey Huger, MD;  Location: [facility];  Service: Orthopedics;  Laterality: Right;   HPI:  Patient is an 84 y.o. female resident at independent living facility with PMH: right breast CA, HTN, hypothyroidism, pre-diabetes, recurrent UTI, prior CVA, depression, arthritis, who presented to  ED via EMS as code stroke. In ED, she exhibited receptive and expressive aphasia but able to communicate enough to state that she had a severe headache. CT revealed left parieto-occipital ICH.   Assessment / Plan / Recommendation Clinical Impression  Patient presents with a mod-severe global aphasia with possible impact from sedating medications (though RN reports Ativan was given at 2300 on 9/25). She was unable to follow any commands despite verbal, visual, tactile cues. Patient was lerthargic but alertness did improve as  session progressed. When she spoke, she presented with severe expressive language impairment with perseveration and only two instances of meaningful utterances ("that hurts" and "think thats enough"). Patient appeared with complete neglect of right visual field and even when daughter and/or SLP standing on her left side, patient rarely made adequate eye contact. Currently, patient is unable to express even basic wants/nees and is not following any commands.    SLP Assessment  SLP Recommendation/Assessment: Patient needs continued Speech Lanaguage Pathology Services SLP Visit Diagnosis: Aphasia (R47.01)    Follow Up Recommendations  Inpatient Rehab;24 hour supervision/assistance    Frequency and Duration min 2x/week  2 weeks      SLP Evaluation Cognition  Overall Cognitive Status: Impaired/Different from baseline Arousal/Alertness: Lethargic Orientation Level: Disoriented X4 Attention: Focused Focused Attention: Impaired Focused Attention Impairment: Verbal basic;Functional basic Memory: Impaired (unable to assess due to severe language impairment) Behaviors: Perseveration Comments: Patient is lethargic but able to be alerted; does not follow any commands, cognition unable to be fully assessed but suspect impairments in most/all areas       Comprehension  Auditory Comprehension Overall Auditory Comprehension: Impaired Yes/No Questions: Not tested Commands: Impaired One Step Basic Commands: 0-24% accurate Interfering Components: Attention Visual Recognition/Discrimination Discrimination: Not tested Reading Comprehension Reading Status: Not tested    Expression Expression Primary Mode of Expression: Verbal Verbal Expression Overall Verbal Expression: Impaired Initiation: Impaired Automatic Speech:  (unable to perform) Level of Generative/Spontaneous Verbalization:  (severe expressive aphasia; able to produce two meaningful utterances, "think thats enough" (when asked if she  wanted more water) and "that hurts" (MD testing her movement, sensation in feet).) Naming: Not tested Pragmatics: Impairment Impairments: Eye contact Interfering Components: Attention Non-Verbal Means of Communication: Not applicable   Oral / Motor  Oral Motor/Sensory Function Overall Oral Motor/Sensory Function: Mild impairment (limited due to patient unable to follow commands) Facial ROM: Reduced right Facial Symmetry: Abnormal symmetry right Mandible: Within Functional Limits Motor Speech Overall Motor Speech: Impaired (difficult to assess secondary to severe expressive aphasia) Respiration: Within functional limits Phonation: Normal Resonance: Within functional limits Articulation: Impaired Intelligibility: Intelligibility reduced Word: 50-74% accurate Phrase: 25-49% accurate Motor Planning: Witnin functional limits   [place], MA, CCC-SLP Speech Therapy [facility] Acute Rehab

## 2020-08-16 NOTE — Progress Notes (Signed)
OT Cancellation Note  Patient Details Name: CREASIE LACOSSE MRN: [id number] DOB: [birth year]   Cancelled Treatment:    Reason Eval/Treat Not Completed: Patient at procedure or test/ unavailable (going to MRI) OT will continue to follow for evaluation as schedule allows  Jaci Carrel 08/16/2020, 11:02 AM  [place] Pager: [phone] Office: [phone]

## 2020-08-16 NOTE — Progress Notes (Signed)
STROKE TEAM PROGRESS NOTE   HISTORY OF PRESENT ILLNESS (per record) Tara [name] is an 84 y.o. female with a PMHx of right breast CA, HTN, hypothyroidism, pre-diabetes, recurrent UTI and prior stroke who presents via EMS to the ED from her assisted living facility as a Code Stroke. LKN was 1900 when family spoke to her over the telephone. When she was checked on by someone at her facility later in the evening, she was noted to be aphasic and EMS was called. BP en route was 226/115. On arrival to the ED she exhibited receptive and expressive aphasia but could communicate enough to state that she had a severe headache. She also was clutching her head in pain, which became more frequent during the assessment.  STAT head CT revealed a left parieto-occipital acute ICH.  Home medications include ASA, atorvastatin and levothyroxine. She is not on a blood thinner.   INTERVAL HISTORY Her daughter is at the bedside.  She was agitated last night and required sedation with Ativan.  She is sleepy this morning and is in bilateral hand restraints.  Blood pressure controlled on Cleviprex drip.  MRI is pending   OBJECTIVE Vitals:   08/16/20 0200 08/16/20 0300 08/16/20 0400 08/16/20 0405  BP: 129/69 (!) 133/99 (!) 141/52 (!) 139/55  Pulse: 84 (!) 107 88 87  Resp: 20 (!) 27 17 19   Temp:   98.9 F (37.2 C)   TempSrc:   Oral   SpO2: 98% 96% 97% 98%  Weight:        CBC:  Recent Labs  Lab 08/15/20 2304 08/15/20 2310  WBC 7.8  --   HGB 14.9 15.0  HCT 45.0 44.0  MCV 98.7  --   PLT 218  --     Basic Metabolic Panel:  Recent Labs  Lab 08/15/20 2304 08/15/20 2310  NA 142 140  K 3.1* 3.1*  CL 106 107  CO2 24  --   GLUCOSE 115* 111*  BUN 10 11  CREATININE 0.73 0.60  CALCIUM 9.3  --     Lipid Panel:     Component Value Date/Time   CHOL 141 08/15/2020 2304   TRIG 49 08/15/2020 2304   HDL 73 08/15/2020 2304   CHOLHDL 1.9 08/15/2020 2304   VLDL 10 08/15/2020 2304   LDLCALC 58 08/15/2020  2304   LDLCALC 126 (H) 10/19/2017 1223   HgbA1c:  Lab Results  Component Value Date   HGBA1C 5.5 08/15/2020   Urine Drug Screen: No results found for: LABOPIA, COCAINSCRNUR, LABBENZ, AMPHETMU, THCU, LABBARB  Alcohol Level No results found for: [facility]  IMAGING  DG Chest Port 1 View 08/16/2020 IMPRESSION:  No active disease.   CT HEAD CODE STROKE WO CONTRAST 08/15/2020 IMPRESSION:  1. Acute intraparenchymal hemorrhage centered at the right parieto-occipital region, estimated volume 13 cc. Minimal surrounding edema without significant regional mass effect.  2. Underlying age-related cerebral atrophy with mild chronic small vessel ischemic disease.   CT HEAD WO CONTRAST - pending  MRI BRAIN WO CONTRAST - pending   ECG - SR rate 86 BPM. (See cardiology reading for complete details)   PHYSICAL EXAM Blood pressure (!) 139/55, pulse 87, temperature 98.9 F (37.2 C), temperature source Oral, resp. rate 19, weight 73.5 kg, SpO2 98 %. Frail elderly Caucasian lady not in distress. . Afebrile. Head is nontraumatic. Neck is supple without bruit.    Cardiac exam no murmur or gallop. Lungs are clear to auscultation. Distal pulses are well felt. Neurological Exam :  Patient is drowsy but can be aroused and opens eyes.  She mumbles mostly nonsensical staff.  She is disoriented to time place and person.  She follows barely few simple midline and one-step commands.  Pupils equal reactive.  Does not blink to threat on right side but does so on the left.  .  Doll's eye movements are sluggish.  Face is symmetric.  Tongue midline.  Motor system exam able to move all 4 extremities of the bed purposefully but not cooperative for detailed exam.  Sensation appears preserved.  Plantars downgoing.  Gait not tested     ASSESSMENT/PLAN Tara [name] is a 84 y.o. female with history of right breast CA, HTN, hypothyroidism, pre-diabetes, recurrent UTI and prior stroke who presents by EMS with aphasia, BP  en route 226/115., and a severe headache. A STAT head CT revealed a left parieto-occipital acute ICH. She did not receive IV t-PA due to [place].  Stroke: left parieto-occipital acute ICH  Resultant altered mental status and right hemianopsia  Code Stroke CT Head - Acute intraparenchymal hemorrhage centered at the right parieto-occipital region, estimated volume 13 cc. Minimal surrounding edema without significant regional mass effect.   CT head - pending  MRI head - pending  MRA head - not ordered  CTA H&N - not ordered  CT Perfusion - not ordered  Carotid Doppler - not ordered  2D Echo - not ordered  [employer] Virus 2 - negative  LDL - 58  HgbA1c - 5.5  UDS - not ordered  VTE prophylaxis - SCDs Diet  Diet Order            Diet NPO time specified  Diet effective now                  No antithrombotic prior to admission, now on No antithrombotic  Ongoing aggressive stroke risk factor management  Therapy recommendations:  pending  Disposition:  Pending  Hypertension  Home BP meds: amlodipine ; Losartan   Current BP meds: amlodipine ; Losartan ; prn Cleviprex  Stable . SBP goal < 140 mm Hg initially . Long-term BP goal normotensive  Hyperlipidemia  Home Lipid lowering medication: Lipitor 40 mg daily  LDL 58, goal < 70  Current lipid lowering medication: None (statin contraindicated with ICH)  Continue statin at discharge  Pre Diabetes  Home diabetic meds: none  Current diabetic meds: none  HgbA1c 5.5, goal < 7.0  Other Stroke Risk Factors  Advanced age  Overweight, Body mass index is 28.7 kg/m., recommend weight loss, diet and exercise as appropriate   Family hx stroke (sister)   Hx stroke/TIA  Other Active Problems  Code status - DNR  NPO  Hypokalemia - 3.1 - supplement - recheck in AM  Family contact: Doyle Askew (daughter) at [phone]  UA - pending   Hospital day # 1 She presented with headache and altered  mental status due to left parieto-occipital parenchymal hemorrhage likely hypertensive etiology versus amyloid angiopathy.  Recommend close neurological monitoring and strict blood pressure control with systolic less than 098 for 24 hours and then below 160.  Check MRI scan of the brain later.  Hold antiplatelet agents.  Long discussion with the patient and daughter at the bedside and answered questions. This patient is critically ill and at significant risk of neurological worsening, death and care requires constant monitoring of vital signs, hemodynamics,respiratory and cardiac monitoring, extensive review of multiple databases, frequent neurological assessment, discussion with family, other specialists  and medical decision making of high complexity.I have made any additions or clarifications directly to the above note.This critical care time does not reflect procedure time, or teaching time or supervisory time of PA/NP/Med Resident etc but could involve care discussion time.  I spent 30 minutes of neurocritical care time  in the care of  this patient.   Antony Contras, MD  To contact Stroke Continuity provider, please refer to [email or web address] After hours, contact General Neurology

## 2020-08-17 ENCOUNTER — Inpatient Hospital Stay (HOSPITAL_COMMUNITY): Payer: Medicare Other

## 2020-08-17 DIAGNOSIS — I610 Nontraumatic intracerebral hemorrhage in hemisphere, subcortical: Secondary | ICD-10-CM | POA: Diagnosis not present

## 2020-08-17 DIAGNOSIS — I619 Nontraumatic intracerebral hemorrhage, unspecified: Secondary | ICD-10-CM | POA: Diagnosis not present

## 2020-08-17 LAB — CBC
HCT: 40.3 % (ref 36.0–46.0)
Hemoglobin: 13.2 g/dL (ref 12.0–15.0)
MCH: 32.4 pg (ref 26.0–34.0)
MCHC: 32.8 g/dL (ref 30.0–36.0)
MCV: 98.8 fL (ref 80.0–100.0)
Platelets: 173 10*3/uL (ref 150–400)
RBC: 4.08 MIL/uL (ref 3.87–5.11)
RDW: 13 % (ref 11.5–15.5)
WBC: 8.3 10*3/uL (ref 4.0–10.5)
nRBC: 0 % (ref 0.0–0.2)

## 2020-08-17 LAB — BASIC METABOLIC PANEL
Anion gap: 9 (ref 5–15)
BUN: 12 mg/dL (ref 8–23)
CO2: 22 mmol/L (ref 22–32)
Calcium: 8.8 mg/dL — ABNORMAL LOW (ref 8.9–10.3)
Chloride: 115 mmol/L — ABNORMAL HIGH (ref 98–111)
Creatinine, Ser: 0.66 mg/dL (ref 0.44–1.00)
GFR calc Af Amer: >60 mL/min (ref >60)
GFR calc non Af Amer: >60 mL/min (ref >60)
Glucose, Bld: 117 mg/dL — ABNORMAL HIGH (ref 70–99)
Potassium: 3.6 mmol/L (ref 3.5–5.1)
Sodium: 146 mmol/L — ABNORMAL HIGH (ref 135–145)

## 2020-08-17 LAB — URINALYSIS, ROUTINE W REFLEX MICROSCOPIC
Bacteria, UA: NONE SEEN
Bilirubin Urine: NEGATIVE
Glucose, UA: NEGATIVE mg/dL
Ketones, ur: 5 mg/dL — AB
Leukocytes,Ua: NEGATIVE
Nitrite: NEGATIVE
Protein, ur: NEGATIVE mg/dL
Specific Gravity, Urine: 1.013 (ref 1.005–1.030)
pH: 6 (ref 5.0–8.0)

## 2020-08-17 LAB — MAGNESIUM: Magnesium: 2 mg/dL (ref 1.7–2.4)

## 2020-08-17 LAB — SODIUM: Sodium: 148 mmol/L — ABNORMAL HIGH (ref 135–145)

## 2020-08-17 MED ORDER — SODIUM CHLORIDE 3 % IV SOLN
INTRAVENOUS | Status: DC
  Filled 2020-08-17 (×2): qty 500

## 2020-08-17 MED ORDER — ORAL CARE MOUTH RINSE
15.0000 mL | Freq: Two times a day (BID) | OROMUCOSAL | Status: DC
  Administered 2020-08-17 – 2020-08-27 (×20): 15 mL via OROMUCOSAL

## 2020-08-17 MED ORDER — POTASSIUM CHLORIDE 10 MEQ/100ML IV SOLN
10.0000 meq | INTRAVENOUS | Status: AC
  Administered 2020-08-17 (×4): 10 meq via INTRAVENOUS
  Filled 2020-08-17 (×4): qty 100

## 2020-08-17 MED ORDER — SODIUM CHLORIDE 3 % IV SOLN
50.0000 mL/h | INTRAVENOUS | Status: DC
  Administered 2020-08-17: 50 mL/h via INTRAVENOUS
  Filled 2020-08-17 (×2): qty 500

## 2020-08-17 MED ORDER — LABETALOL HCL 5 MG/ML IV SOLN
10.0000 mg | INTRAVENOUS | Status: DC | PRN
  Administered 2020-08-17 (×2): 40 mg via INTRAVENOUS
  Administered 2020-08-17 – 2020-08-18 (×5): 20 mg via INTRAVENOUS
  Administered 2020-08-19 (×2): 40 mg via INTRAVENOUS
  Administered 2020-08-19 – 2020-08-20 (×2): 20 mg via INTRAVENOUS
  Administered 2020-08-20: 40 mg via INTRAVENOUS
  Administered 2020-08-20: 20 mg via INTRAVENOUS
  Administered 2020-08-20: 30 mg via INTRAVENOUS
  Administered 2020-08-21 – 2020-08-22 (×4): 20 mg via INTRAVENOUS
  Filled 2020-08-17 (×2): qty 8
  Filled 2020-08-17: qty 4
  Filled 2020-08-17 (×2): qty 8
  Filled 2020-08-17 (×3): qty 4
  Filled 2020-08-17: qty 8
  Filled 2020-08-17: qty 4
  Filled 2020-08-17 (×2): qty 8
  Filled 2020-08-17: qty 4
  Filled 2020-08-17: qty 8
  Filled 2020-08-17: qty 4

## 2020-08-17 MED ORDER — CHLORHEXIDINE GLUCONATE 0.12 % MT SOLN
15.0000 mL | Freq: Two times a day (BID) | OROMUCOSAL | Status: DC
  Administered 2020-08-17 – 2020-08-27 (×19): 15 mL via OROMUCOSAL
  Filled 2020-08-17 (×14): qty 15

## 2020-08-17 MED ORDER — HYDRALAZINE HCL 20 MG/ML IJ SOLN
10.0000 mg | Freq: Four times a day (QID) | INTRAMUSCULAR | Status: DC | PRN
  Administered 2020-08-17 – 2020-08-22 (×8): 10 mg via INTRAVENOUS
  Filled 2020-08-17 (×7): qty 1

## 2020-08-17 MED ORDER — SODIUM CHLORIDE 3 % IV SOLN
INTRAVENOUS | Status: DC
  Filled 2020-08-17 (×4): qty 500

## 2020-08-17 NOTE — Progress Notes (Signed)
S/O: Repeat CT head reveals interval increase in size of acute intraparenchymal hemorrhage centered at the left parieto-occipital region, now measuring 3.3 x 4.0 x 4.3 cm (estimated volume 28 CC). Surrounding vasogenic edema has increased, with increased mass effect on the adjacent left lateral ventricle. No significant left-to-right midline shift.  BP (!) 136/57 (BP Location: Left Arm)   Pulse 85   Temp 98.3 F (36.8 C) (Axillary)   Resp 16   Wt 73.5 kg   SpO2 97%   BMI 28.70 kg/m   Exam is stable.   Medications: .  stroke: mapping our early stages of recovery book   Does not apply Once  . amLODipine  10 mg Oral Daily  . Chlorhexidine Gluconate Cloth  6 each Topical Daily  . cholecalciferol  2,000 Units Oral Daily  . levothyroxine  75 mcg Oral Daily  . losartan  25 mg Oral Daily  . mouth rinse  15 mL Mouth Rinse BID  . pantoprazole (PROTONIX) IV  40 mg Intravenous QHS  . promethazine  12.5 mg Intravenous Once  . senna-docusate  1 tablet Oral BID   . clevidipine 7 mg/hr (08/17/20 0000)     A/R: 84 year old female with left parieto-occipital ICH. Now with enlargment, increased edema and increased mass effect.  1. Starting hypertonic saline at 50 cc/hr 2. Repeat CT head in 12 hours 3. Frequent neuro checks (W9N)  Electronically signed: Dr. Kerney Elbe

## 2020-08-17 NOTE — Plan of Care (Signed)
  Problem: Nutrition: Goal: Risk of aspiration will decrease Outcome: Not Progressing   Problem: Intracerebral Hemorrhage Tissue Perfusion: Goal: Complications of Intracerebral Hemorrhage will be minimized Outcome: Not Progressing   Problem: Clinical Measurements: Goal: Ability to maintain clinical measurements within normal limits will improve Outcome: Not Progressing Goal: Diagnostic test results will improve Outcome: Not Progressing Goal: Respiratory complications will improve Outcome: Progressing Goal: Cardiovascular complication will be avoided Outcome: Progressing   Problem: Pain Managment: Goal: General experience of comfort will improve Outcome: Not Progressing

## 2020-08-17 NOTE — Progress Notes (Signed)
EEG completed, results pending. 

## 2020-08-17 NOTE — Progress Notes (Signed)
  Speech Language Pathology Treatment: Dysphagia  Patient Details Name: Tara [name] MRN: [id number] DOB: [birth year] Today's Date: 08/17/2020 Time: 9485-4627 SLP Time Calculation (min) (ACUTE ONLY): 16 min  Assessment / Plan / Recommendation Clinical Impression  Pt is more lethargic today, with CT showing increased hemorrhage. She can arouse to tactile > auditory stimulation, but does not focus her attention well. As session went on, pt started opening her mouth more in response to tactile input, but she made no attempts to take POs. She was not alert enough for SLP to assist with a more passive acceptance. Recommend that she remain NPO pending improved LOA. Will continue to follow.   HPI HPI: Patient is an 84 y.o. female resident at independent living facility with PMH: right breast CA, HTN, hypothyroidism, pre-diabetes, recurrent UTI, prior CVA, depression, arthritis, who presented to ED via EMS as code stroke. In ED, she exhibited receptive and expressive aphasia but able to communicate enough to state that she had a severe headache. CT revealed left parieto-occipital ICH.      SLP Plan  Continue with current plan of care       Recommendations  Diet recommendations: NPO Medication Administration: Via alternative means                Oral Care Recommendations: Oral care QID Follow up Recommendations: Inpatient Rehab;24 hour supervision/assistance SLP Visit Diagnosis: Dysphagia, unspecified (R13.10) Plan: Continue with current plan of care       GO                [name], M.A. [place] Acute Rehabilitation Services Pager [phone] Office [phone]  08/17/2020, 11:24 AM

## 2020-08-17 NOTE — Progress Notes (Signed)
STROKE TEAM PROGRESS NOTE   INTERVAL HISTORY Her daughter is at the bedside. Put on 3% during the night for worsening ICH. Neuro stable this am, sleepy. Remains on Cleviprex < 140. Will increase goal w/ prns to wean. Per daughter, pt DNR PTA and would not want long-term machines. Patient also has a son who is not at bedside d/t COVID rules.  Blood pressure adequately controlled.  Neuro exam unchanged  OBJECTIVE Vitals:   08/17/20 0600 08/17/20 0645 08/17/20 0700 08/17/20 0800  BP: 101/63 (!) 159/91 132/61   Pulse: (!) 106 (!) 102 78   Resp: (!) 21 (!) 22 15   Temp:    99.3 F (37.4 C)  TempSrc:    Axillary  SpO2: 96% 97% 96%   Weight:       CBC:  Recent Labs  Lab 08/15/20 2304 08/15/20 2310  WBC 7.8  --   HGB 14.9 15.0  HCT 45.0 44.0  MCV 98.7  --   PLT 218  --    Basic Metabolic Panel:  Recent Labs  Lab 08/15/20 2304 08/15/20 2310  NA 142 140  K 3.1* 3.1*  CL 106 107  CO2 24  --   GLUCOSE 115* 111*  BUN 10 11  CREATININE 0.73 0.60  CALCIUM 9.3  --    Lipid Panel:     Component Value Date/Time   CHOL 141 08/15/2020 2304   TRIG 49 08/15/2020 2304   HDL 73 08/15/2020 2304   CHOLHDL 1.9 08/15/2020 2304   VLDL 10 08/15/2020 2304   LDLCALC 58 08/15/2020 2304   LDLCALC 126 (H) 10/19/2017 1223   HgbA1c:  Lab Results  Component Value Date   HGBA1C 5.5 08/15/2020   Urine Drug Screen: No results found for: LABOPIA, COCAINSCRNUR, LABBENZ, AMPHETMU, THCU, LABBARB  Alcohol Level No results found for: [facility]  IMAGING CT HEAD WO CONTRAST  Result Date: 08/16/2020 CLINICAL DATA:  Follow-up examination for intracranial hemorrhage. EXAM: CT HEAD WITHOUT CONTRAST TECHNIQUE: Contiguous axial images were obtained from the base of the skull through the vertex without intravenous contrast. COMPARISON:  Prior CT from 01/16/2020. FINDINGS: Brain: Acute intraparenchymal hemorrhage centered at the left parieto-occipital region has expanded and is increased in size from previous, now  measuring 3.3 x 4.0 x 4.3 cm (estimated volume 28 cc). Surrounding vasogenic edema has increased, with increased mass effect on the adjacent left lateral ventricle. No appreciable intraventricular extension of blood. No significant left-to-right midline shift. Small volume adjacent subarachnoid hemorrhage noted. No appreciable extra-axial extension. No other new intracranial hemorrhage. No acute large vessel territory infarct elsewhere within the brain. No hydrocephalus or extra-axial fluid collection. Vascular: No hyperdense vessel. Skull: Stable without new or acute abnormality. Sinuses/Orbits: Globes and orbital soft tissues demonstrate no acute finding. Mild ethmoidal and maxillary sinus mucosal thickening. Mastoid air cells remain clear. Other: None. IMPRESSION: 1. Interval increase in size of acute intraparenchymal hemorrhage centered at the left parieto-occipital region, now measuring 3.3 x 4.0 x 4.3 cm (estimated volume 28 CC). Surrounding vasogenic edema has increased, with increased mass effect on the adjacent left lateral ventricle. No significant left-to-right midline shift. 2. No other new acute intracranial abnormality. Electronically Signed   By: Jeannine Boga M.D.   On: 08/16/2020 23:51   MR BRAIN W WO CONTRAST  Result Date: 08/16/2020 CLINICAL DATA:  Headache with intracranial hemorrhage suspected EXAM: MRI HEAD WITHOUT AND WITH CONTRAST TECHNIQUE: Multiplanar, multiecho pulse sequences of the brain and surrounding structures were obtained without and with intravenous  contrast. CONTRAST:  8mL GADAVIST GADOBUTROL 1 MMOL/ML IV SOLN COMPARISON:  CT from yesterday FINDINGS: Brain: Acute hematoma in the left occipital lobe with rim of adjacent edema. Maximal dimensions on T2 weighted imaging are 3 cm, similar to before although there could be some greater lobulation towards the cortex posteriorly and superiorly. Mild chronic white matter disease. On gradient imaging there are numerous remote  lobar and bilateral cerebellar microhemorrhages with relative sparing of the deep gray nuclei. No mass or clear vascular malformation seen at the level of the hemorrhage. On sagittal postcontrast imaging there are a few visible vessels but no dilated vessel or tangle seen on axial slices. Small acute or subacute cortical infarct along the posterior right frontal and left frontal parietal cortex Vascular: Major vessels are enhancing, including the superior sagittal sinus dominant left cerebral veins Skull and upper cervical spine: Normal marrow signal Sinuses/Orbits: Bilateral cataract resection IMPRESSION: 1. Acute left occipital parietal hematoma. Stable maximal dimension when compared to head CT earlier this admission, although there may be greater dissection of blood along the superior and posterior aspect of the hematoma. 2. Numerous remote micro hemorrhages, presumed amyloid angiopathy. No masslike or concerning vascular enhancement underlying the hemorrhage. 3. Small acute or subacute infarcts at the bilateral cerebral cortex. Electronically Signed   By: Monte Fantasia M.D.   On: 08/16/2020 11:57     PHYSICAL EXAM   Frail elderly Caucasian lady not in distress. . Afebrile. Head is nontraumatic. Neck is supple without bruit.    Cardiac exam no murmur or gallop. Lungs are clear to auscultation. Distal pulses are well felt. Neurological Exam : Patient is drowsy but can be aroused and opens eyes.  She mumbles mostly nonsensical staff.  She is disoriented to time place and person.  She follows barely few simple midline and one-step commands.  Pupils equal reactive.  Does not blink to threat on right side but does so on the left.  .  Doll's eye movements are sluggish.  Face is symmetric.  Tongue midline.  Motor system exam able to move all 4 extremities of the bed purposefully but not cooperative for detailed exam.  Sensation appears preserved.  Plantars downgoing.  Gait not tested   ASSESSMENT/PLAN Ms.  HEDAYA LATENDRESSE is a 84 y.o. female with history of right breast CA, HTN, hypothyroidism, pre-diabetes, recurrent UTI and prior stroke who presents by EMS with aphasia, BP en route 226/115., and a severe headache. A STAT head CT revealed a left parieto-occipital acute ICH. She did not receive IV t-PA due to [place].  Stroke: left parieto-occipital acute ICH  Resultant altered mental status and right hemianopsia  Code Stroke CT Head - Acute intraparenchymal hemorrhage centered at the right parieto-occipital region, estimated volume 13 cc. Minimal surrounding edema without significant regional mass effect.   MRI w/w/o head L occipital parietal ICH, stable. numerous microhemorrhages, presume amyloid. Small acute/subacute B cerebral cortex infarcts  CT head 9/26 increased size L parieto-occipital hemorrhage w/ vasogenic edema and mass effect lateral ventricle. No midline shift.   CT head 9/27 stable hemorrhage, trace SAH. Possible new mild R scalp hematoma, no fx   2D Echo - pending    Check EEG generalized and L lateral slowing  [employer] Virus 2 - negative  LDL - 58  HgbA1c - 5.5  VTE prophylaxis - SCDs  No antithrombotic prior to admission, now on No antithrombotic given hemorrhage   Therapy recommendations:  SNF  Disposition:  Pending  Cerebral edema  CT  w/ increasing edema  Started on 3% protocol  3% drip at 50, will increase to 75h now   Na 142->140->146  Continue q6h checks.   Hypertension  Home BP meds: amlodipine ; Losartan   On Cleviprex  Stable . SBP goal < 140 mm Hg ->160 this am . Wean Cleviprex as able . Prn labetolol and hydralaizine . Long-term BP goal normotensive  Hyperlipidemia  Home Lipid lowering medication: Lipitor 40 mg daily  LDL 58, goal < 70  Current lipid lowering medication: None (statin contraindicated with ICH)  Consider continuation of statin at discharge  Pre Diabetes  Home diabetic meds: none  Current diabetic meds:  none  HgbA1c 5.5, goal < 7.0  Dysphagia . Secondary to stroke . NPO . Speech on board   Other Stroke Risk Factors  Advanced age  Overweight, Body mass index is 28.7 kg/m., recommend weight loss, diet and exercise as appropriate   Family hx stroke (sister)   Hx stroke/TIA  Other Active Problems  Code status - DNR  Hypokalemia - 3.1->3.1  - supplement w/ 4 runs yesterday and 4 more today - recheck in am   Family contact: Doyle Askew (daughter) at [phone]  UA - pending     Hospital day # 2 Patient presented with left parieto-occipital hemorrhage and follow-up scan from yesterday showed slight increase in edema and mass-effect.  Continue hypertonic saline with serum sodium goal between 150-155.  Continue strict blood pressure control with systolic goal below 353.  Speech therapy to check swallow eval.  Long discussion with daughter at the bedside and answered questions.  Check follow-up CT later this morning. This patient is critically ill and at significant risk of neurological worsening, death and care requires constant monitoring of vital signs, hemodynamics,respiratory and cardiac monitoring, extensive review of multiple databases, frequent neurological assessment, discussion with family, other specialists and medical decision making of high complexity.I have made any additions or clarifications directly to the above note.This critical care time does not reflect procedure time, or teaching time or supervisory time of PA/NP/Med Resident etc but could involve care discussion time.  I spent 30 minutes of neurocritical care time  in the care of  this patient.    Antony Contras, MD  To contact Stroke Continuity provider, please refer to [email or web address] After hours, contact General Neurology

## 2020-08-17 NOTE — Progress Notes (Signed)
Delay in obtaining labs. This RN was unaware that phlebotomy was unable to obtain labs at bedside.  NP Burnetta Sabin made aware of delay.  Labs now sent off.

## 2020-08-17 NOTE — Procedures (Signed)
Patient Name: Tara [name]  MRN: [id number]  Epilepsy Attending: Lora Havens  Referring Physician/Provider: Burnetta Sabin, NP Date: 08/17/2020 Duration:   Patient history: 84 year old female with left parieto-occipital hemorrhage.  EEG to evaluate for seizures.  Level of alertness: comatose/lethargic  AEDs during EEG study: None  Technical aspects: This EEG study was done with scalp electrodes positioned according to the 10-20 International system of electrode placement. Electrical activity was acquired at a sampling rate of 500Hz  and reviewed with a high frequency filter of 70Hz  and a low frequency filter of 1Hz . EEG data were recorded continuously and digitally stored.   Description: EEG showed continuous generalized and lateralized left hemisphere 3 to 6 Hz theta-delta slowing. Hyperventilation and photic stimulation were not performed.     ABNORMALITY -Continuous slow, generalized and lateralized left hemisphere  IMPRESSION: This study is suggestive of cortical dysfunction left hemisphere likely secondary to underlying hemorrhage as well as severe diffuse encephalopathy, nonspecific etiology. No seizures or epileptiform discharges were seen throughout the recording.  Haizley Cannella Barbra Sarks

## 2020-08-17 NOTE — Progress Notes (Signed)
Spoke with NP Burnetta Sabin and informed of pt's agitation and difficulty in obtaining accurate BP due to pt moving. Pt BP running high. PRNs given. Will continue to monitor.

## 2020-08-17 NOTE — Progress Notes (Signed)
OT Cancellation Note  Patient Details Name: Tara [name] MRN: [id number] DOB: 09-20-1936   Cancelled Treatment:    Reason Eval/Treat Not Completed: Other (comment). Spoke with RN who reports pt just got calmed down from being very restless during her bath and feels it would be better for pt to rest right now. We will check back in AM.  [place], OTR/L Acute Rehab Services Pager [phone] Office [phone]     Almon Register 08/17/2020, 2:47 PM

## 2020-08-18 ENCOUNTER — Inpatient Hospital Stay (HOSPITAL_COMMUNITY): Payer: Medicare Other

## 2020-08-18 DIAGNOSIS — I6389 Other cerebral infarction: Secondary | ICD-10-CM | POA: Diagnosis not present

## 2020-08-18 DIAGNOSIS — I610 Nontraumatic intracerebral hemorrhage in hemisphere, subcortical: Secondary | ICD-10-CM | POA: Diagnosis not present

## 2020-08-18 LAB — BASIC METABOLIC PANEL
Anion gap: 9 (ref 5–15)
BUN: 18 mg/dL (ref 8–23)
BUN: 22 mg/dL (ref 8–23)
CO2: 20 mmol/L — ABNORMAL LOW (ref 22–32)
CO2: 17 mmol/L — ABNORMAL LOW (ref 22–32)
Calcium: 8.9 mg/dL (ref 8.9–10.3)
Calcium: 9 mg/dL (ref 8.9–10.3)
Chloride: 120 mmol/L — ABNORMAL HIGH (ref 98–111)
Chloride: >130 mmol/L (ref 98–111)
Creatinine, Ser: 0.66 mg/dL (ref 0.44–1.00)
Creatinine, Ser: 0.73 mg/dL (ref 0.44–1.00)
GFR calc Af Amer: >60 mL/min (ref >60)
GFR calc Af Amer: >60 mL/min (ref >60)
GFR calc non Af Amer: >60 mL/min (ref >60)
GFR calc non Af Amer: >60 mL/min (ref >60)
Glucose, Bld: 125 mg/dL — ABNORMAL HIGH (ref 70–99)
Glucose, Bld: 131 mg/dL — ABNORMAL HIGH (ref 70–99)
Potassium: 3.3 mmol/L — ABNORMAL LOW (ref 3.5–5.1)
Potassium: 3.4 mmol/L — ABNORMAL LOW (ref 3.5–5.1)
Sodium: 149 mmol/L — ABNORMAL HIGH (ref 135–145)
Sodium: 158 mmol/L — ABNORMAL HIGH (ref 135–145)

## 2020-08-18 LAB — CBC
HCT: 41.5 % (ref 36.0–46.0)
Hemoglobin: 14.1 g/dL (ref 12.0–15.0)
MCH: 33.3 pg (ref 26.0–34.0)
MCHC: 34 g/dL (ref 30.0–36.0)
MCV: 97.9 fL (ref 80.0–100.0)
Platelets: 145 10*3/uL — ABNORMAL LOW (ref 150–400)
RBC: 4.24 MIL/uL (ref 3.87–5.11)
RDW: 13.3 % (ref 11.5–15.5)
WBC: 10.3 10*3/uL (ref 4.0–10.5)
nRBC: 0 % (ref 0.0–0.2)

## 2020-08-18 LAB — ECHOCARDIOGRAM COMPLETE
Area-P 1/2: 3.33 cm2
P 1/2 time: 376 msec
S' Lateral: 2.4 cm
Weight: 2592.61 oz

## 2020-08-18 MED ORDER — HALOPERIDOL LACTATE 5 MG/ML IJ SOLN
1.0000 mg | Freq: Once | INTRAMUSCULAR | Status: AC
  Administered 2020-08-18: 1 mg via INTRAVENOUS
  Filled 2020-08-18: qty 1

## 2020-08-18 MED ORDER — OSMOLITE 1.2 CAL PO LIQD: 1000.0000 mL | ORAL | Status: DC

## 2020-08-18 NOTE — Evaluation (Signed)
Occupational Therapy Evaluation Patient Details Name: Tara [name] MRN: [id number] DOB: [birth year] Today's Date: 08/18/2020    History of Present Illness Kiandra W Bauer is an 84 y.o. female with a PMHx of right breast CA, HTN, hypothyroidism, pre-diabetes, recurrent UTI and prior stroke who  Demonstrating receptive and expressive aphasia, headache. head CT revealed a left parieto-occipital acute ICH.   Clinical Impression   This 84 y/o female presents with the above. PTA pt living in ILF, per chart was mod independent with ADL and mobility using RW. Pt currently with impaired cognition, R side weakness, R visuoperceptual deficits, increased tone and poor sitting balance impacting her functional performance. Pt requiring two person assist for safe completion of bed mobility today, attempted EOB however only able to maintain for very short period as pt with strong extensor tone/posturing and HR up to 130bpm while seated. Returned to supine and gradually repositioned pt via bed egress to chair position to promote trunk flexion. She is overall totalA for ADL at this time. Pt to benefit from continued acute OT services and currently recommend post acute rehab services to progress her overall safety and independence with ADL and mobility.     Follow Up Recommendations  SNF    Equipment Recommendations  Other (comment);3 in 1 bedside commode;Wheelchair (measurements OT);Wheelchair cushion (measurements OT) (TBD)           Precautions / Restrictions Precautions Precautions: Fall Precaution Comments: significant extensor posturing Restrictions Weight Bearing Restrictions: No      Mobility Bed Mobility Overal bed mobility: Needs Assistance Bed Mobility: Supine to Sit;Rolling Rolling: +2 for physical assistance;Total assist   Supine to sit: +2 for physical assistance;Total assist     General bed mobility comments: was already diagonal across bed with head to R and feet to left so  assisted legs off bed and lifted trunk with +2 A, but pt rigid in trunk and sliding off EOB so repositioned hips and worked to give support, and break tone, at EOB, but HR up to 130's so returned to supine  Transfers                      Balance Overall balance assessment: Needs assistance Sitting-balance support: Feet supported Sitting balance-Leahy Scale: Zero Sitting balance - Comments: pushing back into extension, able to sit for few seconds working to break up tone, but HR up to 130's so returned to supine                                   ADL either performed or assessed with clinical judgement   ADL Overall ADL's : Needs assistance/impaired     Grooming: Wash/dry face;Maximal assistance;Total assistance;Bed level Grooming Details (indicate cue type and reason): max hand over hand via RUE and support at hand initially progressed to support at distal elbow                                General ADL Comments: pt overall totalA for ADL at this time      Vision   Vision Assessment?: Vision impaired- to be further tested in functional context Additional Comments: noted pt responds to voice of therapist placed on her R side but she does not track towards it     Perception Perception Perception Tested?: Yes Comments: R side visual and overall perceptual deficits,  will continue to assess    Praxis      Pertinent Vitals/Pain Pain Assessment: Faces Faces Pain Scale: Hurts a little bit Pain Location: generalized Pain Descriptors / Indicators: Restless Pain Intervention(s): Monitored during session;Repositioned     Hand Dominance Right   Extremity/Trunk Assessment Upper Extremity Assessment Upper Extremity Assessment: Generalized weakness;Difficult to assess due to impaired cognition;RUE deficits/detail RUE Deficits / Details: pt moving both extremities but typically not purposeful, noted increased RUE weakness compared to LUE RUE  Coordination: decreased gross motor;decreased fine motor   Lower Extremity Assessment Lower Extremity Assessment: Defer to PT evaluation       Communication Communication Communication: Expressive difficulties;Receptive difficulties   Cognition Arousal/Alertness: Awake/alert Behavior During Therapy: Restless Overall Cognitive Status: Difficult to assess                                 General Comments: limited command following, daughter in room and reports pt has put a couple words together today with speech and did swallow a couple ice chips.   General Comments  daughter in room and supportive, patient gripping bedsheets with L hand and tense, able to break tone some in supine and calmer when hands brought together to midline; time spent positioning in chair position end of session for improved flexion and pt relaxing into bed    Exercises Other Exercises Other Exercises: trunk rocking, stretching into flexion proximal to distal for tone inhibiting techniques   Shoulder Instructions      Home Living Family/patient expects to be discharged to:: Other (Comment) (ILF)     Type of Home: Independent living facility                       Home Equipment: Walker - 2 wheels   Additional Comments: daughter provides history as pt unable  Lives With: Alone    Prior Functioning/Environment Level of Independence: Independent with assistive device(s)        Comments: ambulates with use of RW        OT Problem List: Decreased strength;Decreased range of motion;Decreased activity tolerance;Impaired vision/perception;Impaired balance (sitting and/or standing);Decreased coordination;Decreased cognition;Decreased safety awareness;Decreased knowledge of precautions;Impaired UE functional use      OT Treatment/Interventions: Self-care/ADL training;Therapeutic exercise;Energy conservation;DME and/or AE instruction;Therapeutic activities;Cognitive  remediation/compensation;Patient/family education;Balance training;Visual/perceptual remediation/compensation    OT Goals(Current goals can be found in the care plan section) Acute Rehab OT Goals Patient Stated Goal: pt unable to state, family goal to return to independent living OT Goal Formulation: With patient/family Time For Goal Achievement: 09/01/20 Potential to Achieve Goals: Good  OT Frequency: Min 2X/week   Barriers to D/C:            Co-evaluation PT/OT/SLP Co-Evaluation/Treatment: Yes Reason for Co-Treatment: For patient/therapist safety;To address functional/ADL transfers PT goals addressed during session: Mobility/safety with mobility;Balance OT goals addressed during session: Strengthening/ROM      AM-PAC OT "6 Clicks" Daily Activity     Outcome Measure Help from another person eating meals?: Total Help from another person taking care of personal grooming?: Total Help from another person toileting, which includes using toliet, bedpan, or urinal?: Total Help from another person bathing (including washing, rinsing, drying)?: Total Help from another person to put on and taking off regular upper body clothing?: Total Help from another person to put on and taking off regular lower body clothing?: Total 6 Click Score: 6   End of Session Nurse  Communication: Mobility status  Activity Tolerance: Patient tolerated treatment well Patient left: in bed;with call bell/phone within reach;with restraints reapplied;with family/visitor present  OT Visit Diagnosis: Other abnormalities of gait and mobility (R26.89);Other symptoms and signs involving the nervous system (R29.898);Other symptoms and signs involving cognitive function                Time: 1206-1231 OT Time Calculation (min): 25 min Charges:  OT General Charges $OT Visit: 1 Visit OT Evaluation $OT Eval High Complexity: 1 High  Lou Cal, OT Acute Rehabilitation Services Pager [phone] Office  [phone]    Raymondo Band 08/18/2020, 3:22 PM

## 2020-08-18 NOTE — Progress Notes (Signed)
STROKE TEAM PROGRESS NOTE   INTERVAL HISTORY Patient appears slightly more alert today but continues to speak very few words and mumbled speech.  She does not follow commands.  Vital signs are stable.  Blood pressure adequately controlled.  CT scan of the head done yesterday showed stable appearance of the hematoma and mild cytotoxic edema without worsening or hydrocephalus.  Neurological exam is unchanged  OBJECTIVE Vitals:   08/18/20 0745 08/18/20 0800 08/18/20 0830 08/18/20 0845  BP: (!) 145/56  (!) 149/60 (!) 144/50  Pulse: 77  85 81  Resp: 18   20  Temp:  98.4 F (36.9 C)    TempSrc:  Axillary    SpO2: 96%  96% 96%  Weight:       CBC:  Recent Labs  Lab 08/17/20 1325 08/17/20 2307  WBC 8.3 10.3  HGB 13.2 14.1  HCT 40.3 41.5  MCV 98.8 97.9  PLT 173 947*   Basic Metabolic Panel:  Recent Labs  Lab 08/17/20 1325 08/17/20 1325 08/17/20 1847 08/17/20 2307  NA 146*   < > 148* 149*  K 3.6  --   --  3.3*  CL 115*  --   --  120*  CO2 22  --   --  20*  GLUCOSE 117*  --   --  125*  BUN 12  --   --  18  CREATININE 0.66  --   --  0.66  CALCIUM 8.8*  --   --  8.9  MG 2.0  --   --   --    < > = values in this interval not displayed.   Lipid Panel:     Component Value Date/Time   CHOL 141 08/15/2020 2304   TRIG 49 08/15/2020 2304   HDL 73 08/15/2020 2304   CHOLHDL 1.9 08/15/2020 2304   VLDL 10 08/15/2020 2304   LDLCALC 58 08/15/2020 2304   LDLCALC 126 (H) 10/19/2017 1223   HgbA1c:  Lab Results  Component Value Date   HGBA1C 5.5 08/15/2020   Urine Drug Screen: No results found for: LABOPIA, COCAINSCRNUR, LABBENZ, AMPHETMU, THCU, LABBARB  Alcohol Level No results found for: [facility]  IMAGING CT HEAD WO CONTRAST  Result Date: 08/17/2020 CLINICAL DATA:  84 year old female presenting on 08/15/2020 with left parietal lobe hemorrhage. EXAM: CT HEAD WITHOUT CONTRAST TECHNIQUE: Contiguous axial images were obtained from the base of the skull through the vertex without  intravenous contrast. COMPARISON:  Head CT and brain MRI 08/16/2020 and earlier. FINDINGS: Brain: Heterogeneous posterior left hemisphere intra-axial hemorrhage centered along the left parieto-occipital sulcus encompasses 46 x 37 x 46 mm (AP by transverse by CC) for an estimated blood volume of 39 mL, stable since yesterday when measured using the same technique. Patchy surrounding edema appears stable. Stable regional mass effect since yesterday. No extension into the ventricular system, although trace subarachnoid space extension is suspected (series 3, image 24). Stable gray-white matter differentiation elsewhere. No superimposed acute cortically based infarct. No new area of intracranial bleeding. No ventriculomegaly. Basilar cisterns remain patent. Vascular: Calcified atherosclerosis at the skull base. Skull: Hyperostosis.  No acute osseous abnormality identified. Sinuses/Orbits: Visualized paranasal sinuses and mastoids are stable and well pneumatized. Other: A mild broad-based right vertex scalp hematoma has developed since 08/15/2020. Otherwise stable visible orbit and scalp soft tissues. IMPRESSION: 1. Stable intra-axial hemorrhage since yesterday, centered at the left parieto-occipital sulcus. Estimated blood volume of 39 mL (slight differences in measurement technique from yesterday). Stable mild regional edema and  mass effect. 2. Trace associated subarachnoid hemorrhage suspected, but no intraventricular extension or ventriculomegaly. 3. Possible new mild right vertex scalp hematoma. No skull fracture. Electronically Signed   By: Genevie Ann M.D.   On: 08/17/2020 11:58   EEG adult  Result Date: 08/17/2020 Lora Havens, MD     08/17/2020 11:01 AM Patient Name: Tara [name] MRN: [id number] Epilepsy Attending: Lora Havens Referring Physician/Provider: Burnetta Sabin, NP Date: 08/17/2020 Duration: Patient history: 84 year old female with left parieto-occipital hemorrhage.  EEG to evaluate for  seizures. Level of alertness: comatose/lethargic AEDs during EEG study: None Technical aspects: This EEG study was done with scalp electrodes positioned according to the 10-20 International system of electrode placement. Electrical activity was acquired at a sampling rate of 500Hz  and reviewed with a high frequency filter of 70Hz  and a low frequency filter of 1Hz . EEG data were recorded continuously and digitally stored. Description: EEG showed continuous generalized and lateralized left hemisphere 3 to 6 Hz theta-delta slowing. Hyperventilation and photic stimulation were not performed.   ABNORMALITY -Continuous slow, generalized and lateralized left hemisphere IMPRESSION: This study is suggestive of cortical dysfunction left hemisphere likely secondary to underlying hemorrhage as well as severe diffuse encephalopathy, nonspecific etiology. No seizures or epileptiform discharges were seen throughout the recording. Priyanka O Yadav     PHYSICAL EXAM    Frail elderly Caucasian lady not in distress. . Afebrile. Head is nontraumatic. Neck is supple without bruit.    Cardiac exam no murmur or gallop. Lungs are clear to auscultation. Distal pulses are well felt. Neurological Exam : Patient is drowsy but can be aroused and opens eyes.  She mumbles mostly nonsensical staff.   She follows barely few simple midline and one-step commands.  Pupils equal reactive.  Does not blink to threat on right side but does so on the left.  .  Doll's eye movements are sluggish.  Face is symmetric.  Tongue midline.  Motor system exam able to move all 4 extremities of the bed purposefully but not cooperative for detailed exam.  Sensation appears preserved.  Plantars downgoing.  Gait not tested   ASSESSMENT/PLAN Tara [name] is a 84 y.o. female with history of right breast CA, HTN, hypothyroidism, pre-diabetes, recurrent UTI and prior stroke who presents by EMS with aphasia, BP en route 226/115., and a severe headache. A  STAT head CT revealed a left parieto-occipital acute ICH. She did not receive IV t-PA due to [place].  Stroke: left parieto-occipital acute ICH  Resultant altered mental status and right hemianopsia  Code Stroke CT Head - Acute intraparenchymal hemorrhage centered at the right parieto-occipital region, estimated volume 13 cc. Minimal surrounding edema without significant regional mass effect.   MRI w/w/o head L occipital parietal ICH, stable. numerous microhemorrhages, presume amyloid. Small acute/subacute B cerebral cortex infarcts  CT head 9/26 increased size L parieto-occipital hemorrhage w/ vasogenic edema and mass effect lateral ventricle. No midline shift.   CT head 9/27 stable hemorrhage, trace SAH. Possible new mild R scalp hematoma, no fx   CT head 9/29 pending   2D Echo - pending    Check EEG generalized and L lateral slowing  [employer] Virus 2 - negative  LDL - 58  HgbA1c - 5.5  VTE prophylaxis - SCDs  No antithrombotic prior to admission, now on No antithrombotic given hemorrhage   Therapy recommendations:  SNF  Disposition:  Pending  Cerebral edema  CT w/ increasing edema  Started on 3% protocol  3% drip at 50, will increase to 75h now   Na 142->140->146->148->149->158   Discontinue 3%  Repeat CT head in am  Hypertension  Home BP meds: amlodipine ; Losartan   Remains On Cleviprex  Stable . SBP goal < 160 this am . Wean Cleviprex as able . Unable to keep BP low on Prn labetolol and hydralaizine . Resume oral agents once po access . Long-term BP goal normotensive  Hyperlipidemia  Home Lipid lowering medication: Lipitor 40 mg daily  LDL 58, goal < 70  Current lipid lowering medication: None (statin contraindicated with ICH)  Consider continuation of statin at discharge  Pre Diabetes  Home diabetic meds: none  Current diabetic meds: none  HgbA1c 5.5, goal < 7.0  Dysphagia . Secondary to stroke . NPO . Place Cortrak and start TF  tomorrow if remains unable to swallow (ordered) . Speech on board   Other Stroke Risk Factors  Advanced age  Overweight, Body mass index is 28.7 kg/m., recommend weight loss, diet and exercise as appropriate   Family hx stroke (sister)   Hx stroke/TIA  Other Active Problems  Code status - DNR  Hypokalemia - 3.1->3.1->3.4   - supplemented - follow   UA - neg     hyperchloridemia > 130 - monitor   Hospital day # 3 Patient neurological exam appears unchanged.  She is still unable to swallow.  Will need a pended tube passed tomorrow if she does not pass swallow eval.  Continue Cleviprex for now but use as needed labetalol and hydralazine and try to wean for systolic blood pressure goal below 160.  Long discussion of the bedside with the patient and daughter and answered questions.This patient is critically ill and at significant risk of neurological worsening, death and care requires constant monitoring of vital signs, hemodynamics,respiratory and cardiac monitoring, extensive review of multiple databases, frequent neurological assessment, discussion with family, other specialists and medical decision making of high complexity.I have made any additions or clarifications directly to the above note.This critical care time does not reflect procedure time, or teaching time or supervisory time of PA/NP/Med Resident etc but could involve care discussion time.  I spent 30 minutes of neurocritical care time  in the care of  this patient.    Antony Contras, MD  To contact Stroke Continuity provider, please refer to [email or web address] After hours, contact General Neurology

## 2020-08-18 NOTE — Progress Notes (Signed)
CRITICAL VALUE STICKER  CRITICAL VALUE: chloride >130  RECEIVER (on-site recipient of call):  DATE & TIME NOTIFIED: 08/18/20 @ 1045  MESSENGER (representative from lab):  MD NOTIFIED: Burnetta Sabin NP  TIME OF NOTIFICATION: 1050  RESPONSE: no new orders.

## 2020-08-18 NOTE — Progress Notes (Signed)
  Echocardiogram 2D Echocardiogram has been performed.  Fidel Levy 08/18/2020, 11:31 AM

## 2020-08-18 NOTE — Progress Notes (Signed)
  Speech Language Pathology Treatment: Dysphagia;Cognitive-Linquistic  Patient Details Name: Tara [name] MRN: [id number] DOB: August 28, 1936 Today's Date: 08/18/2020 Time: 1410-3013 SLP Time Calculation (min) (ACUTE ONLY): 30 min  Assessment / Plan / Recommendation Clinical Impression  Pt is much more alert today but also restless. She has significant expressive language deficits but frequently says "water" throughout PO trials. At times she will also say "I want more water" and at other times she will get stuck perseverating on "I want." Mod cues were provided for command following. Pt consumed ice water via straw with frequent throat clearing noted - less so noting delayed coughing. Pt's daughter says that throat clearing is a common behavior noted at baseline, especially when the pt is consuming cold items. Attempted to provide room temperature water or thicker consistencies, but pt is resistant and takes a minimal amount of either at a time, making assessment quite limited at bedside. Recommend starting with ice chips today and proceeding with MBS on next date if she can sustain this level of alertness.    HPI HPI: Patient is an 84 y.o. female resident at independent living facility with PMH: right breast CA, HTN, hypothyroidism, pre-diabetes, recurrent UTI, prior CVA, depression, arthritis, who presented to ED via EMS as code stroke. In ED, she exhibited receptive and expressive aphasia but able to communicate enough to state that she had a severe headache. CT revealed left parieto-occipital ICH.      SLP Plan  MBS       Recommendations  Diet recommendations: NPO;Other(comment) (ice chips) Medication Administration: Crushed with puree                Oral Care Recommendations: Oral care QID Follow up Recommendations: Inpatient Rehab;24 hour supervision/assistance SLP Visit Diagnosis: Dysphagia, unspecified (R13.10);Aphasia (R47.01) Plan: MBS       GO                 [name], M.A. [place] [facility] Pager [phone] Office [phone]  08/18/2020, 1:04 PM

## 2020-08-18 NOTE — Plan of Care (Signed)
  Problem: Self-Care: Goal: Ability to participate in self-care as condition permits will improve Outcome: Progressing Goal: Ability to communicate needs accurately will improve Outcome: Progressing   Problem: Clinical Measurements: Goal: Will remain free from infection Outcome: Progressing   Problem: Activity: Goal: Risk for activity intolerance will decrease Outcome: Progressing   Problem: Elimination: Goal: Will not experience complications related to bowel motility Outcome: Progressing Goal: Will not experience complications related to urinary retention Outcome: Progressing   Problem: Pain Managment: Goal: General experience of comfort will improve Outcome: Progressing   Problem: Skin Integrity: Goal: Risk for impaired skin integrity will decrease Outcome: Progressing   Problem: Nutrition: Goal: Adequate nutrition will be maintained Outcome: Not Progressing

## 2020-08-18 NOTE — Progress Notes (Signed)
Physical Therapy Treatment Patient Details Name: LASONJA LAKINS MRN: [id number] DOB: [birth year] Today's Date: 08/18/2020    History of Present Illness Shaniah W Barber is an 84 y.o. female with a PMHx of right breast CA, HTN, hypothyroidism, pre-diabetes, recurrent UTI and prior stroke who  Demonstrating receptive and expressive aphasia, headache. head CT revealed a left parieto-occipital acute ICH.    PT Comments    Patient progressing some this session, able to sit and work to inhibit extensor tone some, but HR elevation into 130's so once supine able to work on inhibiting tone and positioned for improved flexion.  She was more alert today and daughter reports pt able to put some words together.  PT to continue to follow.  Still at SNF level currently.  May progress to tolerate CIR.    Follow Up Recommendations  SNF     Equipment Recommendations  Wheelchair (measurements PT);Wheelchair cushion (measurements PT);Hospital bed    Recommendations for Other Services       Precautions / Restrictions Precautions Precautions: Fall Precaution Comments: significant extensor posturing    Mobility  Bed Mobility Overal bed mobility: Needs Assistance Bed Mobility: Supine to Sit;Rolling Rolling: +2 for physical assistance;Total assist   Supine to sit: +2 for physical assistance;Total assist     General bed mobility comments: was already diagonal across bed with head to R and feet to left so assisted legs off bed and lifted trunk with +2 A, but pt rigid in trunk and sliding off EOB so repositioned hips and worked to give support, and break tone, at EOB, but HR up to 130's so returned to supine  Transfers                    Ambulation/Gait                 Stairs             Wheelchair Mobility    Modified Rankin (Stroke Patients Only) Modified Rankin (Stroke Patients Only) Pre-Morbid Rankin Score: Slight disability Modified Rankin: Severe disability      Balance Overall balance assessment: Needs assistance Sitting-balance support: Feet supported Sitting balance-Leahy Scale: Zero Sitting balance - Comments: pushing back into extension, able to sit for few seconds working to break up tone, but HR up to 130's so returned to supine                                    Cognition Arousal/Alertness: Awake/alert Behavior During Therapy: Restless Overall Cognitive Status: Difficult to assess                                 General Comments: limited command following, daughter in room and reports pt has put a couple words together today with speech and did swallow a couple ice chips.      Exercises Other Exercises Other Exercises: trunk rocking, stretching into flexion proximal to distal for tone inhibiting techniques    General Comments General comments (skin integrity, edema, etc.): daughter in room and supportive, patient gripping bedsheets with L hand and tense, able to break tone some in supine and calmer when hands brought together to midline; time spent positioning in chair position end of session for improved flexion and pt relaxing into bed      Pertinent Vitals/Pain Pain Assessment: Faces Faces Pain Scale:  Hurts a little bit Pain Location: generalized Pain Descriptors / Indicators: Restless Pain Intervention(s): Monitored during session;Repositioned    Home Living                      Prior Function            PT Goals (current goals can now be found in the care plan section) Progress towards PT goals: Progressing toward goals    Frequency    Min 3X/week      PT Plan Current plan remains appropriate    Co-evaluation PT/OT/SLP Co-Evaluation/Treatment: Yes Reason for Co-Treatment: For patient/therapist safety;To address functional/ADL transfers PT goals addressed during session: Mobility/safety with mobility;Balance        AM-PAC PT "6 Clicks" Mobility   Outcome Measure   Help needed turning from your back to your side while in a flat bed without using bedrails?: Total Help needed moving from lying on your back to sitting on the side of a flat bed without using bedrails?: Total Help needed moving to and from a bed to a chair (including a wheelchair)?: Total Help needed standing up from a chair using your arms (e.g., wheelchair or bedside chair)?: Total Help needed to walk in hospital room?: Total Help needed climbing 3-5 steps with a railing? : Total 6 Click Score: 6    End of Session   Activity Tolerance: Treatment limited secondary to medical complications (Comment) (tachycardic) Patient left: in bed;with call bell/phone within reach;with family/visitor present;with bed alarm set;with restraints reapplied   PT Visit Diagnosis: Other abnormalities of gait and mobility (R26.89);Other symptoms and signs involving the nervous system (R29.898)     Time: 5638-9373 PT Time Calculation (min) (ACUTE ONLY): 26 min  Charges:  $Neuromuscular Re-education: 8-22 mins                     Magda Kiel, PT Acute Rehabilitation Services Pager:[phone] [phone] 08/18/2020    Reginia Naas 08/18/2020, 1:19 PM

## 2020-08-19 ENCOUNTER — Inpatient Hospital Stay (HOSPITAL_COMMUNITY): Payer: Medicare Other

## 2020-08-19 LAB — BASIC METABOLIC PANEL
Anion gap: 14 (ref 5–15)
BUN: 29 mg/dL — ABNORMAL HIGH (ref 8–23)
CO2: 16 mmol/L — ABNORMAL LOW (ref 22–32)
Calcium: 9.2 mg/dL (ref 8.9–10.3)
Chloride: 126 mmol/L — ABNORMAL HIGH (ref 98–111)
Creatinine, Ser: 0.95 mg/dL (ref 0.44–1.00)
GFR calc Af Amer: >60 mL/min (ref >60)
GFR calc non Af Amer: 56 mL/min — ABNORMAL LOW (ref >60)
Glucose, Bld: 125 mg/dL — ABNORMAL HIGH (ref 70–99)
Potassium: 3.4 mmol/L — ABNORMAL LOW (ref 3.5–5.1)
Sodium: 156 mmol/L — ABNORMAL HIGH (ref 135–145)

## 2020-08-19 LAB — CBC
HCT: 41.5 % (ref 36.0–46.0)
Hemoglobin: 13.8 g/dL (ref 12.0–15.0)
MCH: 33.7 pg (ref 26.0–34.0)
MCHC: 33.3 g/dL (ref 30.0–36.0)
MCV: 101.2 fL — ABNORMAL HIGH (ref 80.0–100.0)
Platelets: 158 10*3/uL (ref 150–400)
RBC: 4.1 MIL/uL (ref 3.87–5.11)
RDW: 14.2 % (ref 11.5–15.5)
WBC: 12.8 10*3/uL — ABNORMAL HIGH (ref 4.0–10.5)
nRBC: 0 % (ref 0.0–0.2)

## 2020-08-19 MED ORDER — ENSURE ENLIVE PO LIQD
237.0000 mL | Freq: Two times a day (BID) | ORAL | Status: DC
  Administered 2020-08-19 – 2020-08-23 (×6): 237 mL via ORAL

## 2020-08-19 MED ORDER — POTASSIUM CHLORIDE 20 MEQ PO PACK
40.0000 meq | PACK | Freq: Once | ORAL | Status: AC
  Administered 2020-08-19: 40 meq via ORAL
  Filled 2020-08-19: qty 2

## 2020-08-19 NOTE — Progress Notes (Addendum)
STROKE TEAM PROGRESS NOTE   INTERVAL HISTORY Patient seems more alert and interactive today. She is speaking a few words and can be understood with some difficulty. She is still needed by mouth and plans are to do modified barium swallow later this morning with speech therapy. Serum sodium is 156. Potassium is slightly low at 3.4. Vital signs are stable. Neuro exam is unchanged. Follow-up CT scan of the head shows stable appearance of the left parieto-occipital hemorrhage and cytotoxic edema. OBJECTIVE Vitals:   08/19/20 1210 08/19/20 1215 08/19/20 1230 08/19/20 1300  BP:  (!) 143/58 (!) 138/59 (!) 153/73  Pulse: 73 72 68 69  Resp: (!) 28 18 16 19   Temp: 98.1 F (36.7 C)     TempSrc:      SpO2: 99% 95% 95% 96%  Weight:       CBC:  Recent Labs  Lab 08/17/20 2307 08/19/20 0631  WBC 10.3 12.8*  HGB 14.1 13.8  HCT 41.5 41.5  MCV 97.9 101.2*  PLT 145* 161   Basic Metabolic Panel:  Recent Labs  Lab 08/17/20 1325 08/17/20 1847 08/18/20 0947 08/19/20 0631  NA 146*   < > 158* 156*  K 3.6   < > 3.4* 3.4*  CL 115*   < > >130* 126*  CO2 22   < > 17* 16*  GLUCOSE 117*   < > 131* 125*  BUN 12   < > 22 29*  CREATININE 0.66   < > 0.73 0.95  CALCIUM 8.8*   < > 9.0 9.2  MG 2.0  --   --   --    < > = values in this interval not displayed.   Lipid Panel:     Component Value Date/Time   CHOL 141 08/15/2020 2304   TRIG 49 08/15/2020 2304   HDL 73 08/15/2020 2304   CHOLHDL 1.9 08/15/2020 2304   VLDL 10 08/15/2020 2304   LDLCALC 58 08/15/2020 2304   LDLCALC 126 (H) 10/19/2017 1223   HgbA1c:  Lab Results  Component Value Date   HGBA1C 5.5 08/15/2020   Urine Drug Screen: No results found for: LABOPIA, COCAINSCRNUR, LABBENZ, AMPHETMU, THCU, LABBARB  Alcohol Level No results found for: ETH  IMAGING CT HEAD WO CONTRAST  Result Date: 08/19/2020 CLINICAL DATA:  Stroke follow-up EXAM: CT HEAD WITHOUT CONTRAST TECHNIQUE: Contiguous axial images were obtained from the base of the  skull through the vertex without intravenous contrast. COMPARISON:  Two days ago FINDINGS: Brain: Unchanged size and shape of the left occipital hematoma which measures up to 4 x 4 x 3.3 cm. There may be local subarachnoid extension, non progressed. The adjacent edema and sulcal effacement is unchanged. No interval hemorrhage, hydrocephalus, or infarct. Vascular: No hyperdense vessel or unexpected calcification. Skull: Normal. Negative for fracture or focal lesion. Sinuses/Orbits: No acute finding. IMPRESSION: Size stable left occipital hematoma with vasogenic edema. Electronically Signed   By: Monte Fantasia M.D.   On: 08/19/2020 07:13     PHYSICAL EXAM     Frail elderly Caucasian lady not in distress. . Afebrile. Head is nontraumatic. Neck is supple without bruit.    Cardiac exam no murmur or gallop. Lungs are clear to auscultation. Distal pulses are well felt. Neurological Exam : Patient is awake and alert she mumbles a few words and occasional short sentences.   She follows barely few simple midline and one-step commands.  Pupils equal reactive.  Does not blink to threat on right side but does so  on the left.  .  Doll's eye movements are sluggish.  Face is symmetric.  Tongue midline.  Motor system exam able to move all 4 extremities of the bed purposefully but not cooperative for detailed exam.  Sensation appears preserved.  Plantars downgoing.  Gait not tested   ASSESSMENT/PLAN [name] is a 84 y.o. female with history of right breast CA, HTN, hypothyroidism, pre-diabetes, recurrent UTI and prior stroke who presents by EMS with aphasia, BP en route 226/115., and a severe headache. A STAT head CT revealed a left parieto-occipital acute ICH. She did not receive IV t-PA due to [place].  Stroke: left parieto-occipital acute ICH  Resultant altered mental status and right hemianopsia  Code Stroke CT Head - Acute intraparenchymal hemorrhage centered at the right parieto-occipital region,  estimated volume 13 cc. Minimal surrounding edema without significant regional mass effect.   MRI w/w/o head L occipital parietal ICH, stable. numerous microhemorrhages, presume amyloid. Small acute/subacute B cerebral cortex infarcts  CT head 9/26 increased size L parieto-occipital hemorrhage w/ vasogenic edema and mass effect lateral ventricle. No midline shift.   CT head 9/27 stable hemorrhage, trace SAH. Possible new mild R scalp hematoma, no fx   CT head 9/29 stable L occipital ICH   2D Echo - EF 65-70%. No source of embolus   Check EEG generalized and L lateral slowing  [employer] Virus 2 - negative  LDL - 58  HgbA1c - 5.5  VTE prophylaxis - SCDs  No antithrombotic prior to admission, now on No antithrombotic given hemorrhage   Therapy recommendations:  SNF  Disposition:  Pending  Cerebral edema  CT w/ increasing edema  Started on 3% protocol  3% drip at 50, will increase to 75h now   Na 142->140->146->148->149->158->156   Off 3%  CT head 9/29 stable L occipital ICH  Allow Na to drip down  Hypertension  Home BP meds: amlodipine ; Losartan   Remains On Cleviprex  Stable . SBP goal < 160  . Unable to keep BP low on Prn labetolol and hydralaizine alone . Resumed cozaar 25 and norvasc 10 . Wean Cleviprex as able . Long-term BP goal normotensive  Hyperlipidemia  Home Lipid lowering medication: Lipitor 40 mg daily  LDL 58, goal < 70  Current lipid lowering medication: None (statin contraindicated with ICH)  Consider continuation of statin at discharge  Pre Diabetes  Home diabetic meds: none  Current diabetic meds: none  HgbA1c 5.5, goal < 7.0  Dysphagia . Secondary to stroke . NPO . Cortrak and start TF  . Speech on board   Other Stroke Risk Factors   Advanced age  Overweight, Body mass index is 28.7 kg/m., recommend weight loss, diet and exercise as appropriate   Family hx stroke (sister)   Hx stroke/TIA  Other Active  Problems  Code status - DNR  Hypokalemia - 3.1->3.1->3.4-3.4 - supplement once Cortrak placed as only 1 IV line (Cleviprex) - follow   UA - neg     hyperchloridemia > 130->126 - monitor, allow to drift down  Leukocytosis WBC 12.8 - monitor   Hospital day # 4 Plan: We'll let serum sodium gradually drift down not restart hypertonic saline as neuro exam appears to have improved. Speech therapy to check modified barium swallow today. Replace potassium. Mobilize out of bed. Transfer to neurology floor bed later today. Discussed with daughter at the bedside and answered questions. Greater than 50% time during this 35-minute visit was spent on counseling and  coordination of care answering questions and discussion with care team. Antony Contras, MD  To contact Stroke Continuity provider, please refer to [email or web address] After hours, contact General Neurology

## 2020-08-19 NOTE — Progress Notes (Signed)
Modified Barium Swallow Progress Note  Patient Details  Name: Tara [name] MRN: [id number] Date of Birth: 16-Sep-1936  Today's Date: 08/19/2020  Modified Barium Swallow completed.  Full report located under Chart Review in the Imaging Section.  Brief recommendations include the following:  Clinical Impression   Pt has an oral more than pharyngeal dysphagia, impacted in part by cognitive-linguistic status. She has reduced lingual control orally, allowing boluses to spill back to the pharynx haphazardly instead of more controlled. Thin liquids pool in her pyriform sinuses until she has cleared the rest of the liquid from her mouth. With larger sips this can fill her pyriform sinuses completely, but she still maintains good airway protection until she takes quite large, consecutive boluses via straw. Aspiration occurred x1 with a reflexive soft cough that brought the aspirate back out of her trachea but not out of her laryngeal vestibule. Pt had limited bolus acceptance with solids during this study and masticated the barium tablet even though she was cued multiple times not to do so. Recommend starting with Dys 3 diet and thin liquids. Suspect that adequate pacing and positioning will be most integral for safe PO intake.    Swallow Evaluation Recommendations       SLP Diet Recommendations: Dysphagia 3 (Mech soft) solids;Thin liquid   Liquid Administration via: Cup;Straw   Medication Administration: Whole meds with puree (may need to crush)   Supervision: Staff to assist with self feeding;Full supervision/cueing for compensatory strategies   Compensations: Minimize environmental distractions;Slow rate;Small sips/bites   Postural Changes: Seated upright at 90 degrees;Remain semi-upright after after feeds/meals (Comment)   Oral Care Recommendations: Oral care BID        [name], M.A. [place] Pager [phone] Office [phone]  08/19/2020,2:46  PM

## 2020-08-19 NOTE — Progress Notes (Signed)
Inpatient Rehab Admissions Coordinator Note:   Per PT recommendations, pt was screened for CIR candidacy by Shann Medal, PT, DPT.  At this time we are recommending a CIR consult and I will place an order per our protocol.  Please contact me with questions.   Shann Medal, PT, DPT [phone] 08/19/20 3:43 PM

## 2020-08-19 NOTE — Progress Notes (Signed)
Physical Therapy Treatment Patient Details Name: Tara [name] MRN: [id number] DOB: 1936/10/31 Today's Date: 08/19/2020    History of Present Illness Tara [name] is an 84 y.o. female with a PMHx of right breast CA, HTN, hypothyroidism, pre-diabetes, recurrent UTI and prior stroke who  Demonstrating receptive and expressive aphasia, headache. head CT revealed a left parieto-occipital acute ICH.    PT Comments    Pt tolerated treatment well, progressing to transfer training and out of bed mobility. Pt continues to demonstrate significant extensor posturing and R lateral lean with mobility initially. This improved with increased time sitting and physical assistance to promote upright posture. Pt remains a high falls risk due to weakness and impaired sense of midline. Pt follows commands more consistently this session and is better able to participate in skilled PT intervention. Due to pt progression and improved mobility PT updating recommendations to CIR at this time.  Follow Up Recommendations  CIR     Equipment Recommendations  Wheelchair (measurements PT);Wheelchair cushion (measurements PT);Hospital bed (mechanical lift, if home today)    Recommendations for Other Services Rehab consult     Precautions / Restrictions Precautions Precautions: Fall Precaution Comments: pushing to R Restrictions Weight Bearing Restrictions: No    Mobility  Bed Mobility Overal bed mobility: Needs Assistance Bed Mobility: Supine to Sit     Supine to sit: Max assist     General bed mobility comments: pt requires assistance to initiate LE mobility and significant assistance to elevate trunk into sitting position, pt with extensor tone and strong R lateral lean  Transfers Overall transfer level: Needs assistance Equipment used: 1 person hand held assist Transfers: Sit to/from [employer] Transfers Sit to Stand: Max assist Stand pivot transfers: Max assist       General  transfer comment: PT provides BUE support and R knee block, pt transfers to drop arm recliner. PT notes strong R lateral lean, but less significant than in sitting  Ambulation/Gait                 Stairs             Wheelchair Mobility    Modified Rankin (Stroke Patients Only) Modified Rankin (Stroke Patients Only) Pre-Morbid Rankin Score: Slight disability Modified Rankin: Severe disability     Balance Overall balance assessment: Needs assistance Sitting-balance support: Single extremity supported;Feet supported Sitting balance-Leahy Scale: Poor Sitting balance - Comments: maxA initially due to R lateral lean, progresses to minA Postural control: Right lateral lean Standing balance support: Bilateral upper extremity supported Standing balance-Leahy Scale: Zero Standing balance comment: maxA to maintain standing due to R lateral lean and LE weakness                            Cognition Arousal/Alertness: Awake/alert Behavior During Therapy: WFL for tasks assessed/performed Overall Cognitive Status: Impaired/Different from baseline Area of Impairment: Orientation;Attention;Memory;Following commands;Safety/judgement;Awareness;Problem solving                 Orientation Level: Disoriented to;Place;Time Current Attention Level: Focused Memory: Decreased recall of precautions;Decreased short-term memory Following Commands: Follows one step commands with increased time Safety/Judgement: Decreased awareness of safety;Decreased awareness of deficits Awareness: Intellectual Problem Solving: Slow processing;Requires verbal cues;Requires tactile cues        Exercises      General Comments General comments (skin integrity, edema, etc.): VSS on RA      Pertinent Vitals/Pain Pain Assessment: Faces Faces Pain Scale: No  hurt Pain Location: pt says she is "not in pain" but "uncomfortable" Pain Descriptors / Indicators: Discomfort Pain  Intervention(s): Monitored during session;Repositioned    Home Living                      Prior Function            PT Goals (current goals can now be found in the care plan section) Acute Rehab PT Goals Patient Stated Goal: pt unable to state, family goal to return to independent living Progress towards PT goals: Progressing toward goals    Frequency    Min 4X/week      PT Plan Current plan remains appropriate;Frequency needs to be updated    Co-evaluation              AM-PAC PT "6 Clicks" Mobility   Outcome Measure  Help needed turning from your back to your side while in a flat bed without using bedrails?: Total Help needed moving from lying on your back to sitting on the side of a flat bed without using bedrails?: Total Help needed moving to and from a bed to a chair (including a wheelchair)?: Total Help needed standing up from a chair using your arms (e.g., wheelchair or bedside chair)?: Total Help needed to walk in hospital room?: Total Help needed climbing 3-5 steps with a railing? : Total 6 Click Score: 6    End of Session   Activity Tolerance: Patient tolerated treatment well Patient left: in chair;with call bell/phone within reach;with chair alarm set Nurse Communication: Mobility status;Need for lift equipment PT Visit Diagnosis: Other abnormalities of gait and mobility (R26.89);Other symptoms and signs involving the nervous system (R29.898)     Time: 1916-6060 PT Time Calculation (min) (ACUTE ONLY): 29 min  Charges:  $Therapeutic Activity: 23-37 mins                     Zenaida Niece, PT, DPT Acute Rehabilitation Pager: [phone]    Zenaida Niece 08/19/2020, 3:12 PM

## 2020-08-19 NOTE — Progress Notes (Signed)
Initial Nutrition Assessment  DOCUMENTATION CODES:   Not applicable  INTERVENTION:   Ensure Enlive po BID, each supplement provides 350 kcal and 20 grams of protein    NUTRITION DIAGNOSIS:   Inadequate oral intake related to acute illness as evidenced by per patient/family report, NPO status.  GOAL:   Patient will meet greater than or equal to 90% of their needs  MONITOR:   PO intake, Supplement acceptance, Labs, Weight trends  REASON FOR ASSESSMENT:   Consult Enteral/tube feeding initiation and management  ASSESSMENT:   84 yo female admitted with left acute parieto-occipital ICH. PMH includes HTN, pre DM, hx of prior stroke  SLP evaluated and diet advanced to Dysphagia 3, Thins.  Cortrak tube order in place but plan to hold off currently and assess for need on next available Cortrak day (Friday)  Sodium currently 156, 3% NS discontinued  Pt remains on cleviprex, provides some additional calories  Unable to obtain diet and weight history from patient at this time  Current wt 73.5 kg; no weight since admission  Labs: sodium 156 (H), chloride 126 (H), potassium 3.4 (L), Creatinine wdl Meds: cholecalciferol  Diet Order:   Diet Order            DIET DYS 3 Room service appropriate? Yes with Assist; Fluid consistency: Thin  Diet effective now                 EDUCATION NEEDS:   Not appropriate for education at this time  Skin:  Skin Assessment: Skin Integrity Issues: Skin Integrity Issues:: Other (Comment) Other: device related pressure injury to bilateral wrists  Last BM:  9/29  Height:   Ht Readings from Last 1 Encounters:  05/29/19 5\' 3"  (1.6 m)    Weight:   Wt Readings from Last 1 Encounters:  08/15/20 73.5 kg    BMI:  Body mass index is 28.7 kg/m.  Estimated Nutritional Needs:   Kcal:  1600-1800 kcals  Protein:  80-90 g  Fluid:  >/= 1.6 L   Kerman Passey MS, RDN, LDN, CNSC Registered Dietitian III Clinical Nutrition RD Pager  and On-Call Pager Number Located in [place]

## 2020-08-20 ENCOUNTER — Ambulatory Visit: Payer: Medicare Other | Admitting: Podiatry

## 2020-08-20 DIAGNOSIS — L8990 Pressure ulcer of unspecified site, unspecified stage: Secondary | ICD-10-CM | POA: Insufficient documentation

## 2020-08-20 DIAGNOSIS — E039 Hypothyroidism, unspecified: Secondary | ICD-10-CM | POA: Diagnosis not present

## 2020-08-20 DIAGNOSIS — E7849 Other hyperlipidemia: Secondary | ICD-10-CM | POA: Diagnosis not present

## 2020-08-20 DIAGNOSIS — I10 Essential (primary) hypertension: Secondary | ICD-10-CM | POA: Diagnosis not present

## 2020-08-20 LAB — CBC
HCT: 41.4 % (ref 36.0–46.0)
Hemoglobin: 13.6 g/dL (ref 12.0–15.0)
MCH: 32.5 pg (ref 26.0–34.0)
MCHC: 32.9 g/dL (ref 30.0–36.0)
MCV: 98.8 fL (ref 80.0–100.0)
Platelets: 158 10*3/uL (ref 150–400)
RBC: 4.19 MIL/uL (ref 3.87–5.11)
RDW: 14.2 % (ref 11.5–15.5)
WBC: 12.1 10*3/uL — ABNORMAL HIGH (ref 4.0–10.5)
nRBC: 0 % (ref 0.0–0.2)

## 2020-08-20 LAB — BASIC METABOLIC PANEL
Anion gap: 10 (ref 5–15)
BUN: 28 mg/dL — ABNORMAL HIGH (ref 8–23)
CO2: 21 mmol/L — ABNORMAL LOW (ref 22–32)
Calcium: 8.8 mg/dL — ABNORMAL LOW (ref 8.9–10.3)
Chloride: 125 mmol/L — ABNORMAL HIGH (ref 98–111)
Creatinine, Ser: 0.81 mg/dL (ref 0.44–1.00)
GFR calc Af Amer: >60 mL/min (ref >60)
GFR calc non Af Amer: >60 mL/min (ref >60)
Glucose, Bld: 116 mg/dL — ABNORMAL HIGH (ref 70–99)
Potassium: 3.1 mmol/L — ABNORMAL LOW (ref 3.5–5.1)
Sodium: 156 mmol/L — ABNORMAL HIGH (ref 135–145)

## 2020-08-20 MED ORDER — LOSARTAN POTASSIUM 50 MG PO TABS
50.0000 mg | ORAL_TABLET | Freq: Every day | ORAL | Status: DC
  Administered 2020-08-21 – 2020-08-27 (×7): 50 mg via ORAL
  Filled 2020-08-20 (×7): qty 1

## 2020-08-20 MED ORDER — POTASSIUM CHLORIDE 20 MEQ PO PACK
20.0000 meq | PACK | Freq: Three times a day (TID) | ORAL | Status: DC
  Administered 2020-08-20: 20 meq via ORAL
  Filled 2020-08-20: qty 1

## 2020-08-20 MED ORDER — POTASSIUM CHLORIDE 20 MEQ/15ML (10%) PO SOLN
20.0000 meq | Freq: Three times a day (TID) | ORAL | Status: AC
  Administered 2020-08-20 – 2020-08-21 (×2): 20 meq via ORAL
  Filled 2020-08-20 (×2): qty 15

## 2020-08-20 MED ORDER — LOSARTAN POTASSIUM 50 MG PO TABS
25.0000 mg | ORAL_TABLET | Freq: Once | ORAL | Status: AC
  Administered 2020-08-20: 25 mg via ORAL
  Filled 2020-08-20: qty 1

## 2020-08-20 NOTE — Progress Notes (Signed)
  Speech Language Pathology Treatment: Dysphagia;Cognitive-Linquistic  Patient Details Name: Tara [name] MRN: [id number] DOB: 1936/08/20 Today's Date: 08/20/2020 Time: 7290-2111 SLP Time Calculation (min) (ACUTE ONLY): 19 min  Assessment / Plan / Recommendation Clinical Impression  Pt is producing more verbal language, although still c/b anomia and perseveration. Pt was able to name her two children, but inaccurately recalling the number of grandchildren she has despite cues and only remembering two names correctly (50% accuracy). She has reduced awareness with PO intake, not sure how to initiate during transitions between solid and liquid boluses. SLP provided hand-over-hand assist with self-feeding that helped, as well as additional cues to open her mouth and begin chewing for solid boluses. Pt could initiate oral prep with the next bolus more automatically if it was of the same texture. Reviewed strategies to assist with swallowing and adequate intake with son. Will continue current diet and continue to follow.     HPI HPI: Patient is an 84 y.o. female resident at independent living facility with PMH: right breast CA, HTN, hypothyroidism, pre-diabetes, recurrent UTI, prior CVA, depression, arthritis, who presented to ED via EMS as code stroke. In ED, she exhibited receptive and expressive aphasia but able to communicate enough to state that she had a severe headache. CT revealed left parieto-occipital ICH.      SLP Plan  Continue with current plan of care       Recommendations  Diet recommendations: Dysphagia 3 (mechanical soft);Thin liquid Liquids provided via: Cup;Straw Medication Administration: Whole meds with puree (may need to crush) Supervision: Staff to assist with self feeding;Full supervision/cueing for compensatory strategies Compensations: Minimize environmental distractions;Slow rate;Small sips/bites Postural Changes and/or Swallow Maneuvers: Seated upright 90 degrees                 Oral Care Recommendations: Oral care BID Follow up Recommendations: Inpatient Rehab SLP Visit Diagnosis: Dysphagia, oral phase (R13.11);Aphasia (R47.01) Plan: Continue with current plan of care       GO                Tara [name]., M.A. [place] [facility] Pager [phone] Office [phone]  08/20/2020, 10:53 AM

## 2020-08-20 NOTE — Progress Notes (Signed)
Inpatient Rehab Admissions:  Inpatient Rehab Consult received.  I met with patient at the bedside for rehabilitation assessment and to discuss goals and expectations of an inpatient rehab admission.  She is easily distracted and her nurse was trying to give mornng meds.  I will f/u with pt's son to discuss CIR and dispo.   Signed: Shann Medal, PT, DPT Admissions Coordinator [phone] 08/20/20  4:36 PM

## 2020-08-20 NOTE — Progress Notes (Signed)
Physical Therapy Treatment Patient Details Name: Tara [name] MRN: [id number] DOB: [name] 30, 1936 Today's Date: 08/20/2020    History of Present Illness Tara [name] is an 84 y.o. female with a PMHx of right breast CA, HTN, hypothyroidism, pre-diabetes, recurrent UTI and prior stroke who  Demonstrating receptive and expressive aphasia, headache. head CT revealed a left parieto-occipital acute ICH.    PT Comments    Pt tolerates treatment well. Pt continues to demonstrate significant extensor tone this session but does not push toward R side. Pt responds well to external cues to promote anterior trunk lean and reduce extensor tone with mobility. Pt does continue to remain perseverative during session and requires increased time to provess commands. Pt continues to require significant physical assistance for all functional mobility at this time due to impaired strength, balance, and awareness. Pt will benefit from continued acute PT POC to reduce falls risk and caregiver burden. PT continues to recommend CIR placement at this time as the pt demonstrates the ability to make significant functional gains with continued high intensity inpatient PT services.  Follow Up Recommendations  CIR     Equipment Recommendations  Wheelchair (measurements PT);Wheelchair cushion (measurements PT);Hospital bed (mechanical lift, all if home today)    Recommendations for Other Services       Precautions / Restrictions Precautions Precautions: Fall Precaution Comments: extensor tone Restrictions Weight Bearing Restrictions: No    Mobility  Bed Mobility Overal bed mobility: Needs Assistance Bed Mobility: Supine to Sit     Supine to sit: Max assist     General bed mobility comments: pt continues to demonstrate significant extensor tone during mobility  Transfers Overall transfer level: Needs assistance Equipment used: 1 person hand held assist Transfers: Sit to/from Stand;Stand Pivot  Transfers Sit to Stand: Max assist Stand pivot transfers: +2 physical assistance;Max assist       General transfer comment: pt with extensor tone, LLE extending and beginning to slide forward with standing and during pivot, PT blocking RLE. PT encourages forward trunk lean prior to initiation of transfer attempts  Ambulation/Gait                 Stairs             Wheelchair Mobility    Modified Rankin (Stroke Patients Only) Modified Rankin (Stroke Patients Only) Pre-Morbid Rankin Score: Slight disability Modified Rankin: Severe disability     Balance Overall balance assessment: Needs assistance Sitting-balance support: No upper extremity supported;Feet supported Sitting balance-Leahy Scale: Poor Sitting balance - Comments: maxA initially, min-modA with extended time in sitting and with external cues for forward trunk lean Postural control: Posterior lean Standing balance support: Bilateral upper extremity supported Standing balance-Leahy Scale: Zero Standing balance comment: maxA due to posterior lean and extensor tone                            Cognition Arousal/Alertness: Awake/alert Behavior During Therapy: WFL for tasks assessed/performed Overall Cognitive Status: Impaired/Different from baseline Area of Impairment: Orientation;Attention;Memory;Following commands;Safety/judgement;Awareness;Problem solving                 Orientation Level: Disoriented to;Place;Time Current Attention Level: Focused Memory: Decreased recall of precautions;Decreased short-term memory Following Commands: Follows one step commands with increased time Safety/Judgement: Decreased awareness of safety;Decreased awareness of deficits Awareness: Intellectual Problem Solving: Slow processing;Requires verbal cues;Requires tactile cues;Difficulty sequencing        Exercises General Exercises - Lower Extremity Ankle Circles/Pumps: AAROM;Both;10  reps Gluteal  Sets: AROM;Both;5 reps Straight Leg Raises: AAROM;Both;5 reps Other Exercises Other Exercises: rows to pull forward and promote trunk flexion to reduce extensor tone    General Comments General comments (skin integrity, edema, etc.): VSS on RA, pt continues to demonstrate R inattention, is able to turn head toward R side but eyes do not cross midline      Pertinent Vitals/Pain Pain Assessment: No/denies pain    Home Living                      Prior Function            PT Goals (current goals can now be found in the care plan section) Acute Rehab PT Goals Patient Stated Goal: to return to cooking Progress towards PT goals: Progressing toward goals    Frequency    Min 4X/week      PT Plan Current plan remains appropriate    Co-evaluation              AM-PAC PT "6 Clicks" Mobility   Outcome Measure  Help needed turning from your back to your side while in a flat bed without using bedrails?: Total Help needed moving from lying on your back to sitting on the side of a flat bed without using bedrails?: Total Help needed moving to and from a bed to a chair (including a wheelchair)?: Total Help needed standing up from a chair using your arms (e.g., wheelchair or bedside chair)?: Total Help needed to walk in hospital room?: Total Help needed climbing 3-5 steps with a railing? : Total 6 Click Score: 6    End of Session   Activity Tolerance: Patient tolerated treatment well Patient left: in chair;with call bell/phone within reach;with chair alarm set;with family/visitor present;with restraints reapplied Nurse Communication: Mobility status;Need for lift equipment PT Visit Diagnosis: Other abnormalities of gait and mobility (R26.89);Other symptoms and signs involving the nervous system (R29.898)     Time: 2376-2831 PT Time Calculation (min) (ACUTE ONLY): 28 min  Charges:  $Therapeutic Exercise: 8-22 mins $Therapeutic Activity: 8-22 mins                      Zenaida Niece, PT, DPT Acute Rehabilitation Pager: [phone]    Zenaida Niece 08/20/2020, 9:23 AM

## 2020-08-20 NOTE — TOC Initial Note (Signed)
Transition of Care [facility]) - Initial/Assessment Note    Patient Details  Name: Tara [name] MRN: [id number] Date of Birth: 08-18-1936  [facility]) CM/SW Contact:    Joanne Chars, LCSW Phone Number: 08/20/2020, 10:05 AM  Clinical Narrative:    Pt unable to participate in assessment.  CSW attempted to speak with son by phone unsuccessfully, CSW was able to speak with daughter.  Discussed CIR recommendation and alternative possibility of SNF.  Pt currently in independent living apartment at [facility].  No services currently as pt has been doing well and gets transportation or any needed assistance from family or neighbors.  PCP through [facility] in [place].  Pt is vaccinated.             Expected Discharge Plan: IP Rehab Facility Barriers to Discharge: Continued Medical Work up, Other (comment) (CIR evaluation)   Patient Goals and CMS Choice        Expected Discharge Plan and Services Expected Discharge Plan: [place]     Post Acute Care Choice: IP Rehab Living arrangements for the past 2 months: [place] ([place])                                      Prior Living Arrangements/Services Living arrangements for the past 2 months: [place] ([place]) Lives with:: Facility Resident   Do you feel safe going back to the place where you live?: Yes      Need for Family Participation in Patient Care: Yes (Comment) Care giver support system in place?: Yes (comment) (son and daughter)   Criminal Activity/Legal Involvement Pertinent to Current Situation/Hospitalization: No - Comment as needed  Activities of Daily Living      Permission Sought/Granted                  Emotional Assessment Appearance:: Appears stated age Attitude/Demeanor/Rapport: Unable to Assess Affect (typically observed): Unable to Assess Orientation: : Oriented to  Self Alcohol / Substance Use: Not Applicable Psych Involvement: No (comment)  Admission diagnosis:  Hemorrhagic stroke ([place]) [I61.9] ICH (intracerebral hemorrhage) ([place]) [I61.9] Patient Active Problem List   Diagnosis Date Noted  . Pressure injury of skin 08/20/2020  . ICH (intracerebral hemorrhage) ([place]) 08/15/2020  . Anxiety state   . Acute blood loss anemia   . Lumbar radiculopathy 05/28/2019  . Postoperative pain   . Drug induced constipation   . History of TIA (transient ischemic attack)   . Lumbar spinal stenosis 05/23/2019  . HNP (herniated nucleus pulposus), lumbar 05/23/2019  . TIA (transient ischemic attack) 07/02/2018  . Chronic nonintractable headache 07/02/2018  . Onychomycosis due to dermatophyte 05/10/2018  . Other acquired deformity of toe 05/10/2018  . S/P total knee replacement 12/25/2017  . Chronic pain of right knee 10/19/2017  . Ductal carcinoma in situ of right breast 08/17/2015  . Breast cancer of upper-outer quadrant of right female breast ([place]) 08/14/2015  . Right hip pain 10/27/2012  . Low back pain 10/27/2012  . Hypertension   . Osteoarthritis of right knee 11/30/2011  . Baker's cyst of knee 11/30/2011  . Dependent edema 06/19/2011  . Prediabetes 06/19/2011  . Urinary tract infection, recurrent 06/19/2011  . Urinary incontinence 06/19/2011  . Benign positional vertigo 06/19/2011  . Hypothyroidism 06/19/2011  . Tubular adenoma of colon 06/19/2011  . Essential hypertension, benign 04/15/2011  PCP:  [facility]:   [address], [place] [address] Phone: [phone] Fax: [phone]     Social Determinants of Health (SDOH) Interventions    Readmission Risk Interventions No flowsheet data found.

## 2020-08-20 NOTE — Progress Notes (Signed)
STROKE TEAM PROGRESS NOTE   INTERVAL HISTORY Patient is sitting in bedside chair.  She passed swallow eval and is on a diet.  She is still on Cleviprex drip for blood pressure control which is being weaned.  She remains aphasic but is able to speak a few words and follows simple commands.  Vital signs are stable.   OBJECTIVE Vitals:   08/20/20 0420 08/20/20 0430 08/20/20 0500 08/20/20 0600  BP: (!) 169/71 (!) 145/81 (!) 145/65 (!) 143/71  Pulse: 80 72 78 76  Resp: (!) 26 19 18  (!) 23  Temp:      TempSrc:      SpO2: 98% 97% 100% 98%  Weight:       CBC:  Recent Labs  Lab 08/19/20 0631 08/20/20 0431  WBC 12.8* 12.1*  HGB 13.8 13.6  HCT 41.5 41.4  MCV 101.2* 98.8  PLT 158 474   Basic Metabolic Panel:  Recent Labs  Lab 08/17/20 1325 08/17/20 1847 08/19/20 0631 08/20/20 0431  NA 146*   < > 156* 156*  K 3.6   < > 3.4* 3.1*  CL 115*   < > 126* 125*  CO2 22   < > 16* 21*  GLUCOSE 117*   < > 125* 116*  BUN 12   < > 29* 28*  CREATININE 0.66   < > 0.95 0.81  CALCIUM 8.8*   < > 9.2 8.8*  MG 2.0  --   --   --    < > = values in this interval not displayed.   Lipid Panel:     Component Value Date/Time   CHOL 141 08/15/2020 2304   TRIG 49 08/15/2020 2304   HDL 73 08/15/2020 2304   CHOLHDL 1.9 08/15/2020 2304   VLDL 10 08/15/2020 2304   LDLCALC 58 08/15/2020 2304   LDLCALC 126 (H) 10/19/2017 1223   HgbA1c:  Lab Results  Component Value Date   HGBA1C 5.5 08/15/2020   Urine Drug Screen: No results found for: LABOPIA, COCAINSCRNUR, LABBENZ, AMPHETMU, THCU, LABBARB  Alcohol Level No results found for: [facility]  IMAGING  DG Swallowing Func-Speech Pathology  Result Date: 08/19/2020 Objective Swallowing Evaluation: Type of Study: MBS-Modified Barium Swallow Study  Patient Details Name: Tara [name] MRN: [id number] Date of Birth: 01-15-1936 Today's Date: 08/19/2020 Time: SLP Start Time (ACUTE ONLY): 1156 -SLP Stop Time (ACUTE ONLY): 1218 SLP Time Calculation (min) (ACUTE  ONLY): 22 min Past Medical History: Past Medical History: Diagnosis Date . Arthritis   knees . Breast cancer ([place]) 09/30/15  right breast . Dependent edema  . Depression   just lost spouse in Nov. 2018 . Essential hypertension, benign 04/15/2011 . Herniated lumbar disc without myelopathy  . Hx of staphylococcal infection  . Hypertension  . Hypothyroidism  . Obesity  . Pre-diabetes  . Radicular pain  . Recurrent UTI  . Stroke [facility])   ' mini" . Thyroid disease   hypothyroidism . Vertigo  . Wears contact lenses   one Past Surgical History: Past Surgical History: Procedure Laterality Date . ANKLE SURGERY  1994  RIGHT . BACK SURGERY   . BREAST LUMPECTOMY WITH NEEDLE LOCALIZATION Right 09/30/2015  Procedure: RIGHT BREAST LUMPECTOMY WITH NEEDLE LOCALIZATION;  Surgeon: Fanny Skates, MD;  Location: [facility];  Service: General;  Laterality: Right; . BREAST SURGERY   . COLONOSCOPY W/ BIOPSIES AND POLYPECTOMY   . EYE SURGERY    bilateral . HAND SURGERY  1951  RIGHT HAND . JOINT REPLACEMENT  rt shoulder rotator cuff . KNEE ARTHROSCOPY    left knee . LUMBAR LAMINECTOMY/DECOMPRESSION MICRODISCECTOMY Right 01/11/2013  Procedure: LUMBAR LAMINECTOMY/DECOMPRESSION MICRODISCECTOMY 1 LEVEL,Right Lumbar Three-Four;  Surgeon: Elaina Hoops, MD;  Location: [facility];  Service: Neurosurgery;  Laterality: Right; . ROTATOR CUFF REPAIR  2011  RIGHT . TONSILLECTOMY   . TOTAL KNEE ARTHROPLASTY    Left . TOTAL KNEE ARTHROPLASTY Right 12/25/2017  Procedure: TOTAL KNEE ARTHROPLASTY;  Surgeon: Vickey Huger, MD;  Location: [facility];  Service: Orthopedics;  Laterality: Right; HPI: Patient is an 84 y.o. female resident at independent living facility with PMH: right breast CA, HTN, hypothyroidism, pre-diabetes, recurrent UTI, prior CVA, depression, arthritis, who presented to ED via EMS as code stroke. In ED, she exhibited receptive and expressive aphasia but able to communicate enough to state that she had a severe headache. CT revealed  left parieto-occipital ICH.  Subjective: drowsy but arouses well, particular about what she will eat/drink Assessment / Plan / Recommendation CHL IP CLINICAL IMPRESSIONS 08/19/2020 Clinical Impression Pt has an oral more than pharyngeal dysphagia, impacted in part by cognitive-linguistic status. She has reduced lingual control orally, allowing boluses to spill back to the pharynx haphazardly instead of more controlled. Thin liquids pool in her pyriform sinuses until she has cleared the rest of the liquid from her mouth. With larger sips this can fill her pyriform sinuses completely, but she still maintains good airway protection until she takes quite large, consecutive boluses via straw. Aspiration occurred x1 with a reflexive soft cough that brought the aspirate back out of her trachea but not out of her laryngeal vestibule. Pt had limited bolus acceptance with solids during this study and masticated the barium tablet even though she was cued multiple times not to do so. Recommend starting with Dys 3 diet and thin liquids. Suspect that adequate pacing and positioning will be most integral for safe PO intake.  SLP Visit Diagnosis Dysphagia, oral phase (R13.11) Attention and concentration deficit following -- Frontal lobe and executive function deficit following -- Impact on safety and function Mild aspiration risk;Moderate aspiration risk   CHL IP TREATMENT RECOMMENDATION 08/19/2020 Treatment Recommendations Therapy as outlined in treatment plan below   Prognosis 08/19/2020 Prognosis for Safe Diet Advancement Good Barriers to Reach Goals Cognitive deficits;Language deficits Barriers/Prognosis Comment -- CHL IP DIET RECOMMENDATION 08/19/2020 SLP Diet Recommendations Dysphagia 3 (Mech soft) solids;Thin liquid Liquid Administration via Cup;Straw Medication Administration Whole meds with puree Compensations Minimize environmental distractions;Slow rate;Small sips/bites Postural Changes Seated upright at 90 degrees;Remain  semi-upright after after feeds/meals (Comment)   CHL IP OTHER RECOMMENDATIONS 08/19/2020 Recommended Consults -- Oral Care Recommendations Oral care BID Other Recommendations --   CHL IP FOLLOW UP RECOMMENDATIONS 08/19/2020 Follow up Recommendations Inpatient Rehab   CHL IP FREQUENCY AND DURATION 08/19/2020 Speech Therapy Frequency (ACUTE ONLY) min 2x/week Treatment Duration 2 weeks      CHL IP ORAL PHASE 08/19/2020 Oral Phase Impaired Oral - Pudding Teaspoon -- Oral - Pudding Cup -- Oral - Honey Teaspoon -- Oral - Honey Cup -- Oral - Nectar Teaspoon -- Oral - Nectar Cup -- Oral - Nectar Straw -- Oral - Thin Teaspoon -- Oral - Thin Cup Decreased bolus cohesion;Premature spillage Oral - Thin Straw Decreased bolus cohesion;Premature spillage Oral - Puree Delayed oral transit Oral - Mech Soft Delayed oral transit Oral - Regular -- Oral - Multi-Consistency -- Oral - Pill Other (Comment) Oral Phase - Comment --  CHL IP PHARYNGEAL PHASE 08/19/2020 Pharyngeal Phase Impaired Pharyngeal- Pudding  Teaspoon -- Pharyngeal -- Pharyngeal- Pudding Cup -- Pharyngeal -- Pharyngeal- Honey Teaspoon -- Pharyngeal -- Pharyngeal- Honey Cup -- Pharyngeal -- Pharyngeal- Nectar Teaspoon -- Pharyngeal -- Pharyngeal- Nectar Cup -- Pharyngeal -- Pharyngeal- Nectar Straw -- Pharyngeal -- Pharyngeal- Thin Teaspoon -- Pharyngeal -- Pharyngeal- Thin Cup WFL Pharyngeal -- Pharyngeal- Thin Straw Penetration/Aspiration during swallow Pharyngeal Material enters airway, passes BELOW cords and not ejected out despite cough attempt by patient Pharyngeal- Puree WFL Pharyngeal -- Pharyngeal- Mechanical Soft WFL Pharyngeal -- Pharyngeal- Regular -- Pharyngeal -- Pharyngeal- Multi-consistency -- Pharyngeal -- Pharyngeal- Pill -- Pharyngeal -- Pharyngeal Comment --  CHL IP CERVICAL ESOPHAGEAL PHASE 08/19/2020 Cervical Esophageal Phase WFL Pudding Teaspoon -- Pudding Cup -- Honey Teaspoon -- Honey Cup -- Nectar Teaspoon -- Nectar Cup -- Nectar Straw -- Thin Teaspoon  -- Thin Cup -- Thin Straw -- Puree -- Mechanical Soft -- Regular -- Multi-consistency -- Pill -- Cervical Esophageal Comment -- Tara [name]., M.A. [place] Acute Rehabilitation Services Pager [phone] Office [phone] 08/19/2020, 2:51 PM                PHYSICAL EXAM     Frail elderly Caucasian lady not in distress. . Afebrile. Head is nontraumatic. Neck is supple without bruit.    Cardiac exam no murmur or gallop. Lungs are clear to auscultation. Distal pulses are well felt. Neurological Exam : Patient is awake and alert she mumbles a few words and occasional short sentences.   She follows barely few simple midline and one-step commands.  Pupils equal reactive.  Does not blink to threat on right side but does so on the left.  .  Doll's eye movements are sluggish.  Face is symmetric.  Tongue midline.  Motor system exam able to move all 4 extremities of the bed purposefully but not cooperative for detailed exam.  Sensation appears preserved.  Plantars downgoing.  Gait not tested   ASSESSMENT/PLAN Ms. MARISELDA BADALAMENTI is a 84 y.o. female with history of right breast CA, HTN, hypothyroidism, pre-diabetes, recurrent UTI and prior stroke who presents by EMS with aphasia, BP en route 226/115., and a severe headache. A STAT head CT revealed a left parieto-occipital acute ICH. She did not receive IV t-PA due to [place].  Stroke: left parieto-occipital acute ICH  Resultant altered mental status and right hemianopsia  Code Stroke CT Head - Acute intraparenchymal hemorrhage centered at the right parieto-occipital region, estimated volume 13 cc. Minimal surrounding edema without significant regional mass effect.   MRI w/w/o head L occipital parietal ICH, stable. numerous microhemorrhages, presume amyloid. Small acute/subacute B cerebral cortex infarcts  CT head 9/26 increased size L parieto-occipital hemorrhage w/ vasogenic edema and mass effect lateral ventricle. No midline shift.   CT head 9/27 stable  hemorrhage, trace SAH. Possible new mild R scalp hematoma, no fx   CT head 9/29 stable L occipital ICH   2D Echo - EF 65-70%. No source of embolus   Check EEG generalized and L lateral slowing  [employer] Virus 2 - negative  LDL - 58  HgbA1c - 5.5  VTE prophylaxis - SCDs  No antithrombotic prior to admission, now on No antithrombotic given hemorrhage   Therapy recommendations:  SNF  Disposition:  Pending  Cerebral edema  CT w/ increasing edema  Started on 3% protocol  3% drip at 50, will increase to 75h now   Na 142->140->146->148->149->158->156->156   Off 3%  CT head 9/29 stable L occipital ICH  Allow Na to drift down  Plan  is not to restart hypertonic saline as neuro exam appears to have improved  Hypertension  Home BP meds: amlodipine ; Losartan   Remains On Cleviprex  Stable . SBP goal < 160  . Unable to keep BP low on Prn labetolol and hydralaizine alone . Resumed cozaar 25 and norvasc 10 . Wean Cleviprex as able . Long-term BP goal normotensive  Hyperlipidemia  Home Lipid lowering medication: Lipitor 40 mg daily  LDL 58, goal < 70  Current lipid lowering medication: None (statin contraindicated with ICH)  Consider continuation of statin at discharge  Pre Diabetes  Home diabetic meds: none  Current diabetic meds: none  HgbA1c 5.5, goal < 7.0  Dysphagia . Secondary to stroke . NPO . Cortrak and start TF  . Speech on board   Other Stroke Risk Factors   Advanced age  Overweight, Body mass index is 29.37 kg/m., recommend weight loss, diet and exercise as appropriate   Family hx stroke (sister)   Hx stroke/TIA  Other Active Problems  Code status - DNR  Hypokalemia - 3.1->3.1->3.4-3.4 - supplement - now on dysphagia III diet / thin liquids - 3.1 -> supplement further - check in AM  UA - neg     Hyperchloridemia > 130->126 - monitor, allow to drift down -> 125  Leukocytosis WBC - 12.8 - monitor - 12.1  Hospital day #  5 Plan wean off Cleviprex drip can discontinue.  Increase p.o. blood pressure medications.  Mobilize out of bed.  Therapy consults.  Consider transfer to neurology floor bed when off Cleviprex drip.  Discussed with son at the bedside and answered questions. Greater than 50% time during this 35-minute visit was spent on counseling and coordination of care answering questions and discussion with care team. Antony Contras, MD To contact Stroke Continuity provider, please refer to [email or web address] After hours, contact General Neurology

## 2020-08-21 ENCOUNTER — Other Ambulatory Visit: Payer: Self-pay

## 2020-08-21 DIAGNOSIS — I612 Nontraumatic intracerebral hemorrhage in hemisphere, unspecified: Secondary | ICD-10-CM

## 2020-08-21 LAB — BASIC METABOLIC PANEL
Anion gap: 9 (ref 5–15)
BUN: 31 mg/dL — ABNORMAL HIGH (ref 8–23)
CO2: 22 mmol/L (ref 22–32)
Calcium: 8.8 mg/dL — ABNORMAL LOW (ref 8.9–10.3)
Chloride: 121 mmol/L — ABNORMAL HIGH (ref 98–111)
Creatinine, Ser: 0.99 mg/dL (ref 0.44–1.00)
GFR calc Af Amer: >60 mL/min (ref >60)
GFR calc non Af Amer: 53 mL/min — ABNORMAL LOW (ref >60)
Glucose, Bld: 107 mg/dL — ABNORMAL HIGH (ref 70–99)
Potassium: 3.4 mmol/L — ABNORMAL LOW (ref 3.5–5.1)
Sodium: 152 mmol/L — ABNORMAL HIGH (ref 135–145)

## 2020-08-21 LAB — CBC
HCT: 41.2 % (ref 36.0–46.0)
Hemoglobin: 13.5 g/dL (ref 12.0–15.0)
MCH: 32.9 pg (ref 26.0–34.0)
MCHC: 32.8 g/dL (ref 30.0–36.0)
MCV: 100.5 fL — ABNORMAL HIGH (ref 80.0–100.0)
Platelets: 146 10*3/uL — ABNORMAL LOW (ref 150–400)
RBC: 4.1 MIL/uL (ref 3.87–5.11)
RDW: 14.6 % (ref 11.5–15.5)
WBC: 9.9 10*3/uL (ref 4.0–10.5)
nRBC: 0 % (ref 0.0–0.2)

## 2020-08-21 MED ORDER — PANTOPRAZOLE SODIUM 40 MG PO TBEC
40.0000 mg | DELAYED_RELEASE_TABLET | Freq: Every day | ORAL | Status: DC
  Administered 2020-08-21: 40 mg via ORAL
  Filled 2020-08-21: qty 1

## 2020-08-21 MED ORDER — POTASSIUM CHLORIDE 20 MEQ PO PACK: 20.0000 meq | PACK | Freq: Two times a day (BID) | ORAL | Status: DC

## 2020-08-21 MED ORDER — POTASSIUM CHLORIDE 20 MEQ PO PACK
20.0000 meq | PACK | ORAL | Status: AC
  Administered 2020-08-21 (×2): 20 meq via ORAL
  Filled 2020-08-21 (×2): qty 1

## 2020-08-21 NOTE — Progress Notes (Signed)
Inpatient Rehab Admissions Coordinator:   I met with pt and her daughter, [name], at the bedside.  We discussed goals and expectations of CIR stay to include supervision to min assist level goals with about a 2-3 week length of stay.  We discussed that this would mean that our recommendation would be for someone to be present with the pt 24/7 and daughter indicated that they had options to work this out.  Pt is currently a resident of an ILF in [place] and has the ability to transition to ALF if needed.  I would need insurance authorization for this patient, and as we do not have any beds available this weekend, and pt still requiring tight BP management, I will plan to start her insurance auth on Monday for potential admit early next week pending approval and bed availability.   Shann Medal, PT, DPT Admissions Coordinator [phone] 08/21/20  1:01 PM

## 2020-08-21 NOTE — Progress Notes (Addendum)
STROKE TEAM PROGRESS NOTE   INTERVAL HISTORY Patient is sitting up in bed.  She is remains aphasic.  Blood pressure still remaining slightly high.  She has been considered for inpatient rehab.  No family at the bedside.  Vital signs stable.  Neurological exam unchanged CBC is normal.  Serum sodium is drifting down to 152 and potassium 3.4.  OBJECTIVE Vitals:   08/21/20 0500 08/21/20 0530 08/21/20 0600 08/21/20 0700  BP: (!) 176/77 (!) 175/67 (!) 174/66 (!) 159/66  Pulse: 63 68 84 80  Resp: 17 16 (!) 33 (!) 24  Temp:      TempSrc:      SpO2: 98% 97% 96% 96%  Weight:       CBC:  Recent Labs  Lab 08/20/20 0431 08/21/20 0553  WBC 12.1* 9.9  HGB 13.6 13.5  HCT 41.4 41.[name]  MCV 98.8 100.5*  PLT 158 329*   Basic Metabolic Panel:  Recent Labs  Lab 08/17/20 1325 08/17/20 1847 08/20/20 0431 08/21/20 0553  NA 146*   < > 156* 152*  K 3.6   < > 3.1* 3.4*  CL 115*   < > 125* 121*  CO2 22   < > 21* 22  GLUCOSE 117*   < > 116* 107*  BUN 12   < > 28* 31*  CREATININE 0.66   < > 0.81 0.99  CALCIUM 8.8*   < > 8.8* 8.8*  MG [name].0  --   --   --    < > = values in this interval not displayed.   Lipid Panel:     Component Value Date/Time   CHOL 141 08/15/2020 2304   TRIG 49 08/15/2020 2304   HDL 73 08/15/2020 2304   CHOLHDL 1.9 08/15/2020 2304   VLDL 10 08/15/2020 2304   LDLCALC 58 08/15/2020 2304   LDLCALC 126 (H) 10/19/2017 1223   HgbA1c:  Lab Results  Component Value Date   HGBA1C 5.5 08/15/2020   Urine Drug Screen: No results found for: LABOPIA, COCAINSCRNUR, LABBENZ, AMPHETMU, THCU, LABBARB  Alcohol Level No results found for: ETH  IMAGING  No results found.   PHYSICAL EXAM     Frail elderly Caucasian lady not in distress. . Afebrile. Head is nontraumatic. Neck is supple without bruit.    Cardiac exam no murmur or gallop. Lungs are clear to auscultation. Distal pulses are well felt. Neurological Exam :   Patient is awake and alert she mumbles a few words and occasional  short sentences.   She follows barely few simple midline and one-step commands.  Pupils equal reactive.  Does not blink to threat on right side but does so on the left.  .  Doll's eye movements are sluggish.  Face is symmetric.  Tongue midline.  Motor system exam able to move all 4 extremities of the bed purposefully but not cooperative for detailed exam.  Sensation appears preserved.  Plantars downgoing.  Gait not tested   ASSESSMENT/PLAN [name] is a 84 y.o. female with history of right breast CA, HTN, hypothyroidism, pre-diabetes, recurrent UTI and prior stroke who presents by EMS with aphasia, BP en route 226/115., and a severe headache. A STAT head CT revealed a left parieto-occipital acute ICH. She did not receive IV t-PA due to [place].  Stroke: left parieto-occipital acute ICH-etiology hypertensive versus indeterminate normal cortex  Resultant altered mental status and right hemianopsia  Code Stroke CT Head - Acute intraparenchymal hemorrhage centered at the right parieto-occipital region, estimated  volume 13 cc. Minimal surrounding edema without significant regional mass effect.   MRI w/w/o head L occipital parietal ICH, stable. numerous microhemorrhages, presume amyloid. Small acute/subacute B cerebral cortex infarcts  CT head 9/26 increased size L parieto-occipital hemorrhage w/ vasogenic edema and mass effect lateral ventricle. No midline shift.   CT head 9/27 stable hemorrhage, trace SAH. Possible new mild R scalp hematoma, no fx   CT head 9/29 stable L occipital ICH   2D Echo - EF 65-70%. No source of embolus   Check EEG generalized and L lateral slowing  Tara [name] - negative  LDL - 58  HgbA1c - 5.5  VTE prophylaxis - SCDs  No antithrombotic prior to admission, now on No antithrombotic given hemorrhage   Therapy recommendations:  SNF  Disposition:  Pending  Cerebral edema  CT w/ increasing edema  Started on 3% protocol  3% drip at 50, will  increase to 75h now -> off  Na 142->140->146->148->149->158->156->156->152  Off 3%  CT head 9/29 stable L occipital ICH  Allow Na to drift down  Plan is not to restart hypertonic saline as neuro exam appears to have improved  Hypertension  Home BP meds: amlodipine ; Losartan   Remains On Cleviprex ; prn Labetalol and hydralazine  Stable . SBP goal < 160 (SBP 170's this AM) -> Cozaar increased to 50 mg QD to start 10/1 . Unable to keep BP low on Prn labetolol and hydralaizine alone . Resumed cozaar 25 and norvasc 10 -> Cozaar increased to 50 mg QD to start 10/1 . Wean Cleviprex as able . Long-term BP goal normotensive  Hyperlipidemia  Home Lipid lowering medication: Lipitor 40 mg daily  LDL 58, goal < 70  Current lipid lowering medication: None (statin contraindicated with ICH)  Consider continuation of statin at discharge  Pre Diabetes  Home diabetic meds: none  Current diabetic meds: none  HgbA1c 5.5, goal < 7.0  Dysphagia . Secondary to stroke . NPO . Cortrak and start TF  . Speech on board   Other Stroke Risk Factors   Advanced age  Overweight, Body mass index is 29.37 kg/m., recommend weight loss, diet and exercise as appropriate   Family hx stroke (sister)   Hx stroke/TIA  Other Active Problems  Code status - DNR  Hypokalemia - 3.1->3.1->3.4->3.4 - supplement - now on dysphagia III diet / thin liquids - 3.1 ->3.4 -> supplement  UA - neg     Hyperchloridemia > 130->126 - monitor, allow to drift down -> 125->121  Leukocytosis WBC - 12.8->12.1->9.9 (afebrile)  Hospital day # 6 Continue strict blood pressure control with systolic goal below 920.  Increase Cozaar and continue Norvasc and wean off Cleviprex drip.  Mobilize out of bed.  Continue ongoing therapies.  Transfer to neurology floor bed.  Greater than 50% time during this 35-minute visit was spent in counseling and coordination of care and discussion with care team.  Discussed with Dr.  Lala Lund medical hospitalist who will assume patient's care tomorrow after transfer to floor bed Antony Contras, MD To contact Stroke Continuity provider, please refer to [email or web address] After hours, contact General Neurology

## 2020-08-21 NOTE — Progress Notes (Signed)
Physical Therapy Treatment Patient Details Name: ERCEL NORMOYLE MRN: [id number] DOB: 03-16-1936 Today's Date: 08/21/2020    History of Present Illness Gretchen W Insalaco is an 84 y.o. female with a PMHx of right breast CA, HTN, hypothyroidism, pre-diabetes, recurrent UTI and prior stroke who  Demonstrating receptive and expressive aphasia, headache. head CT revealed a left parieto-occipital acute ICH.    PT Comments    Pt tolerates treatment well, continuing to demonstrate some extensor tone when transferring however this is mitigated with use of STEDY as pt is able to initiate forward lean to reduce tone. Pt with improved bed mobility quality and sitting balance without lateral lean noted during session. Pt also demonstrates improved speech quality this session. Pt will benefit from continued aggressive PT POC to reduce falls risk and aide in restoring independence.   Follow Up Recommendations  CIR     Equipment Recommendations  Wheelchair (measurements PT);Wheelchair cushion (measurements PT);Hospital bed (mechanical lift, all if home today)    Recommendations for Other Services       Precautions / Restrictions Precautions Precautions: Fall Precaution Comments: extensor tone Restrictions Weight Bearing Restrictions: No    Mobility  Bed Mobility Overal bed mobility: Needs Assistance Bed Mobility: Supine to Sit     Supine to sit: Min assist;HOB elevated     General bed mobility comments: PT cues for sequencing and use of bed rails  Transfers Overall transfer level: Needs assistance Equipment used: 1 person hand held assist Transfers: Sit to/from Stand;Stand Pivot Transfers Sit to Stand: Max assist;Min assist Stand pivot transfers: Max assist       General transfer comment: maxA sit to stand with use of PT assist and knee block, pt continues to demonstrate LE extensor tone when attempting stands. Extensor tone limited some with use of STEDY and pt pulling forward, minA  for sit to stand with STEDY  Ambulation/Gait                 Stairs             Wheelchair Mobility    Modified Rankin (Stroke Patients Only) Modified Rankin (Stroke Patients Only) Pre-Morbid Rankin Score: Slight disability Modified Rankin: Severe disability     Balance Overall balance assessment: Needs assistance Sitting-balance support: Single extremity supported;Feet supported Sitting balance-Leahy Scale: Poor Sitting balance - Comments: reliant on UE support of bed Postural control: Posterior lean Standing balance support: Bilateral upper extremity supported Standing balance-Leahy Scale: Poor Standing balance comment: minA with BUE support of STEDY, maxA with BUE support of PT due to increased extensor tone                            Cognition Arousal/Alertness: Awake/alert Behavior During Therapy: WFL for tasks assessed/performed Overall Cognitive Status: Impaired/Different from baseline Area of Impairment: Orientation;Attention;Memory;Following commands;Safety/judgement;Awareness;Problem solving                 Orientation Level: Disoriented to;Time (pt knows day of week but not time of day) Current Attention Level: Focused Memory: Decreased recall of precautions;Decreased short-term memory Following Commands: Follows one step commands consistently Safety/Judgement: Decreased awareness of safety;Decreased awareness of deficits Awareness: Intellectual Problem Solving: Slow processing;Requires verbal cues;Requires tactile cues;Difficulty sequencing        Exercises      General Comments General comments (skin integrity, edema, etc.): pt BP 153/61 pre-mobility. Pt denies symptoms during session, RN providing PRN BP medication during session  Pertinent Vitals/Pain Pain Assessment: Faces Faces Pain Scale: Hurts little more Pain Location: head Pain Descriptors / Indicators: Headache Pain Intervention(s): Monitored during  session    Home Living                      Prior Function            PT Goals (current goals can now be found in the care plan section) Acute Rehab PT Goals Patient Stated Goal: to return to cooking Progress towards PT goals: Progressing toward goals    Frequency    Min 4X/week      PT Plan Current plan remains appropriate    Co-evaluation              AM-PAC PT "6 Clicks" Mobility   Outcome Measure  Help needed turning from your back to your side while in a flat bed without using bedrails?: A Lot Help needed moving from lying on your back to sitting on the side of a flat bed without using bedrails?: A Little Help needed moving to and from a bed to a chair (including a wheelchair)?: Total Help needed standing up from a chair using your arms (e.g., wheelchair or bedside chair)?: A Lot Help needed to walk in hospital room?: Total Help needed climbing 3-5 steps with a railing? : Total 6 Click Score: 10    End of Session   Activity Tolerance: Patient tolerated treatment well Patient left: in chair;with call bell/phone within reach;with chair alarm set;with restraints reapplied Nurse Communication: Mobility status;Need for lift equipment (STEDY or lift for transfers) PT Visit Diagnosis: Other abnormalities of gait and mobility (R26.89);Other symptoms and signs involving the nervous system (G62.694)     Time: 8546-2703 PT Time Calculation (min) (ACUTE ONLY): 33 min  Charges:  $Therapeutic Activity: 23-37 mins                     Zenaida Niece, PT, DPT Acute Rehabilitation Pager: [phone]    Zenaida Niece 08/21/2020, 9:41 AM

## 2020-08-21 NOTE — Progress Notes (Signed)
Received from ICU in bed.  Oriented to room and introduced to new noc nurse.

## 2020-08-22 DIAGNOSIS — G936 Cerebral edema: Secondary | ICD-10-CM

## 2020-08-22 DIAGNOSIS — I680 Cerebral amyloid angiopathy: Secondary | ICD-10-CM

## 2020-08-22 DIAGNOSIS — I10 Essential (primary) hypertension: Secondary | ICD-10-CM

## 2020-08-22 LAB — CBC
HCT: 39.9 % (ref 36.0–46.0)
Hemoglobin: 13 g/dL (ref 12.0–15.0)
MCH: 32.2 pg (ref 26.0–34.0)
MCHC: 32.6 g/dL (ref 30.0–36.0)
MCV: 98.8 fL (ref 80.0–100.0)
Platelets: 135 10*3/uL — ABNORMAL LOW (ref 150–400)
RBC: 4.04 MIL/uL (ref 3.87–5.11)
RDW: 14 % (ref 11.5–15.5)
WBC: 8.6 10*3/uL (ref 4.0–10.5)
nRBC: 0 % (ref 0.0–0.2)

## 2020-08-22 LAB — BASIC METABOLIC PANEL
Anion gap: 8 (ref 5–15)
BUN: 16 mg/dL (ref 8–23)
CO2: 23 mmol/L (ref 22–32)
Calcium: 8.5 mg/dL — ABNORMAL LOW (ref 8.9–10.3)
Chloride: 115 mmol/L — ABNORMAL HIGH (ref 98–111)
Creatinine, Ser: 0.69 mg/dL (ref 0.44–1.00)
GFR calc Af Amer: >60 mL/min (ref >60)
GFR calc non Af Amer: >60 mL/min (ref >60)
Glucose, Bld: 104 mg/dL — ABNORMAL HIGH (ref 70–99)
Potassium: 3.4 mmol/L — ABNORMAL LOW (ref 3.5–5.1)
Sodium: 146 mmol/L — ABNORMAL HIGH (ref 135–145)

## 2020-08-22 MED ORDER — POTASSIUM CHLORIDE CRYS ER 10 MEQ PO TBCR
10.0000 meq | EXTENDED_RELEASE_TABLET | Freq: Every day | ORAL | Status: DC
  Administered 2020-08-23: 10 meq via ORAL
  Filled 2020-08-22: qty 1

## 2020-08-22 MED ORDER — POTASSIUM CHLORIDE CRYS ER 20 MEQ PO TBCR
40.0000 meq | EXTENDED_RELEASE_TABLET | ORAL | Status: AC
  Administered 2020-08-22 (×2): 40 meq via ORAL
  Filled 2020-08-22 (×2): qty 2

## 2020-08-22 MED ORDER — HYDROCHLOROTHIAZIDE 12.5 MG PO CAPS
12.5000 mg | ORAL_CAPSULE | Freq: Every day | ORAL | Status: DC
  Administered 2020-08-22 – 2020-08-25 (×4): 12.5 mg via ORAL
  Filled 2020-08-22 (×5): qty 1

## 2020-08-22 MED ORDER — POTASSIUM CHLORIDE CRYS ER 20 MEQ PO TBCR
40.0000 meq | EXTENDED_RELEASE_TABLET | Freq: Once | ORAL | Status: AC
  Administered 2020-08-22: 40 meq via ORAL
  Filled 2020-08-22 (×2): qty 2

## 2020-08-22 NOTE — Progress Notes (Signed)
[facility] PROGRESS NOTE    Tara [name]  GGY:[id number] DOB: May 04, 1936 DOA: 08/15/2020 PCP: [facility]      Brief Narrative:  Tara [name] is a 84 y.o. F with HTN, hypothyroidism, hx TIA, recurrent UTI, and herniated disc last June requiring surgery who presented now with aphasia and BP 226/115 and headache.  In the ER, CT head showed left parieto-occidipital ICH.       Assessment & Plan:  Left parieto-occipital intracerebral hemorrhage Etiology likely hypertensive vs cerebral amyloid angiopathy    Patient admitted to ICU and started on Cleviprex and hypertonic saline.    Serial CT head obtained, reassuring, mental status stable.  Echo unremarkable.  LDL 58.  A1c non-diaebtic.  EEG without epileptiform discharges.    Aspirin contraindicated.  Cleviprex and hypertonic saline now titrated off.  Currently on amlodipine.    Cerebral edema and therapy-induced hypernatremia Exam appears improving, no further hypertonic saline recommended -Allow Na to auto-correct  Stage pressure injury sacrum, not POA  Hypertension Admitted and started on Cleviprex. BP improving.  Cleviprex now titrated off. -Continue amlodipine, losartan  Hypothyroidism -Continue levothyroxine  Hypokalemia -Replete K     Disposition: Status is: Inpatient  Remains inpatient appropriate because:Unsafe d/c plan   Dispo: The patient is from: ALF              Anticipated d/c is to: SNF              Anticipated d/c date is: 2 days              Patient currently is not medically stable to d/c.    Patient was admitted with intracerebral hemorrhage.  Her intensive therapies have been completed, we are now awaiting SNF.          MDM: The below labs and imaging reports were reviewed and summarized above.  Medication management as above.    DVT prophylaxis: SCD's Start: 08/15/20 2310  Code Status: DNR Family Communication:             Subjective: No fever, headache.  She still feels dizzy.  No focal weakness or numbness.  No vomiting.  No seizures.  Objective: Vitals:   08/22/20 0346 08/22/20 0900 08/22/20 1140 08/22/20 1520  BP: (!) 165/69 (!) 153/61 (!) 147/59 139/74  Pulse: 65  (!) 59 70  Resp: 13 17 13 18   Temp: 98.9 F (37.2 C)  98.7 F (37.1 C) 98.5 F (36.9 C)  TempSrc: Oral  Oral Axillary  SpO2: 94%  100% 98%  Weight:        Intake/Output Summary (Last 24 hours) at 08/22/2020 1641 Last data filed at 08/22/2020 0800 Gross per 24 hour  Intake 240 ml  Output 700 ml  Net -460 ml   Filed Weights   08/15/20 2309 08/20/20 0400  Weight: 73.5 kg 75.2 kg    Examination: General appearance: Elderly adult female, alert and in no acute distress.   HEENT: Anicteric, conjunctiva pink, lids and lashes normal. No nasal deformity, discharge, epistaxis.  Lips moist.   Skin: Warm and dry.  No jaundice.  No suspicious rashes or lesions. Cardiac: RRR, nl S1-S2, no murmurs appreciated.  Capillary refill is brisk.  JVP normal.  No LE edema.  Radial pulses 2+ and symmetric. Respiratory: Normal respiratory rate and rhythm.  CTAB without rales or wheezes. Abdomen: Abdomen soft.  No TTP guarding. No ascites, distension, hepatosplenomegaly.   MSK: No deformities or effusions. Neuro: Awake and  alert.  EOMI, moves all extremities with slight generalized weakness. Speech fluent.   Thought content normal, thought process is tangential, word finding difficulties at times Psych: Sensorium intact and responding to questions, attention normal. Affect blunted.  Judgment and insight appear moderately impaired.    Data Reviewed: I have personally reviewed following labs and imaging studies:  CBC: Recent Labs  Lab 08/17/20 2307 08/19/20 0631 08/20/20 0431 08/21/20 0553 08/22/20 0600  WBC 10.3 12.8* 12.1* 9.9 8.6  HGB 14.1 13.8 13.6 13.5 13.0  HCT 41.5 41.5 41.4 41.2 39.9  MCV 97.9 101.2* 98.8 100.5* 98.8   PLT 145* 158 158 146* 086*   Basic Metabolic Panel: Recent Labs  Lab 08/17/20 1325 08/17/20 1847 08/18/20 0947 08/19/20 0631 08/20/20 0431 08/21/20 0553 08/22/20 0600  NA 146*   < > 158* 156* 156* 152* 146*  K 3.6   < > 3.4* 3.4* 3.1* 3.4* 3.4*  CL 115*   < > >130* 126* 125* 121* 115*  CO2 22   < > 17* 16* 21* 22 23  GLUCOSE 117*   < > 131* 125* 116* 107* 104*  BUN 12   < > 22 29* 28* 31* 16  CREATININE 0.66   < > 0.73 0.95 0.81 0.99 0.69  CALCIUM 8.8*   < > 9.0 9.2 8.8* 8.8* 8.5*  MG 2.0  --   --   --   --   --   --    < > = values in this interval not displayed.   GFR: CrCl cannot be calculated (Unknown ideal weight.). Liver Function Tests: Recent Labs  Lab 08/15/20 2304  AST 43*  ALT 32  ALKPHOS 89  BILITOT 1.5*  PROT 7.3  ALBUMIN 3.9   No results for input(s): LIPASE, AMYLASE in the last 168 hours. No results for input(s): AMMONIA in the last 168 hours. Coagulation Profile: Recent Labs  Lab 08/15/20 2304  INR 1.1   Cardiac Enzymes: No results for input(s): CKTOTAL, CKMB, CKMBINDEX, TROPONINI in the last 168 hours. BNP (last 3 results) No results for input(s): PROBNP in the last 8760 hours. HbA1C: No results for input(s): HGBA1C in the last 72 hours. CBG: Recent Labs  Lab 08/15/20 2259  GLUCAP 116*   Lipid Profile: No results for input(s): CHOL, HDL, LDLCALC, TRIG, CHOLHDL, LDLDIRECT in the last 72 hours. Thyroid Function Tests: No results for input(s): TSH, T4TOTAL, FREET4, T3FREE, THYROIDAB in the last 72 hours. Anemia Panel: No results for input(s): VITAMINB12, FOLATE, FERRITIN, TIBC, IRON, RETICCTPCT in the last 72 hours. Urine analysis:    Component Value Date/Time   COLORURINE YELLOW 08/17/2020 1441   APPEARANCEUR CLEAR 08/17/2020 1441   LABSPEC 1.013 08/17/2020 1441   PHURINE 6.0 08/17/2020 1441   GLUCOSEU NEGATIVE 08/17/2020 1441   HGBUR SMALL (A) 08/17/2020 1441   BILIRUBINUR NEGATIVE 08/17/2020 1441   BILIRUBINUR NEG 10/19/2017  1447   KETONESUR 5 (A) 08/17/2020 1441   PROTEINUR NEGATIVE 08/17/2020 1441   UROBILINOGEN 0.2 10/19/2017 1447   UROBILINOGEN 0.2 06/09/2011 1431   NITRITE NEGATIVE 08/17/2020 1441   LEUKOCYTESUR NEGATIVE 08/17/2020 1441   Sepsis Labs: @LABRCNTIP (procalcitonin:4,lacticacidven:4)  ) Recent Results (from the past 240 hour(s))  Respiratory Panel by RT PCR (Flu A&B, Covid) - Nasopharyngeal Swab     Status: None   Collection Time: 08/16/20 12:26 AM   Specimen: Nasopharyngeal Swab  Result Value Ref Range Status   SARS Coronavirus 2 by RT PCR NEGATIVE NEGATIVE Final    Comment: (NOTE) SARS-CoV-2 target  nucleic acids are NOT DETECTED.  The SARS-CoV-2 RNA is generally detectable in upper respiratoy specimens during the acute phase of infection. The lowest concentration of SARS-CoV-2 viral copies this assay can detect is 131 copies/mL. A negative result does not preclude SARS-Cov-2 infection and should not be used as the sole basis for treatment or other patient management decisions. A negative result may occur with  improper specimen collection/handling, submission of specimen other than nasopharyngeal swab, presence of viral mutation(s) within the areas targeted by this assay, and inadequate number of viral copies (<131 copies/mL). A negative result must be combined with clinical observations, patient history, and epidemiological information. The expected result is Negative.  Fact Sheet for Patients:  PinkCheek.be  Fact Sheet for Healthcare Providers:  GravelBags.it  This test is no t yet approved or cleared by the [place] FDA and  has been authorized for detection and/or diagnosis of SARS-CoV-2 by FDA under an Emergency Use Authorization (EUA). This EUA will remain  in effect (meaning this test can be used) for the duration of the COVID-19 declaration under Section 564(b)(1) of the Act, 21 U.S.C. section  360bbb-3(b)(1), unless the authorization is terminated or revoked sooner.     Influenza A by PCR NEGATIVE NEGATIVE Final   Influenza B by PCR NEGATIVE NEGATIVE Final    Comment: (NOTE) The Xpert Xpress SARS-CoV-2/FLU/RSV assay is intended as an aid in  the diagnosis of influenza from Nasopharyngeal swab specimens and  should not be used as a sole basis for treatment. Nasal washings and  aspirates are unacceptable for Xpert Xpress SARS-CoV-2/FLU/RSV  testing.  Fact Sheet for Patients: PinkCheek.be  Fact Sheet for Healthcare Providers: GravelBags.it  This test is not yet approved or cleared by the [place] FDA and  has been authorized for detection and/or diagnosis of SARS-CoV-2 by  FDA under an Emergency Use Authorization (EUA). This EUA will remain  in effect (meaning this test can be used) for the duration of the  Covid-19 declaration under Section 564(b)(1) of the Act, 21  U.S.C. section 360bbb-3(b)(1), unless the authorization is  terminated or revoked. Performed at [facility], [place] [address], [place], [place] [address]   MRSA PCR Screening     Status: None   Collection Time: 08/16/20  1:42 AM   Specimen: Nasopharyngeal  Result Value Ref Range Status   MRSA by PCR NEGATIVE NEGATIVE Final    Comment:        The GeneXpert MRSA Assay (FDA approved for NASAL specimens only), is one component of a comprehensive MRSA colonization surveillance program. It is not intended to diagnose MRSA infection nor to guide or monitor treatment for MRSA infections. Performed at [facility], [place] [address] [place], [place] [address]          Radiology Studies: No results found.      Scheduled Meds: .  stroke: mapping our early stages of recovery book   Does not apply Once  . amLODipine  10 mg Oral Daily  . chlorhexidine  15 mL Mouth Rinse BID  . Chlorhexidine Gluconate Cloth  6 each Topical Daily   . cholecalciferol  2,000 Units Oral Daily  . feeding supplement (ENSURE ENLIVE)  237 mL Oral BID BM  . levothyroxine  75 mcg Oral Daily  . losartan  50 mg Oral Daily  . mouth rinse  15 mL Mouth Rinse q12n4p  . potassium chloride  40 mEq Oral Q4H  . senna-docusate  1 tablet Oral BID  Continuous Infusions:   LOS: 7 days    Time spent: 25 minutes    Edwin Dada, MD [facility] 08/22/2020, 4:41 PM     Please page though [place] or Epic secure chat:  For [employer], Adult nurse

## 2020-08-22 NOTE — Progress Notes (Addendum)
STROKE TEAM PROGRESS NOTE   INTERVAL HISTORY Patient is sitting up in bed.  She is awake alert, still has mild expressive aphasia, but much improved from before.  Moving all extremities.  BP stable.  Sodium 146.  On diet.  OBJECTIVE Vitals:   08/21/20 2335 08/22/20 0346 08/22/20 0900 08/22/20 1140  BP: (!) 170/72 (!) 165/69 (!) 153/61 (!) 147/59  Pulse: 62 65  (!) 59  Resp: 16 13 17 13   Temp: 98.4 F (36.9 C) 98.9 F (37.2 C)  98.7 F (37.1 C)  TempSrc: Oral Oral  Oral  SpO2: 96% 94%  100%  Weight:       CBC:  Recent Labs  Lab 08/21/20 0553 08/22/20 0600  WBC 9.9 8.6  HGB 13.5 13.0  HCT 41.2 39.9  MCV 100.5* 98.8  PLT 146* 073*   Basic Metabolic Panel:  Recent Labs  Lab 08/17/20 1325 08/17/20 1847 08/21/20 0553 08/22/20 0600  NA 146*   < > 152* 146*  K 3.6   < > 3.4* 3.4*  CL 115*   < > 121* 115*  CO2 22   < > 22 23  GLUCOSE 117*   < > 107* 104*  BUN 12   < > 31* 16  CREATININE 0.66   < > 0.99 0.69  CALCIUM 8.8*   < > 8.8* 8.5*  MG 2.0  --   --   --    < > = values in this interval not displayed.   Lipid Panel:     Component Value Date/Time   CHOL 141 08/15/2020 2304   TRIG 49 08/15/2020 2304   HDL 73 08/15/2020 2304   CHOLHDL 1.9 08/15/2020 2304   VLDL 10 08/15/2020 2304   LDLCALC 58 08/15/2020 2304   LDLCALC 126 (H) 10/19/2017 1223   HgbA1c:  Lab Results  Component Value Date   HGBA1C 5.5 08/15/2020   Urine Drug Screen: No results found for: LABOPIA, COCAINSCRNUR, LABBENZ, AMPHETMU, THCU, LABBARB  Alcohol Level No results found for: ETH  IMAGING  No results found.   PHYSICAL EXAM   Temp:  [98 F (36.7 C)-99.1 F (37.3 C)] 98.5 F (36.9 C) (10/02 1520) Pulse Rate:  [59-73] 70 (10/02 1520) Resp:  [13-21] 18 (10/02 1520) BP: (124-170)/(59-81) 139/74 (10/02 1520) SpO2:  [94 %-100 %] 98 % (10/02 1520)  General - Well nourished, well developed, in no apparent distress.  Ophthalmologic - fundi not visualized due to  noncooperation.  Cardiovascular - Regular rhythm and rate.  Neuro - awake alert, interactive.  Language with intermittent hesitation and paraphasic errors but able to speak in short sentences.  Mild psychomotor slowing, however, orientated to time place and age.  Follow simple commands, able to name 2/3, able to repeat simple sentences but not complex sentence.  Seems to have right hemianopia, no gaze preference, mild right nasolabial fold flattening, tongue midline.  Moving all extremities symmetrically.  Sensation subjectively symmetrical.  Bilateral finger-to-nose grossly intact.  Gait not tested.   ASSESSMENT/PLAN Tara [name] is a 84 y.o. female with history of right breast CA, HTN, hypothyroidism, pre-diabetes, recurrent UTI and prior stroke who presents by EMS with aphasia, BP en route 226/115., and a severe headache. A STAT head CT revealed a left parieto-occipital acute ICH. She did not receive IV t-PA due to [place].  ICH: left parieto-occipital acute ICH - etiology hypertensive vs possible CAA  CT Head - Acute intraparenchymal hemorrhage centered at the right parieto-occipital region, estimated volume 13  cc. Minimal surrounding edema without significant regional mass effect.   MRI w/w/o head L occipital parietal ICH, stable. numerous microhemorrhages, presume amyloid. Small acute/subacute B cerebral cortex infarcts  CT head 9/26 increased size L parieto-occipital hemorrhage w/ vasogenic edema and mass effect lateral ventricle. No midline shift.   CT head 9/27 stable hemorrhage, trace SAH. Possible new mild R scalp hematoma, no fx   CT head 9/29 stable L occipital ICH   2D Echo - EF 65-70%. No source of embolus   Check EEG - no seizure, generalized and L lateral slowing  [employer] Virus 2 - negative  LDL - 58  HgbA1c - 5.5  VTE prophylaxis - SCDs  No antithrombotic prior to admission, now on No antithrombotic given hemorrhage and possible CAA.   Therapy  recommendations:  CIR  Disposition:  Pending  Cerebral edema  CT w/ increasing edema  Started on 3% protocol -> now off  Na 142->140->146->148->149->158->156->156->152->146  CT head 9/29 stable L occipital ICH  Allow Na to drift down  Possible CAA  MRI w/w/o head with numerous microhemorrhages, presume amyloid.  Avoid antiplatelet or anticoagulation  BP < 140  Follow up with Dr. Krista Blue at [facility]  History of stroke/TIA  05/2018 admitted for left-sided weakness and numbness. Put on aspirin 325.  Consider TIA.  Follow with Dr[facility] Krista Blue at [facility]  Hypertension  Home BP meds: amlodipine ; Losartan   Remains On Cleviprex ; prn Labetalol and hydralazine  Stable  Off cleviprex . SBP goal < 160 . On cozaar 50 and norvasc 10  . Long-term BP goal normotensive  Dysphagia, resolved . Secondary to stroke . NPO -> now on diet . Speech on board   Other Stroke Risk Factors   Advanced age  Family hx stroke (sister)   Other Active Problems  Code status - DNR  Hypokalemia - 3.1->3.1->3.4->3.4 - supplement  Leukocytosis, resolved - WBC - 12.8->12.1->9.9->8.6  Hospital day # 7  Neurology will sign off. Please call with questions. Pt will follow up with Dr. Krista Blue at [facility] in about 4 weeks. Thanks for the consult.  Rosalin Hawking, MD PhD Stroke Neurology 08/22/2020 4:13 PM  To contact Stroke Continuity provider, please refer to [email or web address] After hours, contact General Neurology

## 2020-08-22 NOTE — Progress Notes (Signed)
PT Cancellation Note  Patient Details Name: Tara [name] MRN: [id number] DOB: [birth year]   Cancelled Treatment:    Reason Eval/Treat Not Completed: Other (comment).  PT attempted to awaken pt with verbal and tactile stimulation, pt is unable to keep her eyes open.  Will reattempt at another time.   Ramond Dial 08/22/2020, 3:59 PM   Mee Hives, PT MS Acute Rehab Dept. Number: [place] [facility] [place]

## 2020-08-23 LAB — CBC
HCT: 40.5 % (ref 36.0–46.0)
Hemoglobin: 13.8 g/dL (ref 12.0–15.0)
MCH: 33.5 pg (ref 26.0–34.0)
MCHC: 34.1 g/dL (ref 30.0–36.0)
MCV: 98.3 fL (ref 80.0–100.0)
Platelets: 171 10*3/uL (ref 150–400)
RBC: 4.12 MIL/uL (ref 3.87–5.11)
RDW: 13.8 % (ref 11.5–15.5)
WBC: 10 10*3/uL (ref 4.0–10.5)
nRBC: 0 % (ref 0.0–0.2)

## 2020-08-23 LAB — BASIC METABOLIC PANEL
Anion gap: 8 (ref 5–15)
BUN: 8 mg/dL (ref 8–23)
CO2: 25 mmol/L (ref 22–32)
Calcium: 8.5 mg/dL — ABNORMAL LOW (ref 8.9–10.3)
Chloride: 109 mmol/L (ref 98–111)
Creatinine, Ser: 0.61 mg/dL (ref 0.44–1.00)
GFR calc Af Amer: >60 mL/min (ref >60)
GFR calc non Af Amer: >60 mL/min (ref >60)
Glucose, Bld: 105 mg/dL — ABNORMAL HIGH (ref 70–99)
Potassium: 3.1 mmol/L — ABNORMAL LOW (ref 3.5–5.1)
Sodium: 142 mmol/L (ref 135–145)

## 2020-08-23 MED ORDER — POTASSIUM CHLORIDE CRYS ER 20 MEQ PO TBCR
40.0000 meq | EXTENDED_RELEASE_TABLET | Freq: Two times a day (BID) | ORAL | Status: AC
  Administered 2020-08-23 (×2): 40 meq via ORAL
  Filled 2020-08-23 (×2): qty 2

## 2020-08-23 MED ORDER — ONDANSETRON HCL 4 MG PO TABS
4.0000 mg | ORAL_TABLET | Freq: Three times a day (TID) | ORAL | Status: DC | PRN
  Administered 2020-08-25 – 2020-08-27 (×2): 4 mg via ORAL
  Filled 2020-08-23 (×2): qty 1

## 2020-08-23 NOTE — Progress Notes (Signed)
[name] PROGRESS NOTE    Tara [name]  IPJ:[id number] DOB: 08-08-1936 DOA: 08/15/2020 PCP: [facility]      Brief Narrative:  Mrs. Tara [name] is a 84 y.o. F with HTN, hypothyroidism, hx TIA, recurrent UTI, and herniated disc last June requiring surgery who presented now with aphasia and BP 226/115 and headache.  In the ER, CT head showed left parieto-occidipital ICH.       Assessment & Plan:  Left parieto-occipital intracerebral hemorrhage Etiology likely hypertensive vs cerebral amyloid angiopathy    Patient admitted to ICU and started on Cleviprex and hypertonic saline.    Serial CT head obtained, reassuring, mental status stable.  Echo unremarkable.  LDL 58.  A1c non-diaebtic.  EEG without epileptiform discharges.    Aspirin contraindicated.  Cleviprex and hypertonic saline now titrated off.  Currently on amlodipine. -Continue amlodipine, losartan, HCTZ   Cerebral edema and therapy-induced hypernatremia Exam appears improving, no further hypertonic saline recommended  Hypernatremia resolved -Allow Na to auto-correct  Stage pressure injury sacrum, not POA  Hypertension Admitted and started on Cleviprex.   Blood pressure improving -Continue amlodipine, losartan, HCTZ  Hypothyroidism -Continue levothyroxine  Hypokalemia Potassium still low -Replete K again     Disposition: Status is: Inpatient  Remains inpatient appropriate because:Unsafe d/c plan   Dispo: The patient is from: ALF              Anticipated d/c is to: SNF              Anticipated d/c date is: 2 days              Patient currently is not medically stable to d/c.    Patient was admitted with intracerebral hemorrhage.   Medically ready for discharge to SNF, the patient is weak and unlikely to be able to tolerate 4 hours of intensive therapy per day, she is however, likely to rehabilitate well.           MDM: The below labs and  imaging reports were reviewed and summarized above.  Medication management as above.    DVT prophylaxis: SCD's Start: 08/15/20 2310  Code Status: DNR Family Communication:            Subjective: No headache, seizures, focal weakness or numbness, vomiting, fever.  Objective: Vitals:   08/22/20 2156 08/22/20 2318 08/23/20 0318 08/23/20 0718  BP: (!) 176/83 (!) 156/57 (!) 150/71 138/62  Pulse:  66 66 61  Resp:  14 13 12   Temp:  98.9 F (37.2 C) 98.6 F (37 C) 98.6 F (37 C)  TempSrc:  Axillary Axillary Oral  SpO2:  100% 100% 98%  Weight:   57.6 kg     Intake/Output Summary (Last 24 hours) at 08/23/2020 1449 Last data filed at 08/23/2020 1044 Gross per 24 hour  Intake 100 ml  Output 2100 ml  Net -2000 ml   Filed Weights   08/15/20 2309 08/20/20 0400 08/23/20 0318  Weight: 73.5 kg 75.2 kg 57.6 kg    Examination: General appearance: Elderly female, lying in bed, no acute distress, arouses sluggishly to touch, and falls back asleep.   HEENT: Anicteric, conjunctival pink, lids and lashes normal.  No nasal forming, discharge, or epistaxis. Skin:  Cardiac: RRR, no murmurs appreciated, JVP normal, no lower extremity edema. Respiratory: Normal respiratory rate and rhythm, lungs clear without rales wheezes  Abdomen: Abdomen soft without tenderness palpation or guarding, no ascites or distention MSK: No deformities or effusions. Neuro:  Awake and alert, moves all extremities with generalized weakness, speech fluent.  Thought content and processes somewhat distracted and confused Psych: Sensorium intact responding to questions, attention normal, affect blunted, judgment insight appears somewhat impaired.    Data Reviewed: I have personally reviewed following labs and imaging studies:  CBC: Recent Labs  Lab 08/19/20 0631 08/20/20 0431 08/21/20 0553 08/22/20 0600 08/23/20 0040  WBC 12.8* 12.1* 9.9 8.6 10.0  HGB 13.8 13.6 13.5 13.0 13.8  HCT 41.5 41.4 41.2 39.9 40.5   MCV 101.2* 98.8 100.5* 98.8 98.3  PLT 158 158 146* 135* 989   Basic Metabolic Panel: Recent Labs  Lab 08/17/20 1325 08/17/20 1847 08/19/20 0631 08/20/20 0431 08/21/20 0553 08/22/20 0600 08/23/20 0040  NA 146*   < > 156* 156* 152* 146* 142  K 3.6   < > 3.4* 3.1* 3.4* 3.4* 3.1*  CL 115*   < > 126* 125* 121* 115* 109  CO2 22   < > 16* 21* 22 23 25   GLUCOSE 117*   < > 125* 116* 107* 104* 105*  BUN 12   < > 29* 28* 31* 16 8  CREATININE 0.66   < > 0.95 0.81 0.99 0.69 0.61  CALCIUM 8.8*   < > 9.2 8.8* 8.8* 8.5* 8.5*  MG 2.0  --   --   --   --   --   --    < > = values in this interval not displayed.   GFR: CrCl cannot be calculated (Unknown ideal weight.). Liver Function Tests: No results for input(s): AST, ALT, ALKPHOS, BILITOT, PROT, ALBUMIN in the last 168 hours. No results for input(s): LIPASE, AMYLASE in the last 168 hours. No results for input(s): AMMONIA in the last 168 hours. Coagulation Profile: No results for input(s): INR, PROTIME in the last 168 hours. Cardiac Enzymes: No results for input(s): CKTOTAL, CKMB, CKMBINDEX, TROPONINI in the last 168 hours. BNP (last 3 results) No results for input(s): PROBNP in the last 8760 hours. HbA1C: No results for input(s): HGBA1C in the last 72 hours. CBG: No results for input(s): GLUCAP in the last 168 hours. Lipid Profile: No results for input(s): CHOL, HDL, LDLCALC, TRIG, CHOLHDL, LDLDIRECT in the last 72 hours. Thyroid Function Tests: No results for input(s): TSH, T4TOTAL, FREET4, T3FREE, THYROIDAB in the last 72 hours. Anemia Panel: No results for input(s): VITAMINB12, FOLATE, FERRITIN, TIBC, IRON, RETICCTPCT in the last 72 hours. Urine analysis:    Component Value Date/Time   COLORURINE YELLOW 08/17/2020 1441   APPEARANCEUR CLEAR 08/17/2020 1441   LABSPEC 1.013 08/17/2020 1441   PHURINE 6.0 08/17/2020 1441   GLUCOSEU NEGATIVE 08/17/2020 1441   HGBUR SMALL (A) 08/17/2020 1441   BILIRUBINUR NEGATIVE 08/17/2020 1441    BILIRUBINUR NEG 10/19/2017 1447   KETONESUR 5 (A) 08/17/2020 1441   PROTEINUR NEGATIVE 08/17/2020 1441   UROBILINOGEN 0.2 10/19/2017 1447   UROBILINOGEN 0.2 06/09/2011 1431   NITRITE NEGATIVE 08/17/2020 1441   LEUKOCYTESUR NEGATIVE 08/17/2020 1441   Sepsis Labs: @LABRCNTIP (procalcitonin:4,lacticacidven:4)  ) Recent Results (from the past 240 hour(s))  Respiratory Panel by RT PCR (Flu A&B, Covid) - Nasopharyngeal Swab     Status: None   Collection Time: 08/16/20 12:26 AM   Specimen: Nasopharyngeal Swab  Result Value Ref Range Status   SARS Coronavirus 2 by RT PCR NEGATIVE NEGATIVE Final    Comment: (NOTE) SARS-CoV-2 target nucleic acids are NOT DETECTED.  The SARS-CoV-2 RNA is generally detectable in upper respiratoy specimens during the acute phase of  infection. The lowest concentration of SARS-CoV-2 viral copies this assay can detect is 131 copies/mL. A negative result does not preclude SARS-Cov-2 infection and should not be used as the sole basis for treatment or other patient management decisions. A negative result may occur with  improper specimen collection/handling, submission of specimen other than nasopharyngeal swab, presence of viral mutation(s) within the areas targeted by this assay, and inadequate number of viral copies (<131 copies/mL). A negative result must be combined with clinical observations, patient history, and epidemiological information. The expected result is Negative.  Fact Sheet for Patients:  PinkCheek.be  Fact Sheet for Healthcare Providers:  GravelBags.it  This test is no t yet approved or cleared by the [place] FDA and  has been authorized for detection and/or diagnosis of SARS-CoV-2 by FDA under an Emergency Use Authorization (EUA). This EUA will remain  in effect (meaning this test can be used) for the duration of the COVID-19 declaration under Section 564(b)(1) of the Act,  21 U.S.C. section 360bbb-3(b)(1), unless the authorization is terminated or revoked sooner.     Influenza A by PCR NEGATIVE NEGATIVE Final   Influenza B by PCR NEGATIVE NEGATIVE Final    Comment: (NOTE) The Xpert Xpress SARS-CoV-2/FLU/RSV assay is intended as an aid in  the diagnosis of influenza from Nasopharyngeal swab specimens and  should not be used as a sole basis for treatment. Nasal washings and  aspirates are unacceptable for Xpert Xpress SARS-CoV-2/FLU/RSV  testing.  Fact Sheet for Patients: PinkCheek.be  Fact Sheet for Healthcare Providers: GravelBags.it  This test is not yet approved or cleared by the [place] FDA and  has been authorized for detection and/or diagnosis of SARS-CoV-2 by  FDA under an Emergency Use Authorization (EUA). This EUA will remain  in effect (meaning this test can be used) for the duration of the  Covid-19 declaration under Section 564(b)(1) of the Act, 21  U.S.C. section 360bbb-3(b)(1), unless the authorization is  terminated or revoked. Performed at [facility], [place] [address] [place], [place] [address]   MRSA PCR Screening     Status: None   Collection Time: 08/16/20  1:42 AM   Specimen: Nasopharyngeal  Result Value Ref Range Status   MRSA by PCR NEGATIVE NEGATIVE Final    Comment:        The GeneXpert MRSA Assay (FDA approved for NASAL specimens only), is one component of a comprehensive MRSA colonization surveillance program. It is not intended to diagnose MRSA infection nor to guide or monitor treatment for MRSA infections. Performed at [facility], [place] [address], [place], [place] [address]          Radiology Studies: No results found.      Scheduled Meds: .  stroke: mapping our early stages of recovery book   Does not apply Once  . amLODipine  10 mg Oral Daily  . chlorhexidine  15 mL Mouth Rinse BID  . Chlorhexidine Gluconate Cloth  6  each Topical Daily  . cholecalciferol  2,000 Units Oral Daily  . feeding supplement (ENSURE ENLIVE)  237 mL Oral BID BM  . hydrochlorothiazide  12.5 mg Oral Daily  . levothyroxine  75 mcg Oral Daily  . losartan  50 mg Oral Daily  . mouth rinse  15 mL Mouth Rinse q12n4p  . potassium chloride  10 mEq Oral Daily  . senna-docusate  1 tablet Oral BID   Continuous Infusions:   LOS: 8 days    Time spent:  25 minutes    Edwin Dada, MD [facility] 08/23/2020, 2:49 PM     Please page though [place] or Epic secure chat:  For [employer], Adult nurse

## 2020-08-24 LAB — BASIC METABOLIC PANEL
Anion gap: 8 (ref 5–15)
BUN: 12 mg/dL (ref 8–23)
CO2: 28 mmol/L (ref 22–32)
Calcium: 8.7 mg/dL — ABNORMAL LOW (ref 8.9–10.3)
Chloride: 105 mmol/L (ref 98–111)
Creatinine, Ser: 0.92 mg/dL (ref 0.44–1.00)
GFR calc Af Amer: >60 mL/min (ref >60)
GFR calc non Af Amer: 58 mL/min — ABNORMAL LOW (ref >60)
Glucose, Bld: 111 mg/dL — ABNORMAL HIGH (ref 70–99)
Potassium: 3.7 mmol/L (ref 3.5–5.1)
Sodium: 141 mmol/L (ref 135–145)

## 2020-08-24 LAB — CBC
HCT: 38.7 % (ref 36.0–46.0)
Hemoglobin: 12.8 g/dL (ref 12.0–15.0)
MCH: 32.3 pg (ref 26.0–34.0)
MCHC: 33.1 g/dL (ref 30.0–36.0)
MCV: 97.7 fL (ref 80.0–100.0)
Platelets: 186 10*3/uL (ref 150–400)
RBC: 3.96 MIL/uL (ref 3.87–5.11)
RDW: 13.4 % (ref 11.5–15.5)
WBC: 9.9 10*3/uL (ref 4.0–10.5)
nRBC: 0 % (ref 0.0–0.2)

## 2020-08-24 MED ORDER — INFLUENZA VAC A&B SA ADJ QUAD 0.5 ML IM PRSY
0.5000 mL | PREFILLED_SYRINGE | INTRAMUSCULAR | Status: AC
  Administered 2020-08-25: 0.5 mL via INTRAMUSCULAR
  Filled 2020-08-24 (×3): qty 0.5
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
## 2020-08-24 NOTE — Progress Notes (Signed)
Inpatient Rehab Admissions Coordinator:   Met with patient and her daughter at the bedside.  I've sent a message to acute therapy, and am waiting for updated OT notes to open for insurance prior-auth.  Will continue to follow.   Shann Medal, PT, DPT Admissions Coordinator [phone] 08/24/20  12:24 PM

## 2020-08-24 NOTE — Progress Notes (Signed)
Physical Therapy Treatment Patient Details Name: Tara [name] MRN: [id number] DOB: [birth year] Today's Date: 08/24/2020    History of Present Illness Tara [name] is an 84 y.o. female with a PMHx of right breast CA, HTN, hypothyroidism, pre-diabetes, recurrent UTI and prior stroke who  Demonstrating receptive and expressive aphasia, headache. head CT revealed a left parieto-occipital acute ICH.    Tara [name] Comments    Today's skilled session continued to focus on mobility progression with less overall physical assistance needed. The Tara [name] is progressing toward goals and should benefit from continued Tara [name]. Acute Tara [name] to continue during Tara [name]'s hospital stay.   Follow Up Recommendations  CIR     Equipment Recommendations  Wheelchair (measurements Tara [name]);Wheelchair cushion (measurements Tara [name]);Hospital bed    Recommendations for Other Services Rehab consult     Precautions / Restrictions Precautions Precautions: Fall Precaution Comments: extensor tone Restrictions Weight Bearing Restrictions: No    Mobility  Bed Mobility               General bed mobility comments: OOB in chair before and after session  Transfers Overall transfer level: Needs assistance Equipment used: 1 person hand held assist Transfers: Sit to/from Stand Sit to Stand: Mod assist         General transfer comment: with PTA seated in front of Tara [name]: mod/max asssist to stand with bil LE's blocked to hold feet in place so not to kick out due to extensor tone. in standign mod/max assist working on hip and trunk extension with decreased posterior lean. Tara [name] with reportsd of dizziness therefore returned Tara [name] to sitting with mod/max assist. Tara [name] reported decreased dizziness after sitting down. Tara [name] was able to scoot self forward and then back in the chair.   Modified Rankin (Stroke Patients Only) Modified Rankin (Stroke Patients Only) Pre-Morbid Rankin Score: Slight disability Modified Rankin: Severe disability     Balance  Overall balance assessment: Needs assistance Sitting-balance support: No upper extremity supported;Feet supported Sitting balance-Leahy Scale: Fair Sitting balance - Comments: Tara [name] able to maintain unsupported sitting at edge of chair with no UE support with no balance loss. Does demo a left gaze/trunk rotation preference at times. Postural control: Posterior lean Standing balance support: Bilateral upper extremity supported;During functional activity Standing balance-Leahy Scale: Poor Standing balance comment: in standing Tara [name] has a significant posterior lean needing assist/faciliation to correct                            Cognition Arousal/Alertness: Awake/alert Behavior During Therapy: WFL for tasks assessed/performed Overall Cognitive Status: Impaired/Different from baseline Area of Impairment: Orientation;Memory;Safety/judgement;Awareness;Problem solving                 Orientation Level: Disoriented to;Time Current Attention Level: Focused Memory: Decreased recall of precautions;Decreased short-term memory Following Commands: Follows one step commands consistently Safety/Judgement: Decreased awareness of safety;Decreased awareness of deficits Awareness: Intellectual Problem Solving: Slow processing;Requires verbal cues;Requires tactile cues;Difficulty sequencing General Comments: follows simple commands consistently      Exercises General Exercises - Lower Extremity Ankle Circles/Pumps: AAROM;Strengthening;Both;10 reps;Seated;Limitations Ankle Circles/Pumps Limitations: with foot rest up Long Arc Quad: AAROM;Strengthening;Both;10 reps;Seated;Limitations Long [employer] Limitations: assist/cues for controlled/slow movements Heel Slides: AAROM;Strengthening;Both;10 reps;Seated;Limitations Heel Slides Limitations: with foot rest up     Pertinent Vitals/Pain Pain Assessment: 0-10 Pain Score: 0-No pain     Tara [name] Goals (current goals can now be found in the care  plan section) Acute Rehab Tara [name] Goals Patient Stated Goal: to return to  cooking Tara [name] Goal Formulation: With family Time For Goal Achievement: 08/30/20 Potential to Achieve Goals: Poor Progress towards Tara [name] goals: Progressing toward goals    Frequency    Min 4X/week      Tara [name] Plan Current plan remains appropriate    AM-PAC Tara [name] "6 Clicks" Mobility   Outcome Measure  Help needed turning from your back to your side while in a flat bed without using bedrails?: A Lot Help needed moving from lying on your back to sitting on the side of a flat bed without using bedrails?: A Little Help needed moving to and from a bed to a chair (including a wheelchair)?: Total Help needed standing up from a chair using your arms (e.g., wheelchair or bedside chair)?: A Lot Help needed to walk in hospital room?: Total Help needed climbing 3-5 steps with a railing? : Total 6 Click Score: 10    End of Session Equipment Utilized During Treatment: Gait belt Activity Tolerance: Patient tolerated treatment well Patient left: in chair;with call bell/phone within reach;with chair alarm set Nurse Communication: Mobility status Tara [name] Visit Diagnosis: Other abnormalities of gait and mobility (R26.89);Other symptoms and signs involving the nervous system (R29.898)     Time: 0950-1008 Tara [name] Time Calculation (min) (ACUTE ONLY): 18 min  Charges:  $Therapeutic Activity: 8-22 mins                    Willow Ora, PTA, [facility] Acute [employer] [phone] 08/24/20, 10:36 AM  Willow Ora 08/24/2020, 10:35 AM

## 2020-08-24 NOTE — Progress Notes (Signed)
  Speech Language Pathology Treatment: Dysphagia;Cognitive-Linquistic  Patient Details Name: Tara [name] MRN: [id number] DOB: 03-24-1936 Today's Date: 08/24/2020 Time: 3532-9924 SLP Time Calculation (min) (ACUTE ONLY): 21 min  Assessment / Plan / Recommendation Clinical Impression  Pt's cognitive-linguistic function has improved since the end of last week. She is much more attentive to her lunch tray, and with more fluent verbal output. Mild word-finding errors are noted in spontaneous communication, with Min cues provided by SLP to facilitate. Pt has occasional throat clearing after drinking thin liquids, at times precipitated by wet vocal quality, but no overt coughing. She has improved oral preparation with food on her tray that has been cut into bite-sized pieces as well as with regular solids/finger foods. SLP offered a diet advancement, but pt and daughter did not want to move forward just yet. I think that she will be ready for this as soon as they feel comfortable. Continue to recommend CIR.    HPI HPI: Patient is an 84 y.o. female resident at independent living facility with PMH: right breast CA, HTN, hypothyroidism, pre-diabetes, recurrent UTI, prior CVA, depression, arthritis, who presented to ED via EMS as code stroke. In ED, she exhibited receptive and expressive aphasia but able to communicate enough to state that she had a severe headache. CT revealed left parieto-occipital ICH.      SLP Plan  Continue with current plan of care       Recommendations  Diet recommendations: Dysphagia 3 (mechanical soft);Thin liquid Liquids provided via: Cup;Straw Medication Administration: Whole meds with puree Supervision: Staff to assist with self feeding;Full supervision/cueing for compensatory strategies Compensations: Minimize environmental distractions;Slow rate;Small sips/bites Postural Changes and/or Swallow Maneuvers: Seated upright 90 degrees                Oral Care  Recommendations: Oral care BID Follow up Recommendations: Inpatient Rehab SLP Visit Diagnosis: Dysphagia, oral phase (R13.11);Aphasia (R47.01) Plan: Continue with current plan of care       GO                [name], M.A. [place] Acute Rehabilitation Services Pager [phone] Office [phone]  08/24/2020, 2:12 PM

## 2020-08-24 NOTE — Progress Notes (Signed)
[facility] PROGRESS NOTE    Tara [name]  KDT:[id number] DOB: [birth year] DOA: 08/15/2020 PCP: [facility]      Brief Narrative:  Tara [name] is a 84 y.o. F with HTN, hypothyroidism, hx TIA, recurrent UTI, and herniated disc last June requiring surgery who presented now with aphasia and BP 226/115 and headache.  In the ER, CT head showed left parieto-occidipital ICH.       Assessment & Plan:  Left parieto-occipital ICH Hypertension See summary from 10/3 BP well controlled -Continue amlodipine, losartan, HCTZ   Hypothyroidism -Continue levothyroxine  Hypokalemia Resolved  Stage pressure injury sacrum, not POA     Disposition: Status is: Inpatient  Remains inpatient appropriate because:Unsafe d/c plan   Dispo: The patient is from: ALF              Anticipated d/c is to: SNF              Anticipated d/c date is: 2 days              Patient currently is not medically stable to d/c.    Patient was admitted with intracerebral hemorrhage.  Medically ready for discharge to SNF, the patient is weak, tolerated only 18 minutes of therapy today, which was her first day out of bed, and unlikely to be able to tolerate several hours of intensive therapy per day, but would likely benefit greatly from a more gradual and less intensive therapy regimen.           MDM: The below labs and imaging reports were reviewed and summarized above.  Medication management as above.    DVT prophylaxis: SCD's Start: 08/15/20 2310  Code Status: DNR Family Communication:            Subjective: No new fever, abdominal symptoms, respiratory symptoms, headache.  No seizures, focal weakness, numbness or LOC.         Objective: Vitals:   08/24/20 0413 08/24/20 0800 08/24/20 1146 08/24/20 1557  BP: 130/62 140/64 130/60 133/62  Pulse: 67 (!) 59 64 73  Resp: 18 16 16 16   Temp: 98.6 F (37 C) (!) 97.5 F (36.4 C) 98.2 F (36.8  C) 98.6 F (37 C)  TempSrc:  Axillary Oral Oral  SpO2: 96% 96% 98% 96%  Weight:        Intake/Output Summary (Last 24 hours) at 08/24/2020 1847 Last data filed at 08/24/2020 0500 Gross per 24 hour  Intake 130 ml  Output --  Net 130 ml   Filed Weights   08/15/20 2309 08/20/20 0400 08/23/20 0318  Weight: 73.5 kg 75.2 kg 57.6 kg    Examination: General appearance: Elderly female, sleeping, arouses easily, interactive, no obvious distress.   HEENT: Anicteric, conjunctival pink, lids and lashes normal.  No nasal deformity, discharge, or epistaxis. Skin: Appropriate for age. Cardiac: RRR, no murmurs appreciated, no lower extremity edema.  Shins are tender, at baseline. Respiratory: Normal respiratory rate and rhythm, lungs clear without rales or wheezes. Abdomen: Abdomen soft, no tenderness palpation, no guarding, no ascites or distention. MSK:  Neuro: Sleeping but easily arousable, moves all extremities with generalized weakness, speech fluent but sometimes word finding difficulties. Psych: Affect blunted, attention normal, judgment insight appear somewhat impaired    Data Reviewed: I have personally reviewed following labs and imaging studies:  CBC: Recent Labs  Lab 08/20/20 0431 08/21/20 0553 08/22/20 0600 08/23/20 0040 08/24/20 0234  WBC 12.1* 9.9 8.6 10.0 9.9  HGB 13.6  13.5 13.0 13.8 12.8  HCT 41.4 41.2 39.9 40.5 38.7  MCV 98.8 100.5* 98.8 98.3 97.7  PLT 158 146* 135* 171 062   Basic Metabolic Panel: Recent Labs  Lab 08/20/20 0431 08/21/20 0553 08/22/20 0600 08/23/20 0040 08/24/20 0234  NA 156* 152* 146* 142 141  K 3.1* 3.4* 3.4* 3.1* 3.7  CL 125* 121* 115* 109 105  CO2 21* 22 23 25 28   GLUCOSE 116* 107* 104* 105* 111*  BUN 28* 31* 16 8 12   CREATININE 0.81 0.99 0.69 0.61 0.92  CALCIUM 8.8* 8.8* 8.5* 8.5* 8.7*   GFR: CrCl cannot be calculated (Unknown ideal weight.). Liver Function Tests: No results for input(s): AST, ALT, ALKPHOS, BILITOT, PROT,  ALBUMIN in the last 168 hours. No results for input(s): LIPASE, AMYLASE in the last 168 hours. No results for input(s): AMMONIA in the last 168 hours. Coagulation Profile: No results for input(s): INR, PROTIME in the last 168 hours. Cardiac Enzymes: No results for input(s): CKTOTAL, CKMB, CKMBINDEX, TROPONINI in the last 168 hours. BNP (last 3 results) No results for input(s): PROBNP in the last 8760 hours. HbA1C: No results for input(s): HGBA1C in the last 72 hours. CBG: No results for input(s): GLUCAP in the last 168 hours. Lipid Profile: No results for input(s): CHOL, HDL, LDLCALC, TRIG, CHOLHDL, LDLDIRECT in the last 72 hours. Thyroid Function Tests: No results for input(s): TSH, T4TOTAL, FREET4, T3FREE, THYROIDAB in the last 72 hours. Anemia Panel: No results for input(s): VITAMINB12, FOLATE, FERRITIN, TIBC, IRON, RETICCTPCT in the last 72 hours. Urine analysis:    Component Value Date/Time   COLORURINE YELLOW 08/17/2020 1441   APPEARANCEUR CLEAR 08/17/2020 1441   LABSPEC 1.013 08/17/2020 1441   PHURINE 6.0 08/17/2020 1441   GLUCOSEU NEGATIVE 08/17/2020 1441   HGBUR SMALL (A) 08/17/2020 1441   BILIRUBINUR NEGATIVE 08/17/2020 1441   BILIRUBINUR NEG 10/19/2017 1447   KETONESUR 5 (A) 08/17/2020 1441   PROTEINUR NEGATIVE 08/17/2020 1441   UROBILINOGEN 0.2 10/19/2017 1447   UROBILINOGEN 0.2 06/09/2011 1431   NITRITE NEGATIVE 08/17/2020 1441   LEUKOCYTESUR NEGATIVE 08/17/2020 1441   Sepsis Labs: @LABRCNTIP (procalcitonin:4,lacticacidven:4)  ) Recent Results (from the past 240 hour(s))  Respiratory Panel by RT PCR (Flu A&B, Covid) - Nasopharyngeal Swab     Status: None   Collection Time: 08/16/20 12:26 AM   Specimen: Nasopharyngeal Swab  Result Value Ref Range Status   SARS Coronavirus 2 by RT PCR NEGATIVE NEGATIVE Final    Comment: (NOTE) SARS-CoV-2 target nucleic acids are NOT DETECTED.  The SARS-CoV-2 RNA is generally detectable in upper respiratoy specimens during  the acute phase of infection. The lowest concentration of SARS-CoV-2 viral copies this assay can detect is 131 copies/mL. A negative result does not preclude SARS-Cov-2 infection and should not be used as the sole basis for treatment or other patient management decisions. A negative result may occur with  improper specimen collection/handling, submission of specimen other than nasopharyngeal swab, presence of viral mutation(s) within the areas targeted by this assay, and inadequate number of viral copies (<131 copies/mL). A negative result must be combined with clinical observations, patient history, and epidemiological information. The expected result is Negative.  Fact Sheet for Patients:  PinkCheek.be  Fact Sheet for Healthcare Providers:  GravelBags.it  This test is no t yet approved or cleared by the [place] FDA and  has been authorized for detection and/or diagnosis of SARS-CoV-2 by FDA under an Emergency Use Authorization (EUA). This EUA will remain  in effect (meaning  this test can be used) for the duration of the COVID-19 declaration under Section 564(b)(1) of the Act, 21 U.S.C. section 360bbb-3(b)(1), unless the authorization is terminated or revoked sooner.     Influenza A by PCR NEGATIVE NEGATIVE Final   Influenza B by PCR NEGATIVE NEGATIVE Final    Comment: (NOTE) The Xpert Xpress SARS-CoV-2/FLU/RSV assay is intended as an aid in  the diagnosis of influenza from Nasopharyngeal swab specimens and  should not be used as a sole basis for treatment. Nasal washings and  aspirates are unacceptable for Xpert Xpress SARS-CoV-2/FLU/RSV  testing.  Fact Sheet for Patients: PinkCheek.be  Fact Sheet for Healthcare Providers: GravelBags.it  This test is not yet approved or cleared by the [place] FDA and  has been authorized for detection and/or  diagnosis of SARS-CoV-2 by  FDA under an Emergency Use Authorization (EUA). This EUA will remain  in effect (meaning this test can be used) for the duration of the  Covid-19 declaration under Section 564(b)(1) of the Act, 21  U.S.C. section 360bbb-3(b)(1), unless the authorization is  terminated or revoked. Performed at [facility], [place] [address], [place], [place] [address]   MRSA PCR Screening     Status: None   Collection Time: 08/16/20  1:42 AM   Specimen: Nasopharyngeal  Result Value Ref Range Status   MRSA by PCR NEGATIVE NEGATIVE Final    Comment:        The GeneXpert MRSA Assay (FDA approved for NASAL specimens only), is one component of a comprehensive MRSA colonization surveillance program. It is not intended to diagnose MRSA infection nor to guide or monitor treatment for MRSA infections. Performed at [facility], [place] [address], [place], [place] [address]          Radiology Studies: No results found.      Scheduled Meds: .  stroke: mapping our early stages of recovery book   Does not apply Once  . amLODipine  10 mg Oral Daily  . chlorhexidine  15 mL Mouth Rinse BID  . Chlorhexidine Gluconate Cloth  6 each Topical Daily  . cholecalciferol  2,000 Units Oral Daily  . feeding supplement (ENSURE ENLIVE)  237 mL Oral BID BM  . hydrochlorothiazide  12.5 mg Oral Daily  . [START ON 08/25/2020] influenza vaccine adjuvanted  0.5 mL Intramuscular Tomorrow-1000  . levothyroxine  75 mcg Oral Daily  . losartan  50 mg Oral Daily  . mouth rinse  15 mL Mouth Rinse q12n4p  . senna-docusate  1 tablet Oral BID   Continuous Infusions:   LOS: 9 days    Time spent: 25 minutes    Edwin Dada, MD [facility] 08/24/2020, 6:47 PM     Please page though [place] or Epic secure chat:  For [employer], Adult nurse

## 2020-08-25 LAB — CBC
HCT: 38.3 % (ref 36.0–46.0)
Hemoglobin: 12.8 g/dL (ref 12.0–15.0)
MCH: 33.2 pg (ref 26.0–34.0)
MCHC: 33.4 g/dL (ref 30.0–36.0)
MCV: 99.2 fL (ref 80.0–100.0)
Platelets: 213 10*3/uL (ref 150–400)
RBC: 3.86 MIL/uL — ABNORMAL LOW (ref 3.87–5.11)
RDW: 13.4 % (ref 11.5–15.5)
WBC: 9.7 10*3/uL (ref 4.0–10.5)
nRBC: 0.5 % — ABNORMAL HIGH (ref 0.0–0.2)

## 2020-08-25 LAB — BASIC METABOLIC PANEL
Anion gap: 10 (ref 5–15)
BUN: 13 mg/dL (ref 8–23)
CO2: 26 mmol/L (ref 22–32)
Calcium: 8.9 mg/dL (ref 8.9–10.3)
Chloride: 104 mmol/L (ref 98–111)
Creatinine, Ser: 0.81 mg/dL (ref 0.44–1.00)
GFR calc Af Amer: >60 mL/min (ref >60)
GFR calc non Af Amer: >60 mL/min (ref >60)
Glucose, Bld: 103 mg/dL — ABNORMAL HIGH (ref 70–99)
Potassium: 4.6 mmol/L (ref 3.5–5.1)
Sodium: 140 mmol/L (ref 135–145)

## 2020-08-25 NOTE — Progress Notes (Signed)
Physical Therapy Treatment Patient Details Name: Tara [name] MRN: [id number] DOB: 03/13/1936 Today's Date: 08/25/2020    History of Present Illness Tara [name] is an 84 y.o. female with a PMHx of right breast CA, HTN, hypothyroidism, pre-diabetes, recurrent UTI and prior stroke who  Demonstrating receptive and expressive aphasia, headache. head CT revealed a left parieto-occipital acute ICH.    PT Comments    Patient progressing well towards PT goals. This session focused on standing and gait training with use of RW and Min-Mod A for support. Limited by dizziness and nausea after second bout of ambulation. Minimal extensor tone present today during functional tasks. Continues to have cognitive deficits relating to memory, attention, problem solving and following multi step commands. Highly motivated. Great CIR candidate. Will follow and progress as tolerated.    Follow Up Recommendations  CIR     Equipment Recommendations  Other (comment) (defer to CIR)    Recommendations for Other Services       Precautions / Restrictions Precautions Precautions: Fall Precaution Comments: extensor tone, hx of vertigo Restrictions Weight Bearing Restrictions: No    Mobility  Bed Mobility               General bed mobility comments: Sitting on BSC upon PT arrival.  Transfers Overall transfer level: Needs assistance Equipment used: Rolling walker (2 wheeled) Transfers: Sit to/from Stand Sit to Stand: Mod assist;Min assist         General transfer comment: Min-Mod A to power to standing from [facility] x2, from chair x2 with cues for hand/foot placement. + dizziness. Able to take a few steps to get to chair with assist for balance, RW management and RLE.  Ambulation/Gait Ambulation/Gait assistance: Mod assist Gait Distance (Feet): 16 Feet (+ 15') Assistive device: Rolling walker (2 wheeled) Gait Pattern/deviations: Step-through pattern;Decreased step length - left;Decreased  stance time - right;Narrow base of support Gait velocity: decreased Gait velocity interpretation: <1.31 ft/sec, indicative of household ambulator General Gait Details: Slow, unsteady incoordinated gait through RLE with cues to widen boS and to increase step length on left. + dizziness and nausea. 1 seated rest break. Assist with RW navigation and forward momentum. Right inattention to environment.   Stairs             Wheelchair Mobility    Modified Rankin (Stroke Patients Only) Modified Rankin (Stroke Patients Only) Pre-Morbid Rankin Score: Slight disability Modified Rankin: Moderately severe disability     Balance Overall balance assessment: Needs assistance Sitting-balance support: Feet supported;No upper extremity supported Sitting balance-Leahy Scale: Fair     Standing balance support: During functional activity Standing balance-Leahy Scale: Poor Standing balance comment: Requires UE support in standing.                            Cognition Arousal/Alertness: Awake/alert Behavior During Therapy: WFL for tasks assessed/performed Overall Cognitive Status: Impaired/Different from baseline Area of Impairment: Orientation;Attention;Memory;Following commands                 Orientation Level: Disoriented to;Time Current Attention Level: Sustained Memory: Decreased recall of precautions;Decreased short-term memory Following Commands: Follows one step commands with increased time;Follows multi-step commands inconsistently Safety/Judgement: Decreased awareness of safety;Decreased awareness of deficits Awareness: Intellectual Problem Solving: Slow processing;Requires verbal cues;Difficulty sequencing General Comments: Pt requiring increased time and cues. Pt following simple 1-2 step commands. Pt with right inattention.      Exercises      General Comments  General comments (skin integrity, edema, etc.): Some mild extensor tone present at rest in  sitting in RLE however not notable during gait training or transfers today.      Pertinent Vitals/Pain Pain Assessment: No/denies pain    Home Living                      Prior Function            PT Goals (current goals can now be found in the care plan section) Progress towards PT goals: Progressing toward goals    Frequency    Min 4X/week      PT Plan Equipment recommendations need to be updated    Co-evaluation              AM-PAC PT "6 Clicks" Mobility   Outcome Measure  Help needed turning from your back to your side while in a flat bed without using bedrails?: A Little Help needed moving from lying on your back to sitting on the side of a flat bed without using bedrails?: A Lot Help needed moving to and from a bed to a chair (including a wheelchair)?: A Lot Help needed standing up from a chair using your arms (e.g., wheelchair or bedside chair)?: A Lot Help needed to walk in hospital room?: A Lot Help needed climbing 3-5 steps with a railing? : A Lot 6 Click Score: 13    End of Session Equipment Utilized During Treatment: Gait belt Activity Tolerance: Treatment limited secondary to medical complications (Comment) (dizziness and nausea (premorbid)) Patient left: in chair;with call bell/phone within reach;with chair alarm set Nurse Communication: Mobility status PT Visit Diagnosis: Other abnormalities of gait and mobility (R26.89);Other symptoms and signs involving the nervous system (G64.403)     Time: 4742-5956 PT Time Calculation (min) (ACUTE ONLY): 28 min  Charges:  $Gait Training: 8-22 mins $Therapeutic Activity: 8-22 mins                     Marisa Severin, PT, DPT Acute Rehabilitation Services Pager [phone] Office [place] 08/25/2020, 11:40 AM
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
## 2020-08-25 NOTE — Care Management Important Message (Signed)
Important Message  Patient Details  Name: Tara [name] MRN: [id number] Date of Birth: May 08, 1936   Medicare Important Message Given:  Yes     [name] 08/25/2020, 4:30 PM

## 2020-08-25 NOTE — Progress Notes (Signed)
[facility] PROGRESS NOTE    Tara [name]  DJS:[id number] DOB: 09/05/1936 DOA: 08/15/2020 PCP: [facility]      Brief Narrative:  Tara [name] is a 84 y.o. F with HTN, hypothyroidism, hx TIA, recurrent UTI, and herniated disc last June requiring surgery who presented now with aphasia and BP 226/115 and headache.  In the ER, CT head showed left parieto-occidipital ICH.       Assessment & Plan:  Left parieto-occipital ICH Hypertension See summary from 10/3 BP well controlled -Continue amlodipine, losartan, HCTZ   Hypothyroidism -Continue levothyroxine  Hypokalemia Resolved  Stage pressure injury sacrum, not POA     Disposition: Status is: Inpatient  Remains inpatient appropriate because:Unsafe d/c plan   Dispo: The patient is from: ALF              Anticipated d/c is to: SNF              Anticipated d/c date is: 2 days              Patient currently is not medically stable to d/c.    Patient was admitted with intracerebral hemorrhage.  Medically ready for discharge to SNF, the patient is weak, tolerated only 18 minutes of therapy today, which was her first day out of bed, and unlikely to be able to tolerate several hours of intensive therapy per day, but would likely benefit greatly from a more gradual and less intensive therapy regimen.   Medically ready for discharge        MDM: The below labs and imaging reports were reviewed and summarized above.  Medication management as above.    DVT prophylaxis: SCD's Start: 08/15/20 2310  Code Status: DNR Family Communication:            Subjective: No new fever, abdominal symptoms, respiratory symptoms, headache.  No seizures, focal weakness, numbness or LOC.         Objective: Vitals:   08/25/20 0349 08/25/20 0732 08/25/20 1142 08/25/20 1601  BP: 133/70 (!) 153/66 (!) 160/81 (!) 141/61  Pulse: 63 61 70 70  Resp: 17 20 16 20   Temp: (!) 97.4 F  (36.3 C) 98 F (36.7 C) 97.7 F (36.5 C) 98.4 F (36.9 C)  TempSrc: Oral Oral Oral Oral  SpO2: 96% 97% 98% 98%  Weight: 57.6 kg       Intake/Output Summary (Last 24 hours) at 08/25/2020 1945 Last data filed at 08/25/2020 1815 Gross per 24 hour  Intake 480 ml  Output 2100 ml  Net -1620 ml   Filed Weights   08/20/20 0400 08/23/20 0318 08/25/20 0349  Weight: 75.2 kg 57.6 kg 57.6 kg    Examination: General appearance: Elderly female, sleeping, arouses easily, interactive, no obvious distress.   HEENT: Anicteric, conjunctival pink, lids and lashes normal.  No nasal deformity, discharge, or epistaxis. Skin: Appropriate for age. Cardiac: RRR, no murmurs appreciated, no lower extremity edema.  Shins are tender, at baseline. Respiratory: Normal respiratory rate and rhythm, lungs clear without rales or wheezes. Abdomen: Abdomen soft, no tenderness palpation, no guarding, no ascites or distention. MSK:  Neuro: Sleeping but easily arousable, moves all extremities with generalized weakness, speech fluent but sometimes word finding difficulties. Psych: Affect blunted, attention normal, judgment insight appear somewhat impaired    Data Reviewed: I have personally reviewed following labs and imaging studies:  CBC: Recent Labs  Lab 08/21/20 0553 08/22/20 0600 08/23/20 0040 08/24/20 0234 08/25/20 0142  WBC 9.9  8.6 10.0 9.9 9.7  HGB 13.5 13.0 13.8 12.8 12.8  HCT 41.2 39.9 40.5 38.7 38.3  MCV 100.5* 98.8 98.3 97.7 99.2  PLT 146* 135* 171 186 888   Basic Metabolic Panel: Recent Labs  Lab 08/21/20 0553 08/22/20 0600 08/23/20 0040 08/24/20 0234 08/25/20 0142  NA 152* 146* 142 141 140  K 3.4* 3.4* 3.1* 3.7 4.6  CL 121* 115* 109 105 104  CO2 22 23 25 28 26   GLUCOSE 107* 104* 105* 111* 103*  BUN 31* 16 8 12 13   CREATININE 0.99 0.69 0.61 0.92 0.81  CALCIUM 8.8* 8.5* 8.5* 8.7* 8.9   GFR: CrCl cannot be calculated (Unknown ideal weight.). Liver Function Tests: No results for  input(s): AST, ALT, ALKPHOS, BILITOT, PROT, ALBUMIN in the last 168 hours. No results for input(s): LIPASE, AMYLASE in the last 168 hours. No results for input(s): AMMONIA in the last 168 hours. Coagulation Profile: No results for input(s): INR, PROTIME in the last 168 hours. Cardiac Enzymes: No results for input(s): CKTOTAL, CKMB, CKMBINDEX, TROPONINI in the last 168 hours. BNP (last 3 results) No results for input(s): PROBNP in the last 8760 hours. HbA1C: No results for input(s): HGBA1C in the last 72 hours. CBG: No results for input(s): GLUCAP in the last 168 hours. Lipid Profile: No results for input(s): CHOL, HDL, LDLCALC, TRIG, CHOLHDL, LDLDIRECT in the last 72 hours. Thyroid Function Tests: No results for input(s): TSH, T4TOTAL, FREET4, T3FREE, THYROIDAB in the last 72 hours. Anemia Panel: No results for input(s): VITAMINB12, FOLATE, FERRITIN, TIBC, IRON, RETICCTPCT in the last 72 hours. Urine analysis:    Component Value Date/Time   COLORURINE YELLOW 08/17/2020 1441   APPEARANCEUR CLEAR 08/17/2020 1441   LABSPEC 1.013 08/17/2020 1441   PHURINE 6.0 08/17/2020 1441   GLUCOSEU NEGATIVE 08/17/2020 1441   HGBUR SMALL (A) 08/17/2020 1441   BILIRUBINUR NEGATIVE 08/17/2020 1441   BILIRUBINUR NEG 10/19/2017 1447   KETONESUR 5 (A) 08/17/2020 1441   PROTEINUR NEGATIVE 08/17/2020 1441   UROBILINOGEN 0.2 10/19/2017 1447   UROBILINOGEN 0.2 06/09/2011 1431   NITRITE NEGATIVE 08/17/2020 1441   LEUKOCYTESUR NEGATIVE 08/17/2020 1441   Sepsis Labs: @LABRCNTIP (procalcitonin:4,lacticacidven:4)  ) Recent Results (from the past 240 hour(s))  Respiratory Panel by RT PCR (Flu A&B, Covid) - Nasopharyngeal Swab     Status: None   Collection Time: 08/16/20 12:26 AM   Specimen: Nasopharyngeal Swab  Result Value Ref Range Status   SARS Coronavirus 2 by RT PCR NEGATIVE NEGATIVE Final    Comment: (NOTE) SARS-CoV-2 target nucleic acids are NOT DETECTED.  The SARS-CoV-2 RNA is generally  detectable in upper respiratoy specimens during the acute phase of infection. The lowest concentration of SARS-CoV-2 viral copies this assay can detect is 131 copies/mL. A negative result does not preclude SARS-Cov-2 infection and should not be used as the sole basis for treatment or other patient management decisions. A negative result may occur with  improper specimen collection/handling, submission of specimen other than nasopharyngeal swab, presence of viral mutation(s) within the areas targeted by this assay, and inadequate number of viral copies (<131 copies/mL). A negative result must be combined with clinical observations, patient history, and epidemiological information. The expected result is Negative.  Fact Sheet for Patients:  PinkCheek.be  Fact Sheet for Healthcare Providers:  GravelBags.it  This test is no t yet approved or cleared by the [place] FDA and  has been authorized for detection and/or diagnosis of SARS-CoV-2 by FDA under an Emergency Use Authorization (EUA). This  EUA will remain  in effect (meaning this test can be used) for the duration of the COVID-19 declaration under Section 564(b)(1) of the Act, 21 U.S.C. section 360bbb-3(b)(1), unless the authorization is terminated or revoked sooner.     Influenza A by PCR NEGATIVE NEGATIVE Final   Influenza B by PCR NEGATIVE NEGATIVE Final    Comment: (NOTE) The Xpert Xpress SARS-CoV-2/FLU/RSV assay is intended as an aid in  the diagnosis of influenza from Nasopharyngeal swab specimens and  should not be used as a sole basis for treatment. Nasal washings and  aspirates are unacceptable for Xpert Xpress SARS-CoV-2/FLU/RSV  testing.  Fact Sheet for Patients: PinkCheek.be  Fact Sheet for Healthcare Providers: GravelBags.it  This test is not yet approved or cleared by the [place] FDA and   has been authorized for detection and/or diagnosis of SARS-CoV-2 by  FDA under an Emergency Use Authorization (EUA). This EUA will remain  in effect (meaning this test can be used) for the duration of the  Covid-19 declaration under Section 564(b)(1) of the Act, 21  U.S.C. section 360bbb-3(b)(1), unless the authorization is  terminated or revoked. Performed at [facility], [place] [address], [place], [place] [address]   MRSA PCR Screening     Status: None   Collection Time: 08/16/20  1:42 AM   Specimen: Nasopharyngeal  Result Value Ref Range Status   MRSA by PCR NEGATIVE NEGATIVE Final    Comment:        The GeneXpert MRSA Assay (FDA approved for NASAL specimens only), is one component of a comprehensive MRSA colonization surveillance program. It is not intended to diagnose MRSA infection nor to guide or monitor treatment for MRSA infections. Performed at [facility], [place] [address] [place], [place] [address]          Radiology Studies: No results found.      Scheduled Meds: .  stroke: mapping our early stages of recovery book   Does not apply Once  . amLODipine  10 mg Oral Daily  . chlorhexidine  15 mL Mouth Rinse BID  . cholecalciferol  2,000 Units Oral Daily  . feeding supplement (ENSURE ENLIVE)  237 mL Oral BID BM  . hydrochlorothiazide  12.5 mg Oral Daily  . levothyroxine  75 mcg Oral Daily  . losartan  50 mg Oral Daily  . mouth rinse  15 mL Mouth Rinse q12n4p  . senna-docusate  1 tablet Oral BID   Continuous Infusions:   LOS: 10 days    Time spent: 25 minutes    Edwin Dada, MD [facility] 08/25/2020, 7:45 PM     Please page though [place] or Epic secure chat:  For [employer], Adult nurse

## 2020-08-25 NOTE — Progress Notes (Signed)
Inpatient Rehab Admissions Coordinator:   Insurance authorization pending.  I have no beds available for this patient on CIR today.  Will continue to follow for insurance determination and bed availability.   Shann Medal, PT, DPT Admissions Coordinator [phone] 08/25/20  12:40 PM

## 2020-08-26 ENCOUNTER — Inpatient Hospital Stay (HOSPITAL_COMMUNITY): Payer: Medicare Other

## 2020-08-26 DIAGNOSIS — R42 Dizziness and giddiness: Secondary | ICD-10-CM

## 2020-08-26 DIAGNOSIS — E870 Hyperosmolality and hypernatremia: Secondary | ICD-10-CM

## 2020-08-26 DIAGNOSIS — R110 Nausea: Secondary | ICD-10-CM

## 2020-08-26 LAB — BASIC METABOLIC PANEL
Anion gap: 10 (ref 5–15)
BUN: 10 mg/dL (ref 8–23)
CO2: 26 mmol/L (ref 22–32)
Calcium: 8.9 mg/dL (ref 8.9–10.3)
Chloride: 103 mmol/L (ref 98–111)
Creatinine, Ser: 0.85 mg/dL (ref 0.44–1.00)
GFR calc non Af Amer: >60 mL/min (ref >60)
Glucose, Bld: 118 mg/dL — ABNORMAL HIGH (ref 70–99)
Potassium: 3.2 mmol/L — ABNORMAL LOW (ref 3.5–5.1)
Sodium: 139 mmol/L (ref 135–145)

## 2020-08-26 LAB — CBC
HCT: 38.3 % (ref 36.0–46.0)
Hemoglobin: 12.8 g/dL (ref 12.0–15.0)
MCH: 33.1 pg (ref 26.0–34.0)
MCHC: 33.4 g/dL (ref 30.0–36.0)
MCV: 99 fL (ref 80.0–100.0)
Platelets: 208 10*3/uL (ref 150–400)
RBC: 3.87 MIL/uL (ref 3.87–5.11)
RDW: 13.1 % (ref 11.5–15.5)
WBC: 9.9 10*3/uL (ref 4.0–10.5)
nRBC: 0 % (ref 0.0–0.2)

## 2020-08-26 MED ORDER — ADULT MULTIVITAMIN W/MINERALS CH
1.0000 | ORAL_TABLET | Freq: Every day | ORAL | Status: DC
  Administered 2020-08-26 – 2020-08-27 (×2): 1 via ORAL
  Filled 2020-08-26 (×2): qty 1

## 2020-08-26 MED ORDER — MECLIZINE HCL 12.5 MG PO TABS
12.5000 mg | ORAL_TABLET | Freq: Three times a day (TID) | ORAL | Status: DC | PRN
  Administered 2020-08-26 – 2020-08-27 (×2): 12.5 mg via ORAL
  Filled 2020-08-26 (×2): qty 1

## 2020-08-26 MED ORDER — ONDANSETRON HCL 4 MG/2ML IJ SOLN: 4.0000 mg | Freq: Four times a day (QID) | INTRAMUSCULAR | Status: DC | PRN

## 2020-08-26 MED ORDER — POTASSIUM CHLORIDE CRYS ER 20 MEQ PO TBCR
40.0000 meq | EXTENDED_RELEASE_TABLET | Freq: Two times a day (BID) | ORAL | Status: AC
  Administered 2020-08-26 (×2): 40 meq via ORAL
  Filled 2020-08-26 (×2): qty 2

## 2020-08-26 MED ORDER — BOOST / RESOURCE BREEZE PO LIQD CUSTOM
1.0000 | Freq: Three times a day (TID) | ORAL | Status: DC
  Administered 2020-08-26 – 2020-08-27 (×3): 1 via ORAL

## 2020-08-26 NOTE — Progress Notes (Signed)
PROGRESS NOTE    Tara KRONTZ  [name]:[id number] DOB: 1936/08/19 DOA: 08/15/2020 PCP: [facility]   Brief Narrative:  Patient is an 84 year old elderly Caucasian female with a past medical history significant for but not limited to hypertension, hypothyroidism, history of TIA, history of recurrent UTIs as well as a history of a herniated disc last June requiring surgery who presented with aphasia and elevated blood pressure and a headache.  In the ED head CT was done and showed a left parietal occipital intracranial hemorrhage.  The likely etiology of her left parieto-occipital intracerebral hemorrhage was secondary to hypertension versus cerebral amyloid angiopathy.  She is admitted to the ICU and started on Cleviprex drip and hypertonic saline.  Serial head CTs were obtained which were reassuring and her mental status remained stable.  She went under a full neurological work-up and was initiated on a Cleviprex drip which is now titrated off.  Her cerebral edema appeared to be improved and no further hypertonic saline was given.  She was transferred to [facility] service on hospital day 7 on 08/22/2020 and has been relatively stable but today she complained of some dizziness and nausea so I discussed the case with Dr. Erlinda Hong who recommends obtaining a head CT without contrast trying low-dose meclizine.  Assessment & Plan:   Active Problems:   ICH (intracerebral hemorrhage) (HCC)   Pressure injury of skin  Left Parieto-Occipital Intracerebral Hemorrhage -Etiology likely hypertensive vs cerebral amyloid angiopathy   -Patient admitted to ICU and started on Cleviprex and hypertonic saline.   -Serial CT head obtained, reassuring, mental status stable. -Echo unremarkable.  LDL 58.  A1c non-diaebtic.  EEG without epileptiform discharges but showed generalized and Left Lateral Slowing.   -Aspirin contraindicated. -Cleviprex and hypertonic saline now titrated off.  Currently on  amlodipine. -Continue Amlodipine 10 mg po Daily and Losartan 50 mg po daily; Will stop HCTZ due to dizziness and Hypokalemia -Neurology recommending no antithrombotic given hemorrhage and possible Cerebral Amyloid Angiopathy  Dizziness and Nausea -Patient states it is chronic but has been slightly worsened -Start Meclizine 12.5 mg po TIDprn and c/w Antiemetics and add IV Ondansetron  -Discussed with Neuro Dr. Erlinda Hong who recommends repeating a Head CT Scan w/o Contrast -May need Vestibular Testing  -Will check Orthostatic VS  Cerebral edema and therapy-induced hypernatremia -Exam appears improving, no further hypertonic saline recommended by Neuro -Was started on 3% and now off -Na+ is now 139 -Continue to Monitor   Hypernatremia  -Resolved -Allow Na to auto-correct and has now normalized   Hypertension -Was Admitted and started on Cleviprex and now weaned off  -Blood pressure improving and last BP was 146/70 -Continue Amlodipine 10 mg po Daily, Losartan 50 mg po daily; HCTZ discontinued due to Dizziness -C/w Hydralazine 10 mg q6hprn SBP <160 and Labetalol 10-40 mg q2hprn SBP <160   Hypothyroidism -Continue Levothyroxine 75 mcg po Daily -Check TSH in the AM   Hypokalemia -Patient's K+ this AM was 3.2 and likely dropped in the setting of HCTZ -Replete with po KCl 40 mEQ BID x2 -Continue to Monitor and Replete as Necessary -Repeat CMP   Hyperglycemia -CBG's ranging from 104-125 on Daily BMP's -Likely reactive as recent HbA1c was 5.5 on 08/15/20 -Continue to Monitor and Trend -If Necessary will need to place on sensitive Novolog SSI AC  GOC: DNR, poA  DVT prophylaxis: SCDs given Intracerbral Hemorrhage Code Status: DO NOT RESUSCITATE  Family Communication: No family present at bedside  Disposition Plan: CIR pending  bed availability   Status is: Inpatient  Remains inpatient appropriate because:Inpatient level of care appropriate due to severity of illness   Dispo:  The patient is from: Home              Anticipated d/c is to: CIR              Anticipated d/c date is: 1 day              Patient currently is not medically stable to d/c.  Consultants:   Neurology    Procedures:  EEG 9/27 IMPRESSION: This study is suggestive of cortical dysfunction left hemisphere likely secondary to underlying hemorrhage as well as severe diffuse encephalopathy, nonspecific etiology. No seizures or epileptiform discharges were seen throughout the recording.  Antimicrobials:  Anti-infectives (From admission, onward)   None       Subjective: Seen and examined this morning and she is feeling dizzy and nauseous.  No chest pain or shortness of breath.  States that she did not feel very well today.  No other concerns or complaints at this time.  Objective: Vitals:   08/25/20 2000 08/25/20 2336 08/26/20 0340 08/26/20 0738  BP: 138/86 136/68 (!) 142/64 122/61  Pulse: 66 67 63 67  Resp: 18 18 17 18   Temp: 98.2 F (36.8 C) 98.3 F (36.8 C) 98.2 F (36.8 C) 98.6 F (37 C)  TempSrc: Oral Oral Oral Oral  SpO2: 97% 94% 96% 94%  Weight:        Intake/Output Summary (Last 24 hours) at 08/26/2020 7829 Last data filed at 08/25/2020 1815 Gross per 24 hour  Intake 480 ml  Output 1500 ml  Net -1020 ml   Filed Weights   08/20/20 0400 08/23/20 0318 08/25/20 0349  Weight: 75.2 kg 57.6 kg 57.6 kg   Examination: Physical Exam:  Constitutional: WN/WD thin Caucasian female in NAD and appears mildly anxious but slightly uncomfortable Eyes: Lids and conjunctivae normal, sclerae anicteric  ENMT: External Ears, Nose appear normal. Grossly normal hearing. Neck: Appears normal, supple, no cervical masses, normal ROM, no appreciable thyromegaly; no JVD Respiratory: Diminished to auscultation bilaterally, no wheezing, rales, rhonchi or crackles. Normal respiratory effort and patient is not tachypenic. No accessory muscle use.  Cardiovascular: RRR, no murmurs / rubs / gallops.  S1 and S2 auscultated. Abdomen: Soft, non-tender, non-distended. Bowel sounds positive.  GU: Deferred. Musculoskeletal: No clubbing / cyanosis of digits/nails. No joint deformity upper and lower extremities.  Skin: No rashes, lesions, ulcers on a limited skin evaluation. No induration; Warm and dry.  Neurologic: CN 2-12 grossly intact with no focal deficits. Romberg sign and cerebellar reflexes not assessed.  Psychiatric: Normal judgment and insight. Alert and oriented x 3. Slightly anxious mood and appropriate affect.   Data Reviewed: I have personally reviewed following labs and imaging studies  CBC: Recent Labs  Lab 08/22/20 0600 08/23/20 0040 08/24/20 0234 08/25/20 0142 08/26/20 0338  WBC 8.6 10.0 9.9 9.7 9.9  HGB 13.0 13.8 12.8 12.8 12.8  HCT 39.9 40.5 38.7 38.3 38.3  MCV 98.8 98.3 97.7 99.2 99.0  PLT 135* 171 186 213 562   Basic Metabolic Panel: Recent Labs  Lab 08/22/20 0600 08/23/20 0040 08/24/20 0234 08/25/20 0142 08/26/20 0338  NA 146* 142 141 140 139  K 3.4* 3.1* 3.7 4.6 3.2*  CL 115* 109 105 104 103  CO2 23 25 28 26 26   GLUCOSE 104* 105* 111* 103* 118*  BUN 16 8 12 13 10   CREATININE 0.69  0.61 0.92 0.81 0.85  CALCIUM 8.5* 8.5* 8.7* 8.9 8.9   GFR: CrCl cannot be calculated (Unknown ideal weight.). Liver Function Tests: No results for input(s): AST, ALT, ALKPHOS, BILITOT, PROT, ALBUMIN in the last 168 hours. No results for input(s): LIPASE, AMYLASE in the last 168 hours. No results for input(s): AMMONIA in the last 168 hours. Coagulation Profile: No results for input(s): INR, PROTIME in the last 168 hours. Cardiac Enzymes: No results for input(s): CKTOTAL, CKMB, CKMBINDEX, TROPONINI in the last 168 hours. BNP (last 3 results) No results for input(s): PROBNP in the last 8760 hours. HbA1C: No results for input(s): HGBA1C in the last 72 hours. CBG: No results for input(s): GLUCAP in the last 168 hours. Lipid Profile: No results for input(s): CHOL, HDL,  LDLCALC, TRIG, CHOLHDL, LDLDIRECT in the last 72 hours. Thyroid Function Tests: No results for input(s): TSH, T4TOTAL, FREET4, T3FREE, THYROIDAB in the last 72 hours. Anemia Panel: No results for input(s): VITAMINB12, FOLATE, FERRITIN, TIBC, IRON, RETICCTPCT in the last 72 hours. Sepsis Labs: No results for input(s): PROCALCITON, LATICACIDVEN in the last 168 hours.  No results found for this or any previous visit (from the past 240 hour(s)).   RN Pressure Injury Documentation:     Estimated body mass index is 22.49 kg/m as calculated from the following:   Height as of 05/29/19: 5\' 3"  (1.6 m).   Weight as of this encounter: 57.6 kg.  Malnutrition Type:  Nutrition Problem: Inadequate oral intake Etiology: acute illness   Malnutrition Characteristics:  Signs/Symptoms: per patient/family report, NPO status   Nutrition Interventions:  Interventions: Ensure Enlive (each supplement provides 350kcal and 20 grams of protein)     Radiology Studies: No results found.  Scheduled Meds:   stroke: mapping our early stages of recovery book   Does not apply Once   amLODipine  10 mg Oral Daily   chlorhexidine  15 mL Mouth Rinse BID   cholecalciferol  2,000 Units Oral Daily   feeding supplement (ENSURE ENLIVE)  237 mL Oral BID BM   hydrochlorothiazide  12.5 mg Oral Daily   levothyroxine  75 mcg Oral Daily   losartan  50 mg Oral Daily   mouth rinse  15 mL Mouth Rinse q12n4p   potassium chloride  40 mEq Oral BID   senna-docusate  1 tablet Oral BID   Continuous Infusions:   LOS: 11 days   Kerney Elbe, DO [facility] PAGER is on AMION  If 7PM-7AM, please contact night-coverage [email or web address]

## 2020-08-26 NOTE — Progress Notes (Signed)
Physical Therapy Treatment Patient Details Name: Tara [name] MRN: [id number] DOB: [birth year] Today's Date: 08/26/2020    History of Present Illness Tara [name] is an 84 y.o. female with a PMHx of right breast CA, HTN, hypothyroidism, pre-diabetes, recurrent UTI and prior stroke who  Demonstrating receptive and expressive aphasia, headache. head CT revealed a left parieto-occipital acute ICH.    PT Comments    Pt progressing towards goals. Worked on sit<>stands this session and finding midline in standing. Required mod A to stand each time. Also worked on proper foot placement as pt likes to maintain very narrow BOS. Pt continues to report dizziness and nausea, however, did not seem to limit mobility. Current recommendations appropriate. Will continue to follow acutely.    Follow Up Recommendations  CIR     Equipment Recommendations  Other (comment) (defer to CIR )    Recommendations for Other Services       Precautions / Restrictions Precautions Precautions: Fall Precaution Comments: extensor tone, hx of vertigo Restrictions Weight Bearing Restrictions: No    Mobility  Bed Mobility Overal bed mobility: Needs Assistance Bed Mobility: Supine to Sit;Sit to Supine     Supine to sit: Mod assist Sit to supine: Min assist   General bed mobility comments: Mod A for trunk elevation. Increased time to come to sitting. L lateral lean in sitting. Required BLE assist for return to supine and assist with trunk descent.   Transfers Overall transfer level: Needs assistance Equipment used: 1 person hand held assist Transfers: Sit to/from Stand Sit to Stand: Mod assist         General transfer comment: Worked on multiple stands this session. Mod A for lift assist and steadying. Pt with L lateral lean in standing and worked on shifting to midline. Also worked on fixating gaze to help with dizziness. Attempted to take side steps, however, pt unable with +1 assist.    Ambulation/Gait                 Stairs             Wheelchair Mobility    Modified Rankin (Stroke Patients Only) Modified Rankin (Stroke Patients Only) Pre-Morbid Rankin Score: Slight disability Modified Rankin: Moderately severe disability     Balance Overall balance assessment: Needs assistance Sitting-balance support: Feet supported;No upper extremity supported Sitting balance-Leahy Scale: Fair   Postural control: Left lateral lean Standing balance support: Bilateral upper extremity supported Standing balance-Leahy Scale: Poor Standing balance comment: Required UE and external support                             Cognition Arousal/Alertness: Awake/alert Behavior During Therapy: WFL for tasks assessed/performed Overall Cognitive Status: Impaired/Different from baseline Area of Impairment: Attention;Memory;Following commands                   Current Attention Level: Sustained Memory: Decreased recall of precautions;Decreased short-term memory Following Commands: Follows one step commands with increased time;Follows multi-step commands inconsistently       General Comments: Pt with R sided inattention. Slow command following. Notable memory deficits.       Exercises General Exercises - Lower Extremity Ankle Circles/Pumps: AROM;Both;10 reps;Supine    General Comments        Pertinent Vitals/Pain Pain Assessment: No/denies pain    Home Living  Prior Function            PT Goals (current goals can now be found in the care plan section) Acute Rehab PT Goals Patient Stated Goal: to decrease dizziness.  PT Goal Formulation: With family Time For Goal Achievement: 08/30/20 Potential to Achieve Goals: Fair Progress towards PT goals: Progressing toward goals    Frequency    Min 4X/week      PT Plan Current plan remains appropriate    Co-evaluation              AM-PAC PT "6  Clicks" Mobility   Outcome Measure  Help needed turning from your back to your side while in a flat bed without using bedrails?: A Little Help needed moving from lying on your back to sitting on the side of a flat bed without using bedrails?: A Lot Help needed moving to and from a bed to a chair (including a wheelchair)?: A Lot Help needed standing up from a chair using your arms (e.g., wheelchair or bedside chair)?: A Lot Help needed to walk in hospital room?: A Lot Help needed climbing 3-5 steps with a railing? : Total 6 Click Score: 12    End of Session Equipment Utilized During Treatment: Gait belt Activity Tolerance: Treatment limited secondary to medical complications (Comment) (dizziness and nausea) Patient left: in bed;with call bell/phone within reach;with bed alarm set Nurse Communication: Mobility status PT Visit Diagnosis: Other abnormalities of gait and mobility (R26.89);Other symptoms and signs involving the nervous system (I96.789)     Time: 3810-1751 PT Time Calculation (min) (ACUTE ONLY): 20 min  Charges:  $Therapeutic Activity: 8-22 mins                     Lou Miner, DPT  Acute Rehabilitation Services  Pager: [phone] Office: [phone]    Rudean Hitt 08/26/2020, 10:47 AM

## 2020-08-26 NOTE — Progress Notes (Signed)
Nutrition Follow-up  DOCUMENTATION CODES:   Not applicable  INTERVENTION:  D/c Ensure  Boost Breeze po TID, each supplement provides 250 kcal and 9 grams of protein  MVI daily  NUTRITION DIAGNOSIS:   Inadequate oral intake related to acute illness as evidenced by per patient/family report, NPO status.  Progressing, pt now on dysphagia 3 diet with thin liquids  GOAL:   Patient will meet greater than or equal to 90% of their needs  progressing  MONITOR:   PO intake, Supplement acceptance, Labs, Weight trends  REASON FOR ASSESSMENT:   Consult Enteral/tube feeding initiation and management  ASSESSMENT:   84 yo female admitted with left acute parieto-occipital ICH. PMH includes HTN, pre DM, hx of prior stroke  Per MD, pt is medically stable for d/c to CIR pending bed availability.   Per RN, pt has been refusing Ensure Enlive (receiving BID). Will attempt alternative supplement.   PO Intake: 0-100% x last 8 recorded meals (42.5% average meal intake)  UOP: 1576ml x24 hours I/O: -1838.16ml since admit  Labs: K+ 3.2 (L) Medications:Vitamin D3, Klor-con, Senokot-S  Diet Order:   Diet Order            DIET DYS 3 Room service appropriate? Yes with Assist; Fluid consistency: Thin  Diet effective now                 EDUCATION NEEDS:   Not appropriate for education at this time  Skin:  Skin Assessment: Reviewed RN Assessment Skin Integrity Issues:: Other (Comment) Other: device related pressure injury to bilateral wrists  Last BM:  10/5 type 7  Height:   Ht Readings from Last 1 Encounters:  05/29/19 5\' 3"  (1.6 m)    Weight:   Wt Readings from Last 1 Encounters:  08/25/20 57.6 kg   BMI:  Body mass index is 22.49 kg/m.  Estimated Nutritional Needs:   Kcal:  1600-1800 kcals  Protein:  80-90 g  Fluid:  >/= 1.6 L    Larkin Ina, MS, RD, LDN RD pager number and weekend/on-call pager number located in [place].

## 2020-08-26 NOTE — Progress Notes (Signed)
Inpatient Rehab Admissions Coordinator:   I received authorization from [facility] for CIR.  I do not have a bed available for this pt today.  Will continue to follow for timing of admission pending bed availability.   Shann Medal, PT, DPT Admissions Coordinator [phone] 08/26/20  1:12 PM

## 2020-08-27 ENCOUNTER — Other Ambulatory Visit: Payer: Self-pay

## 2020-08-27 ENCOUNTER — Encounter (HOSPITAL_COMMUNITY): Payer: Self-pay | Admitting: Physical Medicine & Rehabilitation

## 2020-08-27 ENCOUNTER — Inpatient Hospital Stay (HOSPITAL_COMMUNITY)
Admission: RE | Admit: 2020-08-27 | Discharge: 2020-09-15 | DRG: 056 | Disposition: A | Discharge status: Skilled Nursing Facility | Payer: Medicare Other | Source: Intra-hospital | Attending: Physical Medicine & Rehabilitation | Admitting: Physical Medicine & Rehabilitation

## 2020-08-27 DIAGNOSIS — Z20822 Contact with and (suspected) exposure to covid-19: Secondary | ICD-10-CM | POA: Diagnosis present

## 2020-08-27 DIAGNOSIS — Z8249 Family history of ischemic heart disease and other diseases of the circulatory system: Secondary | ICD-10-CM | POA: Diagnosis not present

## 2020-08-27 DIAGNOSIS — Z96652 Presence of left artificial knee joint: Secondary | ICD-10-CM | POA: Diagnosis not present

## 2020-08-27 DIAGNOSIS — I69120 Aphasia following nontraumatic intracerebral hemorrhage: Secondary | ICD-10-CM | POA: Diagnosis not present

## 2020-08-27 DIAGNOSIS — R32 Unspecified urinary incontinence: Secondary | ICD-10-CM | POA: Diagnosis present

## 2020-08-27 DIAGNOSIS — E039 Hypothyroidism, unspecified: Secondary | ICD-10-CM | POA: Diagnosis present

## 2020-08-27 DIAGNOSIS — Z803 Family history of malignant neoplasm of breast: Secondary | ICD-10-CM | POA: Diagnosis not present

## 2020-08-27 DIAGNOSIS — R7303 Prediabetes: Secondary | ICD-10-CM | POA: Diagnosis not present

## 2020-08-27 DIAGNOSIS — E876 Hypokalemia: Secondary | ICD-10-CM | POA: Diagnosis not present

## 2020-08-27 DIAGNOSIS — Z87440 Personal history of urinary (tract) infections: Secondary | ICD-10-CM | POA: Diagnosis not present

## 2020-08-27 DIAGNOSIS — F411 Generalized anxiety disorder: Secondary | ICD-10-CM | POA: Diagnosis present

## 2020-08-27 DIAGNOSIS — Z7982 Long term (current) use of aspirin: Secondary | ICD-10-CM

## 2020-08-27 DIAGNOSIS — N318 Other neuromuscular dysfunction of bladder: Secondary | ICD-10-CM | POA: Diagnosis present

## 2020-08-27 DIAGNOSIS — R42 Dizziness and giddiness: Secondary | ICD-10-CM | POA: Diagnosis present

## 2020-08-27 DIAGNOSIS — F32A Depression, unspecified: Secondary | ICD-10-CM | POA: Diagnosis present

## 2020-08-27 DIAGNOSIS — Z79899 Other long term (current) drug therapy: Secondary | ICD-10-CM | POA: Diagnosis not present

## 2020-08-27 DIAGNOSIS — Z96611 Presence of right artificial shoulder joint: Secondary | ICD-10-CM | POA: Diagnosis not present

## 2020-08-27 DIAGNOSIS — H8110 Benign paroxysmal vertigo, unspecified ear: Secondary | ICD-10-CM | POA: Diagnosis not present

## 2020-08-27 DIAGNOSIS — Z79890 Hormone replacement therapy (postmenopausal): Secondary | ICD-10-CM | POA: Diagnosis not present

## 2020-08-27 DIAGNOSIS — R519 Headache, unspecified: Secondary | ICD-10-CM | POA: Diagnosis not present

## 2020-08-27 DIAGNOSIS — Z885 Allergy status to narcotic agent status: Secondary | ICD-10-CM | POA: Diagnosis not present

## 2020-08-27 DIAGNOSIS — D72829 Elevated white blood cell count, unspecified: Secondary | ICD-10-CM

## 2020-08-27 DIAGNOSIS — I10 Essential (primary) hypertension: Secondary | ICD-10-CM | POA: Diagnosis present

## 2020-08-27 DIAGNOSIS — F419 Anxiety disorder, unspecified: Secondary | ICD-10-CM | POA: Diagnosis not present

## 2020-08-27 DIAGNOSIS — Z8049 Family history of malignant neoplasm of other genital organs: Secondary | ICD-10-CM

## 2020-08-27 DIAGNOSIS — Z881 Allergy status to other antibiotic agents status: Secondary | ICD-10-CM

## 2020-08-27 DIAGNOSIS — I619 Nontraumatic intracerebral hemorrhage, unspecified: Secondary | ICD-10-CM | POA: Diagnosis not present

## 2020-08-27 DIAGNOSIS — Z96651 Presence of right artificial knee joint: Secondary | ICD-10-CM | POA: Diagnosis not present

## 2020-08-27 DIAGNOSIS — R3915 Urgency of urination: Secondary | ICD-10-CM | POA: Diagnosis not present

## 2020-08-27 DIAGNOSIS — I69298 Other sequelae of other nontraumatic intracranial hemorrhage: Secondary | ICD-10-CM | POA: Diagnosis not present

## 2020-08-27 DIAGNOSIS — D72828 Other elevated white blood cell count: Secondary | ICD-10-CM | POA: Diagnosis not present

## 2020-08-27 DIAGNOSIS — I69220 Aphasia following other nontraumatic intracranial hemorrhage: Secondary | ICD-10-CM | POA: Diagnosis not present

## 2020-08-27 DIAGNOSIS — M48062 Spinal stenosis, lumbar region with neurogenic claudication: Secondary | ICD-10-CM | POA: Diagnosis not present

## 2020-08-27 DIAGNOSIS — I611 Nontraumatic intracerebral hemorrhage in hemisphere, cortical: Secondary | ICD-10-CM

## 2020-08-27 DIAGNOSIS — Z888 Allergy status to other drugs, medicaments and biological substances status: Secondary | ICD-10-CM

## 2020-08-27 DIAGNOSIS — N390 Urinary tract infection, site not specified: Secondary | ICD-10-CM | POA: Diagnosis not present

## 2020-08-27 DIAGNOSIS — K5900 Constipation, unspecified: Secondary | ICD-10-CM | POA: Diagnosis not present

## 2020-08-27 DIAGNOSIS — G936 Cerebral edema: Secondary | ICD-10-CM | POA: Diagnosis not present

## 2020-08-27 DIAGNOSIS — I610 Nontraumatic intracerebral hemorrhage in hemisphere, subcortical: Secondary | ICD-10-CM

## 2020-08-27 DIAGNOSIS — R0989 Other specified symptoms and signs involving the circulatory and respiratory systems: Secondary | ICD-10-CM

## 2020-08-27 DIAGNOSIS — Z96653 Presence of artificial knee joint, bilateral: Secondary | ICD-10-CM | POA: Diagnosis not present

## 2020-08-27 DIAGNOSIS — Z853 Personal history of malignant neoplasm of breast: Secondary | ICD-10-CM

## 2020-08-27 DIAGNOSIS — B965 Pseudomonas (aeruginosa) (mallei) (pseudomallei) as the cause of diseases classified elsewhere: Secondary | ICD-10-CM | POA: Diagnosis not present

## 2020-08-27 DIAGNOSIS — Z823 Family history of stroke: Secondary | ICD-10-CM | POA: Diagnosis not present

## 2020-08-27 DIAGNOSIS — N319 Neuromuscular dysfunction of bladder, unspecified: Secondary | ICD-10-CM | POA: Diagnosis not present

## 2020-08-27 LAB — COMPREHENSIVE METABOLIC PANEL
ALT: 46 U/L — ABNORMAL HIGH (ref 0–44)
AST: 50 U/L — ABNORMAL HIGH (ref 15–41)
Albumin: 2.7 g/dL — ABNORMAL LOW (ref 3.5–5.0)
Alkaline Phosphatase: 59 U/L (ref 38–126)
Anion gap: 7 (ref 5–15)
BUN: 12 mg/dL (ref 8–23)
CO2: 29 mmol/L (ref 22–32)
Calcium: 9.1 mg/dL (ref 8.9–10.3)
Chloride: 103 mmol/L (ref 98–111)
Creatinine, Ser: 0.84 mg/dL (ref 0.44–1.00)
GFR calc non Af Amer: >60 mL/min (ref >60)
Glucose, Bld: 102 mg/dL — ABNORMAL HIGH (ref 70–99)
Potassium: 4.4 mmol/L (ref 3.5–5.1)
Sodium: 139 mmol/L (ref 135–145)
Total Bilirubin: 0.8 mg/dL (ref 0.3–1.2)
Total Protein: 5.8 g/dL — ABNORMAL LOW (ref 6.5–8.1)

## 2020-08-27 LAB — CBC WITH DIFFERENTIAL/PLATELET
Abs Immature Granulocytes: 0.07 10*3/uL (ref 0.00–0.07)
Basophils Absolute: 0.1 10*3/uL (ref 0.0–0.1)
Basophils Relative: 1 %
Eosinophils Absolute: 0.5 10*3/uL (ref 0.0–0.5)
Eosinophils Relative: 4 %
HCT: 37.5 % (ref 36.0–46.0)
Hemoglobin: 12.4 g/dL (ref 12.0–15.0)
Immature Granulocytes: 1 %
Lymphocytes Relative: 24 %
Lymphs Abs: 2.5 10*3/uL (ref 0.7–4.0)
MCH: 33.6 pg (ref 26.0–34.0)
MCHC: 33.1 g/dL (ref 30.0–36.0)
MCV: 101.6 fL — ABNORMAL HIGH (ref 80.0–100.0)
Monocytes Absolute: 1.2 10*3/uL — ABNORMAL HIGH (ref 0.1–1.0)
Monocytes Relative: 11 %
Neutro Abs: 6.3 10*3/uL (ref 1.7–7.7)
Neutrophils Relative %: 59 %
Platelets: 215 10*3/uL (ref 150–400)
RBC: 3.69 MIL/uL — ABNORMAL LOW (ref 3.87–5.11)
RDW: 13 % (ref 11.5–15.5)
WBC: 10.6 10*3/uL — ABNORMAL HIGH (ref 4.0–10.5)
nRBC: 0 % (ref 0.0–0.2)

## 2020-08-27 LAB — PHOSPHORUS: Phosphorus: 4.1 mg/dL (ref 2.5–4.6)

## 2020-08-27 LAB — MAGNESIUM: Magnesium: 1.9 mg/dL (ref 1.7–2.4)

## 2020-08-27 MED ORDER — MECLIZINE HCL 25 MG PO TABS: 12.5000 mg | ORAL_TABLET | Freq: Three times a day (TID) | ORAL | Status: DC | PRN

## 2020-08-27 MED ORDER — LOSARTAN POTASSIUM 50 MG PO TABS
50.0000 mg | ORAL_TABLET | Freq: Every day | ORAL | Status: DC
  Administered 2020-08-28 – 2020-09-03 (×7): 50 mg via ORAL
  Filled 2020-08-27 (×7): qty 1

## 2020-08-27 MED ORDER — ONDANSETRON HCL 4 MG PO TABS: 4.0000 mg | ORAL_TABLET | Freq: Three times a day (TID) | ORAL | 0 refills | Status: DC | PRN

## 2020-08-27 MED ORDER — SENNOSIDES-DOCUSATE SODIUM 8.6-50 MG PO TABS
1.0000 | ORAL_TABLET | Freq: Two times a day (BID) | ORAL | Status: DC
  Administered 2020-08-27 – 2020-09-02 (×12): 1 via ORAL
  Filled 2020-08-27 (×12): qty 1

## 2020-08-27 MED ORDER — ACETAMINOPHEN 160 MG/5ML PO SOLN: 650.0000 mg | ORAL | Status: DC | PRN

## 2020-08-27 MED ORDER — VITAMIN D 25 MCG (1000 UNIT) PO TABS
2000.0000 [IU] | ORAL_TABLET | Freq: Every day | ORAL | Status: DC
  Administered 2020-08-28 – 2020-09-15 (×19): 2000 [IU] via ORAL
  Filled 2020-08-27 (×19): qty 2

## 2020-08-27 MED ORDER — MECLIZINE HCL 12.5 MG PO TABS: 12.5000 mg | ORAL_TABLET | Freq: Three times a day (TID) | ORAL | 0 refills | Status: DC | PRN

## 2020-08-27 MED ORDER — STROKE: EARLY STAGES OF RECOVERY BOOK: 1.0000 | Freq: Once | 0 refills | Status: DC

## 2020-08-27 MED ORDER — SENNOSIDES-DOCUSATE SODIUM 8.6-50 MG PO TABS
1.0000 | ORAL_TABLET | Freq: Two times a day (BID) | ORAL | 0 refills | Status: AC
Start: 2020-08-27 — End: ?

## 2020-08-27 MED ORDER — LEVOTHYROXINE SODIUM 75 MCG PO TABS
75.0000 ug | ORAL_TABLET | Freq: Every day | ORAL | Status: DC
  Administered 2020-08-28 – 2020-09-15 (×19): 75 ug via ORAL
  Filled 2020-08-27 (×19): qty 1

## 2020-08-27 MED ORDER — BOOST / RESOURCE BREEZE PO LIQD CUSTOM
1.0000 | Freq: Three times a day (TID) | ORAL | Status: DC
  Administered 2020-08-27 – 2020-09-15 (×37): 1 via ORAL

## 2020-08-27 MED ORDER — LOSARTAN POTASSIUM 50 MG PO TABS
50.0000 mg | ORAL_TABLET | Freq: Every day | ORAL | 0 refills | Status: DC
Start: 2020-08-27 — End: 2020-09-15

## 2020-08-27 MED ORDER — ACETAMINOPHEN 650 MG RE SUPP: 650.0000 mg | RECTAL | Status: DC | PRN

## 2020-08-27 MED ORDER — DIAZEPAM 5 MG PO TABS: 5.0000 mg | ORAL_TABLET | Freq: Two times a day (BID) | ORAL | Status: DC | PRN

## 2020-08-27 MED ORDER — ADULT MULTIVITAMIN W/MINERALS CH: 1.0000 | ORAL_TABLET | Freq: Every day | ORAL | 0 refills | Status: DC

## 2020-08-27 MED ORDER — ONDANSETRON HCL 4 MG PO TABS: 4.0000 mg | ORAL_TABLET | Freq: Three times a day (TID) | ORAL | Status: DC | PRN

## 2020-08-27 MED ORDER — AMLODIPINE BESYLATE 10 MG PO TABS
10.0000 mg | ORAL_TABLET | Freq: Every day | ORAL | Status: DC
  Administered 2020-08-28 – 2020-09-15 (×19): 10 mg via ORAL
  Filled 2020-08-27 (×19): qty 1

## 2020-08-27 MED ORDER — ADULT MULTIVITAMIN W/MINERALS CH
1.0000 | ORAL_TABLET | Freq: Every day | ORAL | Status: DC
  Administered 2020-08-28 – 2020-09-15 (×19): 1 via ORAL
  Filled 2020-08-27 (×19): qty 1

## 2020-08-27 MED ORDER — ACETAMINOPHEN 325 MG PO TABS: 650.0000 mg | ORAL_TABLET | ORAL | Status: DC | PRN

## 2020-08-27 NOTE — Progress Notes (Addendum)
Inpatient Rehabilitation Medication Review by a Pharmacist  A complete drug regimen review was completed for this patient to identify any potential clinically significant medication issues.  Clinically significant medication issues were identified:  Yes   Type of Medication Issue Identified Description of Issue Urgent (address now) Non-Urgent (address on AM team rounds) Plan Plan Accepted by Provider? (Yes / No / Pending AM Rounds)  Drug Interaction(s) (clinically significant)       Duplicate Therapy       Allergy       No Medication Administration End Date       Incorrect Dose       Additional Drug Therapy Needed       Other  Levocetirizine was on pt's [facility] discharge med list (this is a home medication that was not ordered at [facility] or on admission to CIR) Non urgent Resume levocetirizine (loratadine) Per Silvestre Mesi, PA    Name of provider notified for urgent issues identified: N/A  For non-urgent medication issues to be resolved on team rounds tomorrow morning a [facility] Secure Chat Handoff was sent to:  N/A (med rec review completed after 5 PM)  Time spent performing this drug regimen review (minutes):  [place], PharmD, [place], AAHIVP, CPP Infectious Disease Pharmacist 08/28/2020 9:14 AM

## 2020-08-27 NOTE — Plan of Care (Signed)
Patient stable, discussed POC with patient, agreeable with plan. Discussed stroke education via stroke handbook via teach back, verbalizes understanding, denies question/concerns at this time.   

## 2020-08-27 NOTE — TOC Transition Note (Signed)
Transition of Care [facility]) - CM/SW Discharge Note   Patient Details  Name: Tara [name] MRN: [id number] Date of Birth: [birth year]  Transition of [facility]) CM/SW Contact:  Pollie Friar, RN Phone Number: 08/27/2020, 10:44 AM   Clinical Narrative:    Pt is discharging to CIR today. CM signing off.   Final next level of care: IP Rehab Facility Barriers to Discharge: No Barriers Identified   Patient Goals and CMS Choice        Discharge Placement                       Discharge Plan and Services     Post Acute Care Choice: IP Rehab                               Social Determinants of Health (SDOH) Interventions     Readmission Risk Interventions No flowsheet data found.

## 2020-08-27 NOTE — Progress Notes (Signed)
Physical Therapy Treatment Patient Details Name: Tara [name] MRN: [id number] DOB: 03-27-1936 Today's Date: 08/27/2020    History of Present Illness Tara [name] is an 84 y.o. female with a PMHx of right breast CA, HTN, hypothyroidism, pre-diabetes, recurrent UTI and prior stroke who  Demonstrating receptive and expressive aphasia, headache. head CT revealed a left parieto-occipital acute ICH.    PT Comments    Pt making gradual progress. She was able to take a few steps but limited due to dizziness and posterior lean.  Required facilitation for weight shift, leaning forward, and RW use.  Cued for transfer technique , segmental turns, and focus points to limit dizziness.  Pt required frequent repetition and min-mod A for balance.  Continue to progress as able.  Would likely benefit from chair follow for further gait.     Follow Up Recommendations  CIR     Equipment Recommendations  Other (comment) (defer to CIR)    Recommendations for Other Services Rehab consult     Precautions / Restrictions Precautions Precautions: Fall Precaution Comments: extensor tone, hx of vertigo    Mobility  Bed Mobility Overal bed mobility: Needs Assistance Bed Mobility: Supine to Sit     Supine to sit: Mod assist     General bed mobility comments: Mod A to lift trunk and scoot forward.  Cues for weight shifting, hand placement, and limiting posterior lean.  Transfers Overall transfer level: Needs assistance Equipment used: Rolling walker (2 wheeled) Transfers: Sit to/from [facility] Sit to Stand: Mod assist         General transfer comment: Sit to stand x 5 throughout session and stand pivot x 2.  Required cues for hand placement, knee flexion, and leaning forward.  Mod A to power up with cues to keep eyes focused on a point to limit dizziness.  Ambulation/Gait Ambulation/Gait assistance: Mod assist Gait Distance (Feet): 6 Feet (2',6',4') Assistive device:  Rolling walker (2 wheeled) Gait Pattern/deviations: Step-to pattern;Decreased stride length;Shuffle;Narrow base of support Gait velocity: decreased   General Gait Details: Slow, unsteady, and poor coordination with direction for gait (kept turning and unable to coordinate effort to walk to destination "the door")..  Pt with frequent LOB posteriorly that worsened with fatigue.  Requiring min A initially but regressed to mod/max A for posterior lean after about 5'.  Required assist for RW, weight shifitng, tactile cues for sequence, and verbal cues to widen BOS. Provided 2 seated rest breaks.  Also, cues for segmental turning to limit dizziness.   Stairs             Wheelchair Mobility    Modified Rankin (Stroke Patients Only) Modified Rankin (Stroke Patients Only) Pre-Morbid Rankin Score: Slight disability Modified Rankin: Moderately severe disability     Balance Overall balance assessment: Needs assistance Sitting-balance support: Feet supported;No upper extremity supported Sitting balance-Leahy Scale: Fair Sitting balance - Comments: initially with posterior lean but once positioned EOB able to maintain static balance   Standing balance support: Bilateral upper extremity supported Standing balance-Leahy Scale: Poor Standing balance comment: Requiring RW and min-mod A with one instance of max A due to posterior lean                            Cognition Arousal/Alertness: Awake/alert Behavior During Therapy: WFL for tasks assessed/performed Overall Cognitive Status: Impaired/Different from baseline Area of Impairment: Attention;Memory;Following commands  Orientation Level: Disoriented to;Time Current Attention Level: Sustained Memory: Decreased recall of precautions;Decreased short-term memory Following Commands: Follows one step commands inconsistently Safety/Judgement: Decreased awareness of safety;Decreased awareness of deficits    Problem Solving: Slow processing;Requires verbal cues;Difficulty sequencing;Requires tactile cues General Comments: Pt with R sided inattention. Slow command following. Notable memory deficits.       Exercises      General Comments General comments (skin integrity, edema, etc.): Had c/o dizziness in standing - all VSS.  Cued for focus point and segmental turn.      Pertinent Vitals/Pain Pain Assessment: Faces Faces Pain Scale: Hurts little more Pain Location: head Pain Descriptors / Indicators: Headache Pain Intervention(s): Limited activity within patient's tolerance;Monitored during session;Relaxation    Home Living                      Prior Function            PT Goals (current goals can now be found in the care plan section) Acute Rehab PT Goals Patient Stated Goal: to decrease dizziness.  PT Goal Formulation: With patient Time For Goal Achievement: 08/30/20 Potential to Achieve Goals: Fair Progress towards PT goals: Progressing toward goals    Frequency    Min 4X/week      PT Plan Current plan remains appropriate    Co-evaluation              AM-PAC PT "6 Clicks" Mobility   Outcome Measure  Help needed turning from your back to your side while in a flat bed without using bedrails?: A Little Help needed moving from lying on your back to sitting on the side of a flat bed without using bedrails?: A Lot Help needed moving to and from a bed to a chair (including a wheelchair)?: A Lot Help needed standing up from a chair using your arms (e.g., wheelchair or bedside chair)?: A Lot Help needed to walk in hospital room?: A Lot Help needed climbing 3-5 steps with a railing? : Total 6 Click Score: 12    End of Session Equipment Utilized During Treatment: Gait belt Activity Tolerance: Patient tolerated treatment well Patient left: with nursing/sitter in room;Other (comment) (in transport chair with nurse tech present) Nurse Communication: Mobility  status PT Visit Diagnosis: Other abnormalities of gait and mobility (R26.89);Other symptoms and signs involving the nervous system (R29.898)     Time: 2902-1115 PT Time Calculation (min) (ACUTE ONLY): 30 min  Charges:  $Gait Training: 8-22 mins $Therapeutic Activity: 8-22 mins                     Abran Richard, PT Acute Rehab Services Pager [phone] [facility] [place] 08/27/2020, 4:53 PM
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
## 2020-08-27 NOTE — Progress Notes (Addendum)
Inpatient Rehab Admissions Coordinator:   I have insurance authorization and a bed available for this patient to admit today.  TOC team aware.  Awaiting final approval from Dr. Alfredia Ferguson that pt is ready to admit.    Addendum: I have approval from Dr. Alfredia Ferguson for pt to admit to CIR today. Will let pt/family know.   Shann Medal, PT, DPT Admissions Coordinator [phone] 08/27/20  10:36 AM

## 2020-08-27 NOTE — Progress Notes (Signed)
Pt arrived to floor by chair. Assisted into bed by staff. Pt and family updated on floor and therapy routine and stated understanding. Skin check completed with Tomeka, LPN. Bed alarm on for safety, call bell in reach

## 2020-08-27 NOTE — H&P (Signed)
Physical Medicine and Rehabilitation Admission H&P     HPI: Tara [name] is an 84 year old right-handed female with history of right breast cancer with lumpectomy 2016, TIA 2019 placed on aspirin therapy, hypertension, hypothyroidism,, recurrent UTI, bilateral TKA.  Well-known to rehab services 05/28/2019 to 06/19/2019 for lumbar radiculopathy.  Per chart review patient resides independent living facility in [place] and has the ability to transition to ALF if needed.  She has a daughter in the area.  Presented 08/15/2020 with acute onset of aphasia as well as headache.  Noted blood pressure 226/115.  Cranial CT scan showed acute intraparenchymal hemorrhage centered at the right parieto-occipital region estimated volume 13 cc.  Minimal surrounding edema without significant regional mass-effect.  Underlying age-related cerebral atrophy with mild chronic small vessel ischemic disease.  Initially started on 3% protocol.  MRI follow-up again noting acute left occipital parietal hematoma stable maximal dimension when compared to head CT.  Numerous remote microhemorrhages.  No masslike or concerning vascular enhancement.  Small acute or subacute infarct at the bilateral cerebral cortex.  Admission chemistries potassium 3.1, glucose 115, hemoglobin 14.9, hemoglobin A1c 5.5, urinalysis negative nitrite, SARS coronavirus negative.  EEG negative for seizure.  Echocardiogram with ejection fraction of 65 to 70% no wall motion abnormalities grade 1 diastolic dysfunction.  Latest follow-up cranial CT scan stable left occipital hematoma with mild vasogenic edema..  Tolerating mechanical soft diet.  Therapy evaluations completed and patient was admitted for a comprehensive rehab program.   Pt reports on D3 thin liquid diet- denies constipation- LBM 2 days ago- voiding OK- purewick currently- explained will not have in CIR.  Is very cold ever since latest stroke- is new.   Is a widow- lives alone but son/daughter  and niece/many kin are involved in her care. Lives in 1 floor apartment- in retirement community- independent living.   Review of Systems  Constitutional: Negative for chills and fever.  HENT: Negative for hearing loss.   Eyes: Negative for blurred vision and double vision.  Respiratory: Negative for cough and shortness of breath.   Cardiovascular: Positive for leg swelling. Negative for chest pain and palpitations.  Gastrointestinal: Positive for constipation. Negative for heartburn, nausea and vomiting.  Genitourinary: Negative for dysuria, flank pain and hematuria.  Musculoskeletal: Positive for joint pain and myalgias.  Skin: Negative for rash.  Neurological: Positive for dizziness, speech change and headaches.  Psychiatric/Behavioral: Positive for depression.  All other systems reviewed and are negative.  Past Medical History:  Diagnosis Date  . Arthritis    knees  . Breast cancer ([facility]) 09/30/15   right breast  . Dependent edema   . Depression    just lost spouse in Nov. 2018  . Essential hypertension, benign 04/15/2011  . Herniated lumbar disc without myelopathy   . Hx of staphylococcal infection   . Hypertension   . Hypothyroidism   . Obesity   . Pre-diabetes   . Radicular pain   . Recurrent UTI   . Stroke [facility])    ' mini"  . Thyroid disease    hypothyroidism  . Vertigo   . Wears contact lenses    one   Past Surgical History:  Procedure Laterality Date  . ANKLE SURGERY  1994   RIGHT  . BACK SURGERY    . BREAST LUMPECTOMY WITH NEEDLE LOCALIZATION Right 09/30/2015   Procedure: RIGHT BREAST LUMPECTOMY WITH NEEDLE LOCALIZATION;  Surgeon: Fanny Skates, MD;  Location: [facility];  Service: General;  Laterality: Right;  .  BREAST SURGERY    . COLONOSCOPY W/ BIOPSIES AND POLYPECTOMY    . EYE SURGERY     bilateral  . HAND SURGERY  1951   RIGHT HAND  . JOINT REPLACEMENT     rt shoulder rotator cuff  . KNEE ARTHROSCOPY     left knee  . LUMBAR  LAMINECTOMY/DECOMPRESSION MICRODISCECTOMY Right 01/11/2013   Procedure: LUMBAR LAMINECTOMY/DECOMPRESSION MICRODISCECTOMY 1 LEVEL,Right Lumbar Three-Four;  Surgeon: Elaina Hoops, MD;  Location: [facility];  Service: Neurosurgery;  Laterality: Right;  . ROTATOR CUFF REPAIR  2011   RIGHT  . TONSILLECTOMY    . TOTAL KNEE ARTHROPLASTY     Left  . TOTAL KNEE ARTHROPLASTY Right 12/25/2017   Procedure: TOTAL KNEE ARTHROPLASTY;  Surgeon: Vickey Huger, MD;  Location: [facility];  Service: Orthopedics;  Laterality: Right;   Family History  Problem Relation Age of Onset  . Breast cancer Sister   . Stroke Sister   . Cancer Brother        Kidney  . Heart failure Father   . Uterine cancer Sister   . Breast cancer Sister        Uterine  . Cancer Brother        Lung   Social History:  reports that she has never smoked. She has never used smokeless tobacco. She reports that she does not drink alcohol and does not use drugs. Allergies:  Allergies  Allergen Reactions  . Fentanyl Other (See Comments)    Agitation and confusion  . Other Other (See Comments)    Regarding stronger pain meds- These do not work well with the patient- [place], for the worse = DISORIENTATION  . Ceclor [Cefaclor] Hives and Itching  . Tape Dermatitis and Other (See Comments)    NO Band-Aids = Turns the skin RED!!   Medications Prior to Admission  Medication Sig Dispense Refill  .  stroke: mapping our early stages of recovery book MISC 1 each by Does not apply route once for 1 dose. 1 each 0  . acetaminophen (TYLENOL) 325 MG tablet Take 2 tablets (650 mg total) by mouth every 4 (four) hours as needed for mild pain ((score 1 to 3) or temp > 100.5).    Marland Kitchen amLODipine (NORVASC) 10 MG tablet Take 1 tablet (10 mg total) by mouth daily. (Patient taking differently: Take 5 mg by mouth daily. ) 30 tablet 1  . Cholecalciferol 50 MCG (2000 UT) CAPS Take 1 capsule (2,000 Units total) by mouth daily. 30 capsule 1  .  diazepam (VALIUM) 5 MG tablet Take 1 tablet (5 mg total) by mouth 2 (two) times daily as needed for anxiety.    Marland Kitchen levocetirizine (XYZAL) 5 MG tablet Take 5 mg by mouth daily as needed for allergies.    Marland Kitchen levothyroxine (SYNTHROID) 75 MCG tablet Take 1 tablet (75 mcg total) by mouth daily. 90 tablet 0  . losartan (COZAAR) 50 MG tablet Take 1 tablet (50 mg total) by mouth daily. 30 tablet 0  . meclizine (ANTIVERT) 12.5 MG tablet Take 1 tablet (12.5 mg total) by mouth 3 (three) times daily as needed for dizziness. 30 tablet 0  . Multiple Vitamin (MULTIVITAMIN WITH MINERALS) TABS tablet Take 1 tablet by mouth daily. 30 tablet 0  . ondansetron (ZOFRAN) 4 MG tablet Take 1 tablet (4 mg total) by mouth every 8 (eight) hours as needed for nausea or vomiting. 20 tablet 0  . senna-docusate (SENOKOT-S) 8.6-50 MG tablet Take 1 tablet  by mouth 2 (two) times daily. 30 tablet 0    Drug Regimen Review Drug regimen was reviewed and remains appropriate with no significant issues identified  Home: Home Living Family/patient expects to be discharged to:: Assisted living   Functional History:    Functional Status:  Mobility:          ADL:    Cognition: Cognition Orientation Level: Oriented X4    Physical Exam: Blood pressure (!) 143/65, pulse 76, temperature 98 F (36.7 C), temperature source Oral, resp. rate 17, height 5\' 5"  (1.651 m), weight 72.4 kg, SpO2 93 %. Physical Exam Vitals and nursing note reviewed. Exam conducted with a chaperone present.  Constitutional:      Appearance: Normal appearance.     Comments: Pt sitting up in bed- appropriate, mild expressive aphasia, niece in room, NAD  HENT:     Head: Normocephalic and atraumatic.     Comments: Smile equal- tongue midline    Right Ear: External ear normal.     Left Ear: External ear normal.     Nose: Nose normal. No congestion.     Mouth/Throat:     Mouth: Mucous membranes are dry.     Pharynx: Oropharynx is clear. No  oropharyngeal exudate.  Eyes:     Extraocular Movements: Extraocular movements intact.     Comments: EOMI B/L, but slow looking to right- needed cues- no nystagmus  Cardiovascular:     Rate and Rhythm: Regular rhythm.     Heart sounds: Normal heart sounds.     Comments: RRR; no JVD Pulmonary:     Comments: CTA B/L- no W/R/R- good air movement Abdominal:     Comments: Soft, NT, ND, (+)BS -hypoactive  Genitourinary:    Comments: purewick- medium amber urine-  Musculoskeletal:     Cervical back: Normal range of motion. No rigidity.     Comments: RUE- 4+/5 in biceps, triceps, WE< grip and finger abd LUE 5/5 in same muscles RLE- 4-/5 in HF, KE, DF and PF is 4+/5 LLE- 5-/5 in HF, KE, DF and PF  Per niece wore some kind of foot/leg braces prior to CVA- sounds like B/L AFOs  Skin:    Comments: IV L forearm- looks good- No skin breakdown on bottom/heels  Neurological:     Mental Status: She is oriented to person, place, and time.     Comments: Patient is alert in no acute distress.  Makes eye contact with examiner.  She does have some mild expressive aphasia but was able to have a simple conversation with some mild word finding difficulties.  Follows simple commands. Mild- t slightly more expressive aphasia- could tell was searching for words- came up with good synonyms multiple times Light touch intact in all 4 extremities  Psychiatric:     Comments: appropriate     Results for orders placed or performed during the hospital encounter of 08/15/20 (from the past 48 hour(s))  Basic metabolic panel     Status: Abnormal   Collection Time: 08/26/20  3:38 AM  Result Value Ref Range   Sodium 139 135 - 145 mmol/L   Potassium 3.2 (L) 3.5 - 5.1 mmol/L   Chloride 103 98 - 111 mmol/L   CO2 26 22 - 32 mmol/L   Glucose, Bld 118 (H) 70 - 99 mg/dL    Comment: Glucose reference range applies only to samples taken after fasting for at least 8 hours.   BUN 10 8 - 23 mg/dL   Creatinine, Ser 0.85  0.44 - 1.00 mg/dL   Calcium 8.9 8.9 - 10.3 mg/dL   GFR calc non Af Amer >60 >60 mL/min   Anion gap 10 5 - 15    Comment: Performed at [facility] [address] [place] [address]  CBC     Status: None   Collection Time: 08/26/20  3:38 AM  Result Value Ref Range   WBC 9.9 4.0 - 10.5 K/uL   RBC 3.87 3.87 - 5.11 MIL/uL   Hemoglobin 12.8 12.0 - 15.0 g/dL   HCT 38.3 36 - 46 %   MCV 99.0 80.0 - 100.0 fL   MCH 33.1 26.0 - 34.0 pg   MCHC 33.4 30.0 - 36.0 g/dL   RDW 13.1 11.5 - 15.5 %   Platelets 208 150 - 400 K/uL   nRBC 0.0 0.0 - 0.2 %    Comment: Performed at [facility], [place] [address], [place], [place] [address]  CBC with Differential/Platelet     Status: Abnormal   Collection Time: 08/27/20  4:56 AM  Result Value Ref Range   WBC 10.6 (H) 4.0 - 10.5 K/uL   RBC 3.69 (L) 3.87 - 5.11 MIL/uL   Hemoglobin 12.4 12.0 - 15.0 g/dL   HCT 37.5 36 - 46 %   MCV 101.6 (H) 80.0 - 100.0 fL   MCH 33.6 26.0 - 34.0 pg   MCHC 33.1 30.0 - 36.0 g/dL   RDW 13.0 11.5 - 15.5 %   Platelets 215 150 - 400 K/uL   nRBC 0.0 0.0 - 0.2 %   Neutrophils Relative % 59 %   Neutro Abs 6.3 1.7 - 7.7 K/uL   Lymphocytes Relative 24 %   Lymphs Abs 2.5 0.7 - 4.0 K/uL   Monocytes Relative 11 %   Monocytes Absolute 1.2 (H) 0.1 - 1.0 K/uL   Eosinophils Relative 4 %   Eosinophils Absolute 0.5 0 - 0 K/uL   Basophils Relative 1 %   Basophils Absolute 0.1 0 - 0 K/uL   Immature Granulocytes 1 %   Abs Immature Granulocytes 0.07 0.00 - 0.07 K/uL    Comment: Performed at [facility], [place] [address], [place], [place] [address]  Comprehensive metabolic panel     Status: Abnormal   Collection Time: 08/27/20  4:56 AM  Result Value Ref Range   Sodium 139 135 - 145 mmol/L   Potassium 4.4 3.5 - 5.1 mmol/L   Chloride 103 98 - 111 mmol/L   CO2 29 22 - 32 mmol/L   Glucose, Bld 102 (H) 70 - 99 mg/dL    Comment: Glucose reference range applies only to samples taken after fasting for at least 8 hours.     BUN 12 8 - 23 mg/dL   Creatinine, Ser 0.84 0.44 - 1.00 mg/dL   Calcium 9.1 8.9 - 10.3 mg/dL   Total Protein 5.8 (L) 6.5 - 8.1 g/dL   Albumin 2.7 (L) 3.5 - 5.0 g/dL   AST 50 (H) 15 - 41 U/L   ALT 46 (H) 0 - 44 U/L   Alkaline Phosphatase 59 38 - 126 U/L   Total Bilirubin 0.8 0.3 - 1.2 mg/dL   GFR calc non Af Amer >60 >60 mL/min   Anion gap 7 5 - 15    Comment: Performed at [facility] [address], [place], [place] [address]  Phosphorus     Status: None   Collection Time: 08/27/20  4:56 AM  Result  Value Ref Range   Phosphorus 4.1 2.5 - 4.6 mg/dL    Comment: Performed at [facility] [address], [place], [place] [address]  Magnesium     Status: None   Collection Time: 08/27/20  4:56 AM  Result Value Ref Range   Magnesium 1.9 1.7 - 2.4 mg/dL    Comment: Performed at [facility] [address] [place],  [address]   CT HEAD WO CONTRAST  Result Date: 08/26/2020 CLINICAL DATA:  Known left occipital parenchymal hematoma EXAM: CT HEAD WITHOUT CONTRAST TECHNIQUE: Contiguous axial images were obtained from the base of the skull through the vertex without intravenous contrast. COMPARISON:  08/19/2020 FINDINGS: Brain: Persistent left occipital parenchymal hemorrhage is again seen. The size of the hematoma is overall stable from the prior exam although the density of the blood components peripherally have decreased consistent with evolving hematoma. Some surrounding vasogenic edema is again identified. No new areas of hemorrhage are seen. No focal mass lesion is noted. Vascular: No hyperdense vessel or unexpected calcification. Skull: Normal. Negative for fracture or focal lesion. Sinuses/Orbits: No acute finding. Other: None. IMPRESSION: Overall stable size of the parenchymal hematoma although the density of the peripheral blood components has decreased somewhat in the interval. Persistent surrounding edema is noted without significant change no new focal  abnormality is seen. Electronically Signed   By: Inez Catalina M.D.   On: 08/26/2020 20:41       Medical Problem List and Plan: 1.  Decreased functional mobility with aphasia secondary to left parieto-occipital acute ICH and B/L cerebral infarcts- prior leg braces worn by pt- ?AFO's  -patient may  shower  -ELOS/Goals: 10-14 days- mod I to supervision 2.  Antithrombotics: -DVT/anticoagulation: SCDs-  Due to [place]  -antiplatelet therapy: N/A 3. Pain Management: Tylenol as needed 4. Mood: Valium 5 mg twice daily as needed anxiety  -antipsychotic agents: N/A 5. Neuropsych: This patient is capable of making decisions on her own behalf. 6. Skin/Wound Care: Routine skin checks 7. Fluids/Electrolytes/Nutrition: Routine in and outs with follow-up chemistries 8.  Hypertension.  Cozaar 50 mg daily, Norvasc 10 mg daily.  Monitor with increased mobility 9.  Hypothyroidism.  TSH 1.722.  Continue Synthroid    Nelva Nay, PA-C 08/27/2020   I have personally performed a face to face diagnostic evaluation of this patient and formulated the key components of the plan.  Additionally, I have personally reviewed laboratory data, imaging studies, as well as relevant notes and concur with the physician assistant's documentation above.   The patient's status has not changed from the original H&P.  Any changes in documentation from the acute care chart have been noted above.     [name], MD 08/27/2020

## 2020-08-27 NOTE — Plan of Care (Signed)
  Problem: Consults Goal: RH GENERAL PATIENT EDUCATION Description: See Patient Education module for education specifics. Outcome: Progressing Goal: Skin Care Protocol Initiated - if Braden Score 18 or less Description: If consults are not indicated, leave blank or document N/A Outcome: Progressing Goal: Nutrition Consult-if indicated Outcome: Progressing Goal: Diabetes Guidelines if Diabetic/Glucose > 140 Description: If diabetic or lab glucose is > 140 mg/dl - Initiate Diabetes/Hyperglycemia Guidelines & Document Interventions  Outcome: Progressing   Problem: RH BOWEL ELIMINATION Goal: RH STG MANAGE BOWEL WITH ASSISTANCE Description: STG Manage Bowel with mod Assistance. Outcome: Progressing Goal: RH STG MANAGE BOWEL W/MEDICATION W/ASSISTANCE Description: STG Manage Bowel with Medication with Assistance. Outcome: Progressing   Problem: RH BLADDER ELIMINATION Goal: RH STG MANAGE BLADDER WITH ASSISTANCE Description: STG Manage Bladder With  mod Assistance Outcome: Progressing   Problem: RH SKIN INTEGRITY Goal: RH STG SKIN FREE OF INFECTION/BREAKDOWN Outcome: Progressing Goal: RH STG MAINTAIN SKIN INTEGRITY WITH ASSISTANCE Description: STG Maintain Skin Integrity With Assistance. Outcome: Progressing   Problem: RH SAFETY Goal: RH STG ADHERE TO SAFETY PRECAUTIONS W/ASSISTANCE/DEVICE Description: STG Adhere to Safety Precautions With mod Assistance/Device. Outcome: Progressing Goal: RH STG DECREASED RISK OF FALL WITH ASSISTANCE Description: STG Decreased Risk of Fall With Assistance. Outcome: Progressing   Problem: RH PAIN MANAGEMENT Goal: RH STG PAIN MANAGED AT OR BELOW PT'S PAIN GOAL Outcome: Progressing   Problem: RH KNOWLEDGE DEFICIT GENERAL Goal: RH STG INCREASE KNOWLEDGE OF SELF CARE AFTER HOSPITALIZATION Outcome: Progressing   Problem: RH Pre-functional/Other (Specify) Goal: RH LTG Pre-functional (Specify) Outcome: Progressing Goal: RH LTG Interdisciplinary  (Specify) 1 Description: RH LTG Interdisciplinary (Specify)1 Outcome: Progressing Goal: RH LTG Interdisciplinary (Specify) 2 Description: RH LTG Interdisciplinary (Specify) 2  Outcome: Progressing

## 2020-08-27 NOTE — Discharge Summary (Signed)
Physician Discharge Summary  Tara [name] JJK:[id number] DOB: 06/22/1936 DOA: 08/15/2020  PCP: [facility], [place] date: 08/15/2020 Discharge date: 08/27/2020  Admitted From: Home Disposition: CIR  Recommendations for Outpatient Follow-up:  1. Follow up with PCP in 1-2 weeks 2. Follow up with Neurology within 1-2 weeks 3. Please obtain CMP/CBC, Mag, Phos in one week 4. Please follow up on the following pending results:  Home Health: No  Equipment/Devices: Will defer to CIR    Discharge Condition: Stable CODE STATUS: FULL CODE Diet recommendation: Dysphagia 3 Diet with Thin Fluid Consistency  Brief/Interim Summary: Patient is an 84 year old elderly Caucasian female with a past medical history significant for but not limited to hypertension, hypothyroidism, history of TIA, history of recurrent UTIs as well as a history of a herniated disc last June requiring surgery who presented with aphasia and elevated blood pressure and a headache.  In the ED head CT was done and showed a left parietal occipital intracranial hemorrhage.  The likely etiology of her left parieto-occipital intracerebral hemorrhage was secondary to hypertension versus cerebral amyloid angiopathy.  She is admitted to the ICU and started on Cleviprex drip and hypertonic saline.  Serial head CTs were obtained which were reassuring and her mental status remained stable.  She went under a full neurological work-up and was initiated on a Cleviprex drip which is now titrated off.  Her cerebral edema appeared to be improved and no further hypertonic saline was given.  She was transferred to [facility] service on hospital day 7 on 08/22/2020 and has been relatively stable but yesterday she complained of some dizziness and nausea so I discussed the case with Dr. Erlinda Hong who recommends obtaining a head CT without contrast trying low-dose meclizine.  Head CT showed "Overall stable size of the parenchymal hematoma although the density  of the peripheral blood components has decreased somewhat in the interval. Persistent surrounding edema is noted without significant change no new focal abnormality is seen." Her dizziness and nausea has improved. She is stable to D/C to CIR today and continue Rehabilitative Efforts.   Discharge Diagnoses:  Active Problems:   Hypothyroidism   Hypertension   ICH (intracerebral hemorrhage) (HCC)  Left Parieto-OccipitalIntracerebral Hemorrhage -Etiology likely hypertensive vs cerebral amyloid angiopathy  -Patient admitted to ICU and started on Cleviprex and hypertonic saline.  -Serial CT head obtained, reassuring, mental status stable. -Echo unremarkable. LDL 58 (Statin held). A1c non-diaebtic as it was 5.5.  -EEG without epileptiform discharges but showed generalized and Left Lateral Slowing.  -Aspirin contraindicated given above. -Cleviprex and hypertonic saline now titrated off. Currently on amlodipine. -Continue Amlodipine 10 mg po Daily and Losartan 50 mg po daily; Will stop HCTZ due to dizziness and Hypokalemia -Neurology recommending no antithrombotic given hemorrhage and possible Cerebral Amyloid Angiopathy -Repeat CT Scan of the Head done and showed "Overall stable size of the parenchymal hematoma although the density of the peripheral blood components has decreased somewhat in the interval. Persistent surrounding edema is noted without significant change no new focal abnormality is seen."   Dizziness and Nausea -Patient states it is chronic but has been slightly worsened -Start Meclizine 12.5 mg po TIDprn and c/w Antiemetics and add IV Ondansetron; Improved  -Discussed with Neuro Dr. Erlinda Hong who recommends repeating a Head CT Scan w/o Contrast; Repeat Head CT as above -May need Vestibular Testing but will defer to CIR to do  -Will check Orthostatic VS and she was slightly orthostatic going from sitting to standing as BP dropped  from 138/75 to 110/63 -Order TED Hose  -Will  discontinue HCTZ for now   Cerebral edema and therapy-inducedhypernatremia -Exam appears improving, no further hypertonic saline recommended by Neuro -Was started on 3% and now off -Na+ is now 139 again today  -Continue to Monitor   Hypernatremia  -Resolved -Allow Na to auto-correct and has now normalized   Hypertension -Was Admitted and started on Cleviprex and now weaned off -Blood pressure improving and last BP was 145/64 -Continue Amlodipine 10 mg po Daily, Losartan 50 mg po daily; HCTZ discontinued due to Dizziness -C/w Hydralazine 10 mg q6hprn SBP <160 and Labetalol 10-40 mg q2hprn SBP <160   Hypothyroidism -Continue Levothyroxine 75 mcg po Daily -Check TSH in the AM   Hypokalemia -Patient's K+ this AM was 3.2 and likely dropped in the setting of HCTZ -Replete with po KCl 40 mEQ BID x2 and improved and K+ is now 4.4 -Continue to Monitor and Replete as Necessary -Repeat CMP   Hyperglycemia -CBG's ranging from 104-125 on Daily BMP's -Likely reactive as recent HbA1c was 5.5 on 08/15/20 -Continue to Monitor and Trend -If Necessary will need to place on sensitive Novolog SSI AC  Abnormal LFT's -Mild with an AST 50 and ALT was 46 -Recommend obtaining a RUQ U/S at CIR and an Acute Hepatitis Panel -Continue to Monitor and Trend and Repeat Hepatic Fxn Panel at CIR -Continue to Hold Statin until LFTs improve -Repeat CMP in the AM   GOC: DNR, poA  Discharge Instructions  Discharge Instructions    Ambulatory referral to Neurology   Complete by: As directed    Follow up with Dr. Krista Blue at [facility] in 4-6 weeks. Pt is Dr. Rhea Belton pt. Thanks.   Call MD for:  difficulty breathing, headache or visual disturbances   Complete by: As directed    Call MD for:  extreme fatigue   Complete by: As directed    Call MD for:  hives   Complete by: As directed    Call MD for:  persistant dizziness or light-headedness   Complete by: As directed    Call MD for:  persistant nausea and  vomiting   Complete by: As directed    Call MD for:  redness, tenderness, or signs of infection (pain, swelling, redness, odor or green/yellow discharge around incision site)   Complete by: As directed    Call MD for:  severe uncontrolled pain   Complete by: As directed    Call MD for:  temperature >100.4   Complete by: As directed    Diet - low sodium heart healthy   Complete by: As directed    Discharge instructions   Complete by: As directed    You were cared for by a hospitalist during your hospital stay. If you have any questions about your discharge medications or the care you received while you were in the hospital after you are discharged, you can call the unit and ask to speak with the hospitalist on call if the hospitalist that took care of you is not available. Once you are discharged, your primary care physician will handle any further medical issues. Please note that NO REFILLS for any discharge medications will be authorized once you are discharged, as it is imperative that you return to your primary care physician (or establish a relationship with a primary care physician if you do not have one) for your aftercare needs so that they can reassess your need for medications and monitor your lab values.  Follow  up with PCP and Dr. Krista Blue at [facility] in 1-2 weeks. Take all medications as prescribed. If symptoms change or worsen please return to the ED for evaluation   Increase activity slowly   Complete by: As directed    No wound care   Complete by: As directed      Allergies as of 08/27/2020      Reactions   Fentanyl Other (See Comments)   Agitation and confusion   Other Other (See Comments)   Regarding stronger pain meds- These do not work well with the patient- [place], for the worse = DISORIENTATION   Ceclor [cefaclor] Hives, Itching   Tape Dermatitis, Other (See Comments)   NO Band-Aids = Turns the skin RED!!      Medication List    STOP taking  these medications   aspirin 81 MG EC tablet   atorvastatin 40 MG tablet Commonly known as: LIPITOR     TAKE these medications    stroke: mapping our early stages of recovery book Misc 1 each by Does not apply route once for 1 dose.   acetaminophen 325 MG tablet Commonly known as: TYLENOL Take 2 tablets (650 mg total) by mouth every 4 (four) hours as needed for mild pain ((score 1 to 3) or temp > 100.5).   amLODipine 10 MG tablet Commonly known as: NORVASC Take 1 tablet (10 mg total) by mouth daily. What changed: how much to take   Cholecalciferol 50 MCG (2000 UT) Caps Take 1 capsule (2,000 Units total) by mouth daily.   diazepam 5 MG tablet Commonly known as: VALIUM Take 1 tablet (5 mg total) by mouth 2 (two) times daily as needed for anxiety.   levocetirizine 5 MG tablet Commonly known as: XYZAL Take 5 mg by mouth daily as needed for allergies.   levothyroxine 75 MCG tablet Commonly known as: SYNTHROID Take 1 tablet (75 mcg total) by mouth daily.   losartan 50 MG tablet Commonly known as: COZAAR Take 1 tablet (50 mg total) by mouth daily. What changed:   medication strength  how much to take   meclizine 12.5 MG tablet Commonly known as: ANTIVERT Take 1 tablet (12.5 mg total) by mouth 3 (three) times daily as needed for dizziness.   multivitamin with minerals Tabs tablet Take 1 tablet by mouth daily.   ondansetron 4 MG tablet Commonly known as: ZOFRAN Take 1 tablet (4 mg total) by mouth every 8 (eight) hours as needed for nausea or vomiting.   senna-docusate 8.6-50 MG tablet Commonly known as: Senokot-S Take 1 tablet by mouth 2 (two) times daily.       Follow-up Information    Marcial Pacas, MD. Schedule an appointment as soon as possible for a visit in 4 week(s).   Specialty: Neurology Contact information: [place] [id number] [phone]              Allergies  Allergen Reactions  . Fentanyl Other (See Comments)     Agitation and confusion  . Other Other (See Comments)    Regarding stronger pain meds- These do not work well with the patient- [place], for the worse = DISORIENTATION  . Ceclor [Cefaclor] Hives and Itching  . Tape Dermatitis and Other (See Comments)    NO Band-Aids = Turns the skin RED!!   Consultations:  Neurology  Procedures/Studies: CT HEAD WO CONTRAST  Result Date: 08/26/2020 CLINICAL DATA:  Known left occipital parenchymal hematoma EXAM:  CT HEAD WITHOUT CONTRAST TECHNIQUE: Contiguous axial images were obtained from the base of the skull through the vertex without intravenous contrast. COMPARISON:  08/19/2020 FINDINGS: Brain: Persistent left occipital parenchymal hemorrhage is again seen. The size of the hematoma is overall stable from the prior exam although the density of the blood components peripherally have decreased consistent with evolving hematoma. Some surrounding vasogenic edema is again identified. No new areas of hemorrhage are seen. No focal mass lesion is noted. Vascular: No hyperdense vessel or unexpected calcification. Skull: Normal. Negative for fracture or focal lesion. Sinuses/Orbits: No acute finding. Other: None. IMPRESSION: Overall stable size of the parenchymal hematoma although the density of the peripheral blood components has decreased somewhat in the interval. Persistent surrounding edema is noted without significant change no new focal abnormality is seen. Electronically Signed   By: Inez Catalina M.D.   On: 08/26/2020 20:41   CT HEAD WO CONTRAST  Result Date: 08/19/2020 CLINICAL DATA:  Stroke follow-up EXAM: CT HEAD WITHOUT CONTRAST TECHNIQUE: Contiguous axial images were obtained from the base of the skull through the vertex without intravenous contrast. COMPARISON:  Two days ago FINDINGS: Brain: Unchanged size and shape of the left occipital hematoma which measures up to 4 x 4 x 3.3 cm. There may be local subarachnoid extension, non progressed.  The adjacent edema and sulcal effacement is unchanged. No interval hemorrhage, hydrocephalus, or infarct. Vascular: No hyperdense vessel or unexpected calcification. Skull: Normal. Negative for fracture or focal lesion. Sinuses/Orbits: No acute finding. IMPRESSION: Size stable left occipital hematoma with vasogenic edema. Electronically Signed   By: Monte Fantasia M.D.   On: 08/19/2020 07:13   CT HEAD WO CONTRAST  Result Date: 08/17/2020 CLINICAL DATA:  84 year old female presenting on 08/15/2020 with left parietal lobe hemorrhage. EXAM: CT HEAD WITHOUT CONTRAST TECHNIQUE: Contiguous axial images were obtained from the base of the skull through the vertex without intravenous contrast. COMPARISON:  Head CT and brain MRI 08/16/2020 and earlier. FINDINGS: Brain: Heterogeneous posterior left hemisphere intra-axial hemorrhage centered along the left parieto-occipital sulcus encompasses 46 x 37 x 46 mm (AP by transverse by CC) for an estimated blood volume of 39 mL, stable since yesterday when measured using the same technique. Patchy surrounding edema appears stable. Stable regional mass effect since yesterday. No extension into the ventricular system, although trace subarachnoid space extension is suspected (series 3, image 24). Stable gray-white matter differentiation elsewhere. No superimposed acute cortically based infarct. No new area of intracranial bleeding. No ventriculomegaly. Basilar cisterns remain patent. Vascular: Calcified atherosclerosis at the skull base. Skull: Hyperostosis.  No acute osseous abnormality identified. Sinuses/Orbits: Visualized paranasal sinuses and mastoids are stable and well pneumatized. Other: A mild broad-based right vertex scalp hematoma has developed since 08/15/2020. Otherwise stable visible orbit and scalp soft tissues. IMPRESSION: 1. Stable intra-axial hemorrhage since yesterday, centered at the left parieto-occipital sulcus. Estimated blood volume of 39 mL (slight  differences in measurement technique from yesterday). Stable mild regional edema and mass effect. 2. Trace associated subarachnoid hemorrhage suspected, but no intraventricular extension or ventriculomegaly. 3. Possible new mild right vertex scalp hematoma. No skull fracture. Electronically Signed   By: Genevie Ann M.D.   On: 08/17/2020 11:58   CT HEAD WO CONTRAST  Result Date: 08/16/2020 CLINICAL DATA:  Follow-up examination for intracranial hemorrhage. EXAM: CT HEAD WITHOUT CONTRAST TECHNIQUE: Contiguous axial images were obtained from the base of the skull through the vertex without intravenous contrast. COMPARISON:  Prior CT from 01/16/2020. FINDINGS: Brain: Acute intraparenchymal hemorrhage  centered at the left parieto-occipital region has expanded and is increased in size from previous, now measuring 3.3 x 4.0 x 4.3 cm (estimated volume 28 cc). Surrounding vasogenic edema has increased, with increased mass effect on the adjacent left lateral ventricle. No appreciable intraventricular extension of blood. No significant left-to-right midline shift. Small volume adjacent subarachnoid hemorrhage noted. No appreciable extra-axial extension. No other new intracranial hemorrhage. No acute large vessel territory infarct elsewhere within the brain. No hydrocephalus or extra-axial fluid collection. Vascular: No hyperdense vessel. Skull: Stable without new or acute abnormality. Sinuses/Orbits: Globes and orbital soft tissues demonstrate no acute finding. Mild ethmoidal and maxillary sinus mucosal thickening. Mastoid air cells remain clear. Other: None. IMPRESSION: 1. Interval increase in size of acute intraparenchymal hemorrhage centered at the left parieto-occipital region, now measuring 3.3 x 4.0 x 4.3 cm (estimated volume 28 CC). Surrounding vasogenic edema has increased, with increased mass effect on the adjacent left lateral ventricle. No significant left-to-right midline shift. 2. No other new acute intracranial  abnormality. Electronically Signed   By: Jeannine Boga M.D.   On: 08/16/2020 23:51   MR BRAIN W WO CONTRAST  Result Date: 08/16/2020 CLINICAL DATA:  Headache with intracranial hemorrhage suspected EXAM: MRI HEAD WITHOUT AND WITH CONTRAST TECHNIQUE: Multiplanar, multiecho pulse sequences of the brain and surrounding structures were obtained without and with intravenous contrast. CONTRAST:  19mL GADAVIST GADOBUTROL 1 MMOL/ML IV SOLN COMPARISON:  CT from yesterday FINDINGS: Brain: Acute hematoma in the left occipital lobe with rim of adjacent edema. Maximal dimensions on T2 weighted imaging are 3 cm, similar to before although there could be some greater lobulation towards the cortex posteriorly and superiorly. Mild chronic white matter disease. On gradient imaging there are numerous remote lobar and bilateral cerebellar microhemorrhages with relative sparing of the deep gray nuclei. No mass or clear vascular malformation seen at the level of the hemorrhage. On sagittal postcontrast imaging there are a few visible vessels but no dilated vessel or tangle seen on axial slices. Small acute or subacute cortical infarct along the posterior right frontal and left frontal parietal cortex Vascular: Major vessels are enhancing, including the superior sagittal sinus dominant left cerebral veins Skull and upper cervical spine: Normal marrow signal Sinuses/Orbits: Bilateral cataract resection IMPRESSION: 1. Acute left occipital parietal hematoma. Stable maximal dimension when compared to head CT earlier this admission, although there may be greater dissection of blood along the superior and posterior aspect of the hematoma. 2. Numerous remote micro hemorrhages, presumed amyloid angiopathy. No masslike or concerning vascular enhancement underlying the hemorrhage. 3. Small acute or subacute infarcts at the bilateral cerebral cortex. Electronically Signed   By: Monte Fantasia M.D.   On: 08/16/2020 11:57   DG Chest Port 1  View  Result Date: 08/16/2020 CLINICAL DATA:  Code stroke EXAM: PORTABLE CHEST 1 VIEW COMPARISON:  01/09/2013 FINDINGS: Cardiac enlargement. No vascular congestion, edema, or consolidation. No pleural effusions. No pneumothorax. Mediastinal contours appear intact. IMPRESSION: No active disease. Electronically Signed   By: Lucienne Capers M.D.   On: 08/16/2020 00:00   DG Abd Portable 1V  Result Date: 08/16/2020 CLINICAL DATA:  Abdominal distension.  Code stroke. EXAM: PORTABLE ABDOMEN - 1 VIEW COMPARISON:  None. FINDINGS: Gas and stool in the colon noted. No dilated bowel loops are present. No suspicious calcifications are present. Prior lumbar fusion changes are noted. IMPRESSION: No acute abnormality. Electronically Signed   By: Margarette Canada M.D.   On: 08/16/2020 07:55   DG Swallowing Func-Speech Pathology  Result Date: 08/19/2020 Objective Swallowing Evaluation: Type of Study: MBS-Modified Barium Swallow Study  Patient Details Name: LEGACIE DILLINGHAM MRN: [id number] Date of Birth: 12-Aug-1936 Today's Date: 08/19/2020 Time: SLP Start Time (ACUTE ONLY): 1156 -SLP Stop Time (ACUTE ONLY): 1218 SLP Time Calculation (min) (ACUTE ONLY): 22 min Past Medical History: Past Medical History: Diagnosis Date . Arthritis   knees . Breast cancer ([place]) 09/30/15  right breast . Dependent edema  . Depression   just lost spouse in Nov. 2018 . Essential hypertension, benign 04/15/2011 . Herniated lumbar disc without myelopathy  . Hx of staphylococcal infection  . Hypertension  . Hypothyroidism  . Obesity  . Pre-diabetes  . Radicular pain  . Recurrent UTI  . Stroke [facility])   ' mini" . Thyroid disease   hypothyroidism . Vertigo  . Wears contact lenses   one Past Surgical History: Past Surgical History: Procedure Laterality Date . ANKLE SURGERY  1994  RIGHT . BACK SURGERY   . BREAST LUMPECTOMY WITH NEEDLE LOCALIZATION Right 09/30/2015  Procedure: RIGHT BREAST LUMPECTOMY WITH NEEDLE LOCALIZATION;  Surgeon: Fanny Skates, MD;  Location:  [place];  Service: General;  Laterality: Right; . BREAST SURGERY   . COLONOSCOPY W/ BIOPSIES AND POLYPECTOMY   . EYE SURGERY    bilateral . HAND SURGERY  1951  RIGHT HAND . JOINT REPLACEMENT    rt shoulder rotator cuff . KNEE ARTHROSCOPY    left knee . LUMBAR LAMINECTOMY/DECOMPRESSION MICRODISCECTOMY Right 01/11/2013  Procedure: LUMBAR LAMINECTOMY/DECOMPRESSION MICRODISCECTOMY 1 LEVEL,Right Lumbar Three-Four;  Surgeon: Elaina Hoops, MD;  Location: [place];  Service: Neurosurgery;  Laterality: Right; . ROTATOR CUFF REPAIR  2011  RIGHT . TONSILLECTOMY   . TOTAL KNEE ARTHROPLASTY    Left . TOTAL KNEE ARTHROPLASTY Right 12/25/2017  Procedure: TOTAL KNEE ARTHROPLASTY;  Surgeon: Vickey Huger, MD;  Location: [place];  Service: Orthopedics;  Laterality: Right; HPI: Patient is an 84 y.o. female resident at independent living facility with PMH: right breast CA, HTN, hypothyroidism, pre-diabetes, recurrent UTI, prior CVA, depression, arthritis, who presented to ED via EMS as code stroke. In ED, she exhibited receptive and expressive aphasia but able to communicate enough to state that she had a severe headache. CT revealed left parieto-occipital ICH.  Subjective: drowsy but arouses well, particular about what she will eat/drink Assessment / Plan / Recommendation CHL IP CLINICAL IMPRESSIONS 08/19/2020 Clinical Impression Pt has an oral more than pharyngeal dysphagia, impacted in part by cognitive-linguistic status. She has reduced lingual control orally, allowing boluses to spill back to the pharynx haphazardly instead of more controlled. Thin liquids pool in her pyriform sinuses until she has cleared the rest of the liquid from her mouth. With larger sips this can fill her pyriform sinuses completely, but she still maintains good airway protection until she takes quite large, consecutive boluses via straw. Aspiration occurred x1 with a reflexive soft cough that brought the aspirate back out of her trachea but  not out of her laryngeal vestibule. Pt had limited bolus acceptance with solids during this study and masticated the barium tablet even though she was cued multiple times not to do so. Recommend starting with Dys 3 diet and thin liquids. Suspect that adequate pacing and positioning will be most integral for safe PO intake.  SLP Visit Diagnosis Dysphagia, oral phase (R13.11) Attention and concentration deficit following -- Frontal lobe and executive function deficit following -- Impact on safety and function Mild aspiration risk;Moderate aspiration risk   CHL IP TREATMENT RECOMMENDATION  08/19/2020 Treatment Recommendations Therapy as outlined in treatment plan below   Prognosis 08/19/2020 Prognosis for Safe Diet Advancement Good Barriers to Reach Goals Cognitive deficits;Language deficits Barriers/Prognosis Comment -- CHL IP DIET RECOMMENDATION 08/19/2020 SLP Diet Recommendations Dysphagia 3 (Mech soft) solids;Thin liquid Liquid Administration via Cup;Straw Medication Administration Whole meds with puree Compensations Minimize environmental distractions;Slow rate;Small sips/bites Postural Changes Seated upright at 90 degrees;Remain semi-upright after after feeds/meals (Comment)   CHL IP OTHER RECOMMENDATIONS 08/19/2020 Recommended Consults -- Oral Care Recommendations Oral care BID Other Recommendations --   CHL IP FOLLOW UP RECOMMENDATIONS 08/19/2020 Follow up Recommendations Inpatient Rehab   CHL IP FREQUENCY AND DURATION 08/19/2020 Speech Therapy Frequency (ACUTE ONLY) min 2x/week Treatment Duration 2 weeks      CHL IP ORAL PHASE 08/19/2020 Oral Phase Impaired Oral - Pudding Teaspoon -- Oral - Pudding Cup -- Oral - Honey Teaspoon -- Oral - Honey Cup -- Oral - Nectar Teaspoon -- Oral - Nectar Cup -- Oral - Nectar Straw -- Oral - Thin Teaspoon -- Oral - Thin Cup Decreased bolus cohesion;Premature spillage Oral - Thin Straw Decreased bolus cohesion;Premature spillage Oral - Puree Delayed oral transit Oral - Mech Soft  Delayed oral transit Oral - Regular -- Oral - Multi-Consistency -- Oral - Pill Other (Comment) Oral Phase - Comment --  CHL IP PHARYNGEAL PHASE 08/19/2020 Pharyngeal Phase Impaired Pharyngeal- Pudding Teaspoon -- Pharyngeal -- Pharyngeal- Pudding Cup -- Pharyngeal -- Pharyngeal- Honey Teaspoon -- Pharyngeal -- Pharyngeal- Honey Cup -- Pharyngeal -- Pharyngeal- Nectar Teaspoon -- Pharyngeal -- Pharyngeal- Nectar Cup -- Pharyngeal -- Pharyngeal- Nectar Straw -- Pharyngeal -- Pharyngeal- Thin Teaspoon -- Pharyngeal -- Pharyngeal- Thin Cup WFL Pharyngeal -- Pharyngeal- Thin Straw Penetration/Aspiration during swallow Pharyngeal Material enters airway, passes BELOW cords and not ejected out despite cough attempt by patient Pharyngeal- Puree WFL Pharyngeal -- Pharyngeal- Mechanical Soft WFL Pharyngeal -- Pharyngeal- Regular -- Pharyngeal -- Pharyngeal- Multi-consistency -- Pharyngeal -- Pharyngeal- Pill -- Pharyngeal -- Pharyngeal Comment --  CHL IP CERVICAL ESOPHAGEAL PHASE 08/19/2020 Cervical Esophageal Phase WFL Pudding Teaspoon -- Pudding Cup -- Honey Teaspoon -- Honey Cup -- Nectar Teaspoon -- Nectar Cup -- Nectar Straw -- Thin Teaspoon -- Thin Cup -- Thin Straw -- Puree -- Mechanical Soft -- Regular -- Multi-consistency -- Pill -- Cervical Esophageal Comment -- [name], M.A. [facility] Pager [phone] Office [phone] 08/19/2020, 2:51 PM              EEG adult  Result Date: 08/17/2020 Lora Havens, MD     08/17/2020 11:01 AM Patient Name: BEENA CATANO MRN: [id number] Epilepsy Attending: Lora Havens Referring Physician/Provider: Burnetta Sabin, NP Date: 08/17/2020 Duration: Patient history: 84 year old female with left parieto-occipital hemorrhage.  EEG to evaluate for seizures. Level of alertness: comatose/lethargic AEDs during EEG study: None Technical aspects: This EEG study was done with scalp electrodes positioned according to the 10-20 International system of  electrode placement. Electrical activity was acquired at a sampling rate of 500Hz  and reviewed with a high frequency filter of 70Hz  and a low frequency filter of 1Hz . EEG data were recorded continuously and digitally stored. Description: EEG showed continuous generalized and lateralized left hemisphere 3 to 6 Hz theta-delta slowing. Hyperventilation and photic stimulation were not performed.   ABNORMALITY -Continuous slow, generalized and lateralized left hemisphere IMPRESSION: This study is suggestive of cortical dysfunction left hemisphere likely secondary to underlying hemorrhage as well as severe diffuse encephalopathy, nonspecific etiology. No seizures or epileptiform discharges were seen throughout the  recording. Lora Havens   ECHOCARDIOGRAM COMPLETE  Result Date: 08/18/2020    ECHOCARDIOGRAM REPORT   Patient Name:   LATEKA RADY Date of Exam: 08/18/2020 Medical Rec #:  [id number]        Height:       63.0 in Accession #:    [id number]       Weight:       162.0 lb Date of Birth:  October 06, 1936        BSA:          1.768 m Patient Age:    18 years         BP:           144/50 mmHg Patient Gender: F                HR:           82 bpm. Exam Location:  Inpatient Procedure: 2D Echo, Cardiac Doppler and Color Doppler Indications:    Stroke 434.91 / I163.9  History:        Patient has prior history of Echocardiogram examinations, most                 recent 06/14/2018. Stroke; Risk Factors:Hypertension.  Sonographer:    Bernadene Person RDCS Referring Phys: [id number] SHARON L BIBY  Sonographer Comments: Technically difficult study due to poor echo windows. Image acquisition challenging due to uncooperative patient. patient constantly moving throughout study IMPRESSIONS  1. Left ventricular ejection fraction, by estimation, is 65 to 70%. The left ventricle has normal function. The left ventricle has no regional wall motion abnormalities. There is mild left ventricular hypertrophy. Left ventricular diastolic  parameters are consistent with Grade I diastolic dysfunction (impaired relaxation).  2. Right ventricular systolic function is normal. The right ventricular size is normal. There is normal pulmonary artery systolic pressure.  3. The mitral valve is abnormal. Trivial mitral valve regurgitation.  4. The aortic valve is tricuspid. Aortic valve regurgitation is trivial. Mild aortic valve sclerosis is present, with no evidence of aortic valve stenosis.  5. The inferior vena cava is normal in size with greater than 50% respiratory variability, suggesting right atrial pressure of 3 mmHg. FINDINGS  Left Ventricle: Left ventricular ejection fraction, by estimation, is 65 to 70%. The left ventricle has normal function. The left ventricle has no regional wall motion abnormalities. The left ventricular internal cavity size was normal in size. There is  mild left ventricular hypertrophy. Left ventricular diastolic parameters are consistent with Grade I diastolic dysfunction (impaired relaxation). Indeterminate filling pressures. Right Ventricle: The right ventricular size is normal. No increase in right ventricular wall thickness. Right ventricular systolic function is normal. There is normal pulmonary artery systolic pressure. The tricuspid regurgitant velocity is 2.78 m/s, and  with an assumed right atrial pressure of 3 mmHg, the estimated right ventricular systolic pressure is 27.7 mmHg. Left Atrium: Left atrial size was normal in size. Right Atrium: Right atrial size was normal in size. Pericardium: There is no evidence of pericardial effusion. Mitral Valve: The mitral valve is abnormal. Mild mitral annular calcification. Trivial mitral valve regurgitation. Tricuspid Valve: The tricuspid valve is grossly normal. Tricuspid valve regurgitation is not demonstrated. Aortic Valve: The aortic valve is tricuspid. Aortic valve regurgitation is trivial. Aortic regurgitation PHT measures 376 msec. Mild aortic valve sclerosis is  present, with no evidence of aortic valve stenosis. Pulmonic Valve: The pulmonic valve was normal in structure. Pulmonic valve regurgitation is not visualized. Aorta: The aortic  root and ascending aorta are structurally normal, with no evidence of dilitation. Venous: The inferior vena cava is normal in size with greater than 50% respiratory variability, suggesting right atrial pressure of 3 mmHg. IAS/Shunts: The interatrial septum was not well visualized.  LEFT VENTRICLE PLAX 2D LVIDd:         4.20 cm  Diastology LVIDs:         2.40 cm  LV e' medial:    9.25 cm/s LV PW:         1.10 cm  LV E/e' medial:  10.4 LV IVS:        1.00 cm  LV e' lateral:   10.30 cm/s LVOT diam:     1.70 cm  LV E/e' lateral: 9.3 LV SV:         75 LV SV Index:   42 LVOT Area:     2.27 cm  RIGHT VENTRICLE RV S prime:     15.70 cm/s TAPSE (M-mode): 3.0 cm LEFT ATRIUM             Index       RIGHT ATRIUM           Index LA diam:        3.40 cm 1.92 cm/m  RA Area:     12.60 cm LA Vol (A2C):   39.3 ml 22.23 ml/m RA Volume:   24.70 ml  13.97 ml/m LA Vol (A4C):   48.2 ml 27.26 ml/m LA Biplane Vol: 46.1 ml 26.07 ml/m  AORTIC VALVE LVOT Vmax:   154.00 cm/s LVOT Vmean:  103.000 cm/s LVOT VTI:    0.329 m AI PHT:      376 msec  AORTA Ao Root diam: 3.20 cm Ao Asc diam:  3.20 cm MITRAL VALVE               TRICUSPID VALVE MV Area (PHT): 3.33 cm    TR Peak grad:   30.9 mmHg MV Decel Time: 228 msec    TR Vmax:        278.00 cm/s MV E velocity: 96.30 cm/s MV A velocity: 98.50 cm/s  SHUNTS MV E/A ratio:  0.98        Systemic VTI:  0.33 m                            Systemic Diam: 1.70 cm Lyman Bishop MD Electronically signed by Lyman Bishop MD Signature Date/Time: 08/18/2020/11:38:06 AM    Final    CT HEAD CODE STROKE WO CONTRAST  Result Date: 08/15/2020 CLINICAL DATA:  Code stroke. Initial evaluation for acute expressive aphasia. EXAM: CT HEAD WITHOUT CONTRAST TECHNIQUE: Contiguous axial images were obtained from the base of the skull through the  vertex without intravenous contrast. COMPARISON:  Previous MRI from 06/15/2018. FINDINGS: Brain: Generalized age-related cerebral atrophy with mild chronic small vessel ischemic disease. There is an acute intraparenchymal hemorrhage centered at the right parieto-occipital region measuring 3.0 x 2.8 x 3.0 cm (estimated volume 13 cc). Minimal surrounding edema without significant regional mass effect at this time. No intraventricular for subdural extension. No other acute intracranial hemorrhage. Gray-white matter differentiation otherwise maintained. No other large vessel territory infarct. No mass lesion, midline shift, or hydrocephalus. No extra-axial fluid collection. Vascular: No hyperdense vessel. Scattered vascular calcifications noted within the carotid siphons. Skull: Scalp soft tissues within normal limits. No evidence for acute traumatic injury. Calvarium intact. Sinuses/Orbits: Globes and orbital soft tissues demonstrate  no acute finding. Mild scattered mucoperiosteal thickening noted within the ethmoidal air cells. Paranasal sinuses are otherwise clear. No mastoid effusion. Other: None. ASPECTS [facility] Stroke Program Early CT Score) Positive intracranial hemorrhage, does not apply. IMPRESSION: 1. Acute intraparenchymal hemorrhage centered at the right parieto-occipital region, estimated volume 13 cc. Minimal surrounding edema without significant regional mass effect. 2. Underlying age-related cerebral atrophy with mild chronic small vessel ischemic disease. These results were communicated to Dr. Cheral Marker at 11:23 pmon 9/25/2021by text page via the [facility] messaging system. Electronically Signed   By: Jeannine Boga M.D.   On: 08/15/2020 23:26    Subjective: Seen and examined at bedside and she is doing much better today and did not complain of any nausea or vomiting.  States her dizziness is much improved and she is tolerating her breakfast.  Denies any other concerns or complaints at this time and  ready to go to rehab.   Discharge Exam: Vitals:   08/27/20 0348 08/27/20 0807  BP: 131/60 (!) 145/64  Pulse: (!) 57 62  Resp: 18 18  Temp: 98 F (36.7 C) 98 F (36.7 C)  SpO2: 96% 97%   Vitals:   08/26/20 1954 08/26/20 2329 08/27/20 0348 08/27/20 0807  BP: 132/61 (!) 131/57 131/60 (!) 145/64  Pulse: 63 66 (!) 57 62  Resp: 17 17 18 18   Temp: 98 F (36.7 C) 97.6 F (36.4 C) 98 F (36.7 C) 98 F (36.7 C)  TempSrc: Oral Oral  Oral  SpO2: 95% 96% 96% 97%  Weight:       General: Pt is alert, awake, not in acute distress Cardiovascular: RRR, S1/S2 +, no rubs, no gallops Respiratory: Mildly diminished bilaterally, no wheezing, no rhonchi Abdominal: Soft, NT, ND, bowel sounds + Extremities: no edema, no cyanosis  The results of significant diagnostics from this hospitalization (including imaging, microbiology, ancillary and laboratory) are listed below for reference.    Microbiology: No results found for this or any previous visit (from the past 240 hour(s)).   Labs: BNP (last 3 results) No results for input(s): BNP in the last 8760 hours. Basic Metabolic Panel: Recent Labs  Lab 08/23/20 0040 08/24/20 0234 08/25/20 0142 08/26/20 0338 08/27/20 0456  NA 142 141 140 139 139  K 3.1* 3.7 4.6 3.2* 4.4  CL 109 105 104 103 103  CO2 25 28 26 26 29   GLUCOSE 105* 111* 103* 118* 102*  BUN 8 12 13 10 12   CREATININE 0.61 0.92 0.81 0.85 0.84  CALCIUM 8.5* 8.7* 8.9 8.9 9.1  MG  --   --   --   --  1.9  PHOS  --   --   --   --  4.1   Liver Function Tests: Recent Labs  Lab 08/27/20 0456  AST 50*  ALT 46*  ALKPHOS 59  BILITOT 0.8  PROT 5.8*  ALBUMIN 2.7*   No results for input(s): LIPASE, AMYLASE in the last 168 hours. No results for input(s): AMMONIA in the last 168 hours. CBC: Recent Labs  Lab 08/23/20 0040 08/24/20 0234 08/25/20 0142 08/26/20 0338 08/27/20 0456  WBC 10.0 9.9 9.7 9.9 10.6*  NEUTROABS  --   --   --   --  6.3  HGB 13.8 12.8 12.8 12.8 12.4  HCT  40.5 38.7 38.3 38.3 37.5  MCV 98.3 97.7 99.2 99.0 101.6*  PLT 171 186 213 208 215   Cardiac Enzymes: No results for input(s): CKTOTAL, CKMB, CKMBINDEX, TROPONINI in the last 168 hours. BNP:  Invalid input(s): POCBNP CBG: No results for input(s): GLUCAP in the last 168 hours. D-Dimer No results for input(s): DDIMER in the last 72 hours. Hgb A1c No results for input(s): HGBA1C in the last 72 hours. Lipid Profile No results for input(s): CHOL, HDL, LDLCALC, TRIG, CHOLHDL, LDLDIRECT in the last 72 hours. Thyroid function studies No results for input(s): TSH, T4TOTAL, T3FREE, THYROIDAB in the last 72 hours.  Invalid input(s): FREET3 Anemia work up No results for input(s): VITAMINB12, FOLATE, FERRITIN, TIBC, IRON, RETICCTPCT in the last 72 hours. Urinalysis    Component Value Date/Time   COLORURINE YELLOW 08/17/2020 1441   APPEARANCEUR CLEAR 08/17/2020 1441   LABSPEC 1.013 08/17/2020 1441   PHURINE 6.0 08/17/2020 1441   GLUCOSEU NEGATIVE 08/17/2020 1441   HGBUR SMALL (A) 08/17/2020 1441   BILIRUBINUR NEGATIVE 08/17/2020 1441   BILIRUBINUR NEG 10/19/2017 1447   KETONESUR 5 (A) 08/17/2020 1441   PROTEINUR NEGATIVE 08/17/2020 1441   UROBILINOGEN 0.2 10/19/2017 1447   UROBILINOGEN 0.2 06/09/2011 1431   NITRITE NEGATIVE 08/17/2020 1441   LEUKOCYTESUR NEGATIVE 08/17/2020 1441   Sepsis Labs Invalid input(s): PROCALCITONIN,  WBC,  LACTICIDVEN Microbiology No results found for this or any previous visit (from the past 240 hour(s)).  Time coordinating discharge: 35 minutes  SIGNED:  Kerney Elbe, DO [facility] 08/27/2020, 11:00 AM Pager is on [place]  If 7PM-7AM, please contact night-coverage [email or web address]

## 2020-08-28 ENCOUNTER — Inpatient Hospital Stay (HOSPITAL_COMMUNITY): Payer: Medicare Other | Admitting: Occupational Therapy

## 2020-08-28 ENCOUNTER — Inpatient Hospital Stay (HOSPITAL_COMMUNITY): Payer: Medicare Other

## 2020-08-28 ENCOUNTER — Inpatient Hospital Stay (HOSPITAL_COMMUNITY): Payer: Medicare Other | Admitting: Speech Pathology

## 2020-08-28 LAB — CBC WITH DIFFERENTIAL/PLATELET
Abs Immature Granulocytes: 0.04 10*3/uL (ref 0.00–0.07)
Basophils Absolute: 0.1 10*3/uL (ref 0.0–0.1)
Basophils Relative: 1 %
Eosinophils Absolute: 0.4 10*3/uL (ref 0.0–0.5)
Eosinophils Relative: 3 %
HCT: 39.5 % (ref 36.0–46.0)
Hemoglobin: 12.8 g/dL (ref 12.0–15.0)
Immature Granulocytes: 0 %
Lymphocytes Relative: 26 %
Lymphs Abs: 2.9 10*3/uL (ref 0.7–4.0)
MCH: 32.7 pg (ref 26.0–34.0)
MCHC: 32.4 g/dL (ref 30.0–36.0)
MCV: 100.8 fL — ABNORMAL HIGH (ref 80.0–100.0)
Monocytes Absolute: 0.9 10*3/uL (ref 0.1–1.0)
Monocytes Relative: 8 %
Neutro Abs: 6.9 10*3/uL (ref 1.7–7.7)
Neutrophils Relative %: 62 %
Platelets: 224 10*3/uL (ref 150–400)
RBC: 3.92 MIL/uL (ref 3.87–5.11)
RDW: 13 % (ref 11.5–15.5)
WBC: 11.2 10*3/uL — ABNORMAL HIGH (ref 4.0–10.5)
nRBC: 0 % (ref 0.0–0.2)

## 2020-08-28 LAB — COMPREHENSIVE METABOLIC PANEL
ALT: 42 U/L (ref 0–44)
AST: 42 U/L — ABNORMAL HIGH (ref 15–41)
Albumin: 2.8 g/dL — ABNORMAL LOW (ref 3.5–5.0)
Alkaline Phosphatase: 65 U/L (ref 38–126)
Anion gap: 9 (ref 5–15)
BUN: 10 mg/dL (ref 8–23)
CO2: 29 mmol/L (ref 22–32)
Calcium: 9.3 mg/dL (ref 8.9–10.3)
Chloride: 102 mmol/L (ref 98–111)
Creatinine, Ser: 0.77 mg/dL (ref 0.44–1.00)
GFR calc non Af Amer: >60 mL/min (ref >60)
Glucose, Bld: 108 mg/dL — ABNORMAL HIGH (ref 70–99)
Potassium: 3.6 mmol/L (ref 3.5–5.1)
Sodium: 140 mmol/L (ref 135–145)
Total Bilirubin: 1 mg/dL (ref 0.3–1.2)
Total Protein: 6 g/dL — ABNORMAL LOW (ref 6.5–8.1)

## 2020-08-28 MED ORDER — LORATADINE 10 MG PO TABS
10.0000 mg | ORAL_TABLET | Freq: Every day | ORAL | Status: DC
  Administered 2020-08-28 – 2020-09-15 (×14): 10 mg via ORAL
  Filled 2020-08-28 (×18): qty 1

## 2020-08-28 NOTE — Progress Notes (Signed)
[place] Individual Statement of Services  Patient Name:  [place]  Date:  08/28/2020  Welcome to the [facility].  Our goal is to provide you with an individualized program based on your diagnosis and situation, designed to meet your specific needs.  With this comprehensive rehabilitation program, you will be expected to participate in at least 3 hours of rehabilitation therapies Monday-Friday, with modified therapy programming on the weekends.  Your rehabilitation program will include the following services:  Physical Therapy (PT), Occupational Therapy (OT), Speech Therapy (ST), 24 hour per day rehabilitation nursing, Therapeutic Recreaction (TR), Neuropsychology, Care Coordinator, Rehabilitation Medicine, Nutrition Services, Pharmacy Services and Other  Weekly team conferences will be held on Wednesday to discuss your progress.  Your Inpatient Rehabilitation Care Coordinator will talk with you frequently to get your input and to update you on team discussions.  Team conferences with you and your family in attendance may also be held.  Expected length of stay: 15-19 Days  Overall anticipated outcome: Supervision to Min A  Depending on your progress and recovery, your program may change. Your Inpatient Rehabilitation Care Coordinator will coordinate services and will keep you informed of any changes. Your Inpatient Rehabilitation Care Coordinator's name and contact numbers are listed  below.  The following services may also be recommended but are not provided by the [place]:    [place] will be made to provide these services after discharge if needed.  Arrangements include referral to agencies that provide these services.  Your insurance has been verified to be:  [employer] Your primary doctor is:  [facility] [employer]  Pertinent  information will be shared with your doctor and your insurance company.  Inpatient Rehabilitation Care Coordinator:  Erlene Quan, [place] or [phone]  Information discussed with and copy given to patient by: [name], 08/28/2020, 10:59 AM

## 2020-08-28 NOTE — Plan of Care (Signed)
  Problem: RH BOWEL ELIMINATION Goal: RH STG MANAGE BOWEL WITH ASSISTANCE Description: STG Manage Bowel with mod Assistance. 08/28/2020 1238 by Mikki Harbor, RN Outcome: Progressing 08/28/2020 1238 by Mikki Harbor, RN Outcome: Progressing Goal: RH STG MANAGE BOWEL W/MEDICATION W/ASSISTANCE Description: STG Manage Bowel with Medication with Assistance. 08/28/2020 1238 by Mikki Harbor, RN Outcome: Progressing 08/28/2020 1238 by Mikki Harbor, RN Outcome: Progressing   Problem: RH BLADDER ELIMINATION Goal: RH STG MANAGE BLADDER WITH ASSISTANCE Description: STG Manage Bladder With  mod Assistance 08/28/2020 1238 by Mikki Harbor, RN Outcome: Progressing 08/28/2020 1238 by Mikki Harbor, RN Outcome: Progressing

## 2020-08-28 NOTE — Plan of Care (Signed)
  Problem: RH BOWEL ELIMINATION Goal: RH STG MANAGE BOWEL WITH ASSISTANCE Description: STG Manage Bowel with mod Assistance. Outcome: Progressing Goal: RH STG MANAGE BOWEL W/MEDICATION W/ASSISTANCE Description: STG Manage Bowel with Medication with Assistance. Outcome: Progressing   Problem: RH BLADDER ELIMINATION Goal: RH STG MANAGE BLADDER WITH ASSISTANCE Description: STG Manage Bladder With  mod Assistance Outcome: Progressing

## 2020-08-28 NOTE — Progress Notes (Signed)
Inpatient Rehabilitation  Patient information reviewed and entered into eRehab system by Kailen Hinkle M. Jeremias Broyhill, M.A., CCC/SLP, PPS Coordinator.  Information including medical coding, functional ability and quality indicators will be reviewed and updated through discharge.    

## 2020-08-28 NOTE — Evaluation (Signed)
Speech Language Pathology Assessment and Plan  Patient Details  Name: BRYLIE SNEATH MRN: [id number] Date of Birth: 15-Dec-1935  SLP Diagnosis: Aphasia;Cognitive Impairments;Speech and Language deficits;Dysphagia  Rehab Potential: Good ELOS: 2 weeks    Today's Date: 08/29/2020 SLP Individual Time:11:05  -  12:05     Hospital Problem: Principal Problem:   ICH (intracerebral hemorrhage) ([facility])  Past Medical History:  Past Medical History:  Diagnosis Date  . Arthritis    knees  . Breast cancer ([facility]) 09/30/15   right breast  . Dependent edema   . Depression    just lost spouse in Nov. 2018  . Essential hypertension, benign 04/15/2011  . Herniated lumbar disc without myelopathy   . Hx of staphylococcal infection   . Hypertension   . Hypothyroidism   . Obesity   . Pre-diabetes   . Radicular pain   . Recurrent UTI   . Stroke [facility])    ' mini"  . Thyroid disease    hypothyroidism  . Vertigo   . Wears contact lenses    one   Past Surgical History:  Past Surgical History:  Procedure Laterality Date  . ANKLE SURGERY  1994   RIGHT  . BACK SURGERY    . BREAST LUMPECTOMY WITH NEEDLE LOCALIZATION Right 09/30/2015   Procedure: RIGHT BREAST LUMPECTOMY WITH NEEDLE LOCALIZATION;  Surgeon: Fanny Skates, MD;  Location: [facility];  Service: General;  Laterality: Right;  . BREAST SURGERY    . COLONOSCOPY W/ BIOPSIES AND POLYPECTOMY    . EYE SURGERY     bilateral  . HAND SURGERY  1951   RIGHT HAND  . JOINT REPLACEMENT     rt shoulder rotator cuff  . KNEE ARTHROSCOPY     left knee  . LUMBAR LAMINECTOMY/DECOMPRESSION MICRODISCECTOMY Right 01/11/2013   Procedure: LUMBAR LAMINECTOMY/DECOMPRESSION MICRODISCECTOMY 1 LEVEL,Right Lumbar Three-Four;  Surgeon: Elaina Hoops, MD;  Location: [place];  Service: Neurosurgery;  Laterality: Right;  . ROTATOR CUFF REPAIR  2011   RIGHT  . TONSILLECTOMY    . TOTAL KNEE ARTHROPLASTY     Left  . TOTAL KNEE ARTHROPLASTY Right  12/25/2017   Procedure: TOTAL KNEE ARTHROPLASTY;  Surgeon: Vickey Huger, MD;  Location: [place];  Service: Orthopedics;  Laterality: Right;    Assessment / Plan / Recommendation Patient is a 84 y.o. year old female with recent admission to the hospital on 08/15/2020 with acute onset of aphasia as well as headache. Noted blood pressure 226/115. Cranial CT scan showed acute intraparenchymal hemorrhage centered at the right parieto-occipital region estimated volume 13 cc. Minimal surrounding edema without significant regional mass-effect. Underlying age-related cerebral atrophy with mild chronic small vessel ischemic disease. Initially started on 3% protocol. MRI follow-up again noting acute left occipital parietal hematoma stable maximal dimension when compared to head CT. Numerous remote microhemorrhages. No masslike or concerning vascular enhancement. Small acute or subacute infarct at the bilateral cerebral cortex.  Patient transferred to CIR on 08/27/2020 .   Clinical Impression Patient presents with a mild expressive aphasia, moderate cognitive impairmente and mild primarily oral dysphagia. She exhibits anomia and intermittent phonemic paraphasias at conversational level but expressive language is fluent, mild errors in repetition. Reasoning for hypothetical problems is adequate as is abstract reasoning, however concerns regarding safety secondary to cognitive impairments consisting of orientation and memory storage and retrieval impairment During unstructured conversation, she is very circumlocutious without awareness, repeating same information and requiring cues to interrupt in order to proceed with conversation.  Topic maintainence is poor overall without cues. She is oriented to self, basic situation and place but not time. Currently, she lives in an [place] facility but likely will need ALF secondary to current cognitive deficits warranting 24/7 supervision  Skilled Therapeutic  Interventions          Bedside swallow evaluation, cognitive-linguistic evaluation  SLP Assessment  Patient will need skilled Speech Lanaguage Pathology Services during CIR admission    Recommendations  SLP Diet Recommendations: Dysphagia 3 (Mech soft);Thin Liquid Administration via: Straw;Cup Medication Administration: Whole meds with liquid Supervision: Patient able to self feed;Intermittent supervision to cue for compensatory strategies Compensations: Minimize environmental distractions;Slow rate;Small sips/bites Postural Changes and/or Swallow Maneuvers: Seated upright 90 degrees Oral Care Recommendations: Oral care BID Recommendations for Other Services: Neuropsych consult Patient destination: Assisted Living Follow up Recommendations: Home Health SLP;Outpatient SLP;24 hour supervision/assistance Equipment Recommended: None recommended by SLP    SLP Frequency 3 to 5 out of 7 days   SLP Duration  SLP Intensity  SLP Treatment/Interventions 2 weeks    Minumum of 1-2 x/day, 30 to 90 minutes  Cognitive remediation/compensation;Cueing hierarchy;Functional tasks;Internal/external aids;Medication managment;Multimodal communication approach;Environmental controls;Dysphagia/aspiration precaution training;Speech/Language facilitation;Patient/family education    Pain Pain Assessment Pain Scale: 0-10 Pain Score: 0-No pain  Prior Functioning Cognitive/Linguistic Baseline: Within functional limits Type of Home: Independent living facility  Lives With: Alone Available Help at Discharge: Family;Available PRN/intermittently Vocation: Retired  SLP Evaluation Cognition Overall Cognitive Status: Impaired/Different from baseline Arousal/Alertness: Awake/alert Orientation Level: Oriented to person;Oriented to place;Disoriented to time;Oriented to situation Attention: Sustained Focused Attention: Appears intact Sustained Attention: Impaired Sustained Attention Impairment: Verbal  basic;Functional basic Memory: Impaired Memory Impairment: Retrieval deficit;Storage deficit Immediate Memory Recall: Sock;Blue;Bed Memory Recall Sock: Not able to recall Memory Recall Blue: Without Cue Memory Recall Bed: Not able to recall Awareness: Impaired Awareness Impairment: Intellectual impairment Problem Solving: Impaired Problem Solving Impairment: Functional basic;Verbal basic;Verbal complex Executive Function: Self Monitoring Sequencing: Impaired Self Monitoring: Impaired Self Monitoring Impairment: Verbal basic;Verbal complex Behaviors: Perseveration Safety/Judgment: Impaired Comments: Pt with severe right visual field deficit, requiring max instructional cueing to scan right of midline during selfcare session and during functional mobility with use of the RW.  Pt hitting the doorway with the walker, but unaware.  Comprehension Auditory Comprehension Overall Auditory Comprehension: Appears within functional limits for tasks assessed Expression Expression Primary Mode of Expression: Verbal Verbal Expression Overall Verbal Expression: Impaired Initiation: No impairment Level of Generative/Spontaneous Verbalization: Sentence;Conversation;Phrase Repetition: Impaired Level of Impairment: Sentence level Naming: Impairment Responsive: 76-100% accurate Confrontation: Within functional limits Verbal Errors: Perseveration;Not aware of errors;Phonemic paraphasias Pragmatics: Impairment Impairments: Topic maintenance;Interpretation of nonverbal communication Interfering Components: Attention Effective Techniques: Open ended questions;Semantic cues Non-Verbal Means of Communication: Not applicable Written Expression Dominant Hand: Right Written Expression: Not tested Oral Motor Oral Motor/Sensory Function Overall Oral Motor/Sensory Function: Mild impairment Facial ROM: Reduced right Facial Symmetry: Abnormal symmetry right Mandible: Within Functional Limits Motor  Speech Overall Motor Speech: Appears within functional limits for tasks assessed Respiration: Within functional limits Phonation: Normal Resonance: Within functional limits Articulation: Within functional limitis Intelligibility: Intelligible Word: 75-100% accurate Phrase: 75-100% accurate Motor Planning: Witnin functional limits Motor Speech Errors: Not applicable  Care Tool Care Tool Cognition Expression of Ideas and Wants Expression of Ideas and Wants: Some difficulty - exhibits some difficulty with expressing needs and ideas (e.g, some words or finishing thoughts) or speech is not clear   Understanding Verbal and Non-Verbal Content Understanding Verbal and Non-Verbal Content: Usually understands - understands most conversations,  but misses some part/intent of message. Requires cues at times to understand   Memory/Recall Ability *first 3 days only Memory/Recall Ability *first 3 days only: Staff names and faces;That he or she is in a hospital/hospital unit     PMSV Assessment  PMSV Trial Intelligibility: Intelligible Word: 75-100% accurate Phrase: 75-100% accurate  Bedside Swallowing Assessment General Date of Onset: 08/15/20 Previous Swallow Assessment: BSE Diet Prior to this Study: Dysphagia 3 (soft);Thin liquids Temperature Spikes Noted: No Respiratory Status: Room air History of Recent Intubation: No Behavior/Cognition: Alert;Cooperative;Pleasant mood Oral Cavity - Dentition: Adequate natural dentition Self-Feeding Abilities: Able to feed self Patient Positioning: Upright in bed Baseline Vocal Quality: Normal Volitional Cough: Strong Volitional Swallow: Able to elicit  Oral Care Assessment   Ice Chips   Thin Liquid Thin Liquid: Within functional limits Presentation: Self Fed;Straw Nectar Thick   Honey Thick   Puree Puree: Within functional limits Solid Solid: Impaired Oral Phase Functional Implications: Prolonged oral transit BSE Assessment Risk for  Aspiration Impact on safety and function: Mild aspiration risk Other Related Risk Factors: Cognitive impairment  Short Term Goals: Week 1: SLP Short Term Goal 1 (Week 1): Patient will tolerate regular texture solids, thin liquids with mod I for swallow safety SLP Short Term Goal 2 (Week 1): Patient will maintain topic during structured conversation with modA SLP Short Term Goal 3 (Week 1): Patient will use external aids for orientation to time, situation and recall of recent events SLP Short Term Goal 4 (Week 1): Patient will demonstrate awareness to deficits during functional tasks with minA SLP Short Term Goal 5 (Week 1): Patient will perform ADL's safely with minA for safety and accuracy.  Refer to Care Plan for Long Term Goals  Recommendations for other services: Neuropsych  Discharge Criteria: Patient will be discharged from SLP if patient refuses treatment 3 consecutive times without medical reason, if treatment goals not met, if there is a change in medical status, if patient makes no progress towards goals or if patient is discharged from hospital.  The above assessment, treatment plan, treatment alternatives and goals were discussed and mutually agreed upon: by patient and by family   Sonia Baller, MA, CCC-SLP Speech Therapy=

## 2020-08-28 NOTE — Evaluation (Signed)
Occupational Therapy Assessment and Plan  Patient Details  Name: Tara [name] MRN: [id number] Date of Birth: September 11, 1936  OT Diagnosis: abnormal posture, cognitive deficits, disturbance of vision and muscle weakness (generalized) Rehab Potential: Rehab Potential (ACUTE ONLY): Good ELOS: 12-14 days   Today's Date: 08/28/2020 OT Individual Time: 1300-1410 OT Individual Time Calculation (min): 70 min     Hospital Problem: Principal Problem:   ICH (intracerebral hemorrhage) ([place])   Past Medical History:  Past Medical History:  Diagnosis Date  . Arthritis    knees  . Breast cancer ([place]) 09/30/15   right breast  . Dependent edema   . Depression    just lost spouse in Nov. 2018  . Essential hypertension, benign 04/15/2011  . Herniated lumbar disc without myelopathy   . Hx of staphylococcal infection   . Hypertension   . Hypothyroidism   . Obesity   . Pre-diabetes   . Radicular pain   . Recurrent UTI   . Stroke [facility])    ' mini"  . Thyroid disease    hypothyroidism  . Vertigo   . Wears contact lenses    one   Past Surgical History:  Past Surgical History:  Procedure Laterality Date  . ANKLE SURGERY  1994   RIGHT  . BACK SURGERY    . BREAST LUMPECTOMY WITH NEEDLE LOCALIZATION Right 09/30/2015   Procedure: RIGHT BREAST LUMPECTOMY WITH NEEDLE LOCALIZATION;  Surgeon: Fanny Skates, MD;  Location: [place];  Service: General;  Laterality: Right;  . BREAST SURGERY    . COLONOSCOPY W/ BIOPSIES AND POLYPECTOMY    . EYE SURGERY     bilateral  . HAND SURGERY  1951   RIGHT HAND  . JOINT REPLACEMENT     rt shoulder rotator cuff  . KNEE ARTHROSCOPY     left knee  . LUMBAR LAMINECTOMY/DECOMPRESSION MICRODISCECTOMY Right 01/11/2013   Procedure: LUMBAR LAMINECTOMY/DECOMPRESSION MICRODISCECTOMY 1 LEVEL,Right Lumbar Three-Four;  Surgeon: Elaina Hoops, MD;  Location: [place] [facility];  Service: Neurosurgery;  Laterality: Right;  . ROTATOR CUFF REPAIR  2011   RIGHT  .  TONSILLECTOMY    . TOTAL KNEE ARTHROPLASTY     Left  . TOTAL KNEE ARTHROPLASTY Right 12/25/2017   Procedure: TOTAL KNEE ARTHROPLASTY;  Surgeon: Vickey Huger, MD;  Location: [place];  Service: Orthopedics;  Laterality: Right;    Assessment & Plan Clinical Impression: Patient is a 84 y.o. year old female with recent admission to the hospital on 08/15/2020 with acute onset of aphasia as well as headache.  Noted blood pressure 226/115.  Cranial CT scan showed acute intraparenchymal hemorrhage centered at the right parieto-occipital region estimated volume 13 cc.  Minimal surrounding edema without significant regional mass-effect.  Underlying age-related cerebral atrophy with mild chronic small vessel ischemic disease.  Initially started on 3% protocol.  MRI follow-up again noting acute left occipital parietal hematoma stable maximal dimension when compared to head CT.  Numerous remote microhemorrhages.  No masslike or concerning vascular enhancement.  Small acute or subacute infarct at the bilateral cerebral cortex.  Patient transferred to CIR on 08/27/2020 .    Patient currently requires mod with basic self-care skills secondary to muscle weakness, field cut and decreased awareness, decreased problem solving, decreased safety awareness and decreased memory.  Prior to hospitalization, patient could complete ADLs with modified independent .  Patient will benefit from skilled intervention to decrease level of assist with basic self-care skills and increase independence with basic self-care skills prior to discharge  to ALF with 24 hr assist/supervision.  Anticipate patient will require 24 hour supervision and follow up home health.  OT - End of Session Activity Tolerance: Tolerates 10 - 20 min activity with multiple rests Endurance Deficit: Yes OT Assessment Rehab Potential (ACUTE ONLY): Good OT Patient demonstrates impairments in the following area(s): Balance;Cognition;Safety;Vision OT Basic ADL's  Functional Problem(s): Grooming;Bathing;Dressing;Toileting OT Advanced ADL's Functional Problem(s): Simple Meal Preparation OT Transfers Functional Problem(s): Tub/Shower;Toilet OT Additional Impairment(s): None OT Plan OT Intensity: Minimum of 1-2 x/day, 45 to 90 minutes OT Frequency: 5 out of 7 days OT Duration/Estimated Length of Stay: 12-14 days OT Treatment/Interventions: Balance/vestibular training;Discharge planning;Functional electrical stimulation;Self Care/advanced ADL retraining;Therapeutic Activities;UE/LE Coordination activities;Functional mobility training;Patient/family education;Therapeutic Exercise;Cognitive remediation/compensation;Community reintegration;DME/adaptive equipment instruction;Neuromuscular re-education;UE/LE Strength taining/ROM;Visual/perceptual remediation/compensation;Disease mangement/prevention;Pain management OT Self Feeding Anticipated Outcome(s): modified indepedent OT Basic Self-Care Anticipated Outcome(s): supervision OT Toileting Anticipated Outcome(s): supervision OT Bathroom Transfers Anticipated Outcome(s): supervision OT Recommendation Recommendations for Other Services: Neuropsych consult;Therapeutic Recreation consult Therapeutic Recreation Interventions: Stress management Patient destination: Assisted Living Follow Up Recommendations: 24 hour supervision/assistance;Home health OT Equipment Recommended: To be determined   OT Evaluation Precautions/Restrictions  Precautions Precautions: Fall Precaution Comments: history of vertigo Restrictions Weight Bearing Restrictions: No General   Vital Signs Therapy Vitals Temp: 97.6 F (36.4 C) Temp Source: Oral Pulse Rate: 69 Resp: 17 BP: (!) 131/51 Patient Position (if appropriate): Lying Oxygen Therapy SpO2: 99 % O2 Device: Room Air Pain Pain Assessment Pain Scale: Faces Pain Score: 0-No pain Home Living/Prior Functioning Home Living Family/patient expects to be discharged to::  Assisted living Living Arrangements: Alone Available Help at Discharge: Family, Available PRN/intermittently Type of Home: Independent living facility (can transition to ALF according to chart) Home Access: Level entry Home Layout: One level Bathroom Shower/Tub: Multimedia programmer: Handicapped height Bathroom Accessibility: Yes  Lives With: Alone IADL History Homemaking Responsibilities: Yes Meal Prep Responsibility: Primary 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 Prior Function Level of Independence: Requires assistive device for independence, Independent with transfers, Independent with homemaking with ambulation, Independent with basic ADLs  Able to [place]?: No Driving: No Vocation: Retired Comments: ambulates with use of RW Vision Baseline Vision/History: No visual deficits Patient Visual Report:  (dizziness) Vision Assessment?: Yes Eye Alignment: Within Functional Limits Ocular Range of Motion: Within Functional Limits Alignment/Gaze Preference: Within Defined Limits Tracking/Visual Pursuits: Decreased smoothness of horizontal tracking;Decreased smoothness of vertical tracking;Other (comment);Requires cues, head turns, or add eye shifts to track (lost fixation when scanning to the right) Convergence: Impaired - to be further tested in functional context Visual Fields: Right visual field deficit;Right homonymous hemianopsia Additional Comments: Pt with no report of diplopia but demonstrates inconsistency with reporting the number of fingers that the therapist is holding up as well as reaching to the right of a target when presented to her. Perception  Perception: Within Functional Limits Praxis Praxis: Intact Praxis Impairment Details: Motor planning Cognition Overall Cognitive Status: Impaired/Different from baseline Arousal/Alertness: Awake/alert Orientation Level: Person;Place;Situation Person: Oriented Place:  Disoriented Situation: Oriented Year: Other (Comment) (1961) Month: November Day of Week: Incorrect Memory: Impaired Memory Impairment: Storage deficit Immediate Memory Recall: Sock;Blue;Bed Memory Recall Sock: Not able to recall Memory Recall Blue: Without Cue Memory Recall Bed: Not able to recall Attention: Sustained Focused Attention: Appears intact Sustained Attention: Impaired Sustained Attention Impairment: Verbal basic;Functional basic Awareness: Impaired Awareness Impairment: Intellectual impairment;Anticipatory impairment;Emergent impairment Problem Solving: Impaired Problem Solving Impairment: Verbal basic;Functional basic Executive Function: Sequencing Sequencing: Impaired Safety/Judgment: Impaired Comments: Pt with severe right  visual field deficit, requiring max instructional cueing to scan right of midline during selfcare session and during functional mobility with use of the RW.  Pt hitting the doorway with the walker, but unaware. Sensation Sensation Light Touch: Appears Intact Proprioception: Appears Intact Stereognosis: Appears Intact Coordination Gross Motor Movements are Fluid and Coordinated: Yes Fine Motor Movements are Fluid and Coordinated: Yes Coordination and Movement Description: FM and gross motor coordination WFLs in BUEs Motor  Motor Motor: Abnormal postural alignment and control  Trunk/Postural Assessment  Cervical Assessment Cervical Assessment: Exceptions to [facility] (cervical flexion and cervical protraction) Thoracic Assessment Thoracic Assessment: Exceptions to [facility] (thoracic kyphosis) Lumbar Assessment Lumbar Assessment: Exceptions to [facility] (lumbar flexion with posterior pelvic tilt)  Balance Balance Balance Assessed: Yes Static Sitting Balance Static Sitting - Balance Support: Feet supported Static Sitting - Level of Assistance: 5: Stand by assistance Dynamic Sitting Balance Dynamic Sitting - Balance Support: During functional  activity Dynamic Sitting - Level of Assistance: 5: Stand by assistance Static Standing Balance Static Standing - Balance Support: During functional activity Static Standing - Level of Assistance: 4: Min assist Dynamic Standing Balance Dynamic Standing - Balance Support: During functional activity Dynamic Standing - Level of Assistance: 3: Mod assist Extremity/Trunk Assessment RUE Assessment RUE Assessment: Within Functional Limits General Strength Comments: strength 4/5 throughout LUE Assessment LUE Assessment: Within Functional Limits General Strength Comments: strength 4/5  Care Tool Care Tool Self Care Eating        Oral Care    Oral Care Assist Level: Set up assist    Bathing   Body parts bathed by patient: Right arm;Left arm;Chest;Abdomen;Front perineal area;Buttocks;Right upper leg;Left upper leg;Face Body parts bathed by helper: Left lower leg;Right lower leg   Assist Level: Minimal Assistance - Patient > 75%    Upper Body Dressing(including orthotics)   What is the patient wearing?: Pull over shirt   Assist Level: Set up assist    Lower Body Dressing (excluding footwear)   What is the patient wearing?: Incontinence brief;Pants Assist for lower body dressing: Moderate Assistance - Patient 50 - 74%    Putting on/Taking off footwear   What is the patient wearing?: Non-skid slipper socks Assist for footwear: Dependent - Patient 0%       Care Tool Toileting Toileting activity   Assist for toileting: Moderate Assistance - Patient 50 - 74%     Care Tool Bed Mobility Roll left and right activity        Sit to lying activity   Sit to lying assist level: Contact Guard/Touching assist    Lying to sitting edge of bed activity   Lying to sitting edge of bed assist level: Contact Guard/Touching assist     Care Tool Transfers Sit to stand transfer   Sit to stand assist level: Moderate Assistance - Patient 50 - 74%    Chair/bed transfer   Chair/bed transfer  assist level: Moderate Assistance - Patient 50 - 74%     Toilet transfer   Assist Level: Moderate Assistance - Patient 50 - 74%     Care Tool Cognition Expression of Ideas and Wants Expression of Ideas and Wants: Some difficulty - exhibits some difficulty with expressing needs and ideas (e.g, some words or finishing thoughts) or speech is not clear   Understanding Verbal and Non-Verbal Content Understanding Verbal and Non-Verbal Content: Usually understands - understands most conversations, but misses some part/intent of message. Requires cues at times to understand   Memory/Recall Ability *first 3 days only  Refer to Care Plan for Long Term Goals  SHORT TERM GOAL WEEK 1 OT Short Term Goal 1 (Week 1): Pt will complete LB bathing sit to stand with min guard assist for two consecutive sessions. OT Short Term Goal 2 (Week 1): Pt will complete LB dressing with min assist sit to stand and AE PRN. OT Short Term Goal 3 (Week 1): Pt will complete toilet transfer with min guard assist using the RW for support. OT Short Term Goal 4 (Week 1): Pt will scan right of midline with no more than min instructional cueing to avoid obstacles on the right when ambulating with the RW to the bathroom.  Recommendations for other services: Therapeutic Recreation  Stress management   Skilled Therapeutic Intervention ADL ADL Eating: Set up Where Assessed-Eating: Wheelchair Grooming: Supervision/safety Where Assessed-Grooming: Wheelchair;Sitting at sink Upper Body Bathing: Supervision/safety Where Assessed-Upper Body Bathing: Sitting at sink;Wheelchair Lower Body Bathing: Minimal assistance Where Assessed-Lower Body Bathing: Wheelchair;Sitting at sink;Standing at sink Upper Body Dressing: Supervision/safety Where Assessed-Upper Body Dressing: Wheelchair;Sitting at sink Lower Body Dressing: Moderate assistance Where Assessed-Lower Body Dressing: Wheelchair;Sitting at sink;Standing at sink Toileting:  Moderate assistance Where Assessed-Toileting: Glass blower/designer: Moderate assistance Toilet Transfer Method: Counselling psychologist: Raised toilet seat;Grab bars Mobility  Transfers Sit to Stand: Moderate Assistance - Patient 50-74% Stand to Sit: Moderate Assistance - Patient 50-74%  Pt pleasant and cooperative.  Demonstrated some moderate word finding issues with conversation during therapy as well as decreased ability to stay on topic and answer questions directly that therapist asked.  She was unable to state place without choices given and was also unable to state deficits from the CVA.  She also exhibited decreased cognitive processing at times such as when telling her to back up to the chair to sit, she instead started walking forward away from it, with therapist having to give her a demonstrational cue to back up.  She demonstrated severe right hemianopsia, not being able to see the washcloth down in the wash pan in front of her and slightly right of midline.  She exhibited decreased ability to reach her feet for selfcare tasks overall, and may benefit from AE.  Unsure of how she did this before, but she did report having a LH brush in the shower that she could use to wash her feet.  Increased difficulty noted with ambulation to the bathroom as she would run the RW into the doorway on the right and was not able to determine what was happening.  She needed mod assist to get the RW off of the doorway in order to complete the transfer to the toilet.  Pt's daughter in at end of session.  Discussed expectations of pt needing 24 hour supervision at discharge secondary to cognitive deficits and visual deficits.   Discharge Criteria: Patient will be discharged from OT if patient refuses treatment 3 consecutive times without medical reason, if treatment goals not met, if there is a change in medical status, if patient makes no progress towards goals or if patient is discharged from  hospital.  The above assessment, treatment plan, treatment alternatives and goals were discussed and mutually agreed upon: by patient  Kamariah Fruchter OTR/L 08/28/2020, 4:58 PM
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
## 2020-08-28 NOTE — Progress Notes (Signed)
Patient Details  Name: Tara [name] MRN: [id number] Date of Birth: 17-Feb-1936  Today's Date: 08/28/2020  Hospital Problems: Principal Problem:   ICH (intracerebral hemorrhage) [facility])  Past Medical History:  Past Medical History:  Diagnosis Date  . Arthritis    knees  . Breast cancer ([place]) 09/30/15   right breast  . Dependent edema   . Depression    just lost spouse in Nov. 2018  . Essential hypertension, benign 04/15/2011  . Herniated lumbar disc without myelopathy   . Hx of staphylococcal infection   . Hypertension   . Hypothyroidism   . Obesity   . Pre-diabetes   . Radicular pain   . Recurrent UTI   . Stroke [facility])    ' mini"  . Thyroid disease    hypothyroidism  . Vertigo   . Wears contact lenses    one   Past Surgical History:  Past Surgical History:  Procedure Laterality Date  . ANKLE SURGERY  1994   RIGHT  . BACK SURGERY    . BREAST LUMPECTOMY WITH NEEDLE LOCALIZATION Right 09/30/2015   Procedure: RIGHT BREAST LUMPECTOMY WITH NEEDLE LOCALIZATION;  Surgeon: Fanny Skates, MD;  Location: [facility];  Service: General;  Laterality: Right;  . BREAST SURGERY    . COLONOSCOPY W/ BIOPSIES AND POLYPECTOMY    . EYE SURGERY     bilateral  . HAND SURGERY  1951   RIGHT HAND  . JOINT REPLACEMENT     rt shoulder rotator cuff  . KNEE ARTHROSCOPY     left knee  . LUMBAR LAMINECTOMY/DECOMPRESSION MICRODISCECTOMY Right 01/11/2013   Procedure: LUMBAR LAMINECTOMY/DECOMPRESSION MICRODISCECTOMY 1 LEVEL,Right Lumbar Three-Four;  Surgeon: Elaina Hoops, MD;  Location: [place] [facility];  Service: Neurosurgery;  Laterality: Right;  . ROTATOR CUFF REPAIR  2011   RIGHT  . TONSILLECTOMY    . TOTAL KNEE ARTHROPLASTY     Left  . TOTAL KNEE ARTHROPLASTY Right 12/25/2017   Procedure: TOTAL KNEE ARTHROPLASTY;  Surgeon: Vickey Huger, MD;  Location: [facility];  Service: Orthopedics;  Laterality: Right;   Social History:  reports that she has never smoked. She has never used  smokeless tobacco. She reports that she does not drink alcohol and does not use drugs.  Family / Support Systems Children: Designer, fashion/clothing (Son), Games developer (Daughter) Anticipated Caregiver: Family, hired caregivers and ALF transfer (if needed) Ability/Limitations of Caregiver: Family works, willing to Arts development officer Availability: 24/7  Social History Preferred language: English Religion: Baptist Read: Yes Write: Yes Employment Status: Disabled   Abuse/Neglect Abuse/Neglect Assessment Can Be Completed: Yes Physical Abuse: Denies Verbal Abuse: Denies Sexual Abuse: Denies Exploitation of patient/patient's resources: Denies Self-Neglect: Denies  Emotional Status Pt's affect, behavior and adjustment status: no Recent Psychosocial Issues: no Psychiatric History: no Substance Abuse History: no  Patient / Family Perceptions, Expectations & Goals Pt/Family understanding of illness & functional limitations: yes Premorbid pt/family roles/activities: Patient living in ILF ambulating with RW at MOD I level Anticipated changes in roles/activities/participation: May require asisstance Pt/family expectations/goals: Goal to discharge back to ILF at [place]: None Premorbid Home Care/DME Agencies: Other (Comment) Librarian, academic) Transportation available at discharge: Family able to transport  Discharge Planning Living Arrangements: Alone Support Systems: Children Type of Residence: Independent Living (([facility]: Interior and spatial designer, [place]) Level entry, Gaffer, Child psychotherapist) Insurance Resources: Insurance Case Freight forwarder (specify name) Nurse, mental health Medicare) Financial Screen Referred: No Living Expenses: Rent (ILF) Money Management: Patient, Family Does the  patient have any problems obtaining your medications?: No Home Management: Independent Patient/Family Preliminary Plans: Family to asisst or hire caregivers Care Coordinator  Anticipated Follow Up Needs: ALF/IL DC Planning Additional Notes/Comments: ILF VS. ALF Expected length of stay: 15-19 Days  Clinical Impression SW entered room, nurse at bedside assisting with meals. Sw introduced self to patient, explained role and process. Patient had no questions or concerns. Expressed her plan to discharge back to ILF. Sw called patient daughter provided same information, daughter concerned about patients memory/cognition. Also concerned if patient will discharge ILF VS. ALF. Daughter brining in patient lunch shortly. Will continue to follow up with facility, patient and family.   [name] 08/28/2020, 12:55 PM

## 2020-08-28 NOTE — IPOC Note (Addendum)
Overall Plan of Care [facility]) Patient Details Name: Tara [name] MRN: [id number] DOB: 01-04-1936  Admitting Diagnosis: ICH (intracerebral hemorrhage) [facility])  Hospital Problems: Principal Problem:   ICH (intracerebral hemorrhage) ([place])     Functional Problem List: Nursing Bladder, Bowel, Edema, Nutrition, Perception, Safety, Skin Integrity  PT Balance, Edema, Endurance, Motor, Perception, Safety  OT Balance, Cognition, Safety, Vision  SLP Cognition, Linguistic, Safety  TR         Basic ADL's: OT Grooming, Bathing, Dressing, Toileting     Advanced  ADL's: OT Simple Meal Preparation     Transfers: PT Bed Mobility, Bed to Chair, Car, Patent attorney, Agricultural engineer: PT Ambulation, Emergency planning/management officer, Stairs     Additional Impairments: OT None  SLP Swallowing, Communication, Social Cognition expression Social Interaction, Problem Solving, Memory, Awareness  TR      Anticipated Outcomes Item Anticipated Outcome  Self Feeding modified indepedent  Swallowing  mod I   Basic self-care  supervision  Toileting  supervision   Bathroom Transfers supervision  Bowel/Bladder  pt will manage bladder with modi assistance  Transfers  supervision  Locomotion  supervision  Communication  mod I basic, supervision complex  Cognition  supervision-minA  Pain  pt pain will be less than 2, pt will notify nsg of pain  Safety/Judgment  pt will remain safe with modi assistance   Therapy Plan: PT Intensity: Minimum of 1-2 x/day ,45 to 90 minutes PT Frequency: 5 out of 7 days PT Duration Estimated Length of Stay: ~14 days OT Intensity: Minimum of 1-2 x/day, 45 to 90 minutes OT Frequency: 5 out of 7 days OT Duration/Estimated Length of Stay: 12-14 days SLP Frequency: 3 to 5 out of 7 days SLP Duration/Estimated Length of Stay: 2 weeks   Due to the current state of emergency, patients may not be receiving their 3-hours of Medicare-mandated therapy.   Team  Interventions: Nursing Interventions Bladder Management, Patient/Family Education, Bowel Management, Pain Management, Skin Care/Wound Management, Dysphagia/Aspiration Precaution Training  PT interventions Ambulation/gait training, Community reintegration, DME/adaptive equipment instruction, Psychosocial support, Neuromuscular re-education, Stair training, UE/LE Strength taining/ROM, Wheelchair propulsion/positioning, Training and development officer, Discharge planning, Functional electrical stimulation, Pain management, Skin care/wound management, Therapeutic Activities, UE/LE Coordination activities, Cognitive remediation/compensation, Disease management/prevention, Functional mobility training, Patient/family education, Splinting/orthotics, Therapeutic Exercise, Visual/perceptual remediation/compensation  OT Interventions Balance/vestibular training, Discharge planning, Functional electrical stimulation, Self Care/advanced ADL retraining, Therapeutic Activities, UE/LE Coordination activities, Functional mobility training, Patient/family education, Therapeutic Exercise, Cognitive remediation/compensation, Community reintegration, Engineer, drilling, Neuromuscular re-education, UE/LE Strength taining/ROM, Visual/perceptual remediation/compensation, Disease mangement/prevention, Pain management  SLP Interventions Cognitive remediation/compensation, Cueing hierarchy, Functional tasks, Internal/external aids, Medication managment, Multimodal communication approach, Environmental controls, Dysphagia/aspiration precaution training, Speech/Language facilitation, Patient/family education  TR Interventions    SW/CM Interventions Discharge Planning, Psychosocial Support, Patient/Family Education   Barriers to Discharge MD  Medical stability and Lack of/limited family support  Nursing Incontinence    PT Decreased caregiver support lives alone, decreased memory/safety awareness  OT      SLP  Decreased caregiver support N/A  SW       Team Discharge Planning: Destination: PT-Assisted Living (ILF vs ALF) ,OT- Assisted Living , SLP-Assisted Living Projected Follow-up: PT-Home health PT, OT-  24 hour supervision/assistance, Home health OT, SLP-Home Health SLP, Outpatient SLP, 24 hour supervision/assistance Projected Equipment Needs: PT-Wheelchair cushion (measurements), Wheelchair (measurements), OT- To be determined, SLP-None recommended by SLP Equipment Details: PT-patient owns FWW; wc TBD, OT-  Patient/family involved in discharge planning: PT- Patient,  OT-Patient, SLP-Patient, Family member/caregiver  MD ELOS: 10-14d Medical Rehab Prognosis:  Good Assessment:  84 year old right-handed female with history of right breast cancer with lumpectomy 2016, TIA 2019 placed on aspirin therapy, hypertension, hypothyroidism,, recurrent UTI, bilateral TKA.  Well-known to rehab services 05/28/2019 to 06/19/2019 for lumbar radiculopathy.  Per chart review patient resides independent living facility in [place] and has the ability to transition to ALF if needed.  She has a daughter in the area.  Presented 08/15/2020 with acute onset of aphasia as well as headache.  Noted blood pressure 226/115.  Cranial CT scan showed acute intraparenchymal hemorrhage centered at the right parieto-occipital region estimated volume 13 cc.  Minimal surrounding edema without significant regional mass-effect.  Underlying age-related cerebral atrophy with mild chronic small vessel ischemic disease.  Initially started on 3% protocol.  MRI follow-up again noting acute left occipital parietal hematoma stable maximal dimension when compared to head CT.  Numerous remote microhemorrhages.  No masslike or concerning vascular enhancement.  Small acute or subacute infarct at the bilateral cerebral cortex.  Admission chemistries potassium 3.1, glucose 115, hemoglobin 14.9, hemoglobin A1c 5.5, urinalysis negative nitrite, SARS coronavirus  negative.  EEG negative for seizure.  Echocardiogram with ejection fraction of 65 to 70% no wall motion abnormalities grade 1 diastolic dysfunction.  Latest follow-up cranial CT scan stable left occipital hematoma with mild vasogenic edema..  Tolerating mechanical soft diet   Now requiring 24/7 Rehab RN,MD, as well as CIR level PT, OT and SLP.  Treatment team will focus on ADLs and mobility with goals set at Supervision See Team Conference Notes for weekly updates to the plan of care

## 2020-08-28 NOTE — Progress Notes (Signed)
PHYSICAL MEDICINE & REHABILITATION PROGRESS NOTE   Subjective/Complaints:  No issues overnite, some constipation, appetite fair  ROS- neg CP, SOB, N/V/D  Objective:   CT HEAD WO CONTRAST  Result Date: 08/26/2020 CLINICAL DATA:  Known left occipital parenchymal hematoma EXAM: CT HEAD WITHOUT CONTRAST TECHNIQUE: Contiguous axial images were obtained from the base of the skull through the vertex without intravenous contrast. COMPARISON:  08/19/2020 FINDINGS: Brain: Persistent left occipital parenchymal hemorrhage is again seen. The size of the hematoma is overall stable from the prior exam although the density of the blood components peripherally have decreased consistent with evolving hematoma. Some surrounding vasogenic edema is again identified. No new areas of hemorrhage are seen. No focal mass lesion is noted. Vascular: No hyperdense vessel or unexpected calcification. Skull: Normal. Negative for fracture or focal lesion. Sinuses/Orbits: No acute finding. Other: None. IMPRESSION: Overall stable size of the parenchymal hematoma although the density of the peripheral blood components has decreased somewhat in the interval. Persistent surrounding edema is noted without significant change no new focal abnormality is seen. Electronically Signed   By: Inez Catalina M.D.   On: 08/26/2020 20:41   Recent Labs    08/27/20 0456 08/28/20 0558  WBC 10.6* 11.2*  HGB 12.4 12.8  HCT 37.5 39.5  PLT 215 224   Recent Labs    08/27/20 0456 08/28/20 0558  NA 139 140  K 4.4 3.6  CL 103 102  CO2 29 29  GLUCOSE 102* 108*  BUN 12 10  CREATININE 0.84 0.77  CALCIUM 9.1 9.3    Intake/Output Summary (Last 24 hours) at 08/28/2020 0915 Last data filed at 08/28/2020 0657 Gross per 24 hour  Intake 356 ml  Output 350 ml  Net 6 ml        Physical Exam: Vital Signs Blood pressure (!) 153/62, pulse 64, temperature 98.5 F (36.9 C), resp. rate 16, height 5\' 5"  (1.651 m), weight 71.2 kg, SpO2  96 %.  General: No acute distress Mood and affect are appropriate Heart: Regular rate and rhythm no rubs murmurs or extra sounds Lungs: Clear to auscultation, breathing unlabored, no rales or wheezes Abdomen: Positive bowel sounds, soft nontender to palpation, nondistended Extremities: No clubbing, cyanosis, or edema Skin: No evidence of breakdown, no evidence of rash Neurologic: Cranial nerves II through XII intact, motor strength is 5/5 in bilateral deltoid, bicep, tricep, grip, hip flexor, knee extensors, ankle dorsiflexor and plantar flexor No evidence of field cut on confrontation testing , has some extinction with double simultaneous visual stim  Cerebellar exam normal finger to nose to finger as well as heel to shin in bilateral upper and lower extremities Musculoskeletal: Full range of motion in all 4 extremities. No joint swelling    Assessment/Plan: 1. Functional deficits secondary to Left P-O ICH  which require 3+ hours per day of interdisciplinary therapy in a comprehensive inpatient rehab setting.  Physiatrist is providing close team supervision and 24 hour management of active medical problems listed below.  Physiatrist and rehab team continue to assess barriers to discharge/monitor patient progress toward functional and medical goals  Care Tool:  Bathing              Bathing assist       Upper Body Dressing/Undressing Upper body dressing        Upper body assist      Lower Body Dressing/Undressing Lower body dressing      What is the patient wearing?: Incontinence brief  Lower body assist Assist for lower body dressing: Total Assistance - Patient < 25%     Toileting Toileting    Toileting assist Assist for toileting: Total Assistance - Patient < 25%     Transfers Chair/bed transfer  Transfers assist           Locomotion Ambulation   Ambulation assist              Walk 10 feet activity   Assist           Walk 50  feet activity   Assist           Walk 150 feet activity   Assist           Walk 10 feet on uneven surface  activity   Assist           Wheelchair     Assist               Wheelchair 50 feet with 2 turns activity    Assist            Wheelchair 150 feet activity     Assist          Blood pressure (!) 153/62, pulse 64, temperature 98.5 F (36.9 C), resp. rate 16, height 5\' 5"  (1.651 m), weight 71.2 kg, SpO2 96 %.     Medical Problem List and Plan: 1.  Decreased functional mobility with aphasia secondary to left parieto-occipital acute ICH and B/L cerebral infarcts- prior leg braces worn by pt- ?AFO's             -patient may  shower             -ELOS/Goals: 10-14 days- mod I to supervision CIR, PT, OT SLP evals 2.  Antithrombotics: -DVT/anticoagulation: SCDs-  Due to [place]             -antiplatelet therapy: N/A 3. Pain Management: Tylenol as needed 4. Mood: Valium 5 mg twice daily as needed anxiety             -antipsychotic agents: N/A 5. Neuropsych: This patient is capable of making decisions on her own behalf. 6. Skin/Wound Care: Routine skin checks 7. Fluids/Electrolytes/Nutrition: Routine in and outs with follow-up chemistries 8.  Hypertension.  Cozaar 50 mg daily, Norvasc 10 mg daily.  Monitor with increased mobility Vitals:   08/27/20 2011 08/28/20 0434  BP: (!) 140/56 (!) 153/62  Pulse: 79 64  Resp: 17 16  Temp: 98.2 F (36.8 C) 98.5 F (36.9 C)  SpO2: 97% 96%  Mild elevation of systolic BP, will monitor prior to med changes   9.  Hypothyroidism.  TSH 1.722.  Continue Synthroid  LOS: 1 days A FACE TO FACE EVALUATION WAS PERFORMED  Charlett Blake 08/28/2020, 9:15 AM

## 2020-08-28 NOTE — Evaluation (Signed)
Physical Therapy Assessment and Plan  Patient Details  Name: Tara [name] MRN: [id number] Date of Birth: 1936/10/16  PT Diagnosis: Abnormal posture, Abnormality of gait, Cognitive deficits, Coordination disorder, Difficulty walking, Edema, Impaired cognition and Muscle weakness Rehab Potential: Good ELOS: ~14 days   Today's Date: 08/28/2020 PT Individual Time: 0800-0857 PT Individual Time Calculation (min): 57 min    Hospital Problem: Principal Problem:   ICH (intracerebral hemorrhage) ([facility])   Past Medical History:  Past Medical History:  Diagnosis Date  . Arthritis    knees  . Breast cancer ([facility]) 09/30/15   right breast  . Dependent edema   . Depression    just lost spouse in Nov. 2018  . Essential hypertension, benign 04/15/2011  . Herniated lumbar disc without myelopathy   . Hx of staphylococcal infection   . Hypertension   . Hypothyroidism   . Obesity   . Pre-diabetes   . Radicular pain   . Recurrent UTI   . Stroke [facility])    ' mini"  . Thyroid disease    hypothyroidism  . Vertigo   . Wears contact lenses    one   Past Surgical History:  Past Surgical History:  Procedure Laterality Date  . ANKLE SURGERY  1994   RIGHT  . BACK SURGERY    . BREAST LUMPECTOMY WITH NEEDLE LOCALIZATION Right 09/30/2015   Procedure: RIGHT BREAST LUMPECTOMY WITH NEEDLE LOCALIZATION;  Surgeon: Fanny Skates, MD;  Location: [place];  Service: General;  Laterality: Right;  . BREAST SURGERY    . COLONOSCOPY W/ BIOPSIES AND POLYPECTOMY    . EYE SURGERY     bilateral  . HAND SURGERY  1951   RIGHT HAND  . JOINT REPLACEMENT     rt shoulder rotator cuff  . KNEE ARTHROSCOPY     left knee  . LUMBAR LAMINECTOMY/DECOMPRESSION MICRODISCECTOMY Right 01/11/2013   Procedure: LUMBAR LAMINECTOMY/DECOMPRESSION MICRODISCECTOMY 1 LEVEL,Right Lumbar Three-Four;  Surgeon: Elaina Hoops, MD;  Location: [place] [facility];  Service: Neurosurgery;  Laterality: Right;  . ROTATOR CUFF REPAIR   2011   RIGHT  . TONSILLECTOMY    . TOTAL KNEE ARTHROPLASTY     Left  . TOTAL KNEE ARTHROPLASTY Right 12/25/2017   Procedure: TOTAL KNEE ARTHROPLASTY;  Surgeon: Vickey Huger, MD;  Location: [place];  Service: Orthopedics;  Laterality: Right;    Assessment & Plan Clinical Impression: Patient is a 84 y.o. year old female with  history of right breast cancer with lumpectomy 2016, TIA 2019 placed on aspirin therapy, hypertension, hypothyroidism,, recurrent UTI, bilateral TKA.  Well-known to rehab services 05/28/2019 to 06/19/2019 for lumbar radiculopathy.  Per chart review patient resides independent living facility in [place] and has the ability to transition to ALF if needed.  She has a daughter in the area.  Presented 08/15/2020 with acute onset of aphasia as well as headache.  Noted blood pressure 226/115.  Cranial CT scan showed acute intraparenchymal hemorrhage centered at the right parieto-occipital region estimated volume 13 cc.  Minimal surrounding edema without significant regional mass-effect.  Underlying age-related cerebral atrophy with mild chronic small vessel ischemic disease.  Initially started on 3% protocol.  MRI follow-up again noting acute left occipital parietal hematoma stable maximal dimension when compared to head CT.  Numerous remote microhemorrhages.  No masslike or concerning vascular enhancement.  Small acute or subacute infarct at the bilateral cerebral cortex.  Admission chemistries potassium 3.1, glucose 115, hemoglobin 14.9, hemoglobin A1c 5.5, urinalysis negative nitrite, SARS coronavirus  negative.  EEG negative for seizure.  Echocardiogram with ejection fraction of 65 to 70% no wall motion abnormalities grade 1 diastolic dysfunction.  Latest follow-up cranial CT scan stable left occipital hematoma with mild vasogenic edema..  Tolerating mechanical soft diet.  Therapy evaluations completed and patient was admitted for a comprehensive rehab program. Patient currently requires min with  mobility secondary to muscle weakness, decreased cardiorespiratoy endurance, impaired timing and sequencing, unbalanced muscle activation, motor apraxia, decreased coordination and decreased motor planning, field cut, decreased attention to right and decreased motor planning, decreased initiation, decreased attention, decreased awareness, decreased problem solving, decreased safety awareness, decreased memory and delayed processing and decreased standing balance, decreased postural control and decreased balance strategies.  Prior to hospitalization, patient was modified independent  with mobility and lived with   in a   home.  Home access is   .  Patient will benefit from skilled PT intervention to maximize safe functional mobility and minimize fall risk for planned discharge home with 24 hour supervision.  Anticipate patient will benefit from follow up [place] at discharge.  PT - End of Session Activity Tolerance: Tolerates 30+ min activity with multiple rests Endurance Deficit: Yes Endurance Deficit Description: patient requiring multiple seated rest breaks PT Assessment Rehab Potential (ACUTE/IP ONLY): Good PT Barriers to Discharge: Decreased caregiver support PT Barriers to Discharge Comments: lives alone, decreased memory/safety awareness PT Patient demonstrates impairments in the following area(s): Balance;Edema;Endurance;Motor;Perception;Safety PT Transfers Functional Problem(s): Bed Mobility;Bed to Chair;Car;Furniture PT Locomotion Functional Problem(s): Ambulation;Wheelchair Mobility;Stairs PT Plan PT Intensity: Minimum of 1-2 x/day ,45 to 90 minutes PT Frequency: 5 out of 7 days PT Duration Estimated Length of Stay: ~14 days PT Treatment/Interventions: Ambulation/gait training;Community reintegration;DME/adaptive equipment instruction;Psychosocial support;Neuromuscular re-education;Stair training;UE/LE Strength taining/ROM;Wheelchair propulsion/positioning;Balance/vestibular  training;Discharge planning;Functional electrical stimulation;Pain management;Skin care/wound management;Therapeutic Activities;UE/LE Coordination activities;Cognitive remediation/compensation;Disease management/prevention;Functional mobility training;Patient/family education;Splinting/orthotics;Therapeutic Exercise;Visual/perceptual remediation/compensation PT Transfers Anticipated Outcome(s): supervision PT Locomotion Anticipated Outcome(s): supervision PT Recommendation Recommendations for Other Services: Neuropsych consult;Therapeutic Recreation consult (patient voicing profound depression regarding husbands death and may benefit from healthy coping strategies) Therapeutic Recreation Interventions: Stress management;Kitchen group Follow Up Recommendations: Home health PT Patient destination: Assisted Living (ILF vs ALF) Equipment Recommended: Wheelchair cushion (measurements);Wheelchair (measurements) Equipment Details: patient owns FWW; wc TBD   PT Evaluation Precautions/Restrictions Precautions Precautions: Fall Precaution Comments: extensor tone, hx of vertigo Restrictions Weight Bearing Restrictions: No Home Living/Prior Functioning Home Living Available Help at Discharge: Family;Available PRN/intermittently Type of Home: Independent living facility ((ability to transition into ALF)) Home Access: Level entry Home Layout: One level Bathroom Shower/Tub: Multimedia programmer: Handicapped height Bathroom Accessibility: Yes  Lives With: Alone Prior Function Level of Independence: Requires assistive device for independence;Independent with transfers;Independent with homemaking with ambulation;Independent with basic ADLs  Able to Take Stairs?: No Driving: No Vocation: Retired Comments: ambulates with use of RW Vision/Perception  Perception Perception: Impaired Praxis Praxis: Impaired Praxis Impairment Details: Motor planning  R field cut  Cognition Overall  Cognitive Status: Impaired/Different from baseline Arousal/Alertness: Awake/alert Orientation Level: Oriented to person;Oriented to place;Disoriented to time;Oriented to situation Memory: Impaired Awareness: Impaired Problem Solving: Impaired Safety/Judgment: Impaired Sensation Sensation Light Touch: Appears Intact Hot/Cold: Not tested Proprioception: Appears Intact Stereognosis: Appears Intact Coordination Gross Motor Movements are Fluid and Coordinated: No Fine Motor Movements are Fluid and Coordinated: No Heel Shin Test: limited ROM; figure 8 test: patient unable to complete figure 8 in B LE Motor  Motor Motor: Motor apraxia;Abnormal postural alignment and control Motor - Skilled Clinical Observations: posterior lean upon standing,  decreased motor planning through simple transfers without Max verbal cuing   Trunk/Postural Assessment  Cervical Assessment Cervical Assessment: Exceptions to [facility] Thoracic Assessment Thoracic Assessment: Exceptions to [facility] (increased kyphosis) Lumbar Assessment Lumbar Assessment: Exceptions to [facility] (posterior pelvic tilt) Postural Control Postural Control: Deficits on evaluation Trunk Control: posterior lean in sitting Righting Reactions: delayed and inadequate Protective Responses: delayed and inadequate  Balance Balance Balance Assessed: Yes Standardized Balance Assessment Standardized Balance Assessment: Timed Up and Go Test Timed Up and Go Test TUG: Normal TUG Normal TUG (seconds): 27.8 (FWW + MinA for stability) Dynamic Sitting Balance Sitting balance - Comments: posterior lean requiring CGA from PT Extremity Assessment      RLE Assessment RLE Assessment: Exceptions to [facility] RLE Strength Right Hip Flexion: 3+/5 Right Hip ABduction: 4-/5 Right Hip ADduction: 4-/5 Right Knee Flexion: 3+/5 Right Knee Extension: 4-/5 Right Ankle Dorsiflexion: 4-/5 Right Ankle Plantar Flexion: 4-/5 LLE Assessment LLE Assessment: Exceptions to  [facility] LLE Strength Left Hip Flexion: 3+/5 Left Hip ABduction: 4-/5 Left Hip ADduction: 4-/5 Left Knee Flexion: 4-/5 Left Knee Extension: 4-/5 Left Ankle Dorsiflexion: 4-/5 Left Ankle Plantar Flexion: 4-/5  Care Tool Care Tool Bed Mobility Roll left and right activity   Roll left and right assist level: Supervision/Verbal cueing (Simultaneous filing. User may not have seen previous data.)    Sit to lying activity   Sit to lying assist level: Contact Guard/Touching assist    Lying to sitting edge of bed activity   Lying to sitting edge of bed assist level: Contact Guard/Touching assist     Care Tool Transfers Sit to stand transfer   Sit to stand assist level: Minimal Assistance - Patient > 75%    Chair/bed transfer   Chair/bed transfer assist level: Minimal Assistance - Patient > 75%     Toilet transfer   Assist Level: Minimal Assistance - Patient > 75%    Car transfer   Car transfer assist level: Minimal Assistance - Patient > 75%      Care Tool Locomotion Ambulation   Assist level: Minimal Assistance - Patient > 75%   Max distance: 45  Walk 10 feet activity   Assist level: Minimal Assistance - Patient > 75% Assistive device: Walker-rolling   Walk 50 feet with 2 turns activity Walk 50 feet with 2 turns activity did not occur: Safety/medical concerns (patient fatigue)      Walk 150 feet activity Walk 150 feet activity did not occur: Safety/medical concerns (patient fatigue)      Walk 10 feet on uneven surfaces activity Walk 10 feet on uneven surfaces activity did not occur: Safety/medical concerns (patient fatigue)      Stairs Stair activity did not occur: Safety/medical concerns (patient fatigue)        Walk up/down 1 step activity Walk up/down 1 step or curb (drop down) activity did not occur: Safety/medical concerns (patient fatigue)        Walk up/down 4 steps activity      Walk up/down 12 steps activity Walk up/down 12 steps activity did not occur:  Safety/medical concerns (patient fatigue)      Pick up small objects from floor Pick up small object from the floor (from standing position) activity did not occur: Safety/medical concerns (patient fatigue)      Wheelchair Will patient use wheelchair at discharge?: No Type of Wheelchair: Manual     Max wheelchair distance: 150  Wheel 50 feet with 2 turns activity   Assist Level: Minimal Assistance - Patient >  75%  Wheel 150 feet activity   Assist Level: Minimal Assistance - Patient > 75%    Refer to Care Plan for Long Term Goals  SHORT TERM GOAL WEEK 1 PT Short Term Goal 1 (Week 1): Patient will complete bed <>wc transfer with LRAD and CGA consistently PT Short Term Goal 2 (Week 1): Patient will ambulate >21f with MinA and LRAD PT Short Term Goal 3 (Week 1): Patient will complete 1 step with LRAD and no more than ModA  Recommendations for other services: Neuropsych and Therapeutic Recreation  Stress management  Skilled Therapeutic Intervention Mobility Bed Mobility Bed Mobility: Rolling Right;Rolling Left;Supine to Sit;Sitting - Scoot to [employer] Bed;Sit to Supine Rolling Right: Supervision/verbal cueing Rolling Left: Supervision/Verbal cueing Supine to Sit: Contact Guard/Touching assist Sitting - Scoot to Edge of Bed: Contact Guard/Touching assist Sit to Supine: Supervision/Verbal cueing Transfers Transfers: Sit to Stand;Stand to Sit;Stand Pivot Transfers Sit to Stand: Minimal Assistance - Patient > 75% Stand to Sit: Minimal Assistance - Patient > 75% Stand Pivot Transfers: Minimal Assistance - Patient > 75% Stand Pivot Transfer Details: Tactile cues for sequencing;Verbal cues for safe use of DME/AE;Verbal cues for technique;Manual facilitation for placement;Verbal cues for precautions/safety Transfer (Assistive device): 1 person hand held assist Locomotion  Gait Ambulation: Yes Gait Assistance: Minimal Assistance - Patient > 75% Gait Distance (Feet): 45 Feet Assistive  device: Rolling walker Gait Assistance Details: Tactile cues for posture;Verbal cues for safe use of DME/AE;Verbal cues for gait pattern;Tactile cues for initiation Gait Gait: Yes Gait Pattern: Impaired Gait Pattern: Decreased trunk rotation;Wide base of support;Poor foot clearance - right;Poor foot clearance - left;Shuffle Gait velocity: decreased Stairs / Additional Locomotion Stairs: No WArchitect Yes Wheelchair Assistance: Minimal assistance - Patient >75% Wheelchair Propulsion: Both upper extremities Wheelchair Parts Management: Needs assistance Distance: 150  Patient received supine in bed agreeable to PT. She denies pain. Patient is not oriented to time, but is orientated to person, place and situation. She demonstrates poor motor planning and safety awareness resulting in her requiring increased cuing and assist to maintain safety throughout functional mobility. Patient with apparent R visual field cut that impairs her ability to complete functional transfers and gait safely without assist at this time. Patient demonstrating poor coordination in B LE. At end of eval, patient returning to bed, bed alarm on, call light within reach.   Discharge Criteria: Patient will be discharged from PT if patient refuses treatment 3 consecutive times without medical reason, if treatment goals not met, if there is a change in medical status, if patient makes no progress towards goals or if patient is discharged from hospital.  The above assessment, treatment plan, treatment alternatives and goals were discussed and mutually agreed upon: by patient  JDebbora Dus10/06/2020, 7:36 AM

## 2020-08-29 ENCOUNTER — Inpatient Hospital Stay (HOSPITAL_COMMUNITY): Payer: Medicare Other | Admitting: Physical Therapy

## 2020-08-29 ENCOUNTER — Inpatient Hospital Stay (HOSPITAL_COMMUNITY): Payer: Medicare Other | Admitting: Occupational Therapy

## 2020-08-29 ENCOUNTER — Inpatient Hospital Stay (HOSPITAL_COMMUNITY): Payer: Medicare Other | Admitting: Speech Pathology

## 2020-08-29 MED ORDER — LORAZEPAM 0.5 MG PO TABS
0.5000 mg | ORAL_TABLET | Freq: Three times a day (TID) | ORAL | Status: DC
  Administered 2020-08-29 – 2020-08-30 (×4): 0.5 mg via ORAL
  Filled 2020-08-29 (×4): qty 1

## 2020-08-29 NOTE — Progress Notes (Signed)
Occupational Therapy Session Note  Patient Details  Name: Tara [name] MRN: [id number] Date of Birth: 06/30/1936  Today's Date: 08/29/2020 OT Individual Time: 0850-1000 OT Individual Time Calculation (min): 70 min    Short Term Goals: Week 1:  OT Short Term Goal 1 (Week 1): Pt will complete LB bathing sit to stand with min guard assist for two consecutive sessions. OT Short Term Goal 2 (Week 1): Pt will complete LB dressing with min assist sit to stand and AE PRN. OT Short Term Goal 3 (Week 1): Pt will complete toilet transfer with min guard assist using the RW for support. OT Short Term Goal 4 (Week 1): Pt will scan right of midline with no more than min instructional cueing to avoid obstacles on the right when ambulating with the RW to the bathroom.  Skilled Therapeutic Interventions/Progress Updates:  Patient met lying supine in bed in agreement with OT treatment session. Patient reporting 0/10 pain at rest and with movement but reports increased dizziness that worsens with change in position or standing. Supine to EOB with assist to bring trunk upright. Patient completed sit to stand from EOB and stand pivot transfer to wc with Mod A and cues for hand placement and sequencing. Patient with difficulty processing familiar tasks requiring increased cueing. Toilet transfer, toileting/hygiene/clothing management with Mod A and cues for sequencing. Seated at sink level, patient completed oral hygiene with ability to locate items on right side of sink with increased cueing for lighthouse technique. Patient required 1 step verbal cues to sequence completion of task including cues to put cap on toothpaste, rinse toothbrush, and rinse mouth with water. Cervical ROM and soft tissue massage at trigger points to reduce dizziness. Session concluded with patient lying supine in bed with call bell within reach and bed alarm activated.   Therapy Documentation Precautions:  Precautions Precautions:  Fall Precaution Comments: history of vertigo Restrictions Weight Bearing Restrictions: No General:   Therapy/Group: Individual Therapy  Anasia Agro R [place]-Davis 08/29/2020, 9:16 AM

## 2020-08-29 NOTE — Progress Notes (Signed)
Speech Language Pathology Daily Session Note  Patient Details  Name: Tara [name] MRN: [id number] Date of Birth: July 11, 1936  Today's Date: 08/29/2020 SLP Individual Time: 1100-1200 SLP Individual Time Calculation (min): 60 min  Short Term Goals: Week 1: SLP Short Term Goal 1 (Week 1): Patient will tolerate regular texture solids, thin liquids with mod I for swallow safety SLP Short Term Goal 2 (Week 1): Patient will maintain topic during structured conversation with modA SLP Short Term Goal 3 (Week 1): Patient will use external aids for orientation to time, situation and recall of recent events SLP Short Term Goal 4 (Week 1): Patient will demonstrate awareness to deficits during functional tasks with minA SLP Short Term Goal 5 (Week 1): Patient will perform ADL's safely with minA for safety and accuracy.  Skilled Therapeutic Interventions:   Patient seen for skilled ST session focusing on cognitive-linguistic goals. Patient reported that her vertigo got bad yesterday and she was still feeling bad and tired out today. Session was completed in room with patient in bed. She was significantly more delayed when responding to orientation and biographical questions and would say, "let me think" and although she was able to restate the original question 20 seconds later, she required maximal A cues to initiate a response, ie: when asked her age (yesterday she promptly said "72"), her response was very delayed. She became emotionally labile, crying when talking about her late husband (per patient he died 39 years ago). As yesterday, she was very circumlocutious when talking about personal history and family and during this and other unstructured conversation, she required maxA cues to redirect to maintain topic and to directly respond to questions asked. She was oriented to the fact that she was in the hospital and in  but not [facility] and although she did recall yesterday, was not able to  state that she was on the 4th floor. Patient continues to benefit from skilled SLP intervention to maximize cognitive-linguistic and swallow function prior to discharge.  Pain Pain Assessment Pain Scale: 0-10 Pain Score: 0-No pain  Therapy/Group: Individual Therapy  Sonia Baller, MA, CCC-SLP Speech Therapy

## 2020-08-29 NOTE — Progress Notes (Signed)
[place] PHYSICAL MEDICINE & REHABILITATION PROGRESS NOTE   Subjective/Complaints:  Pt reports super dizzy- like her vertigo she's had since the 1970s-  Also per OT, losing things on R side- cannot see them.     ROS-  Pt denies SOB, abd pain, CP, N/V/C/D, and vision changes   Objective:   No results found. Recent Labs    08/27/20 0456 08/28/20 0558  WBC 10.6* 11.2*  HGB 12.4 12.8  HCT 37.5 39.5  PLT 215 224   Recent Labs    08/27/20 0456 08/28/20 0558  NA 139 140  K 4.4 3.6  CL 103 102  CO2 29 29  GLUCOSE 102* 108*  BUN 12 10  CREATININE 0.84 0.77  CALCIUM 9.1 9.3    Intake/Output Summary (Last 24 hours) at 08/29/2020 1459 Last data filed at 08/29/2020 1300 Gross per 24 hour  Intake 900 ml  Output --  Net 900 ml        Physical Exam: Vital Signs Blood pressure (!) 120/55, pulse 67, temperature (!) 97.4 F (36.3 C), temperature source Oral, resp. rate 17, height 5\' 5"  (1.651 m), weight 70.9 kg, SpO2 98 %.  General: No acute distress- sitting up at sink brushing teeth, NAD Mood and affect are appropriate Heart:RRR Lungs: CTA B/L- no W/R/R- good air movement Abdomen: Soft, NT, ND, (+)BS  Extremities: No clubbing, cyanosis, or edema Skin: No evidence of breakdown, no evidence of rash Neurologic: Cranial nerves II through XII intact, motor strength is 5/5 in bilateral deltoid, bicep, tricep, grip, hip flexor, knee extensors, ankle dorsiflexor and plantar flexor No evidence of field cut on confrontation testing , has some extinction with double simultaneous visual stim  Cerebellar exam normal finger to nose to finger as well as heel to shin in bilateral upper and lower extremities Musculoskeletal: Full range of motion in all 4 extremities. No joint swelling HENT:- has post auricular trigger points on exam B/L    Assessment/Plan: 1. Functional deficits secondary to Left P-O ICH  which require 3+ hours per day of interdisciplinary therapy in a  comprehensive inpatient rehab setting.  Physiatrist is providing close team supervision and 24 hour management of active medical problems listed below.  Physiatrist and rehab team continue to assess barriers to discharge/monitor patient progress toward functional and medical goals  Care Tool:  Bathing    Body parts bathed by patient: Right arm, Left arm, Chest, Abdomen, Front perineal area, Buttocks, Right upper leg, Left upper leg, Face   Body parts bathed by helper: Left lower leg, Right lower leg     Bathing assist Assist Level: Minimal Assistance - Patient > 75%     Upper Body Dressing/Undressing Upper body dressing   What is the patient wearing?: Pull over shirt    Upper body assist Assist Level: Minimal Assistance - Patient > 75%    Lower Body Dressing/Undressing Lower body dressing      What is the patient wearing?: Incontinence brief     Lower body assist Assist for lower body dressing: Moderate Assistance - Patient 50 - 74%     Toileting Toileting    Toileting assist Assist for toileting: Maximal Assistance - Patient 25 - 49%     Transfers Chair/bed transfer  Transfers assist     Chair/bed transfer assist level: Moderate Assistance - Patient 50 - 74%     Locomotion Ambulation   Ambulation assist      Assist level: Moderate Assistance - Patient 50 - 74% Assistive device: Walker-rolling  Max distance: 10'   Walk 10 feet activity   Assist     Assist level: Minimal Assistance - Patient > 75% Assistive device: Walker-rolling   Walk 50 feet activity   Assist Walk 50 feet with 2 turns activity did not occur: Safety/medical concerns (patient fatigue)         Walk 150 feet activity   Assist Walk 150 feet activity did not occur: Safety/medical concerns (patient fatigue)         Walk 10 feet on uneven surface  activity   Assist Walk 10 feet on uneven surfaces activity did not occur: Safety/medical concerns (patient  fatigue)         Wheelchair     Assist Will patient use wheelchair at discharge?: No Type of Wheelchair: Manual    Wheelchair assist level: Minimal Assistance - Patient > 75% Max wheelchair distance: 150    Wheelchair 50 feet with 2 turns activity    Assist        Assist Level: Minimal Assistance - Patient > 75%   Wheelchair 150 feet activity     Assist      Assist Level: Minimal Assistance - Patient > 75%   Blood pressure (!) 120/55, pulse 67, temperature (!) 97.4 F (36.3 C), temperature source Oral, resp. rate 17, height 5\' 5"  (1.651 m), weight 70.9 kg, SpO2 98 %.     Medical Problem List and Plan: 1.  Decreased functional mobility with aphasia secondary to left parieto-occipital acute ICH and B/L cerebral infarcts- prior leg braces worn by pt- ?AFO's             -patient may  shower             -ELOS/Goals: 10-14 days- mod I to supervision CIR, PT, OT SLP evals 2.  Antithrombotics: -DVT/anticoagulation: SCDs-  Due to [place]             -antiplatelet therapy: N/A 3. Pain Management: Tylenol as needed 4. Mood: Valium 5 mg twice daily as needed anxiety             -antipsychotic agents: N/A 5. Neuropsych: This patient is capable of making decisions on her own behalf. 6. Skin/Wound Care: Routine skin checks 7. Fluids/Electrolytes/Nutrition: Routine in and outs with follow-up chemistries 8.  Hypertension.  Cozaar 50 mg daily, Norvasc 10 mg daily.  Monitor with increased mobility Vitals:   08/29/20 0531 08/29/20 1340  BP: (!) 132/59 (!) 120/55  Pulse: 63 67  Resp: 18 17  Temp: 98.7 F (37.1 C) (!) 97.4 F (36.3 C)  SpO2: 95% 98%  Mild elevation of systolic BP, will monitor prior to med changes   10/9- BP controlled con't regimen  9.  Hypothyroidism.  TSH 1.722.  Continue Synthroid 10. Vertigo  10/9- will try Ativan 0.5 mg TID- if not effective, will try trigger point injections; also asked OT to do myofascial release.    LOS: 2 days A FACE  TO FACE EVALUATION WAS PERFORMED  Anu Stagner 08/29/2020, 2:59 PM

## 2020-08-29 NOTE — Progress Notes (Signed)
Physical Therapy Session Note  Patient Details  Name: Tara [name] MRN: [id number] Date of Birth: [birth year]  Today's Date: 08/29/2020 PT Individual Time: 1530-1630 PT Individual Time Calculation (min): 60 min   Short Term Goals: Week 1:  PT Short Term Goal 1 (Week 1): Patient will complete bed <>wc transfer with LRAD and CGA consistently PT Short Term Goal 2 (Week 1): Patient will ambulate >68f with MinA and LRAD PT Short Term Goal 3 (Week 1): Patient will complete 1 step with LRAD and no more than ModA  Skilled Therapeutic Interventions/Progress Updates:   Pt received supine in bed and agreeable to PT. Supine>sit transfer with supervision assist, cues for attention to the R UE and increased time. Stand [facility] and RW and moderate cues for turning technique and awareness of the R side.   Gait training in rehab gym with RW 2 x 418fwith min assist and moderate cues for visual scanning to the R side for safety.   Stair management training x 4 steps with min assist ascent and mod assist descent with moderate cues for motor planning, improved Ue placement and awareness of th R side.   [facility] Lateral reach to the R to obtain bean bag. Pt unable to toss bean bag towards target, due to increasing dizzines and R lateral lean following visual scan and reach to the R. Pt allowed to rest until vertigo s/s decreased. Pt then performed visual scanning to the R to obtain and toss bean bag to target x 5.   Pt returned to room and performed stand pivot transfer to bed with RW and min assist. Sit>supine completed with supervision assist, and left supine in bed with call bell in reach and all needs met.       Therapy Documentation Precautions:  Precautions Precautions: Fall Precaution Comments: history of vertigo Restrictions Weight Bearing Restrictions: No    Vital Signs: Therapy Vitals Temp: (!) 97.4 F  (36.3 C) Temp Source: Oral Pulse Rate: 67 Resp: 17 BP: (!) 120/55 Patient Position (if appropriate): Sitting Oxygen Therapy SpO2: 98 % O2 Device: Room Air Pain: Pain Assessment Pain Scale: 0-10 Pain Score: 0-No pain   Therapy/Group: Individual Therapy  AuLorie Phenix0/07/2020, 4:29 PM

## 2020-08-30 MED ORDER — LORAZEPAM 0.5 MG PO TABS
0.5000 mg | ORAL_TABLET | Freq: Four times a day (QID) | ORAL | Status: DC | PRN
  Administered 2020-08-31 – 2020-09-06 (×2): 0.5 mg via ORAL
  Filled 2020-08-30 (×2): qty 1

## 2020-08-30 NOTE — Progress Notes (Addendum)
Patient assisted to bathroom in wheelchair noted difficulty in processing simple tasks, a lot of cueing need, reoriented, gait unsteady ,states some dizziness denies visional issues.Continent of bladder, provided oral prune juice secondary to constipation as requested and stool softener. Bed alarm set on middle key and call bell in easy reach. Closely monitor

## 2020-08-30 NOTE — Progress Notes (Signed)
[place] PHYSICAL MEDICINE & REHABILITATION PROGRESS NOTE   Subjective/Complaints:  Pt reports vertigo/dizziness MUCH improved with Ativan and OT myofascial release.   Really helped, but was found in hallway this AM- has climbed out of bed; in spite of bed alarm (wasn't sounding at nursing station), and got to doorway. Remembers a little.    ROS-   Pt denies SOB, abd pain, CP, N/V/C/D, and vision changes   Objective:   No results found. Recent Labs    08/28/20 0558  WBC 11.2*  HGB 12.8  HCT 39.5  PLT 224   Recent Labs    08/28/20 0558  NA 140  K 3.6  CL 102  CO2 29  GLUCOSE 108*  BUN 10  CREATININE 0.77  CALCIUM 9.3    Intake/Output Summary (Last 24 hours) at 08/30/2020 1502 Last data filed at 08/30/2020 1030 Gross per 24 hour  Intake 1036 ml  Output --  Net 1036 ml        Physical Exam: Vital Signs Blood pressure (!) 114/53, pulse 71, temperature 97.8 F (36.6 C), resp. rate 17, height 5\' 5"  (1.651 m), weight 68.6 kg, SpO2 96 %.  General: sitting up in bed- eating breakfast, NT in room to monitor pt, NAD Mood and affect are appropriate Heart:RRR Lungs: CTA B/L- no W/R/R- good air movement Abdomen: Soft, NT, ND, (+)BS  Extremities: No clubbing, cyanosis, or edema Skin: No evidence of breakdown, no evidence of rash Neurologic: Cranial nerves II through XII intact, motor strength is 5/5 in bilateral deltoid, bicep, tricep, grip, hip flexor, knee extensors, ankle dorsiflexor and plantar flexor No evidence of field cut on confrontation testing , has some extinction with double simultaneous visual stim  Cerebellar exam normal finger to nose to finger as well as heel to shin in bilateral upper and lower extremities Musculoskeletal: Full range of motion in all 4 extremities. No joint swelling HENT:- has post auricular trigger points on exam B/L- slightly better    Assessment/Plan: 1. Functional deficits secondary to Left P-O ICH  which require 3+  hours per day of interdisciplinary therapy in a comprehensive inpatient rehab setting.  Physiatrist is providing close team supervision and 24 hour management of active medical problems listed below.  Physiatrist and rehab team continue to assess barriers to discharge/monitor patient progress toward functional and medical goals  Care Tool:  Bathing    Body parts bathed by patient: Right arm, Left arm, Chest, Abdomen, Front perineal area, Buttocks, Right upper leg, Left upper leg, Face   Body parts bathed by helper: Left lower leg, Right lower leg     Bathing assist Assist Level: Minimal Assistance - Patient > 75%     Upper Body Dressing/Undressing Upper body dressing   What is the patient wearing?: Pull over shirt    Upper body assist Assist Level: Minimal Assistance - Patient > 75%    Lower Body Dressing/Undressing Lower body dressing      What is the patient wearing?: Incontinence brief     Lower body assist Assist for lower body dressing: Maximal Assistance - Patient 25 - 49%     Toileting Toileting    Toileting assist Assist for toileting: Moderate Assistance - Patient 50 - 74%     Transfers Chair/bed transfer  Transfers assist     Chair/bed transfer assist level: Minimal Assistance - Patient > 75%     Locomotion Ambulation   Ambulation assist      Assist level: Moderate Assistance - Patient 50 -  74% Assistive device: Walker-rolling Max distance: 10'   Walk 10 feet activity   Assist     Assist level: Minimal Assistance - Patient > 75% Assistive device: Walker-rolling   Walk 50 feet activity   Assist Walk 50 feet with 2 turns activity did not occur: Safety/medical concerns (patient fatigue)         Walk 150 feet activity   Assist Walk 150 feet activity did not occur: Safety/medical concerns (patient fatigue)         Walk 10 feet on uneven surface  activity   Assist Walk 10 feet on uneven surfaces activity did not occur:  Safety/medical concerns (patient fatigue)         Wheelchair     Assist Will patient use wheelchair at discharge?: No Type of Wheelchair: Manual    Wheelchair assist level: Minimal Assistance - Patient > 75% Max wheelchair distance: 150    Wheelchair 50 feet with 2 turns activity    Assist        Assist Level: Minimal Assistance - Patient > 75%   Wheelchair 150 feet activity     Assist      Assist Level: Minimal Assistance - Patient > 75%   Blood pressure (!) 114/53, pulse 71, temperature 97.8 F (36.6 C), resp. rate 17, height 5\' 5"  (1.651 m), weight 68.6 kg, SpO2 96 %.     Medical Problem List and Plan: 1.  Decreased functional mobility with aphasia secondary to left parieto-occipital acute ICH and B/L cerebral infarcts- prior leg braces worn by pt- ?AFO's             -patient may  shower             -ELOS/Goals: 10-14 days- mod I to supervision CIR, PT, OT SLP evals 2.  Antithrombotics: -DVT/anticoagulation: SCDs-  Due to [place]             -antiplatelet therapy: N/A 3. Pain Management: Tylenol as needed  10/10- tylenol prn 4. Mood: Valium 5 mg twice daily as needed anxiety  10/10- switched with Ativan which is better for vertigo-              -antipsychotic agents: N/A 5. Neuropsych: This patient is capable of making decisions on her own behalf. 6. Skin/Wound Care: Routine skin checks 7. Fluids/Electrolytes/Nutrition: Routine in and outs with follow-up chemistries 8.  Hypertension.  Cozaar 50 mg daily, Norvasc 10 mg daily.  Monitor with increased mobility Vitals:   08/30/20 0447 08/30/20 1447  BP: (!) 145/76 (!) 114/53  Pulse: 71 71  Resp: 18 17  Temp: 97.9 F (36.6 C) 97.8 F (36.6 C)  SpO2:  96%  Mild elevation of systolic BP, will monitor prior to med changes   10/10- BP borderline- will monitor  9.  Hypothyroidism.  TSH 1.722.  Continue Synthroid 10. Vertigo  10/9- will try Ativan 0.5 mg TID- if not effective, will try trigger point  injections; also asked OT to do myofascial release.   10/10- changed to prn and stopped Valium for now, don't need both, esp if was walking up the hallway ealry AM. Still might benefit from trigger point injections post auricular.   LOS: 3 days A FACE TO FACE EVALUATION WAS PERFORMED  Naasia Weilbacher 08/30/2020, 3:02 PM

## 2020-08-30 NOTE — Progress Notes (Addendum)
This patient was found by RN Ferrel Logan, RN) out of bed and at her doorway in her room,she was unassisted by staff, her gait was unsteady, she informed the staff she states she could not recall how she was able to walk that far without staff assistance.The patient did not call for assistance prior to getting up. According to the nurse the alarm was sounding in the patient's room.but not at the nursing station.Our facility department was immediately notified to assess the bed alarm and arrived on department. This patient was assisted to the BR by her assigned Nurses and back to bed,after facility repaired the bed alarm, She denies pain or discomfort, no apparent distress. VSS monitored, call bell placed within reach and bed alarm on moderate alarm.patient encourage to use call bell for assistance.Closely monitored,bed mat in place.

## 2020-08-31 ENCOUNTER — Inpatient Hospital Stay (HOSPITAL_COMMUNITY): Payer: Medicare Other | Admitting: Speech Pathology

## 2020-08-31 ENCOUNTER — Inpatient Hospital Stay (HOSPITAL_COMMUNITY): Payer: Medicare Other | Admitting: Occupational Therapy

## 2020-08-31 ENCOUNTER — Inpatient Hospital Stay (HOSPITAL_COMMUNITY): Payer: Medicare Other

## 2020-08-31 MED ORDER — SCOPOLAMINE 1 MG/3DAYS TD PT72
1.0000 | MEDICATED_PATCH | TRANSDERMAL | Status: DC
  Administered 2020-08-31 – 2020-09-15 (×6): 1.5 mg via TRANSDERMAL
  Filled 2020-08-31 (×7): qty 1

## 2020-08-31 NOTE — Progress Notes (Signed)
Physical Therapy Session Note  Patient Details  Name: Tara [name] MRN: [id number] Date of Birth: [birth year]  Today's Date: 08/31/2020 PT Individual Time: 0800-0900 PT Individual Time Calculation (min): 60 min   Short Term Goals: Week 1:  PT Short Term Goal 1 (Week 1): Patient will complete bed <>wc transfer with LRAD and CGA consistently PT Short Term Goal 2 (Week 1): Patient will ambulate >60ft with MinA and LRAD PT Short Term Goal 3 (Week 1): Patient will complete 1 step with LRAD and no more than ModA  Skilled Therapeutic Interventions/Progress Updates:    Patient received supine in bed asleep, agreeable to PT with encouragement. She denies pain, but endorses fatigue. Patient able to come to sitting edge of bed with CGA and verbal cueing. She remains with profound R visual field cut complicating transfer to wc from bed requiring hand over hand assist for hand placement and manual facilitation to turn to sit in wc. Patient transferring to toilet using grab bars and MinA with verbal cues to scan to R. Patient washing up at sink with Mod verbal cuing to attend to R side and scan R to see all of her ADL supplies and R sink faucet. PT propelling patient to therapy gym for time management and energy conservation. She was able to remain standing for ~5 mins completing lego building task prior to reporting dizziness. Patient reports that this dizziness felt similar to her vertigo s/s. No nystagmus noted. Patient required Max verbal and visual cuing to complete level 1 lego task. Great difficulty noted creating 3d structure from 2d picture. Patient returning to room in wc, seat belt alarm on, call light within reach, NT and daughter at bedside.   Therapy Documentation Precautions:  Precautions Precautions: Fall Precaution Comments: history of vertigo Restrictions Weight Bearing Restrictions: No    Therapy/Group: Individual Therapy  Karoline Caldwell, PT, DPT,  CBIS 08/31/2020, 7:41 AM

## 2020-08-31 NOTE — Progress Notes (Signed)
Physical Therapy Session Note  Patient Details  Name: Tara [name] MRN: [id number] Date of Birth: 30-Sep-1936  Today's Date: 08/31/2020 PT Individual Time: 1300-1327 PT Individual Time Calculation (min): 27 min   Short Term Goals: Week 1:  PT Short Term Goal 1 (Week 1): Patient will complete bed <>wc transfer with LRAD and CGA consistently PT Short Term Goal 2 (Week 1): Patient will ambulate >29ft with MinA and LRAD PT Short Term Goal 3 (Week 1): Patient will complete 1 step with LRAD and no more than ModA  Skilled Therapeutic Interventions/Progress Updates:    Patient received sitting up in wc, daughter at bedside, agreeable to PT. She denies pain at this time, but does report feeling very cold. PT propelling patient in wc to therapy gym for time management and energy conservation. She was able to ambulate 167ft with FWW and CGA making L turns with no verbal cueing for directions/avoiding obstacles. Patient ambulating additional 130ft using FWW and CGA with ModA verbal cuing for directions and avoiding obstacles when making R turns. Patient requiring verbal cues for R head turn to compensate for R visual field cut. MinA required for complete turn prior to sitting in wc. Patient returning to room in wc, seatbelt alarm on, call light within reach on L side.   Therapy Documentation Precautions:  Precautions Precautions: Fall Precaution Comments: history of vertigo Restrictions Weight Bearing Restrictions: No    Therapy/Group: Individual Therapy  Karoline Caldwell, PT, DPT, CBIS 08/31/2020, 7:39 AM
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
## 2020-08-31 NOTE — Progress Notes (Signed)
[place] PHYSICAL MEDICINE & REHABILITATION PROGRESS NOTE   Subjective/Complaints:  main c/o dizziness which has gone on for almost 60 years, had trials of meclizine, not helpful, acupuncture, vestibular therapy not helpful.  [facility]  ROS-   Pt denies SOB, abd pain, CP, N/V/C/D, and vision changes   Objective:   No results found. No results for input(s): WBC, HGB, HCT, PLT in the last 72 hours. No results for input(s): NA, K, CL, CO2, GLUCOSE, BUN, CREATININE, CALCIUM in the last 72 hours.  Intake/Output Summary (Last 24 hours) at 08/31/2020 0835 Last data filed at 08/30/2020 2200 Gross per 24 hour  Intake 740 ml  Output 200 ml  Net 540 ml        Physical Exam: Vital Signs Blood pressure (!) 156/65, pulse 64, temperature 98.1 F (36.7 C), temperature source Oral, resp. rate 18, height 5\' 5"  (1.651 m), weight 68.1 kg, SpO2 97 %.   General: No acute distress Mood and affect are appropriate Heart: Regular rate and rhythm no rubs murmurs or extra sounds Lungs: Clear to auscultation, breathing unlabored, no rales or wheezes Abdomen: Positive bowel sounds, soft nontender to palpation, nondistended Extremities: No clubbing, cyanosis, or edema Skin: No evidence of breakdown, no evidence of rash   Neurologic: Cranial nerves II through XII intact, motor strength is 4/5 in bilateral deltoid, bicep, tricep, grip, hip flexor, knee extensors, ankle dorsiflexor and plantar flexor No evidence of field cut on confrontation testing , has some extinction with double simultaneous visual stim , no evidence of vertigo  Musculoskeletal: Full range of motion in all 4 extremities. No joint swelling HENT:- has post auricular trigger points on exam B/L- slightly better    Assessment/Plan: 1. Functional deficits secondary to Left P-O ICH  which require 3+ hours per day of interdisciplinary therapy in a comprehensive inpatient rehab setting.  Physiatrist is  providing close team supervision and 24 hour management of active medical problems listed below.  Physiatrist and rehab team continue to assess barriers to discharge/monitor patient progress toward functional and medical goals  Care Tool:  Bathing    Body parts bathed by patient: Right arm, Left arm, Chest, Abdomen, Front perineal area, Buttocks, Right upper leg, Left upper leg, Face   Body parts bathed by helper: Left lower leg, Right lower leg     Bathing assist Assist Level: Minimal Assistance - Patient > 75%     Upper Body Dressing/Undressing Upper body dressing   What is the patient wearing?: Pull over shirt    Upper body assist Assist Level: Minimal Assistance - Patient > 75%    Lower Body Dressing/Undressing Lower body dressing      What is the patient wearing?: Incontinence brief     Lower body assist Assist for lower body dressing: Maximal Assistance - Patient 25 - 49%     Toileting Toileting    Toileting assist Assist for toileting: Moderate Assistance - Patient 50 - 74%     Transfers Chair/bed transfer  Transfers assist     Chair/bed transfer assist level: Minimal Assistance - Patient > 75%     Locomotion Ambulation   Ambulation assist      Assist level: Moderate Assistance - Patient 50 - 74% Assistive device: Walker-rolling Max distance: 10'   Walk 10 feet activity   Assist     Assist level: Minimal Assistance - Patient > 75% Assistive device: Walker-rolling   Walk 50 feet activity   Assist Walk 50 feet with  2 turns activity did not occur: Safety/medical concerns (patient fatigue)         Walk 150 feet activity   Assist Walk 150 feet activity did not occur: Safety/medical concerns (patient fatigue)         Walk 10 feet on uneven surface  activity   Assist Walk 10 feet on uneven surfaces activity did not occur: Safety/medical concerns (patient fatigue)         Wheelchair     Assist Will patient use  wheelchair at discharge?: No Type of Wheelchair: Manual    Wheelchair assist level: Minimal Assistance - Patient > 75% Max wheelchair distance: 150    Wheelchair 50 feet with 2 turns activity    Assist        Assist Level: Minimal Assistance - Patient > 75%   Wheelchair 150 feet activity     Assist      Assist Level: Minimal Assistance - Patient > 75%   Blood pressure (!) 156/65, pulse 64, temperature 98.1 F (36.7 C), temperature source Oral, resp. rate 18, height 5\' 5"  (1.651 m), weight 68.1 kg, SpO2 97 %.     Medical Problem List and Plan: 1.  Decreased functional mobility with aphasia secondary to left parieto-occipital acute ICH and B/L cerebral infarcts- prior leg braces worn by pt- ?AFO's             -patient may  shower             -ELOS/Goals: 10-14 days- mod I to supervision CIR, PT, OT SLP evals 2.  Antithrombotics: -DVT/anticoagulation: SCDs-  Due to [place]             -antiplatelet therapy: N/A 3. Pain Management: Tylenol as needed  10/10- tylenol prn 4. Mood: Valium 5 mg twice daily as needed anxiety  10/10- switched with Ativan which is better for vertigo-              -antipsychotic agents: N/A 5. Neuropsych: This patient is capable of making decisions on her own behalf. 6. Skin/Wound Care: Routine skin checks 7. Fluids/Electrolytes/Nutrition: Routine in and outs with follow-up chemistries 8.  Hypertension.  Cozaar 50 mg daily, Norvasc 10 mg daily.  Monitor with increased mobility Vitals:   08/30/20 2025 08/31/20 0624  BP: (!) 134/59 (!) 156/65  Pulse: 71 64  Resp: 18 18  Temp: 98.6 F (37 C) 98.1 F (36.7 C)  SpO2: 95% 97%  some lability of systolic pressure will observe for now   9.  Hypothyroidism.  TSH 1.722.  Continue Synthroid 10. Vertigo- has tried acupuncture, vestibular therapy , [facility], meclizine not helpful, has not tried scop patch, discussed with pt and daughter who are willing to  try , although given chronicity and lack of response to other anticholinergic agents expectations are low       LOS: 4 days A FACE TO FACE EVALUATION WAS PERFORMED  Charlett Blake 08/31/2020, 8:35 AM

## 2020-08-31 NOTE — Progress Notes (Signed)
Speech Language Pathology Daily Session Note  Patient Details  Name: Tara [name] MRN: [id number] Date of Birth: [birth year]  Today's Date: 08/31/2020 SLP Individual Time: 1400-1500 SLP Individual Time Calculation (min): 60 min  Short Term Goals: Week 1: SLP Short Term Goal 1 (Week 1): Patient will tolerate regular texture solids, thin liquids with mod I for swallow safety SLP Short Term Goal 2 (Week 1): Patient will maintain topic during structured conversation with modA SLP Short Term Goal 3 (Week 1): Patient will use external aids for orientation to time, situation and recall of recent events SLP Short Term Goal 4 (Week 1): Patient will demonstrate awareness to deficits during functional tasks with minA SLP Short Term Goal 5 (Week 1): Patient will perform ADL's safely with minA for safety and accuracy.  Skilled Therapeutic Interventions: Pt was seen for skilled ST targeting cognitive goals. During session pt required Mod A verbal cues to follow basic 1-step directions and sequence steps of transfers from chair to commode and chair to bed. Pt was somewhat limited by nausea and fatigue throughout session. Mod A verbal and visual cues provided for problem solving use of calendar to orient to today's date; she was independently oriented to place, self, and situation. Calendar posted in room. SLP also introduced memory notebook as compensatory strategy to improve recall of new and daily information, including therapy events and techniques, as well as safety precautions. Pt required Max A to recall how to use call bell to request assistance. SLP made RN aware of pt's continued complaints of nausea and dizziness. Pt left laying in bed with alarm set and needs within reach. Continue per current plan of care.          Pain Pain Assessment Pain Scale: 0-10 (although compained of nausea and dizziness - RN made aware) Pain Score: 0-No pain  Therapy/Group: Individual Therapy  Arbutus Leas 08/31/2020, 3:04 PM

## 2020-09-01 ENCOUNTER — Inpatient Hospital Stay (HOSPITAL_COMMUNITY): Payer: Medicare Other | Admitting: Occupational Therapy

## 2020-09-01 ENCOUNTER — Inpatient Hospital Stay (HOSPITAL_COMMUNITY): Payer: Medicare Other | Admitting: Speech Pathology

## 2020-09-01 ENCOUNTER — Encounter (HOSPITAL_COMMUNITY): Payer: Medicare Other | Admitting: Psychology

## 2020-09-01 ENCOUNTER — Inpatient Hospital Stay (HOSPITAL_COMMUNITY): Payer: Medicare Other

## 2020-09-01 DIAGNOSIS — I610 Nontraumatic intracerebral hemorrhage in hemisphere, subcortical: Secondary | ICD-10-CM

## 2020-09-01 MED ORDER — POLYETHYLENE GLYCOL 3350 17 G PO PACK
17.0000 g | PACK | Freq: Every day | ORAL | Status: DC
  Administered 2020-09-01 – 2020-09-15 (×12): 17 g via ORAL
  Filled 2020-09-01 (×16): qty 1

## 2020-09-01 NOTE — Progress Notes (Signed)
Occupational Therapy Session Note  Patient Details  Name: Tara [name] MRN: [id number] Date of Birth: 1936/02/25  Today's Date: 09/01/2020 OT Individual Time: 0901-1004 OT Individual Time Calculation (min): 63 min    Short Term Goals: Week 1:  OT Short Term Goal 1 (Week 1): Pt will complete LB bathing sit to stand with min guard assist for two consecutive sessions. OT Short Term Goal 2 (Week 1): Pt will complete LB dressing with min assist sit to stand and AE PRN. OT Short Term Goal 3 (Week 1): Pt will complete toilet transfer with min guard assist using the RW for support. OT Short Term Goal 4 (Week 1): Pt will scan right of midline with no more than min instructional cueing to avoid obstacles on the right when ambulating with the RW to the bathroom.  Skilled Therapeutic Interventions/Progress Updates:    Pt completed shower and dressing during session.  She was oriented to the month by not day of the week or day of the month when asked.  She was able to ambulate to the shower bench with min assist using the RW and mod instructional cueing to avoid objects on the right side.  She undressed with mod assist prior to shower and then needed overall min assist to complete bathing, with scanning incorporated to the right to locate soap and shampoo.  At times she also demonstrates decreased ability to locate items as lower midline as well such as when her washcloth was hanging on the grab bar in front of her left of midline, she did not see it.  Min instructional cueing needed to sequence bathing for thoroughness and safety.  She transferred out to the wheelchair with min assist and mod demonstrational cueing for dressing.  Pt with decreased processing of instructions at times as when she was trying to turn and sit on the wheelchair, she was not aligned with it.  When therapist cued her to reach back for the arms of the chair, she stated "I'm holding on to them now", but had her hands on the walker  instead.  When told to reach back and sit, she instead tried to step forward with the walker, requiring mod assist from therapist to sit down.  She demonstrated increased difficulty with orientation of clothing to donn, with completion of donning pullover shirt with increased time and supervision.  She needed mod assist overall for donning brief and pants with decreased ability to place her foot in through the waist band of the pants.  Every attempt she kept coming up short of placing it over her feet, and she demonstrated decreased awareness of this.  She needed total assist for TEDs and socks.  Finished session with pt drying her hair with the hair dryer and combing it from the wheelchair.  She was left sitting in the wheelchair with the call button and phone in reach and safety belt  In place.    Therapy Documentation Precautions:  Precautions Precautions: Fall Precaution Comments: history of vertigo Restrictions Weight Bearing Restrictions: No  Pain: Pain Assessment Pain Scale: Faces Pain Score: 0-No pain ADL: See Care Tool Section for some details of mobility and selfcare  Therapy/Group: Individual Therapy  Tara [name] OTR/L 09/01/2020, 12:25 PM

## 2020-09-01 NOTE — Consult Note (Signed)
Neuropsychological Consultation   Patient:   Tara [name]   DOB:   1936/03/10  MR Number:  [id number]  Location:  [facility] [id number] [place] [address] Dept: [place]: [id number]           Date of Service:   09/01/2020  Start Time:   3 PM End Time:   4 PM  Provider/Observer:  Ilean Skill, Psy.D.       Clinical Neuropsychologist       Billing Code/Service: 70350  Chief Complaint:    Tara [name] is a 84 year old female with history of right breast cancer 2016, TIA 2019, hypertension, hypothyroidism, recurrent UTI, bilateral TKA.  Patient was seen on rehabilitative services and 05/28/2023 lumbar radiculopathy.  Resides in an independent living facility in [place].  Patient presented on 08/15/2020 with acute onset of aphasia as well as headache.  Elevated blood pressure noted.  Cranial CT scan showed acute intraparenchymal hemorrhage centered at the right parieto-occipital region estimated volume 13 cc.  Minimal surrounding edema without significant regional mass-effect.  Underlying age-related cerebral atrophy with mild chronic small vessel ischemic disease.  MRI follow-up showed acute left occipital parietal hematoma stable.  There was also numerous remote microhemorrhages.  No mass-effect noted.  Small acute or subacute infarcts of the bilateral cerebral cortex.  Later follow-up CT scan remained stable for the left occipital hematoma with mild vasogenic edema.  The patient has continued to have a significant amount of confusion and difficulty with executive functioning.  Reason for Service:  Patient was referred for neuropsychological consultation for ongoing cognitive deficits subsequent to her cerebrovascular event.  Below is the HPI for the current admission.  HPI: Tara [name] is an 84 year old right-handed female with history of right breast cancer with lumpectomy  2016, TIA 2019 placed on aspirin therapy, hypertension, hypothyroidism,, recurrent UTI, bilateral TKA.  Well-known to rehab services 05/28/2019 to 06/19/2019 for lumbar radiculopathy.  Per chart review patient resides independent living facility in [place] and has the ability to transition to ALF if needed.  She has a daughter in the area.  Presented 08/15/2020 with acute onset of aphasia as well as headache.  Noted blood pressure 226/115.  Cranial CT scan showed acute intraparenchymal hemorrhage centered at the right parieto-occipital region estimated volume 13 cc.  Minimal surrounding edema without significant regional mass-effect.  Underlying age-related cerebral atrophy with mild chronic small vessel ischemic disease.  Initially started on 3% protocol.  MRI follow-up again noting acute left occipital parietal hematoma stable maximal dimension when compared to head CT.  Numerous remote microhemorrhages.  No masslike or concerning vascular enhancement.  Small acute or subacute infarct at the bilateral cerebral cortex.  Admission chemistries potassium 3.1, glucose 115, hemoglobin 14.9, hemoglobin A1c 5.5, urinalysis negative nitrite, SARS coronavirus negative.  EEG negative for seizure.  Echocardiogram with ejection fraction of 65 to 70% no wall motion abnormalities grade 1 diastolic dysfunction.  Latest follow-up cranial CT scan stable left occipital hematoma with mild vasogenic edema..  Tolerating mechanical soft diet.  Therapy evaluations completed and patient was admitted for a comprehensive rehab program.  Current Status:  While the patient appeared to be alert for the visit today she had significant mental status/cognition difficulties and confusion.  She was oriented to month but initially stated the year was 1921 and with prompting self corrected herself.  The patient was confused at times as to what city she was in  at times describing herself as being in [place] then at others in [place] and when  asked directly what city she was in on the third occasion stated she was in [place].  When asked her if she wanted to think more about what city she was in she self corrected to [place].  The patient told several stories about her recent medical issues in a perseverative way without apparent awareness that she had just stated that same story.  The patient was confused between the telephone and the TV remote and attempted to use the telephone for some time to figure out how to turn the TV off.  There were significant issues with confusion and awareness observed and the patient clearly needed significant cueing and mod to max assist for orientation.  Her mood and affect were quite pleasant and engaging and she did not appear to be any significant distress except for when she described the death of her husband and how she is struggled since he passed away.  The degree of how much of this confusion is due to mechanical injury from her bleed, previous cerebrovascular events and infarcts in bilateral cerebral cortex versus residual effects from accumulated blood product is unclear.  Looking back at her EMR indicates significant prior major depressive events along with pain from spinal stenosis and lumbar radiculopathy.  No prior diagnoses of dementia or other cognitive issues are found beyond neurological history of previous TIA and follow-up with neurology (Dr. Krista Blue) as well as noted prior TIA during last years CIR admission.  This does appear to be in acute status directly related to her recent CVA.  Behavioral Observation: Tara [name]  presents as a 84 y.o.-year-old Right Caucasian Female who appeared her stated age. her dress was Appropriate and she was Well Groomed and her manners were Appropriate to the situation.  her participation was indicative of Appropriate, Inattentive and Redirectable behaviors.  There were any physical disabilities noted.  she displayed an appropriate level of cooperation and  motivation.     Interactions:    Active Appropriate, Inattentive and Redirectable  Attention:   abnormal and attention span appeared shorter than expected for age  Memory:   abnormal; remote memory intact, recent memory impaired  Visuo-spatial:  not examined  Speech (Volume):  low  Speech:   normal; normal  Thought Process:  Circumstantial and Tangential  Though Content:  WNL; not suicidal and not homicidal  Orientation:   person  Judgment:   Poor  Planning:   Poor  Affect:    Appropriate  Mood:    Dysphoric  Insight:   Shallow  Intelligence:   normal   Medical History:   Past Medical History:  Diagnosis Date  . Arthritis    knees  . Breast cancer ([place]) 09/30/15   right breast  . Dependent edema   . Depression    just lost spouse in Nov. 2018  . Essential hypertension, benign 04/15/2011  . Herniated lumbar disc without myelopathy   . Hx of staphylococcal infection   . Hypertension   . Hypothyroidism   . Obesity   . Pre-diabetes   . Radicular pain   . Recurrent UTI   . Stroke [facility])    ' mini"  . Thyroid disease    hypothyroidism  . Vertigo   . Wears contact lenses    one        Psychiatric History:  Patient does have a past medical history of depression and has had difficulties with  bereavement after the death of her husband and issues with chronic pain.  Family Med/Psych History:  Family History  Problem Relation Age of Onset  . Breast cancer Sister   . Stroke Sister   . Cancer Brother        Kidney  . Heart failure Father   . Uterine cancer Sister   . Breast cancer Sister        Uterine  . Cancer Brother        Lung    Impression/DX:  Tara [name] is a 84 year old female with history of right breast cancer 2016, TIA 2019, hypertension, hypothyroidism, recurrent UTI, bilateral TKA.  Patient was seen on rehabilitative services and 05/28/2023 lumbar radiculopathy.  Resides in an independent living facility in [place].  Patient presented  on 08/15/2020 with acute onset of aphasia as well as headache.  Elevated blood pressure noted.  Cranial CT scan showed acute intraparenchymal hemorrhage centered at the right parieto-occipital region estimated volume 13 cc.  Minimal surrounding edema without significant regional mass-effect.  Underlying age-related cerebral atrophy with mild chronic small vessel ischemic disease.  MRI follow-up showed acute left occipital parietal hematoma stable.  There was also numerous remote microhemorrhages.  No mass-effect noted.  Small acute or subacute infarcts of the bilateral cerebral cortex.  Later follow-up CT scan remained stable for the left occipital hematoma with mild vasogenic edema.  The patient has continued to have a significant amount of confusion and difficulty with executive functioning.  While the patient appeared to be alert for the visit today she had significant mental status/cognition difficulties and confusion.  She was oriented to month but initially stated the year was 1921 and with prompting self corrected herself.  The patient was confused at times as to what city she was in at times describing herself as being in [place] then at others in [place] and when asked directly what city she was in on the third occasion stated she was in [place].  When asked her if she wanted to think more about what city she was in she self corrected to [place].  The patient told several stories about her recent medical issues in a perseverative way without apparent awareness that she had just stated that same story.  The patient was confused between the telephone and the TV remote and attempted to use the telephone for some time to figure out how to turn the TV off.  There were significant issues with confusion and awareness observed and the patient clearly needed significant cueing and mod to max assist for orientation.  Her mood and affect were quite pleasant and engaging and she did not appear to be any  significant distress except for when she described the death of her husband and how she is struggled since he passed away.  The degree of how much of this confusion is due to mechanical injury from her bleed, previous cerebrovascular events and infarcts in bilateral cerebral cortex versus residual effects from accumulated blood product is unclear.  Looking back at her EMR indicates significant prior major depressive events along with pain from spinal stenosis and lumbar radiculopathy.  No prior diagnoses of dementia or other cognitive issues are found beyond neurological history of previous TIA and follow-up with neurology (Dr. Krista Blue) as well as noted prior TIA during last years CIR admission.  This does appear to be in acute status directly related to her recent CVA.  It does appear that the current significant issues with executive functioning, attention  and concentration, reasoning and problem-solving, orientation and mental status are all directly related to her recent cerebrovascular events including bilateral bleeds and small bilateral infarcts.  The patient is continuing to have significant cognitive deficits and while she is quite good with her speech and is able to rely on a good sound long-term memory there is clear acute deficits with new learning, executive functioning, problem-solving, orientation etc.  The patient will continue to need mod to max assist for cognition and safety awareness issues.  Disposition/Plan:  I will follow up with the patient next week and will continue to monitor her progress recovering from her recent CVA and address issues regarding future placement and treatment planning.  Diagnosis:    Nontraumatic subcortical hemorrhage of left cerebral hemisphere [facility]) - Plan: Ambulatory referral to Neurology         Electronically Signed   _______________________ Ilean Skill, Psy.D.
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
## 2020-09-01 NOTE — Progress Notes (Signed)
[place] PHYSICAL MEDICINE & REHABILITATION PROGRESS NOTE   Subjective/Complaints: No complaints this morning. Working with SLP BP slightly elevated. Other VS stable.  Pt denies SOB, abd pain, CP, N/V/C/D, and vision changes   Objective:   No results found. No results for input(s): WBC, HGB, HCT, PLT in the last 72 hours. No results for input(s): NA, K, CL, CO2, GLUCOSE, BUN, CREATININE, CALCIUM in the last 72 hours.  Intake/Output Summary (Last 24 hours) at 09/01/2020 1249 Last data filed at 09/01/2020 1610 Gross per 24 hour  Intake 838 ml  Output 150 ml  Net 688 ml        Physical Exam: Vital Signs Blood pressure (!) 148/63, pulse 66, temperature 98.2 F (36.8 C), temperature source Oral, resp. rate 16, height 5\' 5"  (1.651 m), weight 71 kg, SpO2 100 %. General: Alert and oriented x 3, No apparent distress HEENT: Head is normocephalic, atraumatic, PERRLA, EOMI, sclera anicteric, oral mucosa pink and moist, dentition intact, ext ear canals clear,  Neck: Supple without JVD or lymphadenopathy Heart: Reg rate and rhythm. No murmurs rubs or gallops Chest: CTA bilaterally without wheezes, rales, or rhonchi; no distress Abdomen: Soft, non-tender, non-distended, bowel sounds positive. Extremities: No clubbing, cyanosis, or edema. Pulses are 2+ Skin: Clean and intact without signs of breakdown Neurologic: Cranial nerves II through XII intact, motor strength is 4/5 in bilateral deltoid, bicep, tricep, grip, hip flexor, knee extensors, ankle dorsiflexor and plantar flexor No evidence of field cut on confrontation testing , has some extinction with double simultaneous visual stim , no evidence of vertigo  Musculoskeletal: Full range of motion in all 4 extremities. No joint swelling HENT:- has post auricular trigger points on exam B/L- slightly better   Assessment/Plan: 1. Functional deficits secondary to Left P-O ICH  which require 3+ hours per day of interdisciplinary therapy  in a comprehensive inpatient rehab setting.  Physiatrist is providing close team supervision and 24 hour management of active medical problems listed below.  Physiatrist and rehab team continue to assess barriers to discharge/monitor patient progress toward functional and medical goals  Care Tool:  Bathing    Body parts bathed by patient: Right arm, Left arm, Chest, Abdomen, Front perineal area, Buttocks, Right upper leg, Left upper leg, Face   Body parts bathed by helper: Left lower leg, Right lower leg Body parts n/a: Right lower leg, Left lower leg (did not attempt)   Bathing assist Assist Level: Minimal Assistance - Patient > 75%     Upper Body Dressing/Undressing Upper body dressing   What is the patient wearing?: Pull over shirt    Upper body assist Assist Level: Supervision/Verbal cueing    Lower Body Dressing/Undressing Lower body dressing      What is the patient wearing?: Incontinence brief, Pants     Lower body assist Assist for lower body dressing: Moderate Assistance - Patient 50 - 74%     Toileting Toileting    Toileting assist Assist for toileting: Minimal Assistance - Patient > 75%     Transfers Chair/bed transfer  Transfers assist     Chair/bed transfer assist level: Minimal Assistance - Patient > 75%     Locomotion Ambulation   Ambulation assist      Assist level: Contact Guard/Touching assist Assistive device: Walker-rolling Max distance: 150   Walk 10 feet activity   Assist     Assist level: Contact Guard/Touching assist Assistive device: Walker-rolling   Walk 50 feet activity   Assist Walk 50 feet with  2 turns activity did not occur: Safety/medical concerns (patient fatigue)  Assist level: Contact Guard/Touching assist Assistive device: Walker-rolling    Walk 150 feet activity   Assist Walk 150 feet activity did not occur: Safety/medical concerns (patient fatigue)  Assist level: Contact Guard/Touching  assist Assistive device: Walker-rolling    Walk 10 feet on uneven surface  activity   Assist Walk 10 feet on uneven surfaces activity did not occur: Safety/medical concerns (patient fatigue)         Wheelchair     Assist Will patient use wheelchair at discharge?: No Type of Wheelchair: Manual    Wheelchair assist level: Minimal Assistance - Patient > 75% Max wheelchair distance: 150    Wheelchair 50 feet with 2 turns activity    Assist        Assist Level: Minimal Assistance - Patient > 75%   Wheelchair 150 feet activity     Assist      Assist Level: Minimal Assistance - Patient > 75%   Blood pressure (!) 148/63, pulse 66, temperature 98.2 F (36.8 C), temperature source Oral, resp. rate 16, height 5\' 5"  (1.651 m), weight 71 kg, SpO2 100 %.     Medical Problem List and Plan: 1.  Decreased functional mobility with aphasia secondary to left parieto-occipital acute ICH and B/L cerebral infarcts- prior leg braces worn by pt- ?AFO's             -patient may  shower             -ELOS/Goals: 10-14 days- mod I to supervision CIR, PT, OT SLP evals  -Continue CIR 2.  Antithrombotics: -DVT/anticoagulation: SCDs-  Due to [place]             -antiplatelet therapy: N/A 3. Pain Management: Tylenol as needed  10/10- tylenol prn. Well controlled 4. Mood: Valium 5 mg twice daily as needed anxiety  10/10- switched with Ativan which is better for vertigo-              -antipsychotic agents: N/A 5. Neuropsych: This patient is capable of making decisions on her own behalf. 6. Skin/Wound Care: Routine skin checks 7. Fluids/Electrolytes/Nutrition: Routine in and outs with follow-up chemistries 8.  Hypertension.  Cozaar 50 mg daily, Norvasc 10 mg daily.  Monitor with increased mobility Vitals:   08/31/20 2142 09/01/20 0359  BP: 119/62 (!) 148/63  Pulse: 64 66  Resp: 18 16  Temp: 98.3 F (36.8 C) 98.2 F (36.8 C)  SpO2: 96% 100%  10/12: BP continues to be labile.   9.  Hypothyroidism.  TSH 1.722.  Continue Synthroid 10. Vertigo- has tried acupuncture, vestibular therapy , [facility], meclizine not helpful, has not tried scop patch, discussed with pt and daughter who are willing to try , although given chronicity and lack of response to other anticholinergic agents expectations are low    -10/12: no symptoms of vertigo noted in therapy notes     LOS: 5 days A FACE TO FACE EVALUATION WAS PERFORMED  Tara [name] 09/01/2020, 12:49 PM

## 2020-09-01 NOTE — Progress Notes (Signed)
Speech Language Pathology Daily Session Note  Patient Details  Name: Tara [name] MRN: [id number] Date of Birth: August 26, 1936  Today's Date: 09/01/2020 SLP Individual Time: 1100-1155 SLP Individual Time Calculation (min): 55 min  Short Term Goals: Week 1: SLP Short Term Goal 1 (Week 1): Patient will tolerate regular texture solids, thin liquids with mod I for swallow safety SLP Short Term Goal 2 (Week 1): Patient will maintain topic during structured conversation with modA SLP Short Term Goal 3 (Week 1): Patient will use external aids for orientation to time, situation and recall of recent events SLP Short Term Goal 4 (Week 1): Patient will demonstrate awareness to deficits during functional tasks with minA SLP Short Term Goal 5 (Week 1): Patient will perform ADL's safely with minA for safety and accuracy.  Skilled Therapeutic Interventions: Pt was seen for skilled ST targeting dysphagia and cognitive goals. SLP facilitated session with an upgraded trial of regular textures, along with thin liquids. She demonstrated fully efficient mastication and oral clearance of solids. She was Mod I for use of safe swallow strategies. Min A verbal cues were required for sequencing and sustained attention to intake. Would recommend pt upgrade to regular textures, continue thin liquids, meds may be given whole with thins, pt requires full supervision during meals due to cognition which fluctuates, although she may have drinks without supervision. SLP further facilitated session with basic money management tasks using cash and coins. Pt required overall Max A multimodal cues for functional problem solving and organization to display set amounts of change, as well as calculate/display totals and change (addition and subtraction), although closer to Mod A for strictly verbal problem solving. She also required Mod A verbal repetition and written cues for working memory throughout task. She became very frustrated  during this task, as she states she used to be excellent at math and therefore skilled education provided regarding impact of CVA on problem solving skills and strategies to assist. Encouragement provided to continue participation to make progress in these areas in [place]. Pt also required Min A verbal cues and removal of visual distractions to sustain attention to tasks today. She recalled purpose of memory notebook is to "record events to help her remember" and recognized that she is having increased difficulty with this. Pt left sitting in chair with alarm set and needs within reach. Continue per current plan of care.          Pain Pain Assessment Pain Scale: 0-10 Pain Score: 0-No pain  Therapy/Group: Individual Therapy  Arbutus Leas 09/01/2020, 12:01 PM

## 2020-09-01 NOTE — Plan of Care (Signed)
  Problem: RH BOWEL ELIMINATION Goal: RH STG MANAGE BOWEL WITH ASSISTANCE Description: STG Manage Bowel with mod Assistance. Outcome: Progressing Goal: RH STG MANAGE BOWEL W/MEDICATION W/ASSISTANCE Description: STG Manage Bowel with Medication with Assistance. Outcome: Progressing   Problem: RH BLADDER ELIMINATION Goal: RH STG MANAGE BLADDER WITH ASSISTANCE Description: STG Manage Bladder With  mod Assistance Outcome: Progressing

## 2020-09-02 ENCOUNTER — Inpatient Hospital Stay (HOSPITAL_COMMUNITY): Payer: Medicare Other | Admitting: Occupational Therapy

## 2020-09-02 ENCOUNTER — Inpatient Hospital Stay (HOSPITAL_COMMUNITY): Payer: Medicare Other | Admitting: Physical Therapy

## 2020-09-02 ENCOUNTER — Inpatient Hospital Stay (HOSPITAL_COMMUNITY): Payer: Medicare Other | Admitting: Speech Pathology

## 2020-09-02 LAB — URINALYSIS, ROUTINE W REFLEX MICROSCOPIC
Bilirubin Urine: NEGATIVE
Glucose, UA: NEGATIVE mg/dL
Hgb urine dipstick: NEGATIVE
Ketones, ur: NEGATIVE mg/dL
Nitrite: POSITIVE — AB
Protein, ur: 30 mg/dL — AB
Specific Gravity, Urine: 1.009 (ref 1.005–1.030)
WBC, UA: >50 WBC/hpf — ABNORMAL HIGH (ref 0–5)
pH: 7 (ref 5.0–8.0)

## 2020-09-02 MED ORDER — NITROFURANTOIN MONOHYD MACRO 100 MG PO CAPS
100.0000 mg | ORAL_CAPSULE | Freq: Two times a day (BID) | ORAL | Status: DC
  Administered 2020-09-02 – 2020-09-03 (×4): 100 mg via ORAL
  Filled 2020-09-02 (×6): qty 1

## 2020-09-02 MED ORDER — SENNOSIDES-DOCUSATE SODIUM 8.6-50 MG PO TABS
2.0000 | ORAL_TABLET | Freq: Two times a day (BID) | ORAL | Status: DC
  Administered 2020-09-02 – 2020-09-15 (×26): 2 via ORAL
  Filled 2020-09-02 (×27): qty 2

## 2020-09-02 MED ORDER — BISACODYL 10 MG RE SUPP
10.0000 mg | Freq: Every day | RECTAL | Status: DC | PRN
  Administered 2020-09-03 – 2020-09-05 (×2): 10 mg via RECTAL
  Filled 2020-09-02 (×2): qty 1

## 2020-09-02 NOTE — Progress Notes (Signed)
Occupational Therapy Session Note  Patient Details  Name: Tara [name] MRN: [id number] Date of Birth: 02-01-1936  Today's Date: 09/02/2020 OT Individual Time: 4496-7591 OT Individual Time Calculation (min): 59 min    Short Term Goals: Week 1:  OT Short Term Goal 1 (Week 1): Pt will complete LB bathing sit to stand with min guard assist for two consecutive sessions. OT Short Term Goal 2 (Week 1): Pt will complete LB dressing with min assist sit to stand and AE PRN. OT Short Term Goal 3 (Week 1): Pt will complete toilet transfer with min guard assist using the RW for support. OT Short Term Goal 4 (Week 1): Pt will scan right of midline with no more than min instructional cueing to avoid obstacles on the right when ambulating with the RW to the bathroom.  Skilled Therapeutic Interventions/Progress Updates:    Session 1: ([id number])  Pt in bed to start session, agreeable to get to the EOB with min guard.  She demonstrated decreased orientation to year as well as day of the week.  She requested toilet transfer to start session.  RW was completed for transfer to the toilet with min assist and mod demonstrational cueing to avoid obstacles on the right side.  She sat down on the 3:1 before managing her clothing.  She was able to stand back up once cued and manage her clothing with mod assist.  Next, she ambulated out to the wheelchair with min assist using the RW for support.  Significant difficulty noted trying to manage the walker around the wheelchair with mod demonstrational cueing to align herself with the surface then sit.  She next worked on washing her hands at the sink and completion of oral hygiene.  Some motor planning difficulty noted when trying to put the toothpaste cap back on the toothpaste while holding the washcloth.  In addition, she still needed assist to turn on her electric toothbrush.  Mod instructional cueing for scanning to the right of midline for locating her water.  After  taking one sip to rinse, she poured the rest out and placed it back on the sink.  Moments later she was scanning around the sink looking for something.  When asked what she was searching for she stated a "cup of water", not being aware that she had dumped it out seconds earlier and place the empty bottle on the sink.  In addition, when trying to find her towel to wipe her mouth, she was not aware that it was resting on her chest.  Next, had her work on donning her pants.  She was able to orient them initially, but when attempting to put them on, she was unable to successfully place her foot in the pants.  She would continually pull the pants up over top of her leg without awareness that her foot had not been placed in them.  Max assist to donn them over her feet with min assist for standing to pull them up over her hips.  Finished session with pt sitting in the wheelchair with the call button and phone in reach and safety belt in place.  Pt still reporting longstanding nausea and dizziness but less intense this am compared to a couple of days ago.    Session 2: ([id number])  Pt completed toilet transfer at start of session per her request.  She used the RW for support and ambulated into the bathroom with min assist and mod demonstrational cueing to avoid the 3:1 and grab  bar on the right side.  She got the RW hung up in the leg of the 3:1 and needed therapist assist to correct it.  She again sat down once she reached the seat, instead of standing to remove her clothing.  Min questioning cueing needed for her to realize it.  When she stood to push down her pants, she instead started walking forward away from the toilet with the RW.  Therapist intervened to ask what she was doing and she stated "I don't know", not remembering why she stood up.  She was able to complete toileting tasks with min assist using the RW for support and then transfer out to the wheelchair in the center of the room.  Pt walked by the  wheelchair and then around in front of it even though she had been able to point to it on the right side initially.  Mod assist needed to back up and turn to the chair after not realizing that she had walked past it.  Therapist took her down to the ortho gym for last part of session in order to use the [place].  She utilized the lower half of the board in sitting for three 1 minute trials.  The first trial she was only able to locate 1 light in 60 seconds with therapist giving her mod instructional cueing to look to the right after many seconds.  On the second trial she was able to find lights at an average of 12 seconds, mostly because the randomness of the lights occurred more on the left compared to the right.  On the third attempt she found only three lights in 60 seconds.  Pt was cued  Before and throughout  intervals about turning her eyes and her head to the right to look for lights on the entire board and therapist assisted her with completion of this as well so she could identify the right side of the board.  Finished session with return to the room and pt left sitting up in the wheelchair in preparation for lunch.  Call button and phone in reach with safety belt in place.  Pt encouraged to keep looking to the right with all tasks, especially when she looks at things and they don't make sense or look complete.    Therapy Documentation Precautions:  Precautions Precautions: Fall Precaution Comments: history of vertigo Restrictions Weight Bearing Restrictions: No General:   Vital Signs:  Pain: Pain Assessment Pain Score: 0-No pain ADL: ADL Eating: Set up Where Assessed-Eating: Wheelchair Grooming: Supervision/safety Where Assessed-Grooming: Wheelchair, Sitting at sink Upper Body Bathing: Supervision/safety Where Assessed-Upper Body Bathing: Sitting at sink, Wheelchair Lower Body Bathing: Minimal assistance Where Assessed-Lower Body Bathing: Wheelchair, Sitting at sink, Standing at  sink Upper Body Dressing: Supervision/safety Where Assessed-Upper Body Dressing: Wheelchair, Sitting at sink Lower Body Dressing: Moderate assistance Where Assessed-Lower Body Dressing: Wheelchair, Sitting at sink, Standing at sink Toileting: Moderate assistance Where Assessed-Toileting: Glass blower/designer: Moderate assistance Toilet Transfer Method: Counselling psychologist: Raised toilet seat, Grab bars Vision   Perception    Praxis   Exercises:   Other Treatments:     Therapy/Group: Individual Therapy  Alyssha Housh OTR/L 09/02/2020, 11:04 AM

## 2020-09-02 NOTE — Progress Notes (Signed)
Patient ID: Tara [name], female   DOB: 1935-11-27, 84 y.o.   MRN: [id number]   SW called [facility] to provide updates. Due to patient living in the IL[facility] (apartments) facility does not follow care and is unable to take report. Daughter would need to call facility to report updates.   [place], [place]

## 2020-09-02 NOTE — Progress Notes (Signed)
Patient ID: Tara [name], female   DOB: Dec 11, 1935, 84 y.o.   MRN: [id number] Team Conference Report to Patient/Family  Team Conference discussion was reviewed with the patient and caregiver, including goals, any changes in plan of care and target discharge date.  Patient and caregiver express understanding and are in agreement.  The patient has a target discharge date of 09/11/20.  [name] 09/02/2020, 1:44 PM

## 2020-09-02 NOTE — Plan of Care (Signed)
  Problem: RH BOWEL ELIMINATION Goal: RH STG MANAGE BOWEL WITH ASSISTANCE Description: STG Manage Bowel with mod Assistance. Outcome: Progressing Goal: RH STG MANAGE BOWEL W/MEDICATION W/ASSISTANCE Description: STG Manage Bowel with Medication with Assistance. Outcome: Progressing   Problem: RH BLADDER ELIMINATION Goal: RH STG MANAGE BLADDER WITH ASSISTANCE Description: STG Manage Bladder With  mod Assistance Outcome: Progressing

## 2020-09-02 NOTE — Progress Notes (Signed)
Speech Language Pathology Daily Session Note  Patient Details  Name: Tara [name] MRN: [id number] Date of Birth: Aug 12, 1936  Today's Date: 09/02/2020 SLP Individual Time: 1350-1450 SLP Individual Time Calculation (min): 60 min  Short Term Goals: Week 1: SLP Short Term Goal 1 (Week 1): Patient will tolerate regular texture solids, thin liquids with mod I for swallow safety SLP Short Term Goal 2 (Week 1): Patient will maintain topic during structured conversation with modA SLP Short Term Goal 3 (Week 1): Patient will use external aids for orientation to time, situation and recall of recent events SLP Short Term Goal 4 (Week 1): Patient will demonstrate awareness to deficits during functional tasks with minA SLP Short Term Goal 5 (Week 1): Patient will perform ADL's safely with minA for safety and accuracy.  Skilled Therapeutic Interventions: Pt was seen for skilled ST targeting cognitive goals and education with pt and her daughter. Pt required Max A verbal and visual cues to use calendar to re-orient to month and date (inititially stated it was December 28). She was also highly distractible today (internally), requiring Mod-Max A verbal cues for redirection to functional tasks. She also required Max A to recall and demonstrate how to use call bell to request assistance, and Total A to verbally identify situations in which she would need to use it/needs assistance (ex: getting out of bed). Pt's daughter arrived during session and was visibly upset regarding recent news of therapists' and medical team's recommendations for pt to have 24/7 supervision and hands on care for greatest safety due to physical and severe cognitive impairments. SLP provided skilled education regarding pt's current cognitive impairments and impact on safety with concrete examples and pt's daughter in agreement/acknowledged severity of impairments. Answered all questions about therapy techniques and current functioning to her  satisfaction. Pt left laying in bed with alarm set and needs within reach, external aid about call bell posted on wall in front of her, and daughter still present. Continue per current plan of care.          Pain Pain Assessment Pain Scale: 0-10 Pain Score: 0-No pain  Therapy/Group: Individual Therapy  Arbutus Leas 09/02/2020, 2:59 PM

## 2020-09-02 NOTE — Patient Care Conference (Signed)
Inpatient RehabilitationTeam Conference and Plan of Care Update Date: 09/02/2020   Time: 10:40 AM    Patient Name: Tara [name] Record Number: [id number]  Date of Birth: [birth year] Sex: Female         Room/Bed: 4W26C/4W26C-01 Payor Info: Payor: [place] / Plan: [employer] MEDICARE / Product Type: *No Product type* /    Admit Date/Time:  08/27/2020  5:41 PM  Primary Diagnosis:  ICH (intracerebral hemorrhage) [facility])  Hospital Problems: Principal Problem:   ICH (intracerebral hemorrhage) [facility])    Expected Discharge Date: Expected Discharge Date: 09/11/20  Team Members Present: Physician leading conference: Dr. Alysia Penna Care Coodinator Present: Dorien Chihuahua, RN, BSN, CRRN;Christina Sampson Goon, [place] Nurse Present: Other (comment) Dina Rich, RN) PT Present: Stacy Gardner, PT OT Present: Clyda Greener, OT SLP Present: Jettie Booze, CF-SLP PPS Coordinator present : Ileana Ladd, Burna Mortimer, SLP     Current Status/Progress Goal Weekly Team Focus  Bowel/Bladder   Pt is continent/incontinent of bowel/bladder. LBM-08/29/2020  To become more continent of bowel/bladder.  Assess tolieting needs often, and answer call light promptly.   Swallow/Nutrition/ Hydration   Mod I, regular textures, full supervision due to cognition  mod I  Ensure tolerance of upgraded diet then goals will likely be met for swallowing   ADL's   Supervision for UB selfcare with min assist for LB bathing sit to stand.  Mod to max assist for LB dressing sit to stand as well.  Moderate right visual field deficit with some questionable motor planning deficits with understanding and following commands.  Decreased memory and awareness as well.  supervision overall  selfcare retraining, visual compensation, balance retraining, cognitive retraining, transfer training, DME education, pt/family education   Mobility   CGA bed mobility, CGA-MinA STS, MinA for transfers (d/t  visual field cut), MinA for gait using FWW up to 166f, MinA wc mobility  SPV-CGA overall  transfers, gait, dynamic balance, visual scanning   Communication   Min A word finding and functional expression of wants/needs  Supervision  word finding and expressing basic wants and needs   Safety/Cognition/ Behavioral Observations  Mod-Max  Supervision-Min  memory strategies and carryover, error awareness, functional basic problem solving, attention   Pain   No complaints of pain.  To remain pain free.  Assess pain q shift or prn.   Skin   Has old abrasion to left posterior leg  Promote healing and preventing further skin breakdown.  Assess skin q shift or prn.     Discharge Planning:  Goal to discharge back to ILF (ALF placement avaliable), family willing to hire caregivers   Team Discussion: Significant cognitive issues, confusion, poor awareness of deficits, visual field cut, apraxia,  chronic vertigo and memory affecting progress, endurance and carry over. Patient exhibits poor endurance, poor memory, poor safety awareness. UA obtained for ? UTI. BP stable andconstipation was addressed.  MD add scopolamine patch for vertigo. Patient makes excuses for errors and poor awareness of deficits or motor planning issues. She overshoots objects but denies diplopia and noticeable posterior lean when seated.  Patient on target to meet rehab goals: no, currently supervision for UB B+D, Max assist for LB B+D, min assist for transfers and gait of 150'.  LOB, impulsive yield inconsistent functional levels with goals set for supervision.   *See Care Plan and progress notes for long and short-term goals.   Revisions to Treatment Plan:   Teaching Needs: Safety measures, supervision/cues needed. Transfers,  medications, etc.  Current Barriers to Discharge: Decreased caregiver support and memory, visual field cut, vertigo, decreased safety awareness  Possible Resolutions to Barriers:  Recommend ALF/SNF  or hired caregivers; not recommending patient be left alone, needs supervision round the clock.    Medical Summary Current Status: Family goal for independant living , has cognitive deficits as outlined per neuropsych, poor orientation, dysuria and constipation  Barriers to Discharge: Medical stability   Possible Resolutions to [employer] Focus: work on visuospatial deficits RLE, RIght sensory deficits, RIght hemisensory deficits   Continued Need for Acute Rehabilitation Level of Care: The patient requires daily medical management by a physician with specialized training in physical medicine and rehabilitation for the following reasons: Direction of a multidisciplinary physical rehabilitation program to maximize functional independence : Yes Medical management of patient stability for increased activity during participation in an intensive rehabilitation regime.: Yes Analysis of laboratory values and/or radiology reports with any subsequent need for medication adjustment and/or medical intervention. : Yes   I attest that I was present, lead the team conference, and concur with the assessment and plan of the team.   Dorien Chihuahua B 09/02/2020, 3:06 PM

## 2020-09-02 NOTE — Progress Notes (Signed)
[place] PHYSICAL MEDICINE & REHABILITATION PROGRESS NOTE   Subjective/Complaints:  Appreciate neuropsych note  Pt denies SOB, abd pain, CP, N/V/C/D, and vision changes   Objective:   No results found. No results for input(s): WBC, HGB, HCT, PLT in the last 72 hours. No results for input(s): NA, K, CL, CO2, GLUCOSE, BUN, CREATININE, CALCIUM in the last 72 hours.  Intake/Output Summary (Last 24 hours) at 09/02/2020 0906 Last data filed at 09/02/2020 0839 Gross per 24 hour  Intake 386 ml  Output 650 ml  Net -264 ml        Physical Exam: Vital Signs Blood pressure (!) 130/56, pulse 67, temperature 98.4 F (36.9 C), resp. rate 17, height 5' 5"  (1.651 m), weight 69.5 kg, SpO2 95 %.  General: No acute distress Mood and affect are appropriate Heart: Regular rate and rhythm no rubs murmurs or extra sounds Lungs: Clear to auscultation, breathing unlabored, no rales or wheezes Abdomen: Positive bowel sounds, soft nontender to palpation, nondistended Extremities: No clubbing, cyanosis, or edema Skin: No evidence of breakdown, no evidence of rash  Neurologic: Cranial nerves II through XII intact, motor strength is 4/5 in bilateral deltoid, bicep, tricep, grip, hip flexor, knee extensors, ankle dorsiflexor and plantar flexor No evidence of field cut on confrontation testing , has some extinction with double simultaneous visual stim , no evidence of vertigo  Musculoskeletal: Full range of motion in all 4 extremities. No joint swelling HENT:- has post auricular trigger points on exam B/L- slightly better   Assessment/Plan: 1. Functional deficits secondary to Left P-O ICH  which require 3+ hours per day of interdisciplinary therapy in a comprehensive inpatient rehab setting.  Physiatrist is providing close team supervision and 24 hour management of active medical problems listed below.  Physiatrist and rehab team continue to assess barriers to discharge/monitor patient progress  toward functional and medical goals  Care Tool:  Bathing    Body parts bathed by patient: Right arm, Left arm, Chest, Abdomen, Front perineal area, Buttocks, Right upper leg, Left upper leg, Face   Body parts bathed by helper: Left lower leg, Right lower leg Body parts n/a: Right lower leg, Left lower leg (did not attempt)   Bathing assist Assist Level: Minimal Assistance - Patient > 75%     Upper Body Dressing/Undressing Upper body dressing   What is the patient wearing?: Pull over shirt    Upper body assist Assist Level: Supervision/Verbal cueing    Lower Body Dressing/Undressing Lower body dressing      What is the patient wearing?: Incontinence brief, Pants     Lower body assist Assist for lower body dressing: Moderate Assistance - Patient 50 - 74%     Toileting Toileting    Toileting assist Assist for toileting: Minimal Assistance - Patient > 75%     Transfers Chair/bed transfer  Transfers assist     Chair/bed transfer assist level: Minimal Assistance - Patient > 75%     Locomotion Ambulation   Ambulation assist      Assist level: Contact Guard/Touching assist Assistive device: Walker-rolling Max distance: 150   Walk 10 feet activity   Assist     Assist level: Contact Guard/Touching assist Assistive device: Walker-rolling   Walk 50 feet activity   Assist Walk 50 feet with 2 turns activity did not occur: Safety/medical concerns (patient fatigue)  Assist level: Contact Guard/Touching assist Assistive device: Walker-rolling    Walk 150 feet activity   Assist Walk 150 feet activity did not occur:  Safety/medical concerns (patient fatigue)  Assist level: Contact Guard/Touching assist Assistive device: Walker-rolling    Walk 10 feet on uneven surface  activity   Assist Walk 10 feet on uneven surfaces activity did not occur: Safety/medical concerns (patient fatigue)         Wheelchair     Assist Will patient use  wheelchair at discharge?: No Type of Wheelchair: Manual    Wheelchair assist level: Minimal Assistance - Patient > 75% Max wheelchair distance: 150    Wheelchair 50 feet with 2 turns activity    Assist        Assist Level: Minimal Assistance - Patient > 75%   Wheelchair 150 feet activity     Assist      Assist Level: Minimal Assistance - Patient > 75%   Blood pressure (!) 130/56, pulse 67, temperature 98.4 F (36.9 C), resp. rate 17, height 5' 5"  (1.651 m), weight 69.5 kg, SpO2 95 %.     Medical Problem List and Plan: 1.  Decreased functional mobility with aphasia secondary to left parieto-occipital acute ICH and B/L cerebral infarcts- prior leg braces worn by pt- ?AFO's             -patient may  shower             -ELOS/Goals: 10-14 days- mod I to supervision Team conference today please see physician documentation under team conference tab, met with team  to discuss problems,progress, and goals. Formulized individual treatment plan based on medical history, underlying problem and comorbidities.   -Continue CIR 2.  Antithrombotics: -DVT/anticoagulation: SCDs-  Due to [place]             -antiplatelet therapy: N/A 3. Pain Management: Tylenol as needed  10/10- tylenol prn. Well controlled 4. Mood: Valium 5 mg twice daily as needed anxiety  10/10- switched with Ativan which is better for vertigo-              -antipsychotic agents: N/A 5. Neuropsych: This patient is capable of making decisions on her own behalf. 6. Skin/Wound Care: Routine skin checks 7. Fluids/Electrolytes/Nutrition: Routine in and outs with follow-up chemistries 8.  Hypertension.  Cozaar 50 mg daily, Norvasc 10 mg daily.  Monitor with increased mobility Vitals:   09/01/20 2004 09/02/20 0632  BP: (!) 146/63 (!) 130/56  Pulse: 63 67  Resp: 16 17  Temp: 98.9 F (37.2 C) 98.4 F (36.9 C)  SpO2: 98% 95%  10/12: BP continues to be labile.  9.  Hypothyroidism.  TSH 1.722.  Continue Synthroid 10.  Vertigo- has tried acupuncture, vestibular therapy , [facility], meclizine not helpful, has not tried scop patch, discussed with pt and daughter who are willing to try , although given chronicity and lack of response to other anticholinergic agents expectations are low    -10/13- per OT, dizziness has partially improved      LOS: 6 days A FACE TO FACE EVALUATION WAS PERFORMED  Charlett Blake 09/02/2020, 9:06 AM

## 2020-09-02 NOTE — Progress Notes (Signed)
Physical Therapy Session Note  Patient Details  Name: Tara [name] MRN: [id number] Date of Birth: November 11, 1936  Today's Date: 09/02/2020 PT Individual Time: 1300-1345 PT Individual Time Calculation (min): 45 min   Short Term Goals: Week 1:  PT Short Term Goal 1 (Week 1): Patient will complete bed <>wc transfer with LRAD and CGA consistently PT Short Term Goal 2 (Week 1): Patient will ambulate >52ft with MinA and LRAD PT Short Term Goal 3 (Week 1): Patient will complete 1 step with LRAD and no more than ModA  Skilled Therapeutic Interventions/Progress Updates:    pt received in [facility] with NCT and agreeable to therapy. Pt requesting to use restroom, taken to toilet in [facility], directed in stand pivot transfer to toilet with (+) use of grab bar, min A and single step cues. Pt able to have (+)bladder void. Pt able to complete hygiene setup assist. Pt directed in stand pivot transfer with use of grab bar to WC, min A. Pt taken to sink in WC, directed in hand washing at setup with VC for sequencing, object use and identification. Pt taken to hall for gait training with FWW, min A for STS to [place], 130' min A for stability, VC for increased stride length, trunk extension, walker proximity and safety awareness. Pt required single one step commands and frequent VC for redirection and attention to task. Pt required sitting rest break at this distance, min A for safety and returned to gym total A for energy conservation. Pt directed in STS to FWW min A, and directed in x5 cone retrievals all on R side, pt required max VC to direct head and eyes to R to attend to cones as she ambulated passed x2, VC to stop and turn head to see them; pt's balanced regressed to mod A with this and pt's safety awareness decreased to max VC for walker use/proximity. Pt ultimately unable to complete task and required to be redirected to chair on pt's L side to sit for safety. Pt required prolonged rest break without trunk being supported, PT  directed pt in attending to objects on R side with identification in gym during unsupported sitting, pt required max VC to complete. Pt then directed in gait to [facility] with FWW 20' min A and mod A to turn walker to R and side safely. Pt returned to room in [facility] for time; directed in STS and x5 lateral steps along bed to HOB min A and sit>supine CGA. Pt left in bed, alarm set, All needs in reach and in good condition. Call light in hand.  PT also educated pt on call light, help button, and how to use room phone. Pt able to verbalize correct use back at end of session.   Therapy Documentation Precautions:  Precautions Precautions: Fall Precaution Comments: history of vertigo Restrictions Weight Bearing Restrictions: No   Pain: Pain Assessment Pain Scale: 0-10 Pain Score: 0-No pain    Therapy/Group: Individual Therapy  Junie Panning 09/02/2020, 3:08 PM

## 2020-09-03 ENCOUNTER — Inpatient Hospital Stay (HOSPITAL_COMMUNITY): Payer: Medicare Other

## 2020-09-03 ENCOUNTER — Inpatient Hospital Stay (HOSPITAL_COMMUNITY): Payer: Medicare Other | Admitting: Speech Pathology

## 2020-09-03 ENCOUNTER — Inpatient Hospital Stay (HOSPITAL_COMMUNITY): Payer: Medicare Other | Admitting: *Deleted

## 2020-09-03 ENCOUNTER — Inpatient Hospital Stay (HOSPITAL_COMMUNITY): Payer: Medicare Other | Admitting: Occupational Therapy

## 2020-09-03 LAB — GLUCOSE, CAPILLARY: Glucose-Capillary: 97 mg/dL (ref 70–99)

## 2020-09-03 MED ORDER — LOSARTAN POTASSIUM 50 MG PO TABS
100.0000 mg | ORAL_TABLET | Freq: Every day | ORAL | Status: DC
  Administered 2020-09-04 – 2020-09-15 (×12): 100 mg via ORAL
  Filled 2020-09-03 (×12): qty 2

## 2020-09-03 MED ORDER — BLOOD PRESSURE CONTROL BOOK
Freq: Once | Status: AC
  Filled 2020-09-03: qty 1

## 2020-09-03 NOTE — Progress Notes (Signed)
Patient ID: Shon Hale, female   DOB: 10-02-1936, 84 y.o.   MRN: [id number]   SW received phone call from pt daughter ([name]). [name] has updated SW that ILF is willing to transition patient to ALF with additional 24/7 assistance so that pt can remain at their facility (per patient and family request). Sw has requested that daughter have someone from facility give SW a call to confirm this information as well.   Sw received phone call from Rod Can from [facility]. Santiago Glad has confirmed this transfer is available per patient progress and care needs. Santiago Glad has informed SW that supervision will be 24/7 around the clock care by CNA staff and medications are provided by Conservation officer, [facility] buildings. Santiago Glad is requesting nursing, therapy and physician notes. Sw has provided this information to facility. Santiago Glad will meet with her care management nurse on Monday and both are requesting a in-person/virtual assessment of patient before transfer. SW will schedule visit and await facility decision.   Tomi Likens [phone] (Phone) [phone] (fax)  [place] Qu[place], [place] [id number]

## 2020-09-03 NOTE — Progress Notes (Signed)
Speech Language Pathology Daily Session Note  Patient Details  Name: Tara [name] MRN: [id number] Date of Birth: [name] 26, 1936  Today's Date: 09/03/2020 SLP Individual Time: 1015-1057 SLP Individual Time Calculation (min): 42 min  Short Term Goals: Week 1: SLP Short Term Goal 1 (Week 1): Patient will tolerate regular texture solids, thin liquids with mod I for swallow safety SLP Short Term Goal 2 (Week 1): Patient will maintain topic during structured conversation with modA SLP Short Term Goal 3 (Week 1): Patient will use external aids for orientation to time, situation and recall of recent events SLP Short Term Goal 4 (Week 1): Patient will demonstrate awareness to deficits during functional tasks with minA SLP Short Term Goal 5 (Week 1): Patient will perform ADL's safely with minA for safety and accuracy.  Skilled Therapeutic Interventions: Pt was seen for skilled ST targeting cognitive-linguistic skills. Pt required Max A verbal and visual cues for any functional use of her memory notebook as compensatory strategy to accurately recall daily information from just 15 minutes prior to ST's arrival. She required Max A multimodal cueing for visual scanning and interpreting medication labels during a basic medication management task. Moderate cues required for sustained attention, recall, and a problem solving strategy within a verbal task in which she had to identify 2-3 differences between photograph scenes. She continues to have poor frustration tolerance when she does not perform well and Max-Total A for awareness of acute cognitive deficits in context of CVA. Pt left sitting in chair with alarm set and needs within reach. Continue per current plan of care.          Pain Pain Assessment Pain Scale: 0-10 Pain Score: 0-No pain  Therapy/Group: Individual Therapy  Arbutus Leas 09/03/2020, 12:06 PM

## 2020-09-03 NOTE — Plan of Care (Signed)
  Problem: RH Simple Meal Prep Goal: LTG Patient will perform simple meal prep w/assist (OT) Description: LTG: Patient will perform simple meal prep with assistance, with/without cues (OT). Outcome: Not Applicable   Problem: RH Balance Goal: LTG Patient will maintain dynamic standing with ADLs (OT) Description: LTG:  Patient will maintain dynamic standing balance with assist during activities of daily living (OT)  Flowsheets (Taken 09/03/2020 1039) LTG: Pt will maintain dynamic standing balance during ADLs with: (goal downgraded based on slower progress than expected) Contact Guard/Touching assist Note: goal downgraded based on slower progress than expected   Problem: RH Bathing Goal: LTG Patient will bathe all body parts with assist levels (OT) Description: LTG: Patient will bathe all body parts with assist levels (OT) Flowsheets (Taken 09/03/2020 1039) LTG: Pt will perform bathing with assistance level/cueing: (goal downgraded based on slower progress than expected) Contact Guard/Touching assist Note: goal downgraded based on slower progress than expected   Problem: RH Dressing Goal: LTG Patient will perform lower body dressing w/assist (OT) Description: LTG: Patient will perform lower body dressing with assist, with/without cues in positioning using equipment (OT) Flowsheets (Taken 09/03/2020 1039) LTG: Pt will perform lower body dressing with assistance level of: (goal downgraded based on slower progress than expected) Minimal Assistance - Patient > 75% Note: goal downgraded based on slower progress than expected   Problem: RH Toileting Goal: LTG Patient will perform toileting task (3/3 steps) with assistance level (OT) Description: LTG: Patient will perform toileting task (3/3 steps) with assistance level (OT)  Flowsheets (Taken 09/03/2020 1039) LTG: Pt will perform toileting task (3/3 steps) with assistance level: (goal downgraded based on slower progress than expected) Contact  Guard/Touching assist Note: goal downgraded based on slower progress than expected   Problem: RH Toilet Transfers Goal: LTG Patient will perform toilet transfers w/assist (OT) Description: LTG: Patient will perform toilet transfers with assist, with/without cues using equipment (OT) Flowsheets (Taken 09/03/2020 1039) LTG: Pt will perform toilet transfers with assistance level of: (goal downgraded based on slower progress than expected) Contact Guard/Touching assist Note: goal downgraded based on slower progress than expected   Problem: RH Tub/Shower Transfers Goal: LTG Patient will perform tub/shower transfers w/assist (OT) Description: LTG: Patient will perform tub/shower transfers with assist, with/without cues using equipment (OT) Flowsheets (Taken 09/03/2020 1039) LTG: Pt will perform tub/shower stall transfers with assistance level of: (goal downgraded based on slower progress than expected) Contact Guard/Touching assist LTG: Pt will perform tub/shower transfers from: Walk in shower

## 2020-09-03 NOTE — Progress Notes (Signed)
Occupational Therapy Weekly Progress Note  Patient Details  Name: Tara [name] MRN: [id number] Date of Birth: 1936-06-22  Beginning of progress report period: August 28, 2020 End of progress report period: September 03, 2020  Today's Date: 09/03/2020 OT Individual Time: 4650-3546 OT Individual Time Calculation (min): 59 min    Patient has met 0 of 4 short term goals.  Ms. Sisk continues to make slower progress than expected with therapy at this time.  She is able to complete UB selfcare with supervision but needs min assist for LB bathing with max assist for LB dressing tasks, secondary to decreased flexibility, motor planning deficits, as well as a significant right visual field deficit.  She continues to need min assist for sit to stand, but at some times can complete this at min guard.  She is able to complete toilet transfers and toileting tasks as well at overall min assist.  With functional mobility with use of the RW, she needs max instructional cueing to avoid obstacles on the right side as she will frequently run into them with her RW.  She demonstrates impaired awareness into her visual deficits and at times states she would do better if I was having her play bridge or look at insurance forms instead of visual tasks that therapist has used.  Ms. Delahunt continues with decreased memory as well as decreased safety awareness.  Because of these deficits and slower progress, feel she will benefit from continued CIR level therapy with recommendations of 24 hour assist at discharge.  Family will have to arrange this as she was at an independent living facility prior to this.  Planned discharge is for 10/22.        Patient continues to demonstrate the following deficits: muscle weakness, field cut and hemianopsia, decreased attention to right and decreased motor planning, decreased attention, decreased awareness, decreased problem solving, decreased safety awareness, decreased memory and delayed  processing and decreased standing balance and decreased balance strategies and therefore will continue to benefit from skilled OT intervention to enhance overall performance with BADL and Reduce care partner burden.  Patient not progressing toward long term goals.  See goal revision..  Continue plan of care.  OT Short Term Goals Week 2:  OT Short Term Goal 1 (Week 2): Continue working on established LTGs until discharge.  Skilled Therapeutic Interventions/Progress Updates:    Pt pleasant this am, using bed pan from nursing placing suppository earlier.  Therapist assisted with cleaning her up in sidelying and then she transitioned to sitting with min guard.  She voiced the feeling of having to go to the bathroom, so transfer to the toilet was completed with min assist using the RW.  Min instructional cueing for avoiding objects on the right side.  She was able to void but did not have any further BM.  Min assist for toileting hygiene with transfer out to the wheelchair at min assist to work on washing up a little.  She was able to complete washing most of her body with supervision except for her feet and her peri area.  Min assist was needed for sit to stand as well as balance when washing as she would fall posteriorly.  Encouraged scanning to the right side of midline for locating washcloth and soap when bathing.  She needed max assist for LB dressing secondary to not being able to place her feet in the waistband of the pants and down the legs, secondary to visual perceptual issues.  She stood and  pulled them over her hips with overall min assist and increased time.  Therapist assisted with donning her TEDs and gripper socks.  Finished session with pt sitting in the wheelchair with the call button and phone in reach and safety belt in place.  Pt was oriented to place, month, day of the week, this session but not day of the month.    Therapy Documentation Precautions:  Precautions Precautions:  Fall Precaution Comments: history of vertigo Restrictions Weight Bearing Restrictions: No  Pain: Pain Assessment Pain Scale: Faces Pain Score: 0-No pain ADL: See Care Tool Section for some details of mobility and selfcare  Therapy/Group: Individual Therapy  Angel Hobdy OTR/L 09/03/2020, 10:21 AM

## 2020-09-03 NOTE — Progress Notes (Signed)
Physical Therapy Session Note  Patient Details  Name: Tara [name] MRN: [id number] Date of Birth: 1936/04/05  Today's Date: 09/03/2020 PT Individual Time: 9021-1155 PT Individual Time Calculation (min): 66 min   Short Term Goals: Week 1:  PT Short Term Goal 1 (Week 1): Patient will complete bed <>wc transfer with LRAD and CGA consistently PT Short Term Goal 2 (Week 1): Patient will ambulate >76ft with MinA and LRAD PT Short Term Goal 3 (Week 1): Patient will complete 1 step with LRAD and no more than ModA  Skilled Therapeutic Interventions/Progress Updates:    Patient received sitting up in wc, agreeable to PT. She reports mild back pain, but relates it to sitting in wc for "far too long." Patient able to complete dynavision while standing, reaching outside BOS and attending to R side with greater frequency and accuracy. Average reaction time over 3 trials for all 4 quadrants: 4.44s. Patient with occasional dysmetria with R UE reach for red light requiring verbal cues to correct. Patient able to unlock/lock R brake with verbal cuing as well. She ambulated ~129ft using RW and CGA to complete visual scanning task for cones around the gym. With this task, increased difficulty scanning R noted and increased motor planning difficulty noted. Often she would walk into objects and not know how to regroup and walk around them without PT intervention. Patient becoming verbally frustrated at times with PT intervention, but would not be able to correct problem herself. Patient completing standing toe taps onto 6" box with RW and CGA 2x40. Standing cog task with RW and alphabetizing letters on table. Max verbal cuing needed to alphabetize letters. Patient requesting to use bathroom upon returning to room. She initially began walking into bathroom door, instead of through doorway, until PT intervened and moved walker into correct direction. Patient beginning to sit on toilet when toilet wasn't behind her  requiring PT to facilitate lateral stepping to be in front of toilet. Patient with significant difficulty figuring out how to put soiled toilet paper into toilet bowl while standing in front of it. Patient ambulated back to bed with CGA and verbal cues for directions. Supervision for bed mobility. Bed alarm on, call light within reach.   Therapy Documentation Precautions:  Precautions Precautions: Fall Precaution Comments: history of vertigo Restrictions Weight Bearing Restrictions: No   Therapy/Group: Individual Therapy  Karoline Caldwell, PT, DPT, CBIS 09/03/2020, 4:00 PM

## 2020-09-03 NOTE — Progress Notes (Signed)
[facility] PHYSICAL MEDICINE & REHABILITATION PROGRESS NOTE   Subjective/Complaints:  Appreciate SW note, discussed with pt she would not be safe for IL level   Pt denies SOB, abd pain, CP, N/V/C/D, and vision changes   Objective:   No results found. No results for input(s): WBC, HGB, HCT, PLT in the last 72 hours. No results for input(s): NA, K, CL, CO2, GLUCOSE, BUN, CREATININE, CALCIUM in the last 72 hours.  Intake/Output Summary (Last 24 hours) at 09/03/2020 0754 Last data filed at 09/02/2020 1951 Gross per 24 hour  Intake 413 ml  Output 150 ml  Net 263 ml        Physical Exam: Vital Signs Blood pressure (!) 162/68, pulse (!) 57, temperature 97.6 F (36.4 C), temperature source Oral, resp. rate 15, height 5\' 5"  (1.651 m), weight 69.6 kg, SpO2 97 %.  General: No acute distress Mood and affect are appropriate Heart: Regular rate and rhythm no rubs murmurs or extra sounds Lungs: Clear to auscultation, breathing unlabored, no rales or wheezes Abdomen: Positive bowel sounds, soft nontender to palpation, nondistended Extremities: No clubbing, cyanosis, or edema Skin: No evidence of breakdown, no evidence of rash    Neurologic: Cranial nerves II through XII intact, motor strength is 4/5 in bilateral deltoid, bicep, tricep, grip, hip flexor, knee extensors, ankle dorsiflexor and plantar flexor No evidence of field cut on confrontation testing , has some extinction with double simultaneous visual stim , no evidence of vertigo  Musculoskeletal: Full range of motion in all 4 extremities. No joint swelling HENT:- has post auricular trigger points on exam B/L- slightly better   Assessment/Plan: 1. Functional deficits secondary to Left P-O ICH  which require 3+ hours per day of interdisciplinary therapy in a comprehensive inpatient rehab setting.  Physiatrist is providing close team supervision and 24 hour management of active medical problems listed below.  Physiatrist  and rehab team continue to assess barriers to discharge/monitor patient progress toward functional and medical goals  Care Tool:  Bathing    Body parts bathed by patient: Right arm, Left arm, Chest, Abdomen, Front perineal area, Buttocks, Right upper leg, Left upper leg, Face   Body parts bathed by helper: Left lower leg, Right lower leg Body parts n/a: Right lower leg, Left lower leg (did not attempt)   Bathing assist Assist Level: Minimal Assistance - Patient > 75%     Upper Body Dressing/Undressing Upper body dressing   What is the patient wearing?: Pull over shirt    Upper body assist Assist Level: Supervision/Verbal cueing    Lower Body Dressing/Undressing Lower body dressing      What is the patient wearing?: Pants     Lower body assist Assist for lower body dressing: Moderate Assistance - Patient 50 - 74%     Toileting Toileting    Toileting assist Assist for toileting: Moderate Assistance - Patient 50 - 74%     Transfers Chair/bed transfer  Transfers assist     Chair/bed transfer assist level: Minimal Assistance - Patient > 75%     Locomotion Ambulation   Ambulation assist      Assist level: Contact Guard/Touching assist Assistive device: Walker-rolling Max distance: 150   Walk 10 feet activity   Assist     Assist level: Contact Guard/Touching assist Assistive device: Walker-rolling   Walk 50 feet activity   Assist Walk 50 feet with 2 turns activity did not occur: Safety/medical concerns (patient fatigue)  Assist level: Contact Guard/Touching assist Assistive device: Walker-rolling  Walk 150 feet activity   Assist Walk 150 feet activity did not occur: Safety/medical concerns (patient fatigue)  Assist level: Contact Guard/Touching assist Assistive device: Walker-rolling    Walk 10 feet on uneven surface  activity   Assist Walk 10 feet on uneven surfaces activity did not occur: Safety/medical concerns (patient  fatigue)         Wheelchair     Assist Will patient use wheelchair at discharge?: No Type of Wheelchair: Manual    Wheelchair assist level: Minimal Assistance - Patient > 75% Max wheelchair distance: 150    Wheelchair 50 feet with 2 turns activity    Assist        Assist Level: Minimal Assistance - Patient > 75%   Wheelchair 150 feet activity     Assist      Assist Level: Minimal Assistance - Patient > 75%   Blood pressure (!) 162/68, pulse (!) 57, temperature 97.6 F (36.4 C), temperature source Oral, resp. rate 15, height 5\' 5"  (1.651 m), weight 69.6 kg, SpO2 97 %.     Medical Problem List and Plan: 1.  Decreased functional mobility with aphasia secondary to left parieto-occipital acute ICH and B/L cerebral infarcts- prior leg braces worn by pt- ?AFO's             -patient may  shower             -ELOS/Goals: 10-14 days- mod I to supervision   -Continue CIR 2.  Antithrombotics: -DVT/anticoagulation: SCDs-  Due to [place]             -antiplatelet therapy: N/A 3. Pain Management: Tylenol as needed  10/10- tylenol prn. Well controlled 4. Mood: Valium 5 mg twice daily as needed anxiety  10/10- switched with Ativan which is better for vertigo-              -antipsychotic agents: N/A 5. Neuropsych: This patient is capable of making decisions on her own behalf. 6. Skin/Wound Care: Routine skin checks 7. Fluids/Electrolytes/Nutrition: Routine in and outs with follow-up chemistries 8.  Hypertension.  Cozaar 50 mg daily, Norvasc 10 mg daily.  Monitor with increased mobility Vitals:   09/02/20 1942 09/03/20 0559  BP: (!) 154/62 (!) 162/68  Pulse: (!) 59 (!) 57  Resp: 17 15  Temp: 98 F (36.7 C) 97.6 F (36.4 C)  SpO2: 97% 97%  10/12: BP continues to be labile. Increase cozaar  9.  Hypothyroidism.  TSH 1.722.  Continue Synthroid 10. Vertigo- has tried acupuncture, vestibular therapy , [facility], meclizine not  helpful, has not tried scop patch, discussed with pt and daughter who are willing to try , although given chronicity and lack of response to other anticholinergic agents expectations are low    -10/13- per OT, dizziness has partially improved   11.  UTI on Macrobid await Ucx    LOS: 7 days A FACE TO FACE EVALUATION WAS PERFORMED  Charlett Blake 09/03/2020, 7:54 AM

## 2020-09-03 NOTE — Plan of Care (Signed)
  Problem: RH BOWEL ELIMINATION Goal: RH STG MANAGE BOWEL WITH ASSISTANCE Description: STG Manage Bowel with mod Assistance. Outcome: Progressing Goal: RH STG MANAGE BOWEL W/MEDICATION W/ASSISTANCE Description: STG Manage Bowel with Medication with Assistance. Outcome: Progressing   Problem: RH BLADDER ELIMINATION Goal: RH STG MANAGE BLADDER WITH ASSISTANCE Description: STG Manage Bladder With  mod Assistance Outcome: Progressing

## 2020-09-03 NOTE — Progress Notes (Signed)
Physical Therapy Session Note  Patient Details  Name: Tara [name] MRN: [id number] Date of Birth: 12-15-1935  Today's Date: 09/03/2020 PT Individual Time: 1129-1200 PT Individual Time Calculation (min): 31 min   Short Term Goals: Week 1:  PT Short Term Goal 1 (Week 1): Patient will complete bed <>wc transfer with LRAD and CGA consistently PT Short Term Goal 2 (Week 1): Patient will ambulate >90ft with MinA and LRAD PT Short Term Goal 3 (Week 1): Patient will complete 1 step with LRAD and no more than ModA  Skilled Therapeutic Interventions/Progress Updates:    Patient received sitting up in wc, agreeable to PT. She denies pain. Patient found watching TV static stating that the hospital does no have the channel she wants. This PT asked patient to put in the number of the channel she wants (12) and patient was found to be adjusting the volume and not putting the channel number in. PT re-orientating patient to use of TV remote and call light. She was able attend to R side on dynavision with reaction times as follows: L 3.74, R 3.55 and 2nd trial: L 3.55, R 3.60. Patient demonstrating greater ability to attend to R side and scan to compensate for any visual field cut without verbal cuing or prompting. Patient able to unlock R break as well. Patient returning to room in wc, seatbelt alarm on, call light within reach.   Therapy Documentation Precautions:  Precautions Precautions: Fall Precaution Comments: history of vertigo Restrictions Weight Bearing Restrictions: No    Therapy/Group: Individual Therapy  Karoline Caldwell, PT, DPT, CBIS 09/03/2020, 7:47 AM

## 2020-09-03 NOTE — Progress Notes (Signed)
This  Nurse offered the pt the suppository. Pt refused and stated she is very tired and would prefer to do it later in the day. This nurse educated the pt, pt continued to refuse.

## 2020-09-03 NOTE — Evaluation (Signed)
Recreational Therapy Assessment and Plan  Patient Details  Name: Tara [name] MRN: [id number] Date of Birth: 01-31-1936 Today's Date: 09/03/2020  Rehab Potential:  good ELOS:   10/22  Hospital Problem: Principal Problem:   ICH (intracerebral hemorrhage) ([place])   Past Medical History:      Past Medical History:  Diagnosis Date  . Arthritis    knees  . Breast cancer ([place]) 09/30/15   right breast  . Dependent edema   . Depression    just lost spouse in Nov. 2018  . Essential hypertension, benign 04/15/2011  . Herniated lumbar disc without myelopathy   . Hx of staphylococcal infection   . Hypertension   . Hypothyroidism   . Obesity   . Pre-diabetes   . Radicular pain   . Recurrent UTI   . Stroke [facility])    ' mini"  . Thyroid disease    hypothyroidism  . Vertigo   . Wears contact lenses    one   Past Surgical History:       Past Surgical History:  Procedure Laterality Date  . ANKLE SURGERY  1994   RIGHT  . BACK SURGERY    . BREAST LUMPECTOMY WITH NEEDLE LOCALIZATION Right 09/30/2015   Procedure: RIGHT BREAST LUMPECTOMY WITH NEEDLE LOCALIZATION;  Surgeon: Fanny Skates, MD;  Location: [facility];  Service: General;  Laterality: Right;  . BREAST SURGERY    . COLONOSCOPY W/ BIOPSIES AND POLYPECTOMY    . EYE SURGERY     bilateral  . HAND SURGERY  1951   RIGHT HAND  . JOINT REPLACEMENT     rt shoulder rotator cuff  . KNEE ARTHROSCOPY     left knee  . LUMBAR LAMINECTOMY/DECOMPRESSION MICRODISCECTOMY Right 01/11/2013   Procedure: LUMBAR LAMINECTOMY/DECOMPRESSION MICRODISCECTOMY 1 LEVEL,Right Lumbar Three-Four;  Surgeon: Elaina Hoops, MD;  Location: [facility];  Service: Neurosurgery;  Laterality: Right;  . ROTATOR CUFF REPAIR  2011   RIGHT  . TONSILLECTOMY    . TOTAL KNEE ARTHROPLASTY     Left  . TOTAL KNEE ARTHROPLASTY Right 12/25/2017   Procedure: TOTAL KNEE ARTHROPLASTY;  Surgeon: Vickey Huger,  MD;  Location: [facility];  Service: Orthopedics;  Laterality: Right;    Assessment & Plan Clinical Impression: Patient is a 84 y.o. year old female with recent admission to the hospital on 08/15/2020 with acute onset of aphasia as well as headache. Noted blood pressure 226/115. Cranial CT scan showed acute intraparenchymal hemorrhage centered at the right parieto-occipital region estimated volume 13 cc. Minimal surrounding edema without significant regional mass-effect. Underlying age-related cerebral atrophy with mild chronic small vessel ischemic disease. Initially started on 3% protocol. MRI follow-up again noting acute left occipital parietal hematoma stable maximal dimension when compared to head CT. Numerous remote microhemorrhages. No masslike or concerning vascular enhancement. Small acute or subacute infarct at the bilateral cerebral cortex.  Patient transferred to CIR on 08/27/2020 .    Pt presents with decreased activity tolerance, decreased functional mobility, decreased balance, decreased awareness, decreased problem solving, decreased safety, decreased memory Limiting pt's independence with leisure/community pursuits.   Plan  Min 1 TR session >20 minutes per week during LOS  Recommendations for other services: Neuropsych  Discharge Criteria: Patient will be discharged from TR if patient refuses treatment 3 consecutive times without medical reason.  If treatment goals not met, if there is a change in medical status, if patient makes no progress towards goals or if patient is discharged from hospital.  The  above assessment, treatment plan, treatment alternatives and goals were discussed and mutually agreed upon: by patient  [place] 09/03/2020, 4:18 PM

## 2020-09-03 NOTE — Progress Notes (Signed)
Speech Language Pathology Weekly Progress and Session Note  Patient Details  Name: Tara [name] MRN: [id number] Date of Birth: [birth year]  Beginning of progress report period: August 28, 2020 End of progress report period: September 04, 2020  Today's Date: 09/04/2020 SLP Individual Time: 1400-1455 SLP Individual Time Calculation (min): 55 min  Short Term Goals: Week 1: SLP Short Term Goal 1 (Week 1): Patient will tolerate regular texture solids, thin liquids with mod I for swallow safety SLP Short Term Goal 1 - Progress (Week 1): Met SLP Short Term Goal 2 (Week 1): Patient will maintain topic during structured conversation with modA SLP Short Term Goal 2 - Progress (Week 1): Met SLP Short Term Goal 3 (Week 1): Patient will use external aids for orientation to time, situation and recall of recent events SLP Short Term Goal 3 - Progress (Week 1): Met SLP Short Term Goal 4 (Week 1): Patient will demonstrate awareness to deficits during functional tasks with minA SLP Short Term Goal 4 - Progress (Week 1): Progressing toward goal SLP Short Term Goal 5 (Week 1): Patient will perform ADL's safely with minA for safety and accuracy. SLP Short Term Goal 5 - Progress (Week 1): Progressing toward goal    New Short Term Goals: Week 2: SLP Short Term Goal 1 (Week 2): STG=LTG due to remaining length of stay  Weekly Progress Updates: Pt has made some functional gains and met 3 out of 5 short term goals. Pt is currently Mod-Max assist for basic familiar tasks due to severe cognitive impairments impacting her basic problem solving, short term memory, attention, and intellectual awareness. Pt was upgraded to a regular texture diet with thin liquids and is Mod I for use of safe swallow strategies; intermittent supervision is required due to her cognition. She also requires Supervision A-Min A for word finding when communicating. Pt and family education is ongoing. Pt would continue to benefit from skilled  ST while inpatient in order to maximize functional independence and reduce burden of care prior to discharge. Anticipate that pt will need 24/7 supervision at discharge in addition to [place] follow up at next level of care; SNF or hired strict 24/7 supervision at home discharge is recommended due to severity of pt's cognitive impairments which present significant safety risks.       Intensity: Minumum of 1-2 x/day, 30 to 90 minutes Frequency: 3 to 5 out of 7 days Duration/Length of Stay: 09/11/20 Treatment/Interventions: Cognitive remediation/compensation;Cueing hierarchy;Functional tasks;Internal/external aids;Multimodal communication approach;Environmental controls;Dysphagia/aspiration precaution training;Speech/Language facilitation;Patient/family education   Daily Session  Skilled Therapeutic Interventions: Pt was seen for skilled ST targeting cognitive-linguistic goals. SLP facilitated session with overall Min A verbal cues for problem solving and awareness during both verbal and picture based tasks targeting safety awareness in which pt had to identify safety problems and potential solutions. Despite minimal cueing needed during these verbal tasks, when functional conversation shifted to focus on her own home safety related to current deficits, increased Mod A cues required for intellectual awareness and to anticipate how deficits may impact her at home and rationalize need for 24/7 supervision/care. However, she did demonstrate slight improvement in mood and did not become as frustrated with this conversation in comparison to previous sessions. Min A cues for word finding also provided during session, and when ordering dinner and breakfast from nutritional services. Overall Min A verbal cues provided for reorientation to month and year today. Mod A for functional use of memory notebook. Pt left laying in bed with alarm set  and needs within reach. Continue per current plan of care.             Pain Pain Assessment Pain Scale: 0-10 Pain Score: 0-No pain  Therapy/Group: Individual Therapy  Arbutus Leas 09/04/2020, 3:12 PM

## 2020-09-03 NOTE — Progress Notes (Signed)
Physical Therapy Weekly Progress Note  Patient Details  Name: Tara [name] MRN: [id number] Date of Birth: 1936-06-20  Beginning of progress report period: August 28, 2020 End of progress report period: September 03, 2020   Patient has met 4 of 4 short term goals.  Patient is making steady progress toward her goals. She remains with a significant R visual field cut, which impairs her safety with functional mobility. She has poor carryover for functional compensatory strategies for this visual field cut due to cognitive impairments. She is grossly CGA/MinA for functional mobility. LTGs downgraded to CGA for ambulation, stairs and transfers to accommodate for the above mentions deficits.   Patient continues to demonstrate the following deficits muscle weakness, decreased cardiorespiratoy endurance, motor apraxia, decreased coordination and decreased motor planning, field cut, decreased attention to right and decreased motor planning, decreased awareness, decreased problem solving, decreased safety awareness and decreased memory, central origin and decreased standing balance, decreased postural control and decreased balance strategies and therefore will continue to benefit from skilled PT intervention to increase functional independence with mobility.  Patient progressing toward long term goals..  Continue plan of care.  PT Short Term Goals Week 1:  PT Short Term Goal 1 (Week 1): Patient will complete bed <>wc transfer with LRAD and CGA consistently PT Short Term Goal 1 - Progress (Week 1): Met PT Short Term Goal 2 (Week 1): Patient will ambulate >33f with MinA and LRAD PT Short Term Goal 2 - Progress (Week 1): Met PT Short Term Goal 3 (Week 1): Patient will complete 1 step with LRAD and no more than ModA PT Short Term Goal 3 - Progress (Week 1): Met Week 2:  PT Short Term Goal 1 (Week 2): STG=LTG based on ELOS   Therapy Documentation Precautions:  Precautions Precautions: Fall Precaution  Comments: history of vertigo Restrictions Weight Bearing Restrictions: No    JDebbora Dus10/14/2021, 4:14 PM

## 2020-09-04 ENCOUNTER — Inpatient Hospital Stay (HOSPITAL_COMMUNITY): Payer: Medicare Other | Admitting: *Deleted

## 2020-09-04 ENCOUNTER — Inpatient Hospital Stay (HOSPITAL_COMMUNITY): Payer: Medicare Other | Admitting: Speech Pathology

## 2020-09-04 ENCOUNTER — Inpatient Hospital Stay (HOSPITAL_COMMUNITY): Payer: Medicare Other | Admitting: Occupational Therapy

## 2020-09-04 LAB — URINE CULTURE: Culture: >=100000 — AB

## 2020-09-04 MED ORDER — LEVOFLOXACIN 250 MG PO TABS
250.0000 mg | ORAL_TABLET | Freq: Every day | ORAL | Status: AC
  Administered 2020-09-04 – 2020-09-09 (×6): 250 mg via ORAL
  Filled 2020-09-04 (×6): qty 1

## 2020-09-04 NOTE — Progress Notes (Signed)
Pt c/o of increased burning with urination, lower abdomen tender w/palpation. Pt requesting to void every hour with small volume. Bladder Scan showing 200-300 for each PVR. Provider will be notified.

## 2020-09-04 NOTE — Progress Notes (Signed)
Occupational Therapy Session Note  Patient Details  Name: Tara [name] MRN: [id number] Date of Birth: 1936-03-09  Today's Date: 09/04/2020 OT Individual Time: 2774-1287 OT Individual Time Calculation (min): 59 min    Short Term Goals: Week 2:  OT Short Term Goal 1 (Week 2): Continue working on established LTGs until discharge.  Skilled Therapeutic Interventions/Progress Updates:    Pt completed showering, toileting, and dressing during session.  When ambulating to the bathroom she passed the doorway opening to the bathroom on her left side without awareness that she had walked past it.  She corrected this and ambulated over to the tub bench.  She demonstrated decreased understanding to back up to the tub bench to sit after turning around.  When told to back up she instead walked forward requiring mod physical assist to back up to the seat.  Next after sitting, she reported needing to use the bathroom and then transferred over to the 3:1 with min assist.  She completed toileting X 2 during session with min assist and min instructional cueing to sequence hygiene.  She finally, transferred back to the tub bench for shower.  Min assist overall with increased cueing to locate soap and shampoo on the shelf to her slight right.  She was able to transfer out to the wheelchair after shower for dressing with min assist as well.  She completed UB dressing with setup of shirt and sports bra in correct orientation, as she could not orient it when given to her.  Max assist was needed for donning brief and pants as she could not place either LE in the opening of the waistband secondary to flexibility as well as visual perceptual deficits.  Therapist provided total assist for TEDs and gripper socks as well.  Finished session with pt completing brushing of her hair with setup of the brush.  She was left sitting up in the wheelchair with the call button and phone in reach with safety belt in place.    Therapy  Documentation Precautions:  Precautions Precautions: Fall Precaution Comments: history of vertigo Restrictions Weight Bearing Restrictions: No   Pain: Pain Assessment Pain Scale: Faces Pain Score: 0-No pain ADL: See Care Tool Section for some details of mobility and selfcare  Therapy/Group: Individual Therapy  Thatcher Doberstein OTR/L 09/04/2020, 12:39 PM

## 2020-09-04 NOTE — Progress Notes (Signed)
[place] PHYSICAL MEDICINE & REHABILITATION PROGRESS NOTE   Subjective/Complaints:  Increased burning with urination no sweats or chills   Pt denies SOB, abd pain, CP, N/V/C/D,    Objective:   No results found. No results for input(s): WBC, HGB, HCT, PLT in the last 72 hours. No results for input(s): NA, K, CL, CO2, GLUCOSE, BUN, CREATININE, CALCIUM in the last 72 hours.  Intake/Output Summary (Last 24 hours) at 09/04/2020 0802 Last data filed at 09/04/2020 0622 Gross per 24 hour  Intake 640 ml  Output 750 ml  Net -110 ml        Physical Exam: Vital Signs Blood pressure 139/83, pulse 64, temperature 98 F (36.7 C), resp. rate 15, height 5\' 5"  (1.651 m), weight 70.1 kg, SpO2 97 %.  General: No acute distress Mood and affect are appropriate Heart: Regular rate and rhythm no rubs murmurs or extra sounds Lungs: Clear to auscultation, breathing unlabored, no rales or wheezes Abdomen: Positive bowel sounds, soft nontender to palpation, nondistended Extremities: No clubbing, cyanosis, or edema Skin: No evidence of breakdown, no evidence of rash    Neurologic: Cranial nerves II through XII intact, motor strength is 4/5 in bilateral deltoid, bicep, tricep, grip, hip flexor, knee extensors, ankle dorsiflexor and plantar flexor No evidence of field cut on confrontation testing , has some extinction with double simultaneous visual stim , no evidence of vertigo  Musculoskeletal: Full range of motion in all 4 extremities. No joint swelling    Assessment/Plan: 1. Functional deficits secondary to Left P-O ICH  which require 3+ hours per day of interdisciplinary therapy in a comprehensive inpatient rehab setting.  Physiatrist is providing close team supervision and 24 hour management of active medical problems listed below.  Physiatrist and rehab team continue to assess barriers to discharge/monitor patient progress toward functional and medical goals  Care Tool:  Bathing     Body parts bathed by patient: Right arm, Left arm, Chest, Abdomen, Front perineal area, Buttocks, Right upper leg, Left upper leg, Face   Body parts bathed by helper: Left lower leg, Right lower leg Body parts n/a: Right lower leg, Left lower leg   Bathing assist Assist Level: Minimal Assistance - Patient > 75%     Upper Body Dressing/Undressing Upper body dressing   What is the patient wearing?: Pull over shirt    Upper body assist Assist Level: Supervision/Verbal cueing    Lower Body Dressing/Undressing Lower body dressing      What is the patient wearing?: Incontinence brief, Pants     Lower body assist Assist for lower body dressing: Maximal Assistance - Patient 25 - 49%     Toileting Toileting    Toileting assist Assist for toileting: Minimal Assistance - Patient > 75%     Transfers Chair/bed transfer  Transfers assist     Chair/bed transfer assist level: Minimal Assistance - Patient > 75%     Locomotion Ambulation   Ambulation assist      Assist level: Minimal Assistance - Patient > 75% Assistive device: Walker-rolling Max distance: 150   Walk 10 feet activity   Assist     Assist level: Contact Guard/Touching assist Assistive device: Walker-rolling   Walk 50 feet activity   Assist Walk 50 feet with 2 turns activity did not occur: Safety/medical concerns (patient fatigue)  Assist level: Minimal Assistance - Patient > 75% Assistive device: Walker-rolling    Walk 150 feet activity   Assist Walk 150 feet activity did not occur: Safety/medical concerns (  patient fatigue)  Assist level: Minimal Assistance - Patient > 75% Assistive device: Walker-rolling    Walk 10 feet on uneven surface  activity   Assist Walk 10 feet on uneven surfaces activity did not occur: Safety/medical concerns (patient fatigue)         Wheelchair     Assist Will patient use wheelchair at discharge?: No Type of Wheelchair: Manual    Wheelchair  assist level: Minimal Assistance - Patient > 75% Max wheelchair distance: 150    Wheelchair 50 feet with 2 turns activity    Assist        Assist Level: Minimal Assistance - Patient > 75%   Wheelchair 150 feet activity     Assist      Assist Level: Minimal Assistance - Patient > 75%   Blood pressure 139/83, pulse 64, temperature 98 F (36.7 C), resp. rate 15, height 5\' 5"  (1.651 m), weight 70.1 kg, SpO2 97 %.     Medical Problem List and Plan: 1.  Decreased functional mobility with aphasia secondary to left parieto-occipital acute ICH and B/L cerebral infarcts- prior leg braces worn by pt- ?AFO's             -patient may  shower             -ELOS/Goals: 10-14 days- mod I to supervision   -Continue CIR 2.  Antithrombotics: -DVT/anticoagulation: SCDs-  Due to [place]             -antiplatelet therapy: N/A 3. Pain Management: Tylenol as needed  10/10- tylenol prn. Well controlled 4. Mood: Valium 5 mg twice daily as needed anxiety  10/10- switched with Ativan which is better for vertigo-              -antipsychotic agents: N/A 5. Neuropsych: This patient is capable of making decisions on her own behalf. 6. Skin/Wound Care: Routine skin checks 7. Fluids/Electrolytes/Nutrition: Routine in and outs with follow-up chemistries 8.  Hypertension.  Cozaar 50 mg daily, Norvasc 10 mg daily.  Monitor with increased mobility Vitals:   09/03/20 1927 09/04/20 0505  BP: 135/63 139/83  Pulse: 66 64  Resp: 16 15  Temp: 98.1 F (36.7 C) 98 F (36.7 C)  SpO2: 96% 97%  10/15 improved on cozaar 100mg   9.  Hypothyroidism.  TSH 1.722.  Continue Synthroid 10. Vertigo- has tried acupuncture, vestibular therapy , [facility], meclizine not helpful, has not tried scop patch, discussed with pt and daughter who are willing to try , although given chronicity and lack of response to other anticholinergic agents expectations are low     11.  UTI on  Macrobid Ucx  Showing pseudomonas, will start levaquin ( has cephalosporin allergy)   LOS: 8 days A FACE TO FACE EVALUATION WAS PERFORMED  Charlett Blake 09/04/2020, 8:02 AM

## 2020-09-04 NOTE — Progress Notes (Signed)
Physical Therapy Session Note  Patient Details  Name: Tara [name] MRN: [id number] Date of Birth: 08-06-1936  Today's Date: 09/04/2020 PT Individual Time: 7867-6720 PT Individual Time Calculation (min): 58 min   Short Term Goals: Week 2:  PT Short Term Goal 1 (Week 2): STG=LTG based on ELOS  Skilled Therapeutic Interventions/Progress Updates:    Patient received supine in bed agreeable to PT. She denies pain. She required MinA + Max verbal cuing to don pants sitting edge of bed. She was frequently trying to put R LE into pant leg, without getting her foot into the hole first, and trying to pull the pants all the way up without her leg in the pants. Patient becomes easily frustrated with verbal cuing to correct for these discrepancies. She transferred to wc via stand pivot with CGA and verbal cuing. PT propelling patient to dayroom for time management. Upon standing to complete card task, patient reporting that she had to use restroom. ModA needed to transfer to toilet in dayroom using grab bars. With toilet on her R, patient would turn to face toilet, instead of turning to sit on toilet. PT eventually having to manually facilitate turn to sit on toilet safely. Patient found to have had episode of bladder incontinence. ModA for pericare seated on toilet and for clothing management. Patient completing card task playing "War." She was able to identify which card was the higher number/suit and who won which round. Patient progressing to ordering cards from highest to lowest when given 5 cards to work with. She required up to MaxA to complete this task. Task completed in open environment and may have contributed to patients difficulty with concentrating on task. Patient returning to room in wc, seatbelt alarm on, call light within reach.   Therapy Documentation Precautions:  Precautions Precautions: Fall Precaution Comments: history of vertigo Restrictions Weight Bearing Restrictions:  No   Therapy/Group: Individual Therapy  Karoline Caldwell, PT, DPT, CBIS 09/04/2020, 7:32 AM

## 2020-09-05 NOTE — Progress Notes (Signed)
[place] PHYSICAL MEDICINE & REHABILITATION PROGRESS NOTE   Subjective/Complaints:  Happy to have no therapy this weekend, no urinary symptoms  Pt denies SOB, abd pain, CP, N/V/C/D,    Objective:   No results found. No results for input(s): WBC, HGB, HCT, PLT in the last 72 hours. No results for input(s): NA, K, CL, CO2, GLUCOSE, BUN, CREATININE, CALCIUM in the last 72 hours.  Intake/Output Summary (Last 24 hours) at 09/05/2020 1109 Last data filed at 09/05/2020 0546 Gross per 24 hour  Intake 720 ml  Output 200 ml  Net 520 ml        Physical Exam: Vital Signs Blood pressure (!) 127/58, pulse 60, temperature 97.7 F (36.5 C), resp. rate 16, height 5\' 5"  (1.651 m), weight 71.5 kg, SpO2 98 %.   General: No acute distress Mood and affect are appropriate Heart: Regular rate and rhythm no rubs murmurs or extra sounds Lungs: Clear to auscultation, breathing unlabored, no rales or wheezes Abdomen: Positive bowel sounds, soft nontender to palpation, nondistended Extremities: No clubbing, cyanosis, or edema Skin: No evidence of breakdown, no evidence of rash Neurologic: Cranial nerves II through XII intact, motor strength is 5/5 in bilateral deltoid, bicep, tricep, grip, hip flexor, knee extensors, ankle dorsiflexor and plantar flexor Sensory exam normal sensation to light touch and proprioception in bilateral upper and lower extremities Cerebellar exam normal finger to nose to finger as well as heel to shin in bilateral upper and lower extremities Musculoskeletal: Full range of motion in all 4 extremities. No joint swelling  Neurologic: Cranial nerves II through XII intact, motor strength is 4/5 in bilateral deltoid, bicep, tricep, grip, hip flexor, knee extensors, ankle dorsiflexor and plantar flexor No evidence of field cut on confrontation testing , has some extinction with double simultaneous visual stim , no evidence of vertigo  Musculoskeletal: Full range of motion in  all 4 extremities. No joint swelling    Assessment/Plan: 1. Functional deficits secondary to Left P-O ICH  which require 3+ hours per day of interdisciplinary therapy in a comprehensive inpatient rehab setting.  Physiatrist is providing close team supervision and 24 hour management of active medical problems listed below.  Physiatrist and rehab team continue to assess barriers to discharge/monitor patient progress toward functional and medical goals  Care Tool:  Bathing    Body parts bathed by patient: Right arm, Left arm, Chest, Abdomen, Front perineal area, Buttocks, Right upper leg, Left upper leg, Face   Body parts bathed by helper: Left lower leg, Right lower leg Body parts n/a: Right lower leg, Left lower leg   Bathing assist Assist Level: Minimal Assistance - Patient > 75%     Upper Body Dressing/Undressing Upper body dressing   What is the patient wearing?: Pull over shirt    Upper body assist Assist Level: Supervision/Verbal cueing    Lower Body Dressing/Undressing Lower body dressing      What is the patient wearing?: Incontinence brief, Pants     Lower body assist Assist for lower body dressing: Maximal Assistance - Patient 25 - 49%     Toileting Toileting    Toileting assist Assist for toileting: Minimal Assistance - Patient > 75%     Transfers Chair/bed transfer  Transfers assist     Chair/bed transfer assist level: Minimal Assistance - Patient > 75%     Locomotion Ambulation   Ambulation assist      Assist level: Minimal Assistance - Patient > 75% Assistive device: Walker-rolling Max distance: 150  Walk 10 feet activity   Assist     Assist level: Contact Guard/Touching assist Assistive device: Walker-rolling   Walk 50 feet activity   Assist Walk 50 feet with 2 turns activity did not occur: Safety/medical concerns (patient fatigue)  Assist level: Minimal Assistance - Patient > 75% Assistive device: Walker-rolling     Walk 150 feet activity   Assist Walk 150 feet activity did not occur: Safety/medical concerns (patient fatigue)  Assist level: Minimal Assistance - Patient > 75% Assistive device: Walker-rolling    Walk 10 feet on uneven surface  activity   Assist Walk 10 feet on uneven surfaces activity did not occur: Safety/medical concerns (patient fatigue)         Wheelchair     Assist Will patient use wheelchair at discharge?: No Type of Wheelchair: Manual    Wheelchair assist level: Minimal Assistance - Patient > 75% Max wheelchair distance: 150    Wheelchair 50 feet with 2 turns activity    Assist        Assist Level: Minimal Assistance - Patient > 75%   Wheelchair 150 feet activity     Assist      Assist Level: Minimal Assistance - Patient > 75%   Blood pressure (!) 127/58, pulse 60, temperature 97.7 F (36.5 C), resp. rate 16, height 5\' 5"  (1.651 m), weight 71.5 kg, SpO2 98 %.     Medical Problem List and Plan: 1.  Decreased functional mobility with aphasia secondary to left parieto-occipital acute ICH and B/L cerebral infarcts- prior leg braces worn by pt- ?AFO's             -patient may  shower             -ELOS/Goals: 10-14 days- mod I to supervision   -Continue CIR PT, OT 2.  Antithrombotics: -DVT/anticoagulation: SCDs-  Due to [place]             -antiplatelet therapy: N/A 3. Pain Management: Tylenol as needed  10/10- tylenol prn. Well controlled 4. Mood: Valium 5 mg twice daily as needed anxiety  10/10- switched with Ativan which is better for vertigo-              -antipsychotic agents: N/A 5. Neuropsych: This patient is capable of making decisions on her own behalf. 6. Skin/Wound Care: Routine skin checks 7. Fluids/Electrolytes/Nutrition: Routine in and outs with follow-up chemistries 8.  Hypertension.  Cozaar 50 mg daily, Norvasc 10 mg daily.  Monitor with increased mobility Vitals:   09/04/20 1949 09/05/20 0431  BP: 127/60 (!) 127/58   Pulse: 61 60  Resp: 18 16  Temp: 98 F (36.7 C) 97.7 F (36.5 C)  SpO2: 97% 98%  10/15 improved on cozaar 100mg   9.  Hypothyroidism.  TSH 1.722.  Continue Synthroid 10. Vertigo- has tried acupuncture, vestibular therapy , [facility], meclizine not helpful, has not tried scop patch, discussed with pt and daughter who are willing to try , although given chronicity and lack of response to other anticholinergic agents expectations are low     11.  UTI  pseudomonas, sens to levaquin ( has cephalosporin allergy) , cont  250mg  qd  LOS: 9 days A FACE TO FACE EVALUATION WAS PERFORMED  Charlett Blake 09/05/2020, 11:09 AM

## 2020-09-06 LAB — GLUCOSE, CAPILLARY: Glucose-Capillary: 93 mg/dL (ref 70–99)

## 2020-09-07 ENCOUNTER — Inpatient Hospital Stay (HOSPITAL_COMMUNITY): Payer: Medicare Other | Admitting: Occupational Therapy

## 2020-09-07 ENCOUNTER — Inpatient Hospital Stay (HOSPITAL_COMMUNITY): Payer: Medicare Other | Admitting: Speech Pathology

## 2020-09-07 ENCOUNTER — Inpatient Hospital Stay (HOSPITAL_COMMUNITY): Payer: Medicare Other

## 2020-09-07 DIAGNOSIS — I10 Essential (primary) hypertension: Secondary | ICD-10-CM

## 2020-09-07 NOTE — Progress Notes (Signed)
[facility] PHYSICAL MEDICINE & REHABILITATION PROGRESS NOTE   Subjective/Complaints:  Anxious to get moving again today. No new issues this morning. Not happy about being in the hospital but knows she is in a good place for recovery   ROS: Patient denies fever, rash, sore throat, blurred vision, nausea, vomiting, diarrhea, cough, shortness of breath or chest pain, joint or back pain, headache, or mood change.    Objective:   No results found. No results for input(s): WBC, HGB, HCT, PLT in the last 72 hours. No results for input(s): NA, K, CL, CO2, GLUCOSE, BUN, CREATININE, CALCIUM in the last 72 hours.  Intake/Output Summary (Last 24 hours) at 09/07/2020 1345 Last data filed at 09/07/2020 1100 Gross per 24 hour  Intake 360 ml  Output --  Net 360 ml        Physical Exam: Vital Signs Blood pressure (!) 150/69, pulse (!) 58, temperature (!) 97.5 F (36.4 C), temperature source Oral, resp. rate 18, height 5\' 5"  (1.651 m), weight 68.3 kg, SpO2 98 %.   Constitutional: No distress . Vital signs reviewed. HEENT: EOMI, oral membranes moist Neck: supple Cardiovascular: RRR without murmur. No JVD    Respiratory/Chest: CTA Bilaterally without wheezes or rales. Normal effort    GI/Abdomen: BS +, non-tender, non-distended Ext: no clubbing, cyanosis, 1+ RLE edema Psych: pleasant and cooperative Skin: warm, a few scattered bruises.  Neurologic: Cranial nerves II through XII intact, with intact visual fields motor strength is 4 to 4+5 in bilateral deltoid, bicep, tricep, grip, hip flexor, knee extensors, ankle dorsiflexor and plantar flexor normal sensory and coordination Musculoskeletal: Full range of motion in all 4 extremities. No joint swelling         Assessment/Plan: 1. Functional deficits secondary to Left P-O ICH  which require 3+ hours per day of interdisciplinary therapy in a comprehensive inpatient rehab setting.  Physiatrist is providing close team supervision and 24  hour management of active medical problems listed below.  Physiatrist and rehab team continue to assess barriers to discharge/monitor patient progress toward functional and medical goals  Care Tool:  Bathing    Body parts bathed by patient: Right arm, Left arm, Chest, Abdomen, Front perineal area, Buttocks, Right upper leg, Left upper leg, Face   Body parts bathed by helper: Left lower leg, Right lower leg Body parts n/a: Right lower leg, Left lower leg   Bathing assist Assist Level: Minimal Assistance - Patient > 75%     Upper Body Dressing/Undressing Upper body dressing   What is the patient wearing?: Pull over shirt    Upper body assist Assist Level: Supervision/Verbal cueing    Lower Body Dressing/Undressing Lower body dressing      What is the patient wearing?: Pants, Incontinence brief     Lower body assist Assist for lower body dressing: Moderate Assistance - Patient 50 - 74%     Toileting Toileting    Toileting assist Assist for toileting: Minimal Assistance - Patient > 75%     Transfers Chair/bed transfer  Transfers assist     Chair/bed transfer assist level: Minimal Assistance - Patient > 75%     Locomotion Ambulation   Ambulation assist      Assist level: Contact Guard/Touching assist Assistive device: Walker-rolling Max distance: 100   Walk 10 feet activity   Assist     Assist level: Contact Guard/Touching assist Assistive device: Walker-rolling   Walk 50 feet activity   Assist Walk 50 feet with 2 turns activity did not  occur: Safety/medical concerns (patient fatigue)  Assist level: Contact Guard/Touching assist Assistive device: Walker-rolling    Walk 150 feet activity   Assist Walk 150 feet activity did not occur: Safety/medical concerns (patient fatigue)  Assist level: Minimal Assistance - Patient > 75% Assistive device: Walker-rolling    Walk 10 feet on uneven surface  activity   Assist Walk 10 feet on uneven  surfaces activity did not occur: Safety/medical concerns (patient fatigue)         Wheelchair     Assist Will patient use wheelchair at discharge?: No Type of Wheelchair: Manual    Wheelchair assist level: Minimal Assistance - Patient > 75% Max wheelchair distance: 150    Wheelchair 50 feet with 2 turns activity    Assist        Assist Level: Minimal Assistance - Patient > 75%   Wheelchair 150 feet activity     Assist      Assist Level: Minimal Assistance - Patient > 75%   Blood pressure (!) 150/69, pulse (!) 58, temperature (!) 97.5 F (36.4 C), temperature source Oral, resp. rate 18, height 5\' 5"  (1.651 m), weight 68.3 kg, SpO2 98 %.     Medical Problem List and Plan: 1.  Decreased functional mobility with aphasia secondary to left parieto-occipital acute ICH and B/L cerebral infarcts- prior leg braces worn by pt- ?AFO's             -patient may  shower             -ELOS/Goals: 10-14 days- mod I to supervision   -Continue CIR PT, OT, SLP 2.  Antithrombotics: -DVT/anticoagulation: SCDs-  Due to [place]             -antiplatelet therapy: N/A 3. Pain Management: Tylenol as needed  10/10- tylenol prn. Well controlled 4. Mood: Valium 5 mg twice daily as needed anxiety  10/10- switched with Ativan which is better for vertigo-              -antipsychotic agents: N/A 5. Neuropsych: This patient is capable of making decisions on her own behalf. 6. Skin/Wound Care: Routine skin checks 7. Fluids/Electrolytes/Nutrition: Routine in and outs with follow-up chemistries 8.  Hypertension.  Cozaar 50 mg daily, Norvasc 10 mg daily.  Monitor with increased mobility Vitals:   09/06/20 1916 09/07/20 0429  BP: 101/89 (!) 150/69  Pulse: 74 (!) 58  Resp: 18 18  Temp: 97.6 F (36.4 C) (!) 97.5 F (36.4 C)  SpO2: 98% 98%  10/18 improved on cozaar 100mg   9.  Hypothyroidism.  TSH 1.722.  Continue Synthroid 10. Vertigo- has tried acupuncture, vestibular therapy , [facility], meclizine not helpful, has not tried scop patch, discussed with pt and daughter who are willing to try , although given chronicity and lack of response to other anticholinergic agents expectations are low     11.  UTI  pseudomonas, sens to levaquin ( has cephalosporin allergy) , cont  250mg  qd  -sx resolving  LOS: 11 days A FACE TO FACE EVALUATION WAS PERFORMED  Meredith Staggers 09/07/2020, 1:45 PM

## 2020-09-07 NOTE — Plan of Care (Signed)
  Problem: Consults Goal: RH GENERAL PATIENT EDUCATION Description: See Patient Education module for education specifics. Outcome: Progressing Goal: Skin Care Protocol Initiated - if Braden Score 18 or less Description: If consults are not indicated, leave blank or document N/A Outcome: Progressing Goal: Nutrition Consult-if indicated Outcome: Progressing Goal: Diabetes Guidelines if Diabetic/Glucose > 140 Description: If diabetic or lab glucose is > 140 mg/dl - Initiate Diabetes/Hyperglycemia Guidelines & Document Interventions  Outcome: Progressing   Problem: RH BOWEL ELIMINATION Goal: RH STG MANAGE BOWEL WITH ASSISTANCE Description: STG Manage Bowel with mod Assistance. Outcome: Progressing Goal: RH STG MANAGE BOWEL W/MEDICATION W/ASSISTANCE Description: STG Manage Bowel with Medication with mod I Assistance. Outcome: Progressing   Problem: RH BLADDER ELIMINATION Goal: RH STG MANAGE BLADDER WITH ASSISTANCE Description: STG Manage Bladder With  mod Assistance Outcome: Progressing   Problem: RH SKIN INTEGRITY Goal: RH STG SKIN FREE OF INFECTION/BREAKDOWN Description: With min assist Outcome: Progressing Goal: RH STG MAINTAIN SKIN INTEGRITY WITH ASSISTANCE Description: STG Maintain Skin Integrity With Assistance. Outcome: Progressing   Problem: RH SAFETY Goal: RH STG ADHERE TO SAFETY PRECAUTIONS W/ASSISTANCE/DEVICE Description: STG Adhere to Safety Precautions With mod Assistance/Device. Outcome: Progressing Goal: RH STG DECREASED RISK OF FALL WITH ASSISTANCE Description: STG Decreased Risk of Fall With cues/reminders Assistance. Outcome: Progressing   Problem: RH PAIN MANAGEMENT Goal: RH STG PAIN MANAGED AT OR BELOW PT'S PAIN GOAL Description: At or below level 4 Outcome: Progressing   Problem: RH KNOWLEDGE DEFICIT GENERAL Goal: RH STG INCREASE KNOWLEDGE OF SELF CARE AFTER HOSPITALIZATION Description: Patient will be able to direct care at discharge using handouts  and educational materials with cues/reminders/supervision Outcome: Progressing   Problem: RH Pre-functional/Other (Specify) Goal: RH LTG Pre-functional (Specify) Outcome: Progressing Goal: RH LTG Interdisciplinary (Specify) 1 Description: RH LTG Interdisciplinary (Specify)1 Outcome: Progressing Goal: RH LTG Interdisciplinary (Specify) 2 Description: RH LTG Interdisciplinary (Specify) 2  Outcome: Progressing   Problem: Consults Goal: RH STROKE PATIENT EDUCATION Description: See Patient Education module for education specifics  Outcome: Progressing Goal: Nutrition Consult-if indicated Outcome: Progressing   Problem: RH Vision Goal: RH LTG Vision (Specify) Outcome: Progressing   Problem: Consults Goal: RH STROKE PATIENT EDUCATION Description: See Patient Education module for education specifics  Outcome: Progressing

## 2020-09-07 NOTE — Progress Notes (Signed)
Speech Language Pathology Daily Session Note  Patient Details  Name: YURIANNA TUSING MRN: [id number] Date of Birth: 09-Jun-1936  Today's Date: 09/07/2020 SLP Individual Time: 1300-1327 SLP Individual Time Calculation (min): 27 min  Short Term Goals: Week 2: SLP Short Term Goal 1 (Week 2): STG=LTG due to remaining length of stay  Skilled Therapeutic Interventions: Pt was seen for skilled ST targeting cognitive goals. SLP facilitated session with a basic 3-step action card sequencing task, during which pt required overall Min A verbal and visual cues for problem solving and error awareness. She continues to required increased Mod-Max A for intellectual awareness of deficits. Rationale for selected therapy activity provided when pt became slightly frustrated. She recalled that she was set to discharge at end of week this week. Memory notebook updated prior to end of session. Pt left sitting in chair with alarm set and needs within reach. Continue per current plan of care.          Pain Pain Assessment Pain Scale: 0-10 Pain Score: 0-No pain  Therapy/Group: Individual Therapy  Arbutus Leas 09/07/2020, 3:01 PM

## 2020-09-07 NOTE — Progress Notes (Signed)
Physical Therapy Session Note  Patient Details  Name: Tara [name] MRN: [id number] Date of Birth: May 13, 1936  Today's Date: 09/07/2020 PT Individual Time: 1100-1158 PT Individual Time Calculation (min): 58 min   Short Term Goals: Week 2:  PT Short Term Goal 1 (Week 2): STG=LTG based on ELOS  Skilled Therapeutic Interventions/Progress Updates:    Patient received sitting up in wc, agreeable to PT. She denies pain. PT propelling patient in wc to therapy gym for time management and energy conservation. She was able to complete [employer] seated with 2.73s reaction time. No verbal cuing needed to attend to R side. Patient completing Dynavision again standing with RW, 3.08s reaction time. No verbal cuing needed to attend to R side. Patient standing with RW and CGA to build structure based off of picture. Initially, patient was given entire bin of pieces to select the correct next piece to build structure. Patient was requiring up to [facility] for this. PT then picked out all of the pieces needed to build structure for patient to pick from. She then required up to [employer] to complete this task. Greatest difficulty seemed to be looking at shape of the piece on paper and finding matching piece. Poor visuospatial skills noted. Minor posterior sway and intermittent LOB noted. No ankle strategy employed. Patient standing to order 5 cards by number. Min verbal cuing needed for accuracy in open environment. Patient then asked to sort cards based on color while standing, same open environment. Patient with significant difficulty completing this task. She requested to stop citing frustration and requesting to return to her room "where it's quieter." PT discussed with patient implications from her experienced frustration stating that when patient is trying to complete cognitive task, it's best to minimize/eliminate distractions to ensure accurate completion. Patient verbalized understanding. She was able to ambulate  128ft in hallway with CGA and verbal cues to avoid obstacles, mainly on her R side. Patient nearly missing doorframe and room sink on the R side and made no attempt to ensure that she avoided hitting them. Mod verbal cues needed to turn to sit down in wc. Seatbelt alarm on, call light within reach.   Therapy Documentation Precautions:  Precautions Precautions: Fall Precaution Comments: history of vertigo Restrictions Weight Bearing Restrictions: No    Therapy/Group: Individual Therapy  Karoline Caldwell, PT, DPT, CBIS 09/07/2020, 7:46 AM

## 2020-09-07 NOTE — Progress Notes (Signed)
Patient ID: Tara [name], female   DOB: 06-Sep-1936, 84 y.o.   MRN: [id number]   Patient assessment will be completed in person on Wednesday, October 20th at 10:00 AM with Velva Harman, [name]

## 2020-09-07 NOTE — Progress Notes (Signed)
Occupational Therapy Session Note  Patient Details  Name: Tara [name] MRN: [id number] Date of Birth: Dec 23, 1935  Today's Date: 09/07/2020 OT Individual Time: 3244-0102 OT Individual Time Calculation (min): 56 min    Short Term Goals: Week 2:  OT Short Term Goal 1 (Week 2): Continue working on established LTGs until discharge.  Skilled Therapeutic Interventions/Progress Updates:    Session 1: [phone])  Pt in the bathroom to start session with nursing present.  She was able to transfer off of the toilet with min assist and ambulate out to the sink for grooming and dressing tasks. She washed her face and under her arms, but did not want to wash further today. Increased difficulty with motor planning when attempting to donn her pullover shirt.  She needed mod instructional cueing for orientation of her pullover shirt and then she was able to finally don it without any physical assist.  Reacher was utilized to try and help with donning brief and pants.  She still needed mod assist for threading items over her feet secondary to visual difficulty and motor planning.  Therapist donned socks over sockaide to see if she could use this after TEDs were donned.  She needed max assist to integrate placing the sockaide over her foot.  Finished session with completion of grooming tasks including application of lotion, oral hygiene, and brushing her hair.  She was able to complete with setup assist and increased time.  Still with mod instructional cueing throughout session to locate items right of midline.    Session 2: (1404-1500)  Pt worked on Audiological scientist, flower, and house to start session.  She was able to look at the clock drawing from therapist with only the number 12 in it and state that "it looks like a clock" when asked.  When told to draw a clock she demonstrated extreme difficulty and was only able to place two numbers on the clock, with only one being in the correct location "12"  She attempted  X 2 without any positive results.  This was the same with the house and flower designs.  She exhibited decreased ability to draw and add detail into her pictures in the appropriate locations.  Next, had her work on scanning playing cards on her table and finding the appropriate ones to equal a number therapist gave her.  She was able to scan but needed increased time to find cards that equalled the number given and on 50% of attempts, she was unable to recall the correct number therapist gave her.  Finished session with pt sitting in the wheelchair with the call button and phone in reach and safety belt in place.  Pt's daughter in at the end of the session and reports that pt will go to ALF at discharge and they can provide 24 hour assist there.    Therapy Documentation Precautions:  Precautions Precautions: Fall Precaution Comments: history of vertigo Restrictions Weight Bearing Restrictions: No  Pain: Pain Assessment Pain Scale: 0-10 Pain Score: 0-No pain ADL: See Care Tool Section for some details of mobility and selfcare  Therapy/Group: Individual Therapy  Cruise Baumgardner OTR/L 09/07/2020, 12:28 PM

## 2020-09-07 NOTE — Progress Notes (Signed)
Patient ID: Tara [name], female   DOB: [name] 21, 1936, 84 y.o.   MRN: [id number]   Sw called to follow up with Rod Can to discuss assessment. Left voicemail. Will await follow up.  [place], [place]

## 2020-09-08 ENCOUNTER — Inpatient Hospital Stay (HOSPITAL_COMMUNITY): Payer: Medicare Other | Admitting: Occupational Therapy

## 2020-09-08 ENCOUNTER — Inpatient Hospital Stay (HOSPITAL_COMMUNITY): Payer: Medicare Other | Admitting: Speech Pathology

## 2020-09-08 ENCOUNTER — Inpatient Hospital Stay (HOSPITAL_COMMUNITY): Payer: Medicare Other | Admitting: Physical Therapy

## 2020-09-08 NOTE — Progress Notes (Signed)
Occupational Therapy Session Note  Patient Details  Name: Tara [name] MRN: [id number] Date of Birth: 1936-05-30  Today's Date: 09/08/2020 OT Individual Time: 0850-1002 OT Individual Time Calculation (min): 72 min    Short Term Goals: Week 2:  OT Short Term Goal 1 (Week 2): Continue working on established LTGs until discharge.  Skilled Therapeutic Interventions/Progress Updates:    Pt worked on toileting, showering, and dressing during session.  Supervision for supine to sit with min assist using the RW for support.  She was able to negotiate around obstacles on the right without assist, but still did bump ito the grab bars next to the toilet.  She needed min assist for toileting tasks with min assist for removal of clothing after toileting for showering.  She completed transfer over to the shower with min assist using the RW and completed all bathing sit to stand with min assist.  Min instructional cueing for scanning to the right to locate shampoo and soap.  At times she would try and place her washcloth under the water on the left, however no water would be there as the therapist was holding the shower head to the side.  She needed increased time to locate it.  Once she dried off, she was able to transfer out to the wheelchair for dressing at min assist level.  She needed assist for orientation of her sports bra as she started putting it on wrong with one arm going through the arm hole and trying to bring it out to the neck.  Once it was re-oriented, she was able to sequence and donn it. She was able to donn the pullover shirt without therapist having to orient it.  Next,she worked on [employer] dressing with use of the reacher, but still needed max assist for donning brief and mod assist for donning pants secondary to visual perceptual deficits.  Finished session with therapist assist to donn TEDs and gripper socks.  She was able to complete grooming task of brushing her hair with supervision.    Therapy Documentation Precautions:  Precautions Precautions: Fall Precaution Comments: history of vertigo Restrictions Weight Bearing Restrictions: No  Pain: Pain Assessment Pain Scale: Faces Pain Score: 0-No pain Faces Pain Scale: No hurt ADL: See Care Tool Section for some details of mobility and selfcare  Therapy/Group: Individual Therapy  Markeshia Giebel OTR/L 09/08/2020, 12:05 PM

## 2020-09-08 NOTE — Plan of Care (Signed)
°  Problem: Consults Goal: RH GENERAL PATIENT EDUCATION Description: See Patient Education module for education specifics. Outcome: Progressing Goal: Skin Care Protocol Initiated - if Braden Score 18 or less Description: If consults are not indicated, leave blank or document N/A Outcome: Progressing Goal: Nutrition Consult-if indicated Outcome: Progressing Goal: Diabetes Guidelines if Diabetic/Glucose > 140 Description: If diabetic or lab glucose is > 140 mg/dl - Initiate Diabetes/Hyperglycemia Guidelines & Document Interventions  Outcome: Progressing   Problem: RH BOWEL ELIMINATION Goal: RH STG MANAGE BOWEL WITH ASSISTANCE Description: STG Manage Bowel with mod Assistance. Outcome: Progressing Goal: RH STG MANAGE BOWEL W/MEDICATION W/ASSISTANCE Description: STG Manage Bowel with Medication with mod I Assistance. Outcome: Progressing   Problem: RH BLADDER ELIMINATION Goal: RH STG MANAGE BLADDER WITH ASSISTANCE Description: STG Manage Bladder With  mod Assistance Outcome: Progressing   Problem: RH SKIN INTEGRITY Goal: RH STG SKIN FREE OF INFECTION/BREAKDOWN Description: With min assist Outcome: Progressing Goal: RH STG MAINTAIN SKIN INTEGRITY WITH ASSISTANCE Description: STG Maintain Skin Integrity With min Assistance. Outcome: Progressing   Problem: RH SAFETY Goal: RH STG ADHERE TO SAFETY PRECAUTIONS W/ASSISTANCE/DEVICE Description: STG Adhere to Safety Precautions With mod Assistance/Device. Outcome: Progressing Goal: RH STG DECREASED RISK OF FALL WITH ASSISTANCE Description: STG Decreased Risk of Fall With cues/reminders Assistance. Outcome: Progressing   Problem: RH PAIN MANAGEMENT Goal: RH STG PAIN MANAGED AT OR BELOW PT'S PAIN GOAL Description: At or below level 4 Outcome: Progressing   Problem: RH KNOWLEDGE DEFICIT GENERAL Goal: RH STG INCREASE KNOWLEDGE OF SELF CARE AFTER HOSPITALIZATION Description: Patient will be able to direct care at discharge using  handouts and educational materials with cues/reminders/supervision Outcome: Progressing   Problem: RH Pre-functional/Other (Specify) Goal: RH LTG Pre-functional (Specify) Outcome: Progressing Goal: RH LTG Interdisciplinary (Specify) 1 Description: RH LTG Interdisciplinary (Specify)1 Outcome: Progressing Goal: RH LTG Interdisciplinary (Specify) 2 Description: RH LTG Interdisciplinary (Specify) 2  Outcome: Progressing

## 2020-09-08 NOTE — Progress Notes (Signed)
Speech Language Pathology Daily Session Note  Patient Details  Name: Tara [name] MRN: [id number] Date of Birth: [birth year]  Today's Date: 09/08/2020 SLP Individual Time: 1000-1042 SLP Individual Time Calculation (min): 42 min  Short Term Goals: Week 2: SLP Short Term Goal 1 (Week 2): STG=LTG due to remaining length of stay  Skilled Therapeutic Interventions: Pt was seen for skilled ST targeting cognitive goals. SLP facilitated session with a basic calendar task in which she had to practice using a calendar functionally, and interpret information from it to answer basic questions. Pt required overall Mod A verbal and visual cues for problem solving and Mod A verbal repetition for recall within task. In functional conversation regarding her performance, she required Min A verbal cues for awareness and acknowledgment of working memory deficit. She also expressed sadness regarding being unable to bake now (as it is unsafe based on current physical and cognitive status). SLP discussed alternatives with pt such as talking about recipes with her grandchildren or helping to verbally instruct them as they do the baking/cooking. Pt left sitting in chair with alarm set and needs within reach. Continue per current plan of care.          Pain Pain Assessment Pain Scale: 0-10 Pain Score: 0-No pain Faces Pain Scale: No hurt  Therapy/Group: Individual Therapy  Arbutus Leas 09/08/2020, 12:20 PM

## 2020-09-08 NOTE — Plan of Care (Signed)
°  Problem: RH Expression Communication Goal: LTG Patient will increase word finding of common (SLP) Description: LTG:  Patient will increase word finding of common objects/daily info/abstract thoughts with cues using compensatory strategies (SLP). Flowsheets (Taken 09/08/2020 1243) LTG: Patient will increase word finding of common (SLP): Supervision Patient will use compensatory strategies to increase word finding of: Abstract thoughts Note: Downgraded due to less than expected progress   Problem: RH Problem Solving Goal: LTG Patient will demonstrate problem solving for (SLP) Description: LTG:  Patient will demonstrate problem solving for basic/complex daily situations with cues  (SLP) Flowsheets (Taken 09/08/2020 1243) LTG Patient will demonstrate problem solving for: Moderate Assistance - Patient 50 - 74% Note: Downgraded due to less than expected progress   Problem: RH Memory Goal: LTG Patient will use memory compensatory aids to (SLP) Description: LTG:  Patient will use memory compensatory aids to recall biographical/new, daily complex information with cues (SLP) Flowsheets (Taken 09/08/2020 1243) LTG: Patient will use memory compensatory aids to (SLP): Moderate Assistance - Patient 50 - 74%   Problem: RH Awareness Goal: LTG: Patient will demonstrate awareness during functional activites type of (SLP) Description: LTG: Patient will demonstrate awareness during functional activites type of (SLP) Flowsheets (Taken 09/08/2020 1243) LTG: Patient will demonstrate awareness during cognitive/linguistic activities with assistance of (SLP): Moderate Assistance - Patient 50 - 74% Note: Downgraded due to less than expected progress

## 2020-09-08 NOTE — Progress Notes (Signed)
Physical Therapy Session Note  Patient Details  Name: Tara [name] MRN: [id number] Date of Birth: 09/14/1936  Today's Date: 09/08/2020 PT Individual Time: 1400-1525 PT Individual Time Calculation (min): 85 min   Short Term Goals: Week 2:  PT Short Term Goal 1 (Week 2): STG=LTG based on ELOS  Skilled Therapeutic Interventions/Progress Updates:    Patient received sitting up in wc agreeable to PT. She denies pain at this time. Patient requesting to use restroom, she was able to ambulate into bathroom with RW and CGA. Continues to require up to max verbal cuing to attend to R side to find toilet to sit on it safely. Patient making multiple attempts to sit right beside toilet. MaxA from PT to intervene to ensure patient made it to toilet safely. CGA to ambulate back to wc with Mod verbal cuing to safely sit into wc. PT propelling patient in wc to mock apt for time management and energy conservation. Patient ambulating with RW in kitchen opening and closing cabinets to ID various items in each cabinet. On first pass, all the cabinets were on patients R side and she told PT that she didn't see any cabinets at all. After turning around (cabinets now on L side), patient able to open and close cabinet doors. Max verbal cuing to keep using walker appropriately. Patient often neglecting to open R side of cabinet or close it again unless PT verbally instructed patient to turn her head R to scan for cabinet door to close/open it. Patient reporting onset of "dizziness" coinciding with increase in apparent anxiety with patient as well. She requested to return to her wc. WC was on her L side, patient ambulated past the wc with RW and CGA and was standing behind wc until she noticed she had passed the wc. Patient then stating "well the wheelchair needs to be turned around for me to sit in it." Patient completing scavenger hunt in kitchen searching for items in cabinets based off of recipe on cookie mix. Past often  found to be reading only L side of sentences on the recipe. Poor short term memory complicating patients ability to follow directions to gather items. PT asked patient to "step up into walker" at the counter for improved safety- patient took this literally and made attempts to step up onto something within walker. Discussed with patient difficulties remembering task at hand, scanning to the R to compensate for visual field cut and poor standing endurance in regards to home safety. Patient reporting that she will only cook with family/assistance present. She's beginning to show more insight into her deficits stating when she would need help at home. Patient demo'ing toilet transfer in mock apt. Max verbal cuing needed to find toilet to safely sit on it. Patient making multiple attempts to sit onto nothing when toilet was just to her R. Patient able to turn to face toilet to see its location, but by the time she turned back to transfer to toilet it was as if she had forgotten the location of the toilet already. Patient ambulating back to wc with CGA and RW. She returned to bed via stand pivot with CGA, bed alarm on, call light within reach.   Therapy Documentation Precautions:  Precautions Precautions: Fall Precaution Comments: history of vertigo Restrictions Weight Bearing Restrictions: No    Therapy/Group: Individual Therapy  Karoline Caldwell, PT, DPT, CBIS 09/08/2020, 7:47 AM

## 2020-09-08 NOTE — Progress Notes (Signed)
[facility] PHYSICAL MEDICINE & REHABILITATION PROGRESS NOTE   Subjective/Complaints: No issues overnite , pt aware of d/c Friday , pt says to [facility]   ROS: Patient denies  CP, SOB, N/V/D Objective:   No results found. No results for input(s): WBC, HGB, HCT, PLT in the last 72 hours. No results for input(s): NA, K, CL, CO2, GLUCOSE, BUN, CREATININE, CALCIUM in the last 72 hours.  Intake/Output Summary (Last 24 hours) at 09/08/2020 0815 Last data filed at 09/07/2020 1757 Gross per 24 hour  Intake 400 ml  Output --  Net 400 ml        Physical Exam: Vital Signs Blood pressure (!) 127/57, pulse (!) 57, temperature 98.1 F (36.7 C), temperature source Oral, resp. rate 16, height 5\' 5"  (1.651 m), weight 67.9 kg, SpO2 96 %.  General: No acute distress Mood and affect are appropriate Heart: Regular rate and rhythm no rubs murmurs or extra sounds Lungs: Clear to auscultation, breathing unlabored, no rales or wheezes Abdomen: Positive bowel sounds, soft nontender to palpation, nondistended Extremities: No clubbing, cyanosis, or edema Skin: No evidence of breakdown, no evidence of rash  Neurologic: Cranial nerves II through XII intact, with intact visual fields motor strength is 4 to 4+5 in bilateral deltoid, bicep, tricep, grip, hip flexor, knee extensors, ankle dorsiflexor and plantar flexor normal sensory and coordination Musculoskeletal: Full range of motion in all 4 extremities. No joint swelling     Assessment/Plan: 1. Functional deficits secondary to Left P-O ICH  which require 3+ hours per day of interdisciplinary therapy in a comprehensive inpatient rehab setting.  Physiatrist is providing close team supervision and 24 hour management of active medical problems listed below.  Physiatrist and rehab team continue to assess barriers to discharge/monitor patient progress toward functional and medical goals  Care Tool:  Bathing    Body parts bathed by patient: Right arm,  Left arm, Chest, Abdomen, Front perineal area, Buttocks, Right upper leg, Left upper leg, Face   Body parts bathed by helper: Left lower leg, Right lower leg Body parts n/a: Right lower leg, Left lower leg   Bathing assist Assist Level: Minimal Assistance - Patient > 75%     Upper Body Dressing/Undressing Upper body dressing   What is the patient wearing?: Pull over shirt    Upper body assist Assist Level: Supervision/Verbal cueing    Lower Body Dressing/Undressing Lower body dressing      What is the patient wearing?: Pants, Incontinence brief     Lower body assist Assist for lower body dressing: Moderate Assistance - Patient 50 - 74%     Toileting Toileting    Toileting assist Assist for toileting: Minimal Assistance - Patient > 75%     Transfers Chair/bed transfer  Transfers assist     Chair/bed transfer assist level: Minimal Assistance - Patient > 75%     Locomotion Ambulation   Ambulation assist      Assist level: Contact Guard/Touching assist Assistive device: Walker-rolling Max distance: 100   Walk 10 feet activity   Assist     Assist level: Contact Guard/Touching assist Assistive device: Walker-rolling   Walk 50 feet activity   Assist Walk 50 feet with 2 turns activity did not occur: Safety/medical concerns (patient fatigue)  Assist level: Contact Guard/Touching assist Assistive device: Walker-rolling    Walk 150 feet activity   Assist Walk 150 feet activity did not occur: Safety/medical concerns (patient fatigue)  Assist level: Minimal Assistance - Patient > 75% Assistive device: Walker-rolling  Walk 10 feet on uneven surface  activity   Assist Walk 10 feet on uneven surfaces activity did not occur: Safety/medical concerns (patient fatigue)         Wheelchair     Assist Will patient use wheelchair at discharge?: No Type of Wheelchair: Manual    Wheelchair assist level: Minimal Assistance - Patient > 75% Max  wheelchair distance: 150    Wheelchair 50 feet with 2 turns activity    Assist        Assist Level: Minimal Assistance - Patient > 75%   Wheelchair 150 feet activity     Assist      Assist Level: Minimal Assistance - Patient > 75%   Blood pressure (!) 127/57, pulse (!) 57, temperature 98.1 F (36.7 C), temperature source Oral, resp. rate 16, height 5\' 5"  (1.651 m), weight 67.9 kg, SpO2 96 %.     Medical Problem List and Plan: 1.  Decreased functional mobility with aphasia secondary to left parieto-occipital acute ICH and B/L cerebral infarcts- prior leg braces worn by pt- ?AFO's             -patient may  shower             -ELOS/Goals: 10-14 days- mod I to supervision   -Continue CIR PT, OT, SLP 2.  Antithrombotics: -DVT/anticoagulation: SCDs-  Due to [place]             -antiplatelet therapy: N/A 3. Pain Management: Tylenol as needed  10/10- tylenol prn. Well controlled 4. Mood: Valium 5 mg twice daily as needed anxiety  10/10- switched with Ativan which is better for vertigo-              -antipsychotic agents: N/A 5. Neuropsych: This patient is capable of making decisions on her own behalf. 6. Skin/Wound Care: Routine skin checks 7. Fluids/Electrolytes/Nutrition: Routine in and outs with follow-up chemistries 8.  Hypertension.  Cozaar 50 mg daily, Norvasc 10 mg daily.  Monitor with increased mobility Vitals:   09/07/20 1945 09/08/20 0318  BP: (!) 116/44 (!) 127/57  Pulse: (!) 58 (!) 57  Resp: 16 16  Temp: 97.9 F (36.6 C) 98.1 F (36.7 C)  SpO2: 96% 96%  10/19 improved on cozaar 100mg   9.  Hypothyroidism.  TSH 1.722.  Continue Synthroid 10. Vertigo- has tried acupuncture, vestibular therapy , [facility], meclizine not helpful, has not tried scop patch, discussed with pt and daughter who are willing to try , although given chronicity and lack of response to other anticholinergic agents expectations are low     11.   UTI  pseudomonas, sens to levaquin ( has cephalosporin allergy) , cont  250mg  qd, 1 more day , 5 d tx   -sx resolving  LOS: 12 days A FACE TO FACE EVALUATION WAS PERFORMED  Charlett Blake 09/08/2020, 8:15 AM

## 2020-09-09 ENCOUNTER — Ambulatory Visit (HOSPITAL_COMMUNITY): Payer: Medicare Other | Admitting: *Deleted

## 2020-09-09 ENCOUNTER — Inpatient Hospital Stay (HOSPITAL_COMMUNITY): Payer: Medicare Other | Admitting: Speech Pathology

## 2020-09-09 ENCOUNTER — Inpatient Hospital Stay (HOSPITAL_COMMUNITY): Payer: Medicare Other

## 2020-09-09 ENCOUNTER — Inpatient Hospital Stay (HOSPITAL_COMMUNITY): Payer: Medicare Other | Admitting: Occupational Therapy

## 2020-09-09 NOTE — Progress Notes (Signed)
Patient ID: Tara [name], female   DOB: June 08, 1936, 84 y.o.   MRN: [id number]   SW has spoke with pt daughter regarding her return to her ILF, facility has declined. Daughter has been given list of SNF in [place] and [place]. Pt referral has been sent to SNF in both areas. SW informed daughter the goal is to still d/c Friday. SW awaiting SNF selection from daughter. Sw educated daughter Tara [name] is not definite and is set by AD. Informed daughter d/c date is set by physician and therapy.   [place], [place]

## 2020-09-09 NOTE — Progress Notes (Signed)
Speech Language Pathology Daily Session Note  Patient Details  Name: Tara [name] MRN: [id number] Date of Birth: 1936-07-14  Today's Date: 09/09/2020 SLP Individual Time: 1300-1355 SLP Individual Time Calculation (min): 55 min  Short Term Goals: Week 2: SLP Short Term Goal 1 (Week 2): STG=LTG due to remaining length of stay  Skilled Therapeutic Interventions: Pt was seen for skilled ST targeting cognitive goals. Pt was highly internally distracted today, and SLP assisted her with thought organization to create a list of questions for social worker about recent change in d/c plan to go SNF. Pt still required Moderate verbal cues for redirection after moving on from this task. Pt required Mod-Max A for using a problem solving strategy (despite her ability to generate the idea herself) and locate items from a list within a grocery ad, record the prices, and use of calculator to calculate a total for her items. Pt continues to attempt to mask her cognitive deficits and requires Max A for awareness of impairments besides memory on her daily functioning, in context of acute CVA. Pt left laying in bed with alarm set and needs within reach. Continue per current plan of care.          Pain Pain Assessment Pain Scale: 0-10 Pain Score: 0-No pain  Therapy/Group: Individual Therapy  Arbutus Leas 09/09/2020, 3:01 PM

## 2020-09-09 NOTE — Progress Notes (Signed)
Speech Language Pathology Discharge Summary  Patient Details  Name: Tara [name] MRN: [id number] Date of Birth: Oct 03, 1936  Patient has met 6 of 8 long term goals.  Patient to discharge at overall Mod;Max level.  Reasons goals not met: increased Max A cueing required for recall and emergent awareness, most consistently   Clinical Impression/Discharge Summary:   Pt made slow but some gains and met 6 out of 8 long term goals this admission. Goals were downgraded during admission. Pt currently requires Mod-Max assist for basic functional tasks due to severe impairments impacting her attention, recall, orientation, problem solving, and awareness. She now only requires Supervision A for word finding in functional conversation, and word finding appears mostly tied to cognitive impairments in attention and fluctuating levels of confusion. Pt is consuming regular texture diet with thin liquids and is Mod I for safe swallowing strategies. Pt has demonstrated improved word finding and participation, but also presents with poor frustration tolerance at times. Given current deficits, recommend pt continue to receive skilled ST services upon discharge in addition to strict 24/7 care/supervision. SNF is recommended given high level of care needed and inability for family to provide this at home. Pt and family education is complete at this time.    Care Partner:  Caregiver Able to Provide Assistance: No     Recommendation:  24 hour supervision/assistance;Skilled Nursing facility  Rationale for SLP Follow Up: Maximize functional communication;Maximize cognitive function and independence;Reduce caregiver burden   Equipment: none   Reasons for discharge: Discharged from hospital   Patient/Family Agrees with Progress Made and Goals Achieved: Yes    Arbutus Leas 09/15/2020, 7:26 AM

## 2020-09-09 NOTE — NC FL2 (Addendum)
[place] LEVEL OF CARE SCREENING TOOL     IDENTIFICATION  Patient Name: Tara [name] Birthdate: Apr 19, 1936 Sex: female Admission Date (Current Location): 08/27/2020  [place] and [place] Number:  [place] and Address:  [facility], [place] [address] [place], [place] [address]      Provider Number:    Attending Physician Name and Address:  Charlett Blake, MD  Relative Name and Phone Number:  [name] [phone]    Current Level of Care: Hospital Recommended Level of Care: [place] Prior Approval Number:    Date Approved/Denied:   PASRR Number:   [id number]  Discharge Plan: SNF    Current Diagnoses: Patient Active Problem List   Diagnosis Date Noted  . Pressure injury of skin 08/20/2020  . ICH (intracerebral hemorrhage) ([place]) 08/15/2020  . Anxiety state   . Acute blood loss anemia   . Lumbar radiculopathy 05/28/2019  . Postoperative pain   . Drug induced constipation   . History of TIA (transient ischemic attack)   . Lumbar spinal stenosis 05/23/2019  . HNP (herniated nucleus pulposus), lumbar 05/23/2019  . TIA (transient ischemic attack) 07/02/2018  . Chronic nonintractable headache 07/02/2018  . Onychomycosis due to dermatophyte 05/10/2018  . Other acquired deformity of toe 05/10/2018  . S/P total knee replacement 12/25/2017  . Chronic pain of right knee 10/19/2017  . Ductal carcinoma in situ of right breast 08/17/2015  . Breast cancer of upper-outer quadrant of right female breast ([place]) 08/14/2015  . Right hip pain 10/27/2012  . Low back pain 10/27/2012  . Hypertension   . Osteoarthritis of right knee 11/30/2011  . Baker's cyst of knee 11/30/2011  . Dependent edema 06/19/2011  . Prediabetes 06/19/2011  . Urinary tract infection, recurrent 06/19/2011  . Urinary incontinence 06/19/2011  . Benign positional vertigo 06/19/2011  . Hypothyroidism 06/19/2011  . Tubular adenoma of  colon 06/19/2011  . Essential hypertension, benign 04/15/2011    Orientation RESPIRATION BLADDER Height & Weight     Self, Situation, Place, Time  Normal Continent, Incontinent Weight: 154 lb 15.7 oz (70.3 kg) Height:  5\' 5"  (165.1 cm)  BEHAVIORAL SYMPTOMS/MOOD NEUROLOGICAL BOWEL NUTRITION STATUS      Continent Diet  AMBULATORY STATUS COMMUNICATION OF NEEDS Skin   Limited Assist Verbally Normal                       Personal Care Assistance Level of Assistance              Functional Limitations Info  Sight Sight Info: Impaired        SPECIAL CARE FACTORS FREQUENCY  PT (By licensed PT), OT (By licensed OT), Speech therapy     PT Frequency: 5x a week OT Frequency: 5x a week     Speech Therapy Frequency: 5x a week      Contractures      Additional Factors Info                  Current Medications (09/09/2020):  This is the current hospital active medication list Current Facility-Administered Medications  Medication Dose Route Frequency Provider Last Rate Last Admin  . acetaminophen (TYLENOL) tablet 650 mg  650 mg Oral Q4H PRN Angiulli, Lavon Paganini, PA-C       Or  . acetaminophen (TYLENOL) 160 MG/5ML solution 650 mg  650 mg Per Tube Q4H PRN Angiulli, Lavon Paganini, PA-C  Or  . acetaminophen (TYLENOL) suppository 650 mg  650 mg Rectal Q4H PRN Angiulli, Lavon Paganini, PA-C      . amLODipine (NORVASC) tablet 10 mg  10 mg Oral Daily Cathlyn Parsons, PA-C   10 mg at 09/09/20 0827  . bisacodyl (DULCOLAX) suppository 10 mg  10 mg Rectal Daily PRN Charlett Blake, MD   10 mg at 09/05/20 1021  . cholecalciferol (VITAMIN D3) tablet 2,000 Units  2,000 Units Oral Daily Cathlyn Parsons, PA-C   2,000 Units at 09/09/20 0827  . feeding supplement (BOOST / RESOURCE BREEZE) liquid 1 Container  1 Container Oral TID BM Cathlyn Parsons, PA-C   1 Container at 09/09/20 [phone]  . levothyroxine (SYNTHROID) tablet 75 mcg  75 mcg Oral Daily Cathlyn Parsons, PA-C   75 mcg at  09/09/20 0540  . loratadine (CLARITIN) tablet 10 mg  10 mg Oral Daily Kirsteins, Luanna Salk, MD   10 mg at 09/09/20 0827  . LORazepam (ATIVAN) tablet 0.5 mg  0.5 mg Oral Q6H PRN Lovorn, Megan, MD   0.5 mg at 09/06/20 1637  . losartan (COZAAR) tablet 100 mg  100 mg Oral Daily Kirsteins, Luanna Salk, MD   100 mg at 09/09/20 0827  . meclizine (ANTIVERT) tablet 12.5 mg  12.5 mg Oral TID PRN Angiulli, Lavon Paganini, PA-C      . multivitamin with minerals tablet 1 tablet  1 tablet Oral Daily Cathlyn Parsons, PA-C   1 tablet at 09/09/20 0827  . ondansetron (ZOFRAN) tablet 4 mg  4 mg Oral Q8H PRN Angiulli, Lavon Paganini, PA-C      . polyethylene glycol (MIRALAX / GLYCOLAX) packet 17 g  17 g Oral Daily Cathlyn Parsons, PA-C   17 g at 09/09/20 5670  . scopolamine (TRANSDERM-SCOP) 1 MG/3DAYS 1.5 mg  1 patch Transdermal Q72H Kirsteins, Luanna Salk, MD   1.5 mg at 09/09/20 0952  . senna-docusate (Senokot-S) tablet 2 tablet  2 tablet Oral BID Charlett Blake, MD   2 tablet at 09/09/20 0827     Discharge Medications: Please see discharge summary for a list of discharge medications.  Relevant Imaging Results:  Relevant Lab Results:   Additional Information    [name]

## 2020-09-09 NOTE — Discharge Summary (Signed)
Physician Discharge Summary  Patient ID: Tara [name] MRN: [id number] DOB/AGE: 06/11/1936 84 y.o.  Admit date: 08/27/2020 Discharge date: 09/15/2020  Discharge Diagnoses:  Principal Problem:   ICH (intracerebral hemorrhage) (HCC) Active Problems:   Urinary incontinence   Hypothyroidism   Anxiety state   Vertigo   Essential hypertension   Spastic neurogenic bladder   Discharged Condition: Stable  Significant Diagnostic Studies: N/a   Labs:  Basic Metabolic Panel: BMP Latest Ref Rng & Units 08/28/2020 08/27/2020 08/26/2020  Glucose 70 - 99 mg/dL 108(H) 102(H) 118(H)  BUN 8 - 23 mg/dL 10 12 10  Creatinine 0.44 - 1.00 mg/dL 0.77 0.84 0.85  BUN/Creat Ratio 6 - 22 (calc) - - -  Sodium 135 - 145 mmol/L 140 139 139  Potassium 3.5 - 5.1 mmol/L 3.6 4.4 3.2(L)  Chloride 98 - 111 mmol/L 102 103 103  CO2 22 - 32 mmol/L 29 29 26  Calcium 8.9 - 10.3 mg/dL 9.3 9.1 8.9    CBC: CBC Latest Ref Rng & Units 09/14/2020 08/28/2020 08/27/2020  WBC 4.0 - 10.5 K/uL 6.7 11.2(H) 10.6(H)  Hemoglobin 12.0 - 15.0 g/dL 12.7 12.8 12.4  Hematocrit 36 - 46 % 38.0 39.5 37.5  Platelets 150 - 400 K/uL 238 224 215    Hepatic Function Latest Ref Rng & Units 08/28/2020 08/27/2020 08/15/2020  Total Protein 6.5 - 8.1 g/dL 6.0(L) 5.8(L) 7.3  Albumin 3.5 - 5.0 g/dL 2.8(L) 2.7(L) 3.9  AST 15 - 41 U/L 42(H) 50(H) 43(H)  ALT 0 - 44 U/L 42 46(H) 32  Alk Phosphatase 38 - 126 U/L 65 59 89  Total Bilirubin 0.3 - 1.2 mg/dL 1.0 0.8 1.5(H)   CBG: Recent Labs  Lab 09/11/20 2117  GLUCAP 106*     Brief HPI:   Tara [name] is a 84 y.o. right-handed female with history of right breast cancer with lumpectomy 2016, TIA, hypertension, hypothyroidism, recurrent UTI, bilateral TKA who presented on 08/15/2020 with acute onset of aphasia as well as headache. BP elevated at 216/115.  Cranial CT scan showed acute intraparenchymal hemorrhage centered at the right parieto-occipital region estimated volume 13 cc with minimal  surrounding edema without significant regional mass-effect.  She was evaluated by neurology and treated with hypertonic saline. Follow up MRI brain showed acute left occipital parietal hematoma stable maximal dimension when compared to head CT and small acute/subacute infarct of the bilateral cerebral cortex.  EEG negative for seizure.  Therapy evaluations completed revealing functional deficits and CIR recommended for follow up therapy.    Hospital Course: Tara [name] was admitted to rehab 08/27/2020 for inpatient therapies to consist of PT, ST and OT at least three hours five days a week. Past admission physiatrist, therapy team and rehab RN have worked together to provide customized collaborative inpatient rehab.  Pertaining to patient's left parieto-occipital acute ICH and bilateral cerebral infarcts remained stable patient would follow-up with neurology services.  SCDs were used for DVT prophylaxis.  Her mood was stable with the use of Ativan as needed.  Blood pressures controlled on Cozaar and Norvasc and she would follow-up with her primary MD.  Hormone supplement ongoing for hypothyroidism.    She continues to have bouts of vertigo she is tried acupuncture in the past had evaluations [facility] without results-- meclizine not helpful and scopolamine patch added with improvement in symptoms.  She was found to have  Pseudomonas UTI and treated with 5 day course of Levaquin.  She continues to have urgency   felt to be due to post CVA spastic bladder. Serial CBC showed that reactive leucocytosis has resolved and H/H is stable. Serial check of lytes showed that transient hypokalemia has resolved with brief supplementation and renal status is stable. Abnormal LFTs are resolving. She has made slow gains during her rehab stay but continues to be limited by severe visual perceptual issues as well as fluctuating cognition with bout sof confusion. She requires min to mod assist overall and  family has elected on SNF for further therapies and she was discharged to [facility] on 09/15/20    Physical exam.  Blood pressure 152/65,  pulse 56, temperature 97.6,  respirations 18 oxygen saturation 92% room air Constitutional: WNWD, NAD. HEENT Head.  Normocephalic and atraumatic Neck.  Supple nontender no JVD without thyromegaly Cardiac regular rate rhythm without any extra sounds or murmur heard Abdomen.  Soft nontender positive bowel sounds without rebound Respiratory effort normal no respiratory distress without wheeze Skin.  Warm and dry Musculoskeletal.  Normal range of motion no rigidity Right upper extremity 4+/5 biceps triceps wrist extension grip and finger abduction Left upper extremity 5/5 in same muscles Right lower extremity 4 -/5 hip flexors knee extension dorsiflexion plantarflexion is 4+/5 Left lower extremity 5 -/5 in hip flexors knee extension dorsi and plantar flexion Neurologic.  Alert no acute distress makes good eye contact with examiner.  She does have some mild expressive aphasia but is able to follow simple commands and have a conversation  Rehab course: During patient's stay in rehab weekly team conferences were held to monitor patient's progress, set goals and discuss barriers to discharge. At admission, patient required moderate assist supine to sit moderate assist sit to stand.  Moderate assist 6 feet rolling walker.  Mod assist toilet transfers set up for eating. She has had improvement in activity tolerance, balance, postural control as well as ability to compensate for deficits.   Ambulates to the bathroom rolling walker contact-guard assist.  She does need some verbal cues to attend to the right side.  Contact-guard assist to ambulate back to wheelchair with moderate verbal cues for safety.  Supervision for supine to sit with minimal assist using rolling walker for support.  She was able to negotiate around obstacles on the right without assistance.   Completed transfers over to the shower with minimal assist using rolling walker completed all bathing sit to stand with minimal assist.  She was able to use a basic calendar and interpret information from it to answer basic questions.  Required overall mod to max assist for recall, awareness and basic functional tasks.     Disposition: Skilled Nursing Facility.   Diet: Heart Healthy.   Medications at discharge 1.  Tylenol as needed 2.  Norvasc 10 mg p.o. daily 3.  Vitamin D3 2000 units p.o. daily 4.  Synthroid 75 mcg p.o. daily 5.  Claritin 10 mg p.o. daily 6.  Ativan 0.5 mg p.o. every 6 hours as needed vertigo dizziness 7.  Cozaar 100 mg p.o. daily 8.  Antivert 12.5 mg p.o. 3 times daily as needed dizziness 9.  Multivitamin p.o. daily 10.  MiraLAX 17 gram in 6 ounces fluid daily/hold for loose stools 11.  Scopolamine patch 1.5 mg change every 72 hours as directed 12. Senna S 2 tabs p.o. bid.  13. Boost supplement one p.o tid.   Special Instructions:  1. Recheck CMET in 1-2 weeks to monitor LFTs/lytes.    30-35 minutes were spent completing discharge summary and discharge planning    Discharge Instructions    Ambulatory referral to Neurology   Complete by: As directed    An appointment is requested in approximately 4 weeks [place]   Ambulatory referral to Physical Medicine Rehab   Complete by: As directed    Moderate complexity follow-up 1 to 2 weeks ICH       Follow-up Information    Kirsteins, Luanna Salk, MD Follow up.   Specialty: Physical Medicine and Rehabilitation Why: Office to call for appointment Contact information: [place] [address] [phone]        [facility] Follow up.   Why: for follow up in 2-3 weeks (Dr. Rhea Belton patient) Contact information: [place] [phone]              Signed: Bary Leriche 09/15/2020, 9:09 AM

## 2020-09-09 NOTE — Progress Notes (Signed)
Physical Therapy Session Note  Patient Details  Name: Tara [name] MRN: [id number] Date of Birth: [birth year]  Today's Date: 09/09/2020 PT Individual Time: 1101-1145 PT Individual Time Calculation (min): 44 min  16 missed mins: patient upset after learning that she wasn't accepted into ALF requesting to rest  Short Term Goals: Week 2:  PT Short Term Goal 1 (Week 2): STG=LTG based on ELOS  Skilled Therapeutic Interventions/Progress Updates:    Patient received sitting up in wc with [facility] reps present speaking with patient. She denies pain at this time. Reps requesting to see patient ambulate. She walked ~90ft with RW and CGA. Verbal cues to avoid obstacles on R side. Difficulty navigating doorway into room on R side. Reps asking PT if patient consistently has these issues. PT discussed with patient and reps re: her visual field cut, decreased awareness, poor memory for carryover of compensatory strategies. Reps asking if patient can use call light appropriately. PT discussed that, at this time, patient does not consistently use call light appropriately. Reps told patient that they are declining her for ALF and recommending SNF at this time. Patient became very upset, requesting that PT call dtr. Dtr became very upset saying "you guys don't care where she goes because she's not your mom." PT educated patient and dtr on dc planning for which destination will ensure safest outcome for patient. Patient requesting to use restroom and was able to ambulate into bathroom with CGA and verbal cuing. She requested to return to bed to rest and talk to her dtr some more as she was upset about the dc destination. Bed alarm on, call light within reach.   Therapy Documentation Precautions:  Precautions Precautions: Fall Precaution Comments: history of vertigo Restrictions Weight Bearing Restrictions: No    Therapy/Group: Individual Therapy  Karoline Caldwell, PT, DPT,  CBIS 09/09/2020, 7:46 AM

## 2020-09-09 NOTE — Patient Care Conference (Signed)
Inpatient RehabilitationTeam Conference and Plan of Care Update Date: 09/09/2020   Time: 10:40 AM    Patient Name: Tara [name] Record Number: [id number]  Date of Birth: 1936-11-06 Sex: Female         Room/Bed: 4W26C/4W26C-01 Payor Info: Payor: [place] / Plan: [employer] MEDICARE / Product Type: *No Product type* /    Admit Date/Time:  08/27/2020  5:41 PM  Primary Diagnosis:  ICH (intracerebral hemorrhage) [facility])  Hospital Problems: Principal Problem:   ICH (intracerebral hemorrhage) [facility])    Expected Discharge Date: Expected Discharge Date: 09/11/20  Team Members Present: Physician leading conference: Dr. Alysia Penna Care Coodinator Present: Dorien Chihuahua, RN, BSN, CRRN;Christina Sampson Goon, [place] Nurse Present: Other (comment) Arva Chafe, RN) PT Present: Stacy Gardner, PT OT Present: Clyda Greener, OT SLP Present: Jettie Booze, CF-SLP PPS Coordinator present : Gunnar Fusi, Novella Olive, PT     Current Status/Progress Goal Weekly Team Focus  Bowel/Bladder   Patient continent of bowel and bladder Walking to bathroom to void LBM 10/19 self reported  to maintain continence  Assess Toileting Q2 and PRN   Swallow/Nutrition/ Hydration   Mod I, regular textures, full supervision due to cognition  Mod I  carryover safe swallow strategies to work toward reducing supervision   ADL's   Supervision with cueing for UB selfcare, Min assist for LB bathign with mod assist for LB dresssing.  Transfers are min assist as well to the walk-in shower as well as the toilet with use of the RW.  Right visual field cut as well with visual peceptual deficits noted with dressing tasks.  supervision overall  selfcare retraining, transfer training, visual compensation, cogntive retraining, transfer training, DME education, pt/family education   Mobility   SPV bed momility, CGA STS, CGA transfers, CGA gait up to 166ft, MinA wc mobility d/t visual field cut  SPV-CGA  overall  R attention, gait, transfers, endurance, dc planning, family ed, dynamic balance   Communication   Supervision-Min A word finding and functional expression of wants/needs, can flucutate with levels of confusion  Supervision  word finding in functional conversation and expressing needs/wants   Safety/Cognition/ Behavioral Observations  Mod-Max, slow minimal progress this week due to severity of memory deficits impacting other aspects of cognitive function  Mod (downgraded)  memory strategies and carryover, intellectual and error awareness, functional familiar problem solving, education, orientation aids   Pain   no complaints of pain  To remain pain free.  Assess pain Q4 hours and PRN   Skin   Healed abrasion to LLE  Prevent further skin breakdown  Assess skin Qshift and PRN     Discharge Planning:  Goal to dishcharge back to ILF/ALF (assesment on Wednesday)   Team Discussion: Note poor awareness, confusion memory issues, requires cues for orientation, attention issues, dizziness. Abx course completed for dysuria. Continue to note post loss of balance and right visual field cut,  And apraxia; missing sight of parts of clothing when dressing.   Patient on target to meet rehab goals: yes, currently supervision for UB B+D, min guard for LB bathing and mod assist for dressing. Supervision sit to stand and CGA 100' with RW. Min assist goals set.  *See Care Plan and progress notes for long and short-term goals.   Revisions to Treatment Plan:  Downgraded SLP goals for problem solving, awareness and memory.   Teaching Needs: Transfers, toileting, medications, etc.   Current Barriers to Discharge: Decreased caregiver support  and Memory and awareness issues   Possible Resolutions to Barriers:  Family education    Medical Summary Current Status: Blood pressure still labile but overall under better control, continues to have cognitive deficits. She complains of constipation but is  having bowel movements daily  Barriers to Discharge: Other (comments)  Barriers to Discharge Comments: Cognition with poor safety awareness, right hemisensory deficits Possible Resolutions to [employer] Focus: Continue rehab, will need close supervision at assisted living facility post discharge   Continued Need for Acute Rehabilitation Level of Care: The patient requires daily medical management by a physician with specialized training in physical medicine and rehabilitation for the following reasons: Direction of a multidisciplinary physical rehabilitation program to maximize functional independence : Yes Medical management of patient stability for increased activity during participation in an intensive rehabilitation regime.: Yes Analysis of laboratory values and/or radiology reports with any subsequent need for medication adjustment and/or medical intervention. : Yes   I attest that I was present, lead the team conference, and concur with the assessment and plan of the team.   Dorien Chihuahua B 09/09/2020, 3:12 PM

## 2020-09-09 NOTE — Progress Notes (Signed)
Physical Therapy Session Note  Patient Details  Name: Tara [name] MRN: [id number] Date of Birth: 1936/05/07  Today's Date: 09/09/2020 PT Individual Time: 1455-1537 PT Individual Time Calculation (min): 42 min   Short Term Goals: Week 2:  PT Short Term Goal 1 (Week 2): STG=LTG based on ELOS  Skilled Therapeutic Interventions/Progress Updates:    PT initially entered room with PA, dtr and patient talking about denial from ALF requesting some additional time to speak to PA. When PT returned to room, dtr visibly upset asking for "clarification" from PT as to why patient was denied by ALF, stating that the patient "appears to be moving good enough." PT educating dtr and patient on patients current cognitive deficits and carryover to functional mobility, including R visual field cut, poor motor planning, poor insight, poor safety awareness, poor memory, apraxia. PT mentioned that patient does not consistently use call light appropriately and this PTs understanding is that ALF requires use of call light system to receive assistance and that it's not strict 24/7 supervision. Patient able to demo inappropriate use of call light as she was prompted to push call button, but was trying to turn TV on instead. Dtr asking if patient could be extended to stay at CIR longer to work on remaining deficits, PT deferring to Dr and SW. SW present part of session to discuss SNF options vs extending stay with dtr. Patient agreeable to leaving room for remainder of therapy session. She was able to stand at table to complete pipe tree structure based off of picture. Max verbal cuing and hand over hand at times needed to complete structure accurately. Patient making multiple attempts to connect pipes in the bucket as opposed to building the structure from the platform despite verbal cuing. Patient returning to room, CGA stand pivot to bed, bed alarm on, call light within reach, dtr at bedside.   Therapy  Documentation Precautions:  Precautions Precautions: Fall Precaution Comments: history of vertigo Restrictions Weight Bearing Restrictions: No    Therapy/Group: Individual Therapy  Karoline Caldwell, PT, DPT, CBIS 09/09/2020, 7:45 AM

## 2020-09-09 NOTE — Progress Notes (Signed)
Occupational Therapy Session Note  Patient Details  Name: Tara [name] MRN: [id number] Date of Birth: [birth year]  Today's Date: 09/09/2020 OT Individual Time: 8527-7824 OT Individual Time Calculation (min): 44 min    Short Term Goals: Week 2:  OT Short Term Goal 1 (Week 2): Continue working on established LTGs until discharge.  Skilled Therapeutic Interventions/Progress Updates:    Pt sitting in the wheelchair to start session.  She worked on orientation as she thought it was Thursday initially, stating she was going home tomorrow.  Therapist had her work on Educational psychologist.  Pt was able to detect all movement in the left periphery when testing each eye independently.  She still exhibited right visual field loss without being able to identify a finger moving in the right field. She did complete a line bisection test with overall good compensation and 4/5 lines correct.  Had her then work on Engineer, manufacturing for reading and word search puzzle.  Integrated use of a sheet of paper to isolate each line of scanning or reading. She would still miss words or letters in the line. Also had her work on using her finger to guide in addition to the isolation of the line. Mod instructional cueing to move finger along and then read the word. Encouraged her to continue working on this.  Finished session with pt in the wheelchair and with the call button and phone in reach.  Safety belt in place as well.    Therapy Documentation Precautions:  Precautions Precautions: Fall Precaution Comments: history of vertigo Restrictions Weight Bearing Restrictions: No  Pain: Pain Assessment Pain Scale: Faces Pain Score: 0-No pain ADL: See Care Tool Section for some details of mobility and selfcare  Therapy/Group: Individual Therapy  Santanna Olenik OTR/L 09/09/2020, 10:47 AM

## 2020-09-09 NOTE — Progress Notes (Signed)
Patient ID: Tara [name], female   DOB: [birth year], 84 y.o.   MRN: [id number]  Team Conference Report to Patient/Family  Team Conference discussion was reviewed with the patient and caregiver, including goals, any changes in plan of care and target discharge date.  Patient and caregiver express understanding and are in agreement.  The patient has a target discharge date of 09/11/20.  [name] 09/09/2020, 1:42 PM

## 2020-09-09 NOTE — Progress Notes (Addendum)
[facility] PHYSICAL MEDICINE & REHABILITATION PROGRESS NOTE   Subjective/Complaints:  Some constipation noted but has had daily BMs per RN   ROS: Patient denies  CP, SOB, N/V/D Objective:   No results found. No results for input(s): WBC, HGB, HCT, PLT in the last 72 hours. No results for input(s): NA, K, CL, CO2, GLUCOSE, BUN, CREATININE, CALCIUM in the last 72 hours.  Intake/Output Summary (Last 24 hours) at 09/09/2020 3662 Last data filed at 09/09/2020 0806 Gross per 24 hour  Intake 876 ml  Output --  Net 876 ml        Physical Exam: Vital Signs Blood pressure (!) 141/65, pulse 60, temperature 97.7 F (36.5 C), resp. rate 18, height _0  (1.651 m), weight 70.3 kg, SpO2 98 %.  General: No acute distress Mood and affect are appropriate Heart: Regular rate and rhythm no rubs murmurs or extra sounds Lungs: Clear to auscultation, breathing unlabored, no rales or wheezes Abdomen: Positive bowel sounds, soft nontender to palpation, nondistended Extremities: No clubbing, cyanosis, or edema Skin: No evidence of breakdown, no evidence of rash  Neurologic: Cranial nerves II through XII intact, with intact visual fields motor strength is 4 to 4+5 in bilateral deltoid, bicep, tricep, grip, hip flexor, knee extensors, ankle dorsiflexor and plantar flexor normal sensory and coordination Musculoskeletal: Full range of motion in all 4 extremities. No joint swelling     Assessment/Plan: 1. Functional deficits secondary to Left P-O ICH  which require 3+ hours per day of interdisciplinary therapy in a comprehensive inpatient rehab setting.  Physiatrist is providing close team supervision and 24 hour management of active medical problems listed below.  Physiatrist and rehab team continue to assess barriers to discharge/monitor patient progress toward functional and medical goals  Care Tool:  Bathing    Body parts bathed by patient: Right arm, Left arm, Chest, Abdomen, Front  perineal area, Buttocks, Right upper leg, Left upper leg, Face, Right lower leg, Left lower leg   Body parts bathed by helper: Left lower leg, Right lower leg Body parts n/a: Right lower leg, Left lower leg   Bathing assist Assist Level: Minimal Assistance - Patient > 75%     Upper Body Dressing/Undressing Upper body dressing   What is the patient wearing?: Pull over shirt, Bra    Upper body assist Assist Level: Minimal Assistance - Patient > 75%    Lower Body Dressing/Undressing Lower body dressing      What is the patient wearing?: Pants, Incontinence brief     Lower body assist Assist for lower body dressing: Moderate Assistance - Patient 50 - 74%     Toileting Toileting    Toileting assist Assist for toileting: Minimal Assistance - Patient > 75%     Transfers Chair/bed transfer  Transfers assist     Chair/bed transfer assist level: Contact Guard/Touching assist     Locomotion Ambulation   Ambulation assist      Assist level: Contact Guard/Touching assist Assistive device: Walker-rolling Max distance: 25   Walk 10 feet activity   Assist     Assist level: Contact Guard/Touching assist Assistive device: Walker-rolling   Walk 50 feet activity   Assist Walk 50 feet with 2 turns activity did not occur: Safety/medical concerns (patient fatigue)  Assist level: Contact Guard/Touching assist Assistive device: Walker-rolling    Walk 150 feet activity   Assist Walk 150 feet activity did not occur: Safety/medical concerns (patient fatigue)  Assist level: Minimal Assistance - Patient > 75% Assistive device:  Walker-rolling    Walk 10 feet on uneven surface  activity   Assist Walk 10 feet on uneven surfaces activity did not occur: Safety/medical concerns (patient fatigue)         Wheelchair     Assist Will patient use wheelchair at discharge?: No Type of Wheelchair: Manual    Wheelchair assist level: Minimal Assistance - Patient >  75% Max wheelchair distance: 150    Wheelchair 50 feet with 2 turns activity    Assist        Assist Level: Minimal Assistance - Patient > 75%   Wheelchair 150 feet activity     Assist      Assist Level: Minimal Assistance - Patient > 75%   Blood pressure (!) 141/65, pulse 60, temperature 97.7 F (36.5 C), resp. rate 18, height _0  (1.651 m), weight 70.3 kg, SpO2 98 %.     Medical Problem List and Plan: 1.  Decreased functional mobility with aphasia secondary to left parieto-occipital acute ICH and B/L cerebral infarcts- prior leg braces worn by pt- ?AFO's             -patient may  shower             -ELOS/Goals: 10-14 days- mod I to supervision Team conference today please see physician documentation under team conference tab, met with team  to discuss problems,progress, and goals. Formulized individual treatment plan based on medical history, underlying problem and comorbidities.   -Continue CIR PT, OT, SLP 2.  Antithrombotics: -DVT/anticoagulation: SCDs-  Due to [place]             -antiplatelet therapy: N/A 3. Pain Management: Tylenol as needed  10/10- tylenol prn. Well controlled 4. Mood: Valium 5 mg twice daily as needed anxiety  10/10- switched with Ativan which is better for vertigo-              -antipsychotic agents: N/A 5. Neuropsych: This patient is capable of making decisions on her own behalf. 6. Skin/Wound Care: Routine skin checks 7. Fluids/Electrolytes/Nutrition: Routine in and outs with follow-up chemistries 8.  Hypertension.  Cozaar 50 mg daily, Norvasc 10 mg daily.  Monitor with increased mobility Vitals:   09/08/20 1951 09/09/20 0424  BP: (!) 108/59 (!) 141/65  Pulse: 64 60  Resp: 18 18  Temp: 98.4 F (36.9 C) 97.7 F (36.5 C)  SpO2: 97% 98%  10/19 improved on cozaar 122m  9.  Hypothyroidism.  TSH 1.722.  Continue Synthroid 10. Vertigo-improved with scopolamine patch  11.  UTI  pseudomonas, sens to levaquin ( has cephalosporin  allergy) , cont  2519mqd, completed , 5 d tx   -sx resolving  LOS: 13 days A FACE TO FACE EVALUATION WAS PERFORMED  AnCharlett Blake0/20/2021, 8:22 AM

## 2020-09-10 ENCOUNTER — Inpatient Hospital Stay (HOSPITAL_COMMUNITY): Payer: Medicare Other | Admitting: Occupational Therapy

## 2020-09-10 ENCOUNTER — Inpatient Hospital Stay (HOSPITAL_COMMUNITY): Payer: Medicare Other | Admitting: Physical Therapy

## 2020-09-10 ENCOUNTER — Inpatient Hospital Stay (HOSPITAL_COMMUNITY): Payer: Medicare Other | Admitting: Speech Pathology

## 2020-09-10 ENCOUNTER — Inpatient Hospital Stay (HOSPITAL_COMMUNITY): Payer: Medicare Other

## 2020-09-10 MED ORDER — CHOLECALCIFEROL 50 MCG (2000 UT) PO CAPS
2000.0000 [IU] | ORAL_CAPSULE | Freq: Every day | ORAL | 1 refills | Status: AC
Start: 2020-09-10 — End: ?

## 2020-09-10 MED ORDER — LEVOTHYROXINE SODIUM 75 MCG PO TABS
75.0000 ug | ORAL_TABLET | Freq: Every day | ORAL | 0 refills | Status: AC
Start: 2020-09-10 — End: ?

## 2020-09-10 MED ORDER — LORAZEPAM 0.5 MG PO TABS: 0.5000 mg | ORAL_TABLET | Freq: Four times a day (QID) | ORAL | 0 refills | Status: DC | PRN

## 2020-09-10 MED ORDER — MECLIZINE HCL 12.5 MG PO TABS
12.5000 mg | ORAL_TABLET | Freq: Three times a day (TID) | ORAL | 0 refills | Status: AC | PRN
Start: 2020-09-10 — End: ?

## 2020-09-10 MED ORDER — POLYETHYLENE GLYCOL 3350 17 G PO PACK
17.0000 g | PACK | Freq: Every day | ORAL | 0 refills | Status: AC
Start: 2020-09-10 — End: ?

## 2020-09-10 MED ORDER — AMLODIPINE BESYLATE 10 MG PO TABS
10.0000 mg | ORAL_TABLET | Freq: Every day | ORAL | 1 refills | Status: AC
Start: 2020-09-10 — End: ?

## 2020-09-10 MED ORDER — LOSARTAN POTASSIUM 100 MG PO TABS
100.0000 mg | ORAL_TABLET | Freq: Every day | ORAL | 0 refills | Status: AC
Start: 2020-09-10 — End: ?

## 2020-09-10 MED ORDER — SCOPOLAMINE 1 MG/3DAYS TD PT72
1.0000 | MEDICATED_PATCH | TRANSDERMAL | 12 refills | Status: AC
Start: 2020-09-12 — End: ?

## 2020-09-10 NOTE — Progress Notes (Signed)
Speech Language Pathology Daily Session Note  Patient Details  Name: Tara [name] MRN: [id number] Date of Birth: 04-Apr-1936  Today's Date: 09/10/2020 SLP Individual Time: 1030-1127 SLP Individual Time Calculation (min): 57 min  Short Term Goals: Week 2: SLP Short Term Goal 1 (Week 2): STG=LTG due to remaining length of stay  Skilled Therapeutic Interventions: Pt was seen for skilled ST targeting cognitive-linguistic goals. SLP facilitated session by administering the SLUMS as post test and reassessment of cognition for this admission. Pt scored 14/30, which is indicative of persistent severe neurocognitive deficits - most remarkable for working and short term memory, problem solving and attention. Pt's paragraph recall > than 5 word delayed recall. She also stated she normally uses word association as memory strategy at baseline and was aware she was unable to do that today during 5-word delayed recall task. Overall Min A semantic cues for word finding and Mod A for thought organization provided for pt to describe and "teach" SLP how to play Bridge card game. Also introduced novel basic card task (11 up), during which pt required Mod A for recall of rules and Mod faded to [place] A for basic problem solving. Pt left sitting in chair with alarm set and needs within reach. Continue per current plan of care.          Pain Pain Assessment Pain Scale: 0-10 Pain Score: 0-No pain  Therapy/Group: Individual Therapy  Arbutus Leas 09/10/2020, 12:00 PM

## 2020-09-10 NOTE — Progress Notes (Signed)
Physical Therapy Session Note  Patient Details  Name: MAELEE HOOT MRN: [id number] Date of Birth: Sep 10, 1936  Today's Date: 09/10/2020 PT Individual Time: 1335-1405 PT Individual Time Calculation (min): 30 min  and Today's Date: 09/10/2020 PT Missed Time: 15 Minutes Missed Time Reason: Patient unwilling to participate;Other (Comment) (Patient and son requesting "short session" to work on deciding which SNF to dc to despite ed on importance of all therapy she can get before dc)  Short Term Goals: Week 2:  PT Short Term Goal 1 (Week 2): STG=LTG based on ELOS  Skilled Therapeutic Interventions/Progress Updates:    Patient received sitting up in wc agreeable to PT. She denies pain, but expresses frustration at having therapy today. PT educated patient on importance of taking advantage of all therapy sessions prior to dc to maximize potential outcomes. Patient insisting on "short session." PT propelling patient to therapy gym for time management and energy conservation. She was able to transfer into standard height car with CGA and verbal cuing for safety. Patient making multiple attempts to abandon walker and use car door for assist. PT educating patient on safe practices for car transfers and not using door as it may close on her. Patient verbalized understanding. Patient able to negotiate 1 (6") step with CGA and verbal cuing. She was able to utilize R hand rail, but did step all the way to the left side of the curb due to R visual field and inability to see R side of step. Patient requesting to use restroom in her room. CGA with verbal cuing to ambulate to bathroom x10 ft with RW. Max verbal cuing needed as bathroom was on her R side. MinA for clothing management, supervision for pericare. Patient requiring max verbal cuing to find sink to complete hand hygiene and to find bed on R side. Patient requesting to remain in bed to research SNFs with son. Bed alarm on, call light within reach.    Therapy Documentation Precautions:  Precautions Precautions: Fall Precaution Comments: history of vertigo Restrictions Weight Bearing Restrictions: No    Therapy/Group: Individual Therapy  Karoline Caldwell, PT, DPT, CBIS 09/10/2020, 7:42 AM

## 2020-09-10 NOTE — Progress Notes (Signed)
Patient ID: Tara [name], female   DOB: [birth year], 84 y.o.   MRN: [id number]  Met with patient son provided contact information for [employer]. Son will be going to tour facility at [facility]. Bed offer available. Pending insurance authorization. Insurance requesting peer to peer today (in hopes of making a decision) deadline is tomorrow, 10/22 by 9 AM. Sw has informed physician.   Peer to peer line: [phone] Option 5 Deadline: 10/22, 9AM

## 2020-09-10 NOTE — Progress Notes (Signed)
Recreational Therapy Discharge Summary Patient Details  Name: Tara [name] MRN: [id number] Date of Birth: 1935-12-28 Today's Date: 09/10/2020  Long term goals set: 1  Long term goals met: 0  Comments on progress toward goals: Pt with planned discharge to [facility] 10/22.  Pt participated in evaluation session this admission discussing leisure interests given additional time for expression and verbal cuing to attend to leisure screen.  Suspect pt will require min assist to physically complete simple TR task, but a varying degree of cuing due to cognitive and visual deficits.  Goal not met as it was not observed by LRT.  Reasons for discharge: discharge from hospital  Follow-up: Encourage particiaption in recreational therpay and/oractivities programming at SNF for social,emotional, cognitive, spiritual & physical support.    Patient/family agrees with progress made and goals achieved: Yes  [name] 09/10/2020, 12:40 PM

## 2020-09-10 NOTE — Progress Notes (Signed)
Recreational Therapy Session Note  Patient Details  Name: Tara [name] MRN: [id number] Date of Birth: September 04, 1936 Today's Date: 09/10/2020  Pain: no c/o Skilled Therapeutic Interventions/Progress Updates: Pt in bed upon entry, nursing at bedside administering morning medications, breakfast tray in front of pt.  Session cancelled to allow pt to consume breakfast before next therapy session. [place] 09/10/2020, 12:36 PM

## 2020-09-10 NOTE — Progress Notes (Signed)
Patient ID: Tara [name], female   DOB: 1936/10/17, 84 y.o.   MRN: [id number]  SW attempted to call patient son, left voicemail.  [place], [place]

## 2020-09-10 NOTE — Progress Notes (Signed)
Patient ID: Tara [name], female   DOB: 1936/04/18, 84 y.o.   MRN: [id number] Met with the patient to review role of the nurse case manager and collaboration with the SW to prepare for discharge. Reviewed risks for secondary stroke including HTN and APT at discharge. Reviewed cooking with less salt and eating after a stroke. Patient given handouts on information reviewed and stated an understanding of information reviewed. Margarito Liner

## 2020-09-10 NOTE — Progress Notes (Signed)
Occupational Therapy Session Note  Patient Details  Name: Tara [name] MRN: [id number] Date of Birth: 10-10-1936  Today's Date: 09/10/2020 OT Individual Time: 8341-9622 OT Individual Time Calculation (min): 60 min    Short Term Goals: Week 2:  OT Short Term Goal 1 (Week 2): Continue working on established LTGs until discharge.  Skilled Therapeutic Interventions/Progress Updates:    Pt worked on toileting, bathing, and dressing during OT session.  She was able to transfer to the EOB with supervision and then needed min assist for sit to stand from the EOB with min instructional cueing for hand placement.  She then walked to the bathroom with min assist needed secondary to LOB to the left when attempting to turn the opposite direction needed to align herself with the toilet.  She ran into the door opening and the grab bar on the right side, but was able to negotiate around them after making contact.  She was able to complete toileting with overall min assist and then transfer over to the tub bench in the walk-in shower.  She needed mod instructional cueing to align herself with the surface of the tub bench she was trying to sit down on.  She was able to wash up but demonstrated increased motor planning difficulty when trying to manipulate the hand held shower as well as coordinate use of the washcloth.  After washing her hair, she put some soap in her hand.  When therapist asked her if she was going to use the washcloth, she did not respond and instead placed more soap on her hair that had just been washed.  Throughout session she would get mixed up with where the water was as she would hand the shower head to the therapist on the left side, but when needing to rinse her washcloth out, she would hold it up in the air as if there was a shower on the wall in front of her.  This would happen at times, even though the water was hitting her arm on the left side.  She was able to complete shower with mod  instructional cueing to scoot back on the shower seat as she would inch closer to the edge without awareness of this.  She transferred out to the wheelchair with min assist and posterior LOB using the RW.  Next, she was able to complete UB dressing,donning her sports bra with setup assistance.  She demonstrated decreased orientation and sequencing with attempts to donn the pullover shirt and therapist had to assist her with donning it over the right arm.  Max assist was needed for donning pants as well sit to stand secondary to not being able to place her feet into the waistband of the pants.  Pt with decreased awareness when she would attempt this that her foot was not in the pants.  Finished session with therapist assist to donn TEDs and gripper socks. She was left sitting up in the wheelchair with the call button and phone in reach and safety belt in place.    Therapy Documentation Precautions:  Precautions Precautions: Fall Precaution Comments: history of vertigo, R field cut Restrictions Weight Bearing Restrictions: No  Pain: Pain Assessment Pain Scale: Faces Pain Score: 0-No pain ADL: See Care Tool Section for some details of mobility and selfcare  Therapy/Group: Individual Therapy  Guthrie Lemme OTR/L 09/10/2020, 4:46 PM

## 2020-09-10 NOTE — Progress Notes (Signed)
[name] PHYSICAL MEDICINE & REHABILITATION PROGRESS NOTE   Subjective/Complaints:  No issues overnite   ROS: Patient denies  CP, SOB, N/V/D Objective:   No results found. No results for input(s): WBC, HGB, HCT, PLT in the last 72 hours. No results for input(s): NA, K, CL, CO2, GLUCOSE, BUN, CREATININE, CALCIUM in the last 72 hours. No intake or output data in the 24 hours ending 09/10/20 0837      Physical Exam: Vital Signs Blood pressure 137/70, pulse 61, temperature 97.6 F (36.4 C), resp. rate 18, height 5\' 5"  (1.651 m), weight 70.2 kg, SpO2 98 %.   General: No acute distress Mood and affect are appropriate Heart: Regular rate and rhythm no rubs murmurs or extra sounds Lungs: Clear to auscultation, breathing unlabored, no rales or wheezes Abdomen: Positive bowel sounds, soft nontender to palpation, nondistended Extremities: No clubbing, cyanosis, or edema Skin: No evidence of breakdown, no evidence of rash  Neurologic: Cranial nerves II through XII intact, with intact visual fields motor strength is 4 to 4+5 in bilateral deltoid, bicep, tricep, grip, hip flexor, knee extensors, ankle dorsiflexor and plantar flexor normal sensory and coordination Musculoskeletal: Full range of motion in all 4 extremities. No joint swelling     Assessment/Plan: 1. Functional deficits secondary to Left P-O ICH  which require 3+ hours per day of interdisciplinary therapy in a comprehensive inpatient rehab setting.  Physiatrist is providing close team supervision and 24 hour management of active medical problems listed below.  Physiatrist and rehab team continue to assess barriers to discharge/monitor patient progress toward functional and medical goals  Care Tool:  Bathing    Body parts bathed by patient: Right arm, Left arm, Chest, Abdomen, Front perineal area, Buttocks, Right upper leg, Left upper leg, Face, Right lower leg, Left lower leg   Body parts bathed by helper: Left lower  leg, Right lower leg Body parts n/a: Right lower leg, Left lower leg   Bathing assist Assist Level: Minimal Assistance - Patient > 75%     Upper Body Dressing/Undressing Upper body dressing   What is the patient wearing?: Pull over shirt, Bra    Upper body assist Assist Level: Minimal Assistance - Patient > 75%    Lower Body Dressing/Undressing Lower body dressing      What is the patient wearing?: Pants, Incontinence brief     Lower body assist Assist for lower body dressing: Moderate Assistance - Patient 50 - 74%     Toileting Toileting    Toileting assist Assist for toileting: Minimal Assistance - Patient > 75%     Transfers Chair/bed transfer  Transfers assist     Chair/bed transfer assist level: Contact Guard/Touching assist     Locomotion Ambulation   Ambulation assist      Assist level: Contact Guard/Touching assist Assistive device: Walker-rolling Max distance: 50   Walk 10 feet activity   Assist     Assist level: Contact Guard/Touching assist Assistive device: Walker-rolling   Walk 50 feet activity   Assist Walk 50 feet with 2 turns activity did not occur: Safety/medical concerns (patient fatigue)  Assist level: Contact Guard/Touching assist Assistive device: Walker-rolling    Walk 150 feet activity   Assist Walk 150 feet activity did not occur: Safety/medical concerns (patient fatigue)  Assist level: Minimal Assistance - Patient > 75% Assistive device: Walker-rolling    Walk 10 feet on uneven surface  activity   Assist Walk 10 feet on uneven surfaces activity did not occur: Safety/medical concerns (  patient fatigue)         Wheelchair     Assist Will patient use wheelchair at discharge?: No Type of Wheelchair: Manual    Wheelchair assist level: Minimal Assistance - Patient > 75% Max wheelchair distance: 150    Wheelchair 50 feet with 2 turns activity    Assist        Assist Level: Minimal Assistance  - Patient > 75%   Wheelchair 150 feet activity     Assist      Assist Level: Minimal Assistance - Patient > 75%   Blood pressure 137/70, pulse 61, temperature 97.6 F (36.4 C), resp. rate 18, height 5\' 5"  (1.651 m), weight 70.2 kg, SpO2 98 %.     Medical Problem List and Plan: 1.  Decreased functional mobility with aphasia secondary to left parieto-occipital acute ICH and B/L cerebral infarcts- prior leg braces worn by pt- ?AFO's             -patient may  shower             -ELOS/Goals: still requiring 24/7 sup/minA- needing SNF instead of ALF  -Continue CIR PT, OT, SLP 2.  Antithrombotics: -DVT/anticoagulation: SCDs-  Due to [place]             -antiplatelet therapy: N/A 3. Pain Management: Tylenol as needed  10/10- tylenol prn. Well controlled 4. Mood: Valium 5 mg twice daily as needed anxiety  10/10- switched with Ativan which is better for vertigo-              -antipsychotic agents: N/A 5. Neuropsych: This patient is capable of making decisions on her own behalf. 6. Skin/Wound Care: Routine skin checks 7. Fluids/Electrolytes/Nutrition: Routine in and outs with follow-up chemistries 8.  Hypertension.  Cozaar 50 mg daily, Norvasc 10 mg daily.  Monitor with increased mobility Vitals:   09/09/20 1358 09/10/20 0459  BP: (!) 124/56 137/70  Pulse: 64 61  Resp: 18 18  Temp:  97.6 F (36.4 C)  SpO2: 99% 98%  10/21 improved on cozaar 100mg   9.  Hypothyroidism.  TSH 1.722.  Continue Synthroid 10. Vertigo-improved with scopolamine patch  11.  UTI  pseudomonas, sens to levaquin ( has cephalosporin allergy) , cont  250mg  qd, completed , 5 d tx   -sx resolved  LOS: 14 days A FACE TO [place] [name] 09/10/2020, 8:37 AM

## 2020-09-10 NOTE — Progress Notes (Signed)
Physical Therapy Session Note  Patient Details  Name: Tara [name] MRN: [id number] Date of Birth: 1936/01/23  Today's Date: 09/10/2020 PT Individual Time: 1142-1213 PT Individual Time Calculation (min): 31 min   Short Term Goals: Week 2:  PT Short Term Goal 1 (Week 2): STG=LTG based on ELOS  Skilled Therapeutic Interventions/Progress Updates:    Pt received sitting in w/c and agreeable to therapy session. Transported to/from gym in w/c for time management and energy conservation. Participated in standing balance, standing tolerance, and dual-cognitive task of playing "Go Fish" without UE support. Prior to standing had pt recall rules of the game while seated to assist with orientation to task. While standing, pt demoed minor anterior/posterior postural sway requiring use of ankle strategy to maintain balance with close supervision; however, with fatigue pt had moderate anterior trunk LOB requiring min/mod assist to recover and then return to sitting in w/c. Had pt deal the cards with pt demoing R inattention being aware of therapist on that side (dealing a card for therapist) but placing the card only to midline (not to the right) requiring max repeated cuing to correct. While playing, pt required repeated cuing to recall rules of the game and demonstrated inability carryover those rules into creating a strategy for how to improve her score/increase her chances of winning - also unable to recall which cards are hers and what card she had just asked her opponents for in the previous round. Transported pt back to room in w/c and left seated with needs in reach, seat belt alarm on, her son present, and NT present to assist pt with meal.   Therapy Documentation Precautions:  Precautions Precautions: Fall Precaution Comments: history of vertigo Restrictions Weight Bearing Restrictions: No  Pain:   No reports of pain throughout session.   Therapy/Group: Individual Therapy  Tawana Scale ,  PT, DPT, CSRS  09/10/2020, 8:03 AM

## 2020-09-10 NOTE — Progress Notes (Signed)
Physical Therapy Discharge Summary  Patient Details  Name: Tara [name] MRN: [id number] Date of Birth: 10/10/1936  Patient has met 59 of 10 long term goals due to improved activity tolerance, improved balance, improved postural control, increased strength, functional use of  right lower extremity and left lower extremity, improved attention, improved awareness and improved coordination.  Patient to discharge at an ambulatory level [place].   Patient's care partner requires assistance to provide the necessary cognitive assistance at discharge.    Recommendation:  Patient will benefit from ongoing skilled PT services in skilled nursing facility setting to continue to advance safe functional mobility, address ongoing impairments in memory, attention, awareness, motor planning/sequencing, balance, B LE functional strength, endurance, efficient gait mechanics, and minimize fall risk.  Equipment: No equipment provided  Reasons for discharge: treatment goals met and discharge from hospital  Patient/family agrees with progress made and goals achieved: Yes  PT Discharge Precautions/Restrictions Precautions Precautions: Fall Precaution Comments: history of vertigo, R field cut Restrictions Weight Bearing Restrictions: No Vision/Perception  Perception Perception: Impaired Inattention/Neglect: Does not attend to right visual field Praxis Praxis: Impaired Praxis Impairment Details: Motor planning  Cognition Overall Cognitive Status: Impaired/Different from baseline Arousal/Alertness: Awake/alert Orientation Level: Oriented to place;Oriented to situation;Oriented to person Memory: Impaired Awareness: Impaired Problem Solving: Impaired Sequencing: Impaired Organizing: Impaired Safety/Judgment: Impaired Sensation Sensation Light Touch: Appears Intact Hot/Cold: Not tested Proprioception: Appears Intact Stereognosis: Appears Intact Coordination Gross Motor Movements are Fluid  and Coordinated: Yes Fine Motor Movements are Fluid and Coordinated: Yes Heel Shin Test: limited ROM; figure 8 test: patient unable to complete figure 8 in B LE related to poor motor planning/ visuospatial awareness Motor  Motor Motor: Abnormal postural alignment and control;Motor apraxia Motor - Discharge Observations: slight posterior lean with initial standing, decreased motor planning simple tasks  Mobility Bed Mobility Bed Mobility: Rolling Right;Rolling Left;Supine to Sit;Sit to Supine;Sitting - Scoot to Edge of Bed Rolling Right: Independent Rolling Left: Independent Supine to Sit: Supervision/Verbal cueing Sitting - Scoot to Edge of Bed: Supervision/Verbal cueing Sit to Supine: Independent Transfers Transfers: Sit to Stand;Stand to [employer] Transfers Sit to Stand: Contact Guard/Touching assist Stand to Sit: Contact Guard/Touching assist Stand Pivot Transfers: Contact Guard/Touching assist Stand Pivot Transfer Details: Verbal cues for safe use of DME/AE;Verbal cues for sequencing;Verbal cues for technique;Verbal cues for precautions/safety Stand Pivot Transfer Details (indicate cue type and reason): Patient requiring up to Max verbal cuing if sitting surface is on R side Transfer (Assistive device): Rolling walker Locomotion  Gait Ambulation: Yes Gait Assistance: Contact Guard/Touching assist Gait Distance (Feet): 150 Feet Assistive device: Rolling walker Gait Assistance Details: Verbal cues for gait pattern;Verbal cues for safe use of DME/AE;Verbal cues for precautions/safety Gait Assistance Details: verbal cues to avoid obstacles on R side Gait Gait: Yes Gait Pattern: Impaired Gait Pattern: Decreased trunk rotation;Wide base of support Gait velocity: decreased Stairs / Additional Locomotion Stairs: Yes Stairs Assistance: Contact Guard/Touching assist Stair Management Technique: Two rails Number of Stairs: 1 Height of Stairs: 6 Ramp: Contact Guard/touching  assist Curb: Contact Guard/Touching assist Wheelchair Mobility Wheelchair Mobility: No  Trunk/Postural Assessment  Cervical Assessment Cervical Assessment: Exceptions to [facility] (forward head) Thoracic Assessment Thoracic Assessment: Exceptions to [facility] (increased kyphosis) Lumbar Assessment Lumbar Assessment: Exceptions to [facility] (posterior pelvic tilt) Postural Control Postural Control: Deficits on evaluation Righting Reactions: delayed and inadequate Protective Responses: delayed and inadequate  Balance Balance Balance Assessed: Yes Static Sitting Balance Static Sitting - Balance Support: Feet supported Static Sitting - Level  of Assistance: 6: Modified independent (Device/Increase time) Dynamic Sitting Balance Dynamic Sitting - Balance Support: During functional activity Dynamic Sitting - Level of Assistance: 5: Stand by assistance Static Standing Balance Static Standing - Balance Support: During functional activity Static Standing - Level of Assistance: 5: Stand by assistance Dynamic Standing Balance Dynamic Standing - Balance Support: During functional activity Dynamic Standing - Level of Assistance: 5: Stand by assistance Extremity Assessment      RLE Assessment RLE Assessment: Exceptions to [facility] RLE Strength Right Hip Flexion: 4-/5 Right Hip ABduction: 4/5 Right Hip ADduction: 4/5 Right Knee Flexion: 4/5 Right Knee Extension: 4/5 Right Ankle Dorsiflexion: 4/5 Right Ankle Plantar Flexion: 4/5 LLE Assessment LLE Assessment: Exceptions to [facility] LLE Strength Left Hip Flexion: 4-/5 Left Hip ABduction: 4/5 Left Hip ADduction: 4/5 Left Knee Flexion: 4/5 Left Knee Extension: 4/5 Left Ankle Dorsiflexion: 4/5 Left Ankle Plantar Flexion: 4/5  Patient made slow progress toward her goals due to poor safety awareness, poor insight into her deficits, R visual field cut, poor memory, poor attention and poor motor planning. With verbal cuing, patient able to turn head R to compensate  for visual field cut to avoid obstacles and to complete scan of visual field- otherwise, patient has no learned carryover to remember to turn her head R. Patient is grossly supervision/CGA for functional mobility at time of dc.   Debbora Dus 09/10/2020, 7:42 AM

## 2020-09-11 ENCOUNTER — Inpatient Hospital Stay (HOSPITAL_COMMUNITY): Payer: Medicare Other | Admitting: Physical Therapy

## 2020-09-11 ENCOUNTER — Inpatient Hospital Stay (HOSPITAL_COMMUNITY): Payer: Medicare Other | Admitting: Speech Pathology

## 2020-09-11 ENCOUNTER — Inpatient Hospital Stay (HOSPITAL_COMMUNITY): Payer: Medicare Other | Admitting: Occupational Therapy

## 2020-09-11 ENCOUNTER — Inpatient Hospital Stay (HOSPITAL_COMMUNITY): Payer: Medicare Other

## 2020-09-11 LAB — GLUCOSE, CAPILLARY: Glucose-Capillary: 106 mg/dL — ABNORMAL HIGH (ref 70–99)

## 2020-09-11 NOTE — Plan of Care (Signed)
  Problem: Consults Goal: RH GENERAL PATIENT EDUCATION Description: See Patient Education module for education specifics. Outcome: Progressing Goal: Skin Care Protocol Initiated - if Braden Score 18 or less Description: If consults are not indicated, leave blank or document N/A Outcome: Progressing Goal: Nutrition Consult-if indicated Outcome: Progressing Goal: Diabetes Guidelines if Diabetic/Glucose > 140 Description: If diabetic or lab glucose is > 140 mg/dl - Initiate Diabetes/Hyperglycemia Guidelines & Document Interventions  Outcome: Progressing   Problem: RH BOWEL ELIMINATION Goal: RH STG MANAGE BOWEL WITH ASSISTANCE Description: STG Manage Bowel with mod Assistance. Outcome: Progressing Goal: RH STG MANAGE BOWEL W/MEDICATION W/ASSISTANCE Description: STG Manage Bowel with Medication with mod I Assistance. Outcome: Progressing   Problem: RH BLADDER ELIMINATION Goal: RH STG MANAGE BLADDER WITH ASSISTANCE Description: STG Manage Bladder With  mod Assistance Outcome: Progressing   Problem: RH SKIN INTEGRITY Goal: RH STG SKIN FREE OF INFECTION/BREAKDOWN Description: With min assist Outcome: Progressing Goal: RH STG MAINTAIN SKIN INTEGRITY WITH ASSISTANCE Description: STG Maintain Skin Integrity With min Assistance. Outcome: Progressing   Problem: RH SAFETY Goal: RH STG ADHERE TO SAFETY PRECAUTIONS W/ASSISTANCE/DEVICE Description: STG Adhere to Safety Precautions With mod Assistance/Device. Outcome: Progressing Goal: RH STG DECREASED RISK OF FALL WITH ASSISTANCE Description: STG Decreased Risk of Fall With cues/reminders Assistance. Outcome: Progressing   Problem: RH PAIN MANAGEMENT Goal: RH STG PAIN MANAGED AT OR BELOW PT'S PAIN GOAL Description: At or below level 4 Outcome: Progressing   Problem: RH KNOWLEDGE DEFICIT GENERAL Goal: RH STG INCREASE KNOWLEDGE OF SELF CARE AFTER HOSPITALIZATION Description: Patient will be able to direct care at discharge using  handouts and educational materials with cues/reminders/supervision Outcome: Progressing   Problem: Consults Goal: RH STROKE PATIENT EDUCATION Description: See Patient Education module for education specifics  Outcome: Progressing Goal: Nutrition Consult-if indicated Outcome: Progressing   Problem: Consults Goal: RH STROKE PATIENT EDUCATION Description: See Patient Education module for education specifics  Outcome: Progressing

## 2020-09-11 NOTE — Progress Notes (Signed)
Physical Therapy Session Note  Patient Details  Name: Tara [name] MRN: [id number] Date of Birth: July 11, 1936  Today's Date: 09/11/2020 PT Individual Time: 1500-1530 PT Individual Time Calculation (min): 30 min   Short Term Goals: Week 2:  PT Short Term Goal 1 (Week 2): STG=LTG based on ELOS  Skilled Therapeutic Interventions/Progress Updates:    Patient received transferring to toilet with NT present. She denies pain. She states that she received "a lot of Miralax today." She was able to have a bowel movement. Supervision with verbal cues for safety for peri hygiene. LOB backwards with standing with poor righting reactions noted. MinA from PT required to regain balance. Patient making multiple attempts to pull up pants before pulling up brief. Verbal cues for proper sequencing of clothing management. She was able to ambulate to wc to complete hand hygiene with CGA and verbal cuing for safety. She transferred back to bed via stand pivot with CGA. Bed alarm on, call light within reach.   Therapy Documentation Precautions:  Precautions Precautions: Fall Precaution Comments: history of vertigo, R field cut Restrictions Weight Bearing Restrictions: No    Therapy/Group: Individual Therapy  Karoline Caldwell, PT, DPT, CBIS 09/11/2020, 7:47 AM

## 2020-09-11 NOTE — Progress Notes (Signed)
Patient ID: Tara [name], female   DOB: 12/30/1935, 84 y.o.   MRN: [id number]  Family will now like to [place], pending insurance appeal decision.   [place], [place]

## 2020-09-11 NOTE — Progress Notes (Signed)
Occupational Therapy Weekly Progress Note  Patient Details  Name: Tara [name] MRN: [id number] Date of Birth: [birth year]  Beginning of progress report period: September 04, 2020 End of progress report period: September 11, 2020  Today's Date: 09/11/2020 OT Individual Time: 1006-1100 OT Individual Time Calculation (min): 54 min    Pt is making steady progress with OT however, she still needs consistent min assist for functional transfers to the walk-in shower as well as the toilet.  She needs min assist for bathing with min to mod assist overall for dressing tasks.  She continues to demonstrate motor apraxia with dressing tasks exhibiting disorientation of clothing, decreased ability to place her LEs in the clothing, as well as trying to place her UEs in the wrong parts of her bra or shirt.  She continues to need mod to max instructional cueing to avoiding obstacles on the right side and demonstrates moderate difficulty with just approaching and sitting down in a chair when presented at midline or even on the right and left sides.  She will continually walk past the chair without warning or will forget what she is trying to do.  Because of these deficits, feel she is not safe to be alone and cannot return to the independent living facility she was at.  ALF has decided they cannot provide the 24 hr assist that she needs as well and SNF is the best option for her.  Will recommend continued CIR level OT until bed is available for transfer to facility for continued rehab.    Patient continues to demonstrate the following deficits: muscle weakness, decreased coordination, decreased visual perceptual skills and field cut, decreased attention to right and right side neglect, decreased attention, decreased awareness, decreased problem solving, decreased safety awareness and decreased memory and decreased standing balance and decreased balance strategies and therefore will continue to benefit from skilled OT  intervention to enhance overall performance with BADL and Reduce care partner burden.  Patient progressing toward long term goals..  Continue plan of care.  OT Short Term Goals Week 3:  OT Short Term Goal 1 (Week 3): Continue working on established LTGs set at min guard overall.  Skilled Therapeutic Interventions/Progress Updates:    Pt in bed visibly down secondary to situation and SNF being denied currently by insurance.  Provided comfort and got pt to agree to work on grooming tasks at the sink.  She was able to transfer to the wheelchair with min assist using the RW for support, but needed physical guidance to reach the chair as she walked past it on the right side and then started walking away from it when she tried to correct this.  Once in the chair she was able to complete brushing her hair, her teeth, and applying lotion to her hands and face.  She then worked with the [place] to complete transfers to and from a standard chair and wheelchair in her room.  She was able to ambulate to a chair at midline but then instead of turning and sitting in the chair as she was instructed to start, she started turning around and walking back to where she started, not remembering the instructions to sit down.  Therapist then told her to keep walking back to where she started and sit down in the wheelchair in front of her.  Upon reaching the chair with the walker in front of her and the chair in front of the walker she stopped and stood still.  Therapist asked her what  she needed to do next, and instead of turning around to sit down she turned and walked toward the bed.  Therapist had to help guide her to step back to the wheelchair.  Had her complete transfer back to the regular chair one more time to which she was able to complete and turn and sit as originally instructed with min assist.  Finished session with return to the bed at min assist with the call button and phone in reach.    Therapy  Documentation Precautions:  Precautions Precautions: Fall Precaution Comments: history of vertigo, R field cut Restrictions Weight Bearing Restrictions: No  Pain: Pain Assessment Pain Scale: Faces Pain Score: 0-No pain ADL: See Care Tool Section for some details of mobility and selfcare  Therapy/Group: Individual Therapy  Quandarius Nill OTR/L 09/11/2020, 12:42 PM

## 2020-09-11 NOTE — Progress Notes (Signed)
Speech Language Pathology Daily Session Note  Patient Details  Name: ATZIRI ZUBIATE MRN: [id number] Date of Birth: [birth year]  Today's Date: 09/11/2020 SLP Individual Time: 1300-1400 SLP Individual Time Calculation (min): 60 min  Short Term Goals: Week 2: SLP Short Term Goal 1 (Week 2): STG=LTG due to remaining length of stay  Skilled Therapeutic Interventions: Skilled treatment session focused on cognitive-linguistic goals. Upon arrival, patient was eating her lunch. SLP facilitated session by providing Mod A verbal cues for patient to alternate attention between self-feeding and an informal conversation. Patient requested to use the bathroom and was continent of bowel and bladder. Mod A verbal cues were needed for problem solving and safety with task. Patient also participated in a 4 step picture sequencing task and required Mod A verbal cues for problem solving and visual scanning but only required overall supervision verbal cues for word-finding. Patient left upright in bed with alarm on and all needs within reach. Continue with current plan of care.      Pain No/Denies Pain   Therapy/Group: Individual Therapy  Natoshia Souter 09/11/2020, 2:39 PM

## 2020-09-11 NOTE — Progress Notes (Signed)
Physical Therapy Session Note  Patient Details  Name: Tara [name] MRN: [id number] Date of Birth: 09-29-1936  Today's Date: 09/11/2020 PT Individual Time: 0906-0934 PT Individual Time Calculation (min): 28 min   Short Term Goals: Week 2:  PT Short Term Goal 1 (Week 2): STG=LTG based on ELOS  Skilled Therapeutic Interventions/Progress Updates:    Pt received supine in bed appearing upset stating she just "received news about her insurance." Despite this, pt reports need to use bathroom and is agreeable to limited therapy session for assist to/from bathroom and back to bed. Supine>sitting L EOB, HOB partially elevated and using bedrail, with supervision and increased time. Pt reports dizziness upon sitting up requiring rest break prior to initiating OOB mobility. Sit>stand EOB>RW with min assist for lifting and balance due to posterior lean. Gait training ~33ft x2 to/from bathroom using RW with CGA for steadying and cuing for AD management - demos minor postural instability. Standing with R UE support on grab bar, pt attempted to perform LB clothing management but requires min assist for balance as demos increased postural sway and becomes fearful causing possible anxiety therefore therapist managed clothes mod assist. Continent of bladder and bowels - pt attempt peri-care in standing with UE support on RW but again pt starts having minor postural sway requiring min assist for balance and therapist provided total assist peri-care for cleanliness and pt safety. Pt noted to have poor awareness/R inattention placing her R hand on grab bar instead of RW then starting to walk forward holding onto grab bar and tipping walker with L hand - max cuing to correct and min assist for balance. Attempted standing hand hygiene at sink but pt reports feeling "weak" and therefore had to return to sitting EOB. Seated hand hygiene. Sit>supine with supervision. Pt left supine in bed with needs in reach and bed alarm on.    Therapy Documentation Precautions:  Precautions Precautions: Fall Precaution Comments: history of vertigo, R field cut Restrictions Weight Bearing Restrictions: No  Pain:   No reports of pain throughout session.   Therapy/Group: Individual Therapy  Tawana Scale , PT, DPT, CSRS  09/11/2020, 7:54 AM

## 2020-09-11 NOTE — Progress Notes (Signed)
[facility] PHYSICAL MEDICINE & REHABILITATION PROGRESS NOTE   Subjective/Complaints:  Reports cont of bowel and bladder some urgency but no burning  Denied SNF per insurance    ROS: Patient denies  CP, SOB, N/V/D Objective:   No results found. No results for input(s): WBC, HGB, HCT, PLT in the last 72 hours. No results for input(s): NA, K, CL, CO2, GLUCOSE, BUN, CREATININE, CALCIUM in the last 72 hours.  Intake/Output Summary (Last 24 hours) at 09/11/2020 0820 Last data filed at 09/10/2020 1700 Gross per 24 hour  Intake 390 ml  Output --  Net 390 ml        Physical Exam: Vital Signs Blood pressure (!) 145/67, pulse (!) 55, temperature (!) 97.5 F (36.4 C), temperature source Oral, resp. rate 18, height 5\' 5"  (1.651 m), weight 70.3 kg, SpO2 99 %.    General: No acute distress Mood and affect are appropriate Heart: Regular rate and rhythm no rubs murmurs or extra sounds Lungs: Clear to auscultation, breathing unlabored, no rales or wheezes Abdomen: Positive bowel sounds, soft nontender to palpation, nondistended Extremities: No clubbing, cyanosis, or edema Skin: No evidence of breakdown, no evidence of rash Neurologic: Cranial nerves II through XII intact, motor strength is 4/5 in bilateral deltoid, bicep, tricep, grip, hip flexor, knee extensors, ankle dorsiflexor and plantar flexor    Neurologic: Cranial nerves II through XII intact, with intact visual fields motor strength is 4 to 4+5 in bilateral deltoid, bicep, tricep, grip, hip flexor, knee extensors, ankle dorsiflexor and plantar flexor normal sensory and coordination Musculoskeletal: Full range of motion in all 4 extremities. No joint swelling     Assessment/Plan: 1. Functional deficits secondary to Left P-O ICH  which require 3+ hours per day of interdisciplinary therapy in a comprehensive inpatient rehab setting.  Physiatrist is providing close team supervision and 24 hour management of active medical  problems listed below.  Physiatrist and rehab team continue to assess barriers to discharge/monitor patient progress toward functional and medical goals  Care Tool:  Bathing    Body parts bathed by patient: Right arm, Left arm, Chest, Abdomen, Front perineal area, Buttocks, Right upper leg, Left upper leg, Face, Right lower leg, Left lower leg   Body parts bathed by helper: Left lower leg, Right lower leg Body parts n/a: Right lower leg, Left lower leg   Bathing assist Assist Level: Minimal Assistance - Patient > 75%     Upper Body Dressing/Undressing Upper body dressing   What is the patient wearing?: Pull over shirt, Bra    Upper body assist Assist Level: Minimal Assistance - Patient > 75%    Lower Body Dressing/Undressing Lower body dressing      What is the patient wearing?: Pants, Incontinence brief     Lower body assist Assist for lower body dressing: Minimal Assistance - Patient > 75%     Toileting Toileting    Toileting assist Assist for toileting: Minimal Assistance - Patient > 75%     Transfers Chair/bed transfer  Transfers assist     Chair/bed transfer assist level: Minimal Assistance - Patient > 75%     Locomotion Ambulation   Ambulation assist      Assist level: Contact Guard/Touching assist Assistive device: Walker-rolling Max distance: 50   Walk 10 feet activity   Assist     Assist level: Contact Guard/Touching assist Assistive device: Walker-rolling   Walk 50 feet activity   Assist Walk 50 feet with 2 turns activity did not occur: Safety/medical  concerns (patient fatigue)  Assist level: Contact Guard/Touching assist Assistive device: Walker-rolling    Walk 150 feet activity   Assist Walk 150 feet activity did not occur: Safety/medical concerns (patient fatigue)  Assist level: Minimal Assistance - Patient > 75% Assistive device: Walker-rolling    Walk 10 feet on uneven surface  activity   Assist Walk 10 feet on  uneven surfaces activity did not occur: Safety/medical concerns (patient fatigue)         Wheelchair     Assist Will patient use wheelchair at discharge?: No Type of Wheelchair: Manual    Wheelchair assist level: Minimal Assistance - Patient > 75% Max wheelchair distance: 150    Wheelchair 50 feet with 2 turns activity    Assist        Assist Level: Minimal Assistance - Patient > 75%   Wheelchair 150 feet activity     Assist      Assist Level: Minimal Assistance - Patient > 75%   Blood pressure (!) 145/67, pulse (!) 55, temperature (!) 97.5 F (36.4 C), temperature source Oral, resp. rate 18, height 5\' 5"  (1.651 m), weight 70.3 kg, SpO2 99 %.     Medical Problem List and Plan: 1.  Decreased functional mobility with aphasia secondary to left parieto-occipital acute ICH and B/L cerebral infarcts- prior leg braces worn by pt- ?AFO's             -patient may  shower             -ELOS/Goals: still requiring 24/7 sup/minA- needing SNF instead of ALF  -Continue CIR PT, OT, SLP 2.  Antithrombotics: -DVT/anticoagulation: SCDs-  Due to [place]             -antiplatelet therapy: N/A 3. Pain Management: Tylenol as needed  10/10- tylenol prn. Well controlled 4. Mood: Valium 5 mg twice daily as needed anxiety  10/10- switched with Ativan which is better for vertigo-              -antipsychotic agents: N/A 5. Neuropsych: This patient is capable of making decisions on her own behalf. 6. Skin/Wound Care: Routine skin checks 7. Fluids/Electrolytes/Nutrition: Routine in and outs with follow-up chemistries 8.  Hypertension.  Cozaar 50 mg daily, Norvasc 10 mg daily.  Monitor with increased mobility Vitals:   09/10/20 1910 09/11/20 0332  BP: 128/72 (!) 145/67  Pulse: 64 (!) 55  Resp: 18 18  Temp: (!) 97.4 F (36.3 C) (!) 97.5 F (36.4 C)  SpO2: 96% 99%  10/21 improved on cozaar 100mg   9.  Hypothyroidism.  TSH 1.722.  Continue Synthroid 10. Vertigo-improved with  scopolamine patch  11.  UTI  pseudomonas, sens to levaquin ( has cephalosporin allergy) , cont  250mg  qd, completed , 5 d tx   -sx resolved- urgency likely due to spastic bladder post CVA  LOS: 15 days A FACE TO FACE EVALUATION WAS PERFORMED  Charlett Blake 09/11/2020, 8:20 AM

## 2020-09-11 NOTE — Progress Notes (Signed)
Patient ID: Tara [name], female   DOB: October 26, 1936, 84 y.o.   MRN: [id number]   Sw received call patient authorization for SNF has been denied. SW provided expedited appeal information [phone].  Sw will begin discussion of alternative discharge plans.   [place], [place]

## 2020-09-12 DIAGNOSIS — F411 Generalized anxiety disorder: Secondary | ICD-10-CM

## 2020-09-12 DIAGNOSIS — D72829 Elevated white blood cell count, unspecified: Secondary | ICD-10-CM

## 2020-09-12 DIAGNOSIS — R42 Dizziness and giddiness: Secondary | ICD-10-CM

## 2020-09-12 DIAGNOSIS — I10 Essential (primary) hypertension: Secondary | ICD-10-CM

## 2020-09-12 NOTE — Progress Notes (Signed)
[facility] PHYSICAL MEDICINE & REHABILITATION PROGRESS NOTE   Subjective/Complaints: Patient seen sitting up in bed this morning.  She states she slept well overnight.  She denies complaints.  ROS: Denies CP, SOB, N/V/D  Objective:   No results found. No results for input(s): WBC, HGB, HCT, PLT in the last 72 hours. No results for input(s): NA, K, CL, CO2, GLUCOSE, BUN, CREATININE, CALCIUM in the last 72 hours.  Intake/Output Summary (Last 24 hours) at 09/12/2020 1303 Last data filed at 09/12/2020 0900 Gross per 24 hour  Intake 800 ml  Output --  Net 800 ml        Physical Exam: Vital Signs Blood pressure 122/71, pulse (!) 52, temperature 97.7 F (36.5 C), temperature source Oral, resp. rate 18, height 5\' 5"  (1.651 m), weight 70.5 kg, SpO2 97 %.  Constitutional: No distress . Vital signs reviewed. HENT: Normocephalic.  Atraumatic. Eyes: EOMI. No discharge. Cardiovascular: No JVD.  RRR. Respiratory: Normal effort.  No stridor.  Bilateral clear to auscultation. GI: Non-distended.  BS +. Skin: Warm and dry.  Intact. Psych: Normal mood.  Normal behavior. Musc: No edema in extremities.  No tenderness in extremities. Neurologic: Alert Motor: 4-4+/5 throughout    Assessment/Plan: 1. Functional deficits secondary to Left P-O ICH  which require 3+ hours per day of interdisciplinary therapy in a comprehensive inpatient rehab setting.  Physiatrist is providing close team supervision and 24 hour management of active medical problems listed below.  Physiatrist and rehab team continue to assess barriers to discharge/monitor patient progress toward functional and medical goals  Care Tool:  Bathing    Body parts bathed by patient: Right arm, Left arm, Chest, Abdomen, Front perineal area, Buttocks, Right upper leg, Left upper leg, Face, Right lower leg, Left lower leg   Body parts bathed by helper: Left lower leg, Right lower leg Body parts n/a: Right lower leg, Left lower leg    Bathing assist Assist Level: Minimal Assistance - Patient > 75%     Upper Body Dressing/Undressing Upper body dressing   What is the patient wearing?: Bra    Upper body assist Assist Level: Minimal Assistance - Patient > 75%    Lower Body Dressing/Undressing Lower body dressing      What is the patient wearing?: Pants, Incontinence brief     Lower body assist Assist for lower body dressing: Minimal Assistance - Patient > 75%     Toileting Toileting    Toileting assist Assist for toileting: Minimal Assistance - Patient > 75%     Transfers Chair/bed transfer  Transfers assist     Chair/bed transfer assist level: Minimal Assistance - Patient > 75% Chair/bed transfer assistive device: Museum/gallery exhibitions officer assist      Assist level: Contact Guard/Touching assist Assistive device: Walker-rolling Max distance: 57ft   Walk 10 feet activity   Assist     Assist level: Contact Guard/Touching assist Assistive device: Walker-rolling   Walk 50 feet activity   Assist Walk 50 feet with 2 turns activity did not occur: Safety/medical concerns (patient fatigue)  Assist level: Contact Guard/Touching assist Assistive device: Walker-rolling    Walk 150 feet activity   Assist Walk 150 feet activity did not occur: Safety/medical concerns (patient fatigue)  Assist level: Minimal Assistance - Patient > 75% Assistive device: Walker-rolling    Walk 10 feet on uneven surface  activity   Assist Walk 10 feet on uneven surfaces activity did not occur: Safety/medical concerns (patient fatigue)  Wheelchair     Assist Will patient use wheelchair at discharge?: No Type of Wheelchair: Manual    Wheelchair assist level: Minimal Assistance - Patient > 75% Max wheelchair distance: 150    Wheelchair 50 feet with 2 turns activity    Assist        Assist Level: Minimal Assistance - Patient > 75%   Wheelchair 150 feet  activity     Assist      Assist Level: Minimal Assistance - Patient > 75%   Blood pressure 122/71, pulse (!) 52, temperature 97.7 F (36.5 C), temperature source Oral, resp. rate 18, height 5\' 5"  (1.651 m), weight 70.5 kg, SpO2 97 %.     Medical Problem List and Plan: 1.  Decreased functional mobility with aphasia secondary to left parieto-occipital acute ICH and B/L cerebral infarcts- prior leg braces worn by pt- ?AFO's  Continue CIR 2.  Antithrombotics: -DVT/anticoagulation: SCDs-  Due to [place]             -antiplatelet therapy: N/A 3. Pain Management: Tylenol as needed  Controlled on 10/23 4. Mood:   Valium 5 mg twice daily as needed anxiety switched with Ativan which is better for vertigo-    Controlled on 10/23             -antipsychotic agents: N/A 5. Neuropsych: This patient is capable of making decisions on her own behalf. 6. Skin/Wound Care: Routine skin checks 7. Fluids/Electrolytes/Nutrition: Routine in and outs  Labs ordered for Monday 8.  Hypertension.  Cozaar 50 mg daily, increased to 100  Norvasc 10 mg daily.     Monitor with increased mobility Vitals:   09/11/20 1938 09/12/20 0508  BP: 122/67 122/71  Pulse: (!) 57 (!) 52  Resp: 16 18  Temp: 97.9 F (36.6 C) 97.7 F (36.5 C)  SpO2: 97% 97%   Controlled on 10/23 9.  Hypothyroidism.  TSH 1.722.  Continue Synthroid 10. Vertigo-improved with scopolamine patch  See #4 11.  UTI  pseudomonas, sens to levaquin ( has cephalosporin allergy) , cont  250mg  qd, completed , 5 d tx   -sx resolved- urgency likely due to spastic bladder post CVA 12.  Leukocytosis-likely secondary to UTI  WBC 11.2 on 10/8, labs ordered for Monday   LOS: 16 days A FACE TO FACE EVALUATION WAS PERFORMED  Tara [name] Lorie Phenix 09/12/2020, 1:03 PM

## 2020-09-13 DIAGNOSIS — R0989 Other specified symptoms and signs involving the circulatory and respiratory systems: Secondary | ICD-10-CM

## 2020-09-13 NOTE — Progress Notes (Signed)
Speech Language Pathology Weekly Progress and Session Note  Patient Details  Name: Tara [name] MRN: [id number] Date of Birth: 1936-03-31  Beginning of progress report period: September 04, 2020 End of progress report period: September 13, 2020    Short Term Goals: Week 2: SLP Short Term Goal 1 (Week 2): STG=LTG due to remaining length of stay SLP Short Term Goal 1 - Progress (Week 2): Progressing toward goal    New Short Term Goals: Week 3: SLP Short Term Goal 1 (Week 3): LTG=STG due to remaining length of stay; pt's length of stay unexpectedly extended, however SNF d/c anticipated this week  Weekly Progress Updates: Pt is continue to make slow progress toward all short and long term goals. Her admission was unexpectedly extended due to a change in discharge plan. She is currently awaiting approval at a SNF facility and it is now anticipated that she will transfer to SNF this week. Pt is currently overall Mod assist for basic familiar due to cognitive impairments impacting her awareness, short term and working memory, problem solving, and attention, which all present a safety concern. She also has mild word finding deficits with require some semantic cues and extra time for use of strategies in conversation at times. Pt is consuming regular texture diet with thin liquids and is Mod I for use of safe swallow strategies and intermittent supervision requirement during meals. Pt education is ongoing and family would benefit from continued education regarding pt's cognitive-linguistic function while inpatient and/or at next venue of care. Pt would continue to benefit from skilled ST while inpatient in order to maximize functional independence and reduce burden of care prior to discharge. Anticipate that pt will need 24/7 supervision at discharge in addition to [place] follow up at next level of care - SNF is recommended and planned for greatest safety.     Intensity: Minumum of 1-2 x/day, 30 to 90  minutes Frequency: 3 to 5 out of 7 days Duration/Length of Stay: Pending SNF placement Treatment/Interventions: Cognitive remediation/compensation;Cueing hierarchy;Functional tasks;Internal/external aids;Multimodal communication approach;Environmental controls;Dysphagia/aspiration precaution training;Speech/Language facilitation;Patient/family education    Arbutus Leas 09/13/2020, 12:37 PM

## 2020-09-13 NOTE — Progress Notes (Signed)
[facility] PHYSICAL MEDICINE & REHABILITATION PROGRESS NOTE   Subjective/Complaints: Patient seen sitting up in bed this AM.  She states she slept well overnight, she is eating breakfast.   ROS: Denies CP, SOB, N/V/D  Objective:   No results found. No results for input(s): WBC, HGB, HCT, PLT in the last 72 hours. No results for input(s): NA, K, CL, CO2, GLUCOSE, BUN, CREATININE, CALCIUM in the last 72 hours.  Intake/Output Summary (Last 24 hours) at 09/13/2020 1301 Last data filed at 09/13/2020 0756 Gross per 24 hour  Intake 240 ml  Output --  Net 240 ml        Physical Exam: Vital Signs Blood pressure (!) 155/67, pulse (!) 55, temperature (!) 97.5 F (36.4 C), temperature source Oral, resp. rate 16, height 5\' 5"  (1.651 m), weight 67.8 kg, SpO2 99 %.  Constitutional: No distress . Vital signs reviewed. HENT: Normocephalic.  Atraumatic. Eyes: EOMI. No discharge. Cardiovascular: No JVD.  RRR. Respiratory: Normal effort.  No stridor.  Bilateral clear to auscultation. GI: Non-distended.  BS +. Skin: Warm and dry.  Intact. Psych: Normal mood.  Normal behavior. Musc: No edema in extremities.  No tenderness in extremities. Neurologic: Alert Motor: 4-4+/5 throughout, unchanged  Assessment/Plan: 1. Functional deficits secondary to Left P-O ICH  which require 3+ hours per day of interdisciplinary therapy in a comprehensive inpatient rehab setting.  Physiatrist is providing close team supervision and 24 hour management of active medical problems listed below.  Physiatrist and rehab team continue to assess barriers to discharge/monitor patient progress toward functional and medical goals  Care Tool:  Bathing    Body parts bathed by patient: Right arm, Left arm, Chest, Abdomen, Front perineal area, Buttocks, Right upper leg, Left upper leg, Face, Right lower leg, Left lower leg   Body parts bathed by helper: Left lower leg, Right lower leg Body parts n/a: Right lower leg,  Left lower leg   Bathing assist Assist Level: Minimal Assistance - Patient > 75%     Upper Body Dressing/Undressing Upper body dressing   What is the patient wearing?: Bra    Upper body assist Assist Level: Minimal Assistance - Patient > 75%    Lower Body Dressing/Undressing Lower body dressing      What is the patient wearing?: Pants, Incontinence brief     Lower body assist Assist for lower body dressing: Minimal Assistance - Patient > 75%     Toileting Toileting    Toileting assist Assist for toileting: Minimal Assistance - Patient > 75%     Transfers Chair/bed transfer  Transfers assist     Chair/bed transfer assist level: Minimal Assistance - Patient > 75% Chair/bed transfer assistive device: Museum/gallery exhibitions officer assist      Assist level: Contact Guard/Touching assist Assistive device: Walker-rolling Max distance: 68ft   Walk 10 feet activity   Assist     Assist level: Contact Guard/Touching assist Assistive device: Walker-rolling   Walk 50 feet activity   Assist Walk 50 feet with 2 turns activity did not occur: Safety/medical concerns (patient fatigue)  Assist level: Contact Guard/Touching assist Assistive device: Walker-rolling    Walk 150 feet activity   Assist Walk 150 feet activity did not occur: Safety/medical concerns (patient fatigue)  Assist level: Minimal Assistance - Patient > 75% Assistive device: Walker-rolling    Walk 10 feet on uneven surface  activity   Assist Walk 10 feet on uneven surfaces activity did not occur: Safety/medical concerns (patient  fatigue)         Wheelchair     Assist Will patient use wheelchair at discharge?: No Type of Wheelchair: Manual    Wheelchair assist level: Minimal Assistance - Patient > 75% Max wheelchair distance: 150    Wheelchair 50 feet with 2 turns activity    Assist        Assist Level: Minimal Assistance - Patient > 75%    Wheelchair 150 feet activity     Assist      Assist Level: Minimal Assistance - Patient > 75%   Medical Problem List and Plan: 1.  Decreased functional mobility with aphasia secondary to left parieto-occipital acute ICH and B/L cerebral infarcts- prior leg braces worn by pt- ?AFO's  Continue CIR 2.  Antithrombotics: -DVT/anticoagulation: SCDs-  Due to [place]             -antiplatelet therapy: N/A 3. Pain Management: Tylenol as needed  Controlled on 10/23 4. Mood:   Valium 5 mg twice daily as needed anxiety switched with Ativan which is better for vertigo-    Controlled on 10/24             -antipsychotic agents: N/A 5. Neuropsych: This patient is capable of making decisions on her own behalf. 6. Skin/Wound Care: Routine skin checks 7. Fluids/Electrolytes/Nutrition: Routine in and outs  Labs ordered for tomorrow 8.  Hypertension.  Cozaar 50 mg daily, increased to 100  Norvasc 10 mg daily.     Monitor with increased mobility Vitals:   09/12/20 1918 09/13/20 0418  BP: (!) 110/56 (!) 155/67  Pulse: 60 (!) 55  Resp: 17 16  Temp: 97.9 F (36.6 C) (!) 97.5 F (36.4 C)  SpO2: 98% 99%   Labile and ?trending up on 10/24, monitor for trend 9.  Hypothyroidism.  TSH 1.722.  Continue Synthroid 10. Vertigo-improved with scopolamine patch  See #4 11.  UTI  pseudomonas, sens to levaquin ( has cephalosporin allergy) , cont  250mg  qd, completed , 5 d tx   -sx resolved- urgency likely due to spastic bladder post CVA  ?Improving 12.  Leukocytosis-likely secondary to UTI  WBC 11.2 on 10/8, labs ordered for tomorrow   LOS: 17 days A FACE TO [place] 09/13/2020, 1:01 PM

## 2020-09-14 ENCOUNTER — Inpatient Hospital Stay (HOSPITAL_COMMUNITY): Payer: Medicare Other | Admitting: Speech Pathology

## 2020-09-14 ENCOUNTER — Inpatient Hospital Stay (HOSPITAL_COMMUNITY): Payer: Medicare Other

## 2020-09-14 LAB — CBC WITH DIFFERENTIAL/PLATELET
Abs Immature Granulocytes: 0.01 10*3/uL (ref 0.00–0.07)
Basophils Absolute: 0.1 10*3/uL (ref 0.0–0.1)
Basophils Relative: 1 %
Eosinophils Absolute: 0.3 10*3/uL (ref 0.0–0.5)
Eosinophils Relative: 4 %
HCT: 38 % (ref 36.0–46.0)
Hemoglobin: 12.7 g/dL (ref 12.0–15.0)
Immature Granulocytes: 0 %
Lymphocytes Relative: 33 %
Lymphs Abs: 2.2 10*3/uL (ref 0.7–4.0)
MCH: 33.2 pg (ref 26.0–34.0)
MCHC: 33.4 g/dL (ref 30.0–36.0)
MCV: 99.2 fL (ref 80.0–100.0)
Monocytes Absolute: 0.5 10*3/uL (ref 0.1–1.0)
Monocytes Relative: 8 %
Neutro Abs: 3.6 10*3/uL (ref 1.7–7.7)
Neutrophils Relative %: 54 %
Platelets: 238 10*3/uL (ref 150–400)
RBC: 3.83 MIL/uL — ABNORMAL LOW (ref 3.87–5.11)
RDW: 12.9 % (ref 11.5–15.5)
WBC: 6.7 10*3/uL (ref 4.0–10.5)
nRBC: 0 % (ref 0.0–0.2)

## 2020-09-14 LAB — SARS CORONAVIRUS 2 BY RT PCR (HOSPITAL ORDER, PERFORMED IN ~~LOC~~ HOSPITAL LAB): SARS Coronavirus 2: NEGATIVE

## 2020-09-14 NOTE — Progress Notes (Signed)
Patient ID: Tara [name], female   DOB: 1936-09-05, 84 y.o.   MRN: [id number]   Family appeal approved for [facility]. Family would like to now pursue [facility], facility beginning [place].   [place], [place]

## 2020-09-14 NOTE — Progress Notes (Signed)
Patient ID: Tara [name], female   DOB: 06/09/1936, 84 y.o.   MRN: [id number]   Family will transport patient [facility] tomorrow at 12 PM.

## 2020-09-14 NOTE — Progress Notes (Signed)
Occupational Therapy Session Note  Patient Details  Name: Tara [name] MRN: [id number] Date of Birth: Feb 13, 1936  Today's Date: 09/14/2020 OT Individual Time: 1430-1515 OT Individual Time Calculation (min): 45 min    Short Term Goals: Week 3:  OT Short Term Goal 1 (Week 3): Continue working on established LTGs set at min guard overall.  Skilled Therapeutic Interventions/Progress Updates:    Pt received supine with no c/o pain. Pt politely declining ADLs. Discussed d/c to SNF and coping. Pt excited for d/c! Pt completed bed mobility to Eob with supervision using bed features. She used RW to sit > stand with CGA. Ambulatory transfer into the bathroom with CGA, except one slight LOB d/t R foot catching and requiring min A to recover. Slight R inattention with RW. Pt completed toileting tasks (+) urine void with CGA. Good bimanaul use. Pt declined leaving room 2/2 fatigue. Pt agreeable to completing activity in room with focus on RUE and R visual neglect/field cut. Pt completed line bisection activity, holding pen in her R hand. Pt able to find all lines, but required increased time for scanning to R side of the paper. Discussed L to R  visual scanning strategy and use of an anchor. Pt able to carryover discussed strategies for circle scanning task. Pt still demonstrates L bias, difficulty with aiming at center of small circles. Pt was left sitting up in the w/c with her daughter present.   Therapy Documentation Precautions:  Precautions Precautions: Fall Precaution Comments: history of vertigo, R field cut Restrictions Weight Bearing Restrictions: No   Therapy/Group: Individual Therapy  Curtis Sites 09/14/2020, 6:46 AM

## 2020-09-14 NOTE — Progress Notes (Signed)
Physical Therapy Session Note  Patient Details  Name: Tara [name] MRN: [id number] Date of Birth: 1936-11-05  Today's Date: 09/14/2020 PT Individual Time: 0900-1000 PT Individual Time Calculation (min): 60 min   Short Term Goals: Week 2:  PT Short Term Goal 1 (Week 2): STG=LTG based on ELOS  Skilled Therapeutic Interventions/Progress Updates:    Patient received sitting up in bed, agreeable to PT. She denies pain. Patient able to come to sitting edge of bed with supervision. CGA for stand pivot to wc R leading, which is an improvement from before. PT propelling patient in wc to therapy gym for time management. She was able to remain standing >61mins with supervision and BUE support on table to construct pipe tree based off of picture. She has completed this same structure 3x before, but continues to require up to Max verbal cuing for accuracy. Patient initially had access to all pipe pieces, but was demonstrating significant difficulty finding correct piece based on picture. PT removing all pieces necessary to construct structure to minimize distracting pieces. Patient continued to require up to Max verbal cuing for accuracy of completion. She completed seated card task with minimal cuing for accuracy. She was able to find cards that add up to 10 for improved visual scanning. She continues to demonstrate significant difficulty with visual scanning, compensating for R visual field cut, and motor planning, which effect patients ability to complete all functional mobility safely and independently. Patient returning to room in wc, seatbelt alarm on, call light within reach.   Therapy Documentation Precautions:  Precautions Precautions: Fall Precaution Comments: history of vertigo, R field cut Restrictions Weight Bearing Restrictions: No    Therapy/Group: Individual Therapy  Karoline Caldwell, PT, DPT, CBIS 09/14/2020, 7:36 AM

## 2020-09-14 NOTE — Progress Notes (Signed)
[place] PHYSICAL MEDICINE & REHABILITATION PROGRESS NOTE   Subjective/Complaints:   Spoke with daughter via phone toady about d/c planning, potential for improvement over the next month (s)   Perceptual issues related to stroke are interfering with safety.    ROS: Denies CP, SOB, N/V/D  Objective:   No results found. No results for input(s): WBC, HGB, HCT, PLT in the last 72 hours. No results for input(s): NA, K, CL, CO2, GLUCOSE, BUN, CREATININE, CALCIUM in the last 72 hours.  Intake/Output Summary (Last 24 hours) at 09/14/2020 1002 Last data filed at 09/14/2020 0717 Gross per 24 hour  Intake 574 ml  Output --  Net 574 ml        Physical Exam: Vital Signs Blood pressure (!) 133/59, pulse (!) 52, temperature 98.4 F (36.9 C), temperature source Oral, resp. rate 16, height 5\' 5"  (1.651 m), weight 67.7 kg, SpO2 97 %.   General: No acute distress Mood and affect are appropriate Heart: Regular rate and rhythm no rubs murmurs or extra sounds Lungs: Clear to auscultation, breathing unlabored, no rales or wheezes Abdomen: Positive bowel sounds, soft nontender to palpation, nondistended Extremities: No clubbing, cyanosis, or edema Skin: No evidence of breakdown, no evidence of rash   Neurologic: Alert Motor: 4-4+/5 throughout, unchanged  Assessment/Plan: 1. Functional deficits secondary to Left P-O ICH  which require 3+ hours per day of interdisciplinary therapy in a comprehensive inpatient rehab setting.  Physiatrist is providing close team supervision and 24 hour management of active medical problems listed below.  Physiatrist and rehab team continue to assess barriers to discharge/monitor patient progress toward functional and medical goals  Care Tool:  Bathing    Body parts bathed by patient: Right arm, Left arm, Chest, Abdomen, Front perineal area, Buttocks, Right upper leg, Left upper leg, Face, Right lower leg, Left lower leg   Body parts bathed by helper:  Left lower leg, Right lower leg Body parts n/a: Right lower leg, Left lower leg   Bathing assist Assist Level: Minimal Assistance - Patient > 75%     Upper Body Dressing/Undressing Upper body dressing   What is the patient wearing?: Bra    Upper body assist Assist Level: Minimal Assistance - Patient > 75%    Lower Body Dressing/Undressing Lower body dressing      What is the patient wearing?: Pants, Incontinence brief     Lower body assist Assist for lower body dressing: Minimal Assistance - Patient > 75%     Toileting Toileting    Toileting assist Assist for toileting: Minimal Assistance - Patient > 75%     Transfers Chair/bed transfer  Transfers assist     Chair/bed transfer assist level: Minimal Assistance - Patient > 75% Chair/bed transfer assistive device: Museum/gallery exhibitions officer assist      Assist level: Contact Guard/Touching assist Assistive device: Walker-rolling Max distance: 71ft   Walk 10 feet activity   Assist     Assist level: Contact Guard/Touching assist Assistive device: Walker-rolling   Walk 50 feet activity   Assist Walk 50 feet with 2 turns activity did not occur: Safety/medical concerns (patient fatigue)  Assist level: Contact Guard/Touching assist Assistive device: Walker-rolling    Walk 150 feet activity   Assist Walk 150 feet activity did not occur: Safety/medical concerns (patient fatigue)  Assist level: Minimal Assistance - Patient > 75% Assistive device: Walker-rolling    Walk 10 feet on uneven surface  activity   Assist Walk 10 feet  on uneven surfaces activity did not occur: Safety/medical concerns (patient fatigue)         Wheelchair     Assist Will patient use wheelchair at discharge?: No Type of Wheelchair: Manual    Wheelchair assist level: Minimal Assistance - Patient > 75% Max wheelchair distance: 150    Wheelchair 50 feet with 2 turns activity    Assist         Assist Level: Minimal Assistance - Patient > 75%   Wheelchair 150 feet activity     Assist      Assist Level: Minimal Assistance - Patient > 75%   Medical Problem List and Plan: 1.  Decreased functional mobility with aphasia secondary to left parieto-occipital acute ICH and B/L cerebral infarcts- prior leg braces worn by pt- ?AFO's  Continue CIR 2.  Antithrombotics: -DVT/anticoagulation: SCDs-  Due to [place]             -antiplatelet therapy: N/A 3. Pain Management: Tylenol as needed  Controlled on 10/23 4. Mood:   Valium 5 mg twice daily as needed anxiety switched with Ativan which is better for vertigo-                 -antipsychotic agents: N/A 5. Neuropsych: This patient is capable of making decisions on her own behalf. 6. Skin/Wound Care: Routine skin checks 7. Fluids/Electrolytes/Nutrition: Routine in and outs recorded intake 554ml yest, meals 60-100%   8.  Hypertension.  Cozaar 50 mg daily, increased to 100  Norvasc 10 mg daily.     Monitor with increased mobility Vitals:   09/13/20 2028 09/14/20 0510  BP: 123/68 (!) 133/59  Pulse: (!) 56 (!) 52  Resp: 15 16  Temp: 98.4 F (36.9 C) 98.4 F (36.9 C)  SpO2: 97% 97%   Labile and ?trending up on 10/24, monitor for trend 9.  Hypothyroidism.  TSH 1.722.  Continue Synthroid 10. Vertigo-improved with scopolamine patch  See #4 11.  UTI  pseudomonas, sens to levaquin ( has cephalosporin allergy) , cont  250mg  qd, completed , 5 d tx   -sx resolved- urgency likely due to spastic bladder post CVA  12.  Leukocytosis-likely secondary to UTI  WBC 11.2 on 10/8, repeat pnd   LOS: 18 days A FACE TO FACE EVALUATION WAS PERFORMED  Charlett Blake 09/14/2020, 10:02 AM

## 2020-09-14 NOTE — Progress Notes (Signed)
Occupational Therapy Discharge Summary  Patient Details  Name: Tara [name] MRN: [id number] Date of Birth: [birth year]   Patient has met 8 of 12 long term goals due to improved activity tolerance, improved balance, postural control, ability to compensate for deficits, improved attention and improved awareness.  Patient to discharge at [facility] Assist level.  Patient's care partner unavailable to provide the necessary physical and cognitive assistance at discharge.    Reasons goals not met: Pt needs min assist for transfers to the toilet as well as for LB dressing tasks and dynamic balance  Recommendation:  Patient will benefit from ongoing skilled OT services in skilled nursing facility setting to continue to advance functional skills in the area of BADL and Reduce care partner burden.  Pt continues to demonstrate severe right visual field deficit as well as increased visual processing and perceptual deficits impacting ADL function.  She continues to exhibit decreased memory and carryover from session to session as well as awareness of her deficit and the ability to compensate for them with functional tasks.  She will continue to benefit from continued 24 hour supervision as well as increased OT to progress to supervision level/modified independent level.   Equipment: No equipment provided  Reasons for discharge: treatment goals met and discharge from hospital  Patient/family agrees with progress made and goals achieved: Yes  OT Discharge Precautions/Restrictions  Restrictions Weight Bearing Restrictions: No  Pain Pain Assessment Pain Score: 0-No pain ADL ADL Eating: Modified independent Where Assessed-Eating: Wheelchair Grooming: Modified independent Where Assessed-Grooming: Wheelchair Upper Body Bathing: Setup Where Assessed-Upper Body Bathing: Shower Lower Body Bathing: Minimal assistance Where Assessed-Lower Body Bathing: Shower Upper Body Dressing: Setup Where  Assessed-Upper Body Dressing: Wheelchair Lower Body Dressing: Moderate assistance Where Assessed-Lower Body Dressing: Wheelchair Toileting: Contact guard Where Assessed-Toileting: Bedside Commode Toilet Transfer: Minimal assistance Toilet Transfer Method: Counselling psychologist: Radiographer, therapeutic: Not assessed Social research officer, government: Minimal assistance Social research officer, government Method: Heritage manager: Gaffer Baseline Vision/History: Wears glasses Wears Glasses: Reading only Patient Visual Report: No change from baseline Vision Assessment?: Yes Eye Alignment: Within Functional Limits Ocular Range of Motion: Within Functional Limits Alignment/Gaze Preference: Within Defined Limits Tracking/Visual Pursuits: Decreased smoothness of horizontal tracking;Decreased smoothness of vertical tracking;Requires cues, head turns, or add eye shifts to track Saccades: Additional head turns occurred during testing Convergence: Impaired (comment) Visual Fields: Right visual field deficit;Right homonymous hemianopsia Perception  Perception: Impaired Inattention/Neglect: Does not attend to right visual field Figure Ground: Pt with decreased ability to reproduce clock design of house secondary to figure ground and perceptual deficits Praxis Praxis: Impaired Praxis Impairment Details: Motor planning Praxis-Other Comments: Pt with dressing apraxia and decreased ability to determine parts of clothing and sequencing of dressing. Cognition Overall Cognitive Status: Impaired/Different from baseline Arousal/Alertness: Awake/alert Orientation Level: Disoriented to time;Oriented to situation Attention: Sustained;Selective Focused Attention: Appears intact Sustained Attention: Impaired Sustained Attention Impairment: Verbal basic;Functional basic Selective Attention: Impaired Selective Attention Impairment: Functional basic Memory:  Impaired Memory Impairment: Retrieval deficit;Storage deficit;Decreased short term memory;Decreased recall of new information Decreased Short Term Memory: Verbal basic;Functional basic Awareness Impairment: Anticipatory impairment Problem Solving: Impaired Problem Solving Impairment: Functional basic Sequencing: Impaired Sequencing Impairment: Verbal basic;Functional basic Organizing: Impaired Organizing Impairment: Verbal basic;Functional basic Decision Making: Impaired Decision Making Impairment: Functional basic Self Monitoring: Impaired Self Monitoring Impairment: Verbal basic;Verbal complex Behaviors: Poor frustration tolerance Safety/Judgment: Impaired Sensation Sensation Light Touch: Appears Intact Hot/Cold: Appears Intact Proprioception: Appears Intact Stereognosis: Appears Intact Coordination  Gross Motor Movements are Fluid and Coordinated: Yes Fine Motor Movements are Fluid and Coordinated: Yes Coordination and Movement Description: FM and gross motor coordination WFLs in BUEs Motor  Motor Motor: Abnormal postural alignment and control;Motor apraxia Motor - Discharge Observations: slight posterior lean with initial standing, decreased motor planning simple tasks Mobility  Bed Mobility Bed Mobility: Rolling Right;Rolling Left;Supine to Sit;Sit to Supine Rolling Right: Independent with assistive device Rolling Left: Independent with assistive device Supine to Sit: Supervision/Verbal cueing Sitting - Scoot to Edge of Bed: Supervision/Verbal cueing Sit to Supine: Supervision/Verbal cueing Transfers Sit to Stand: Contact Guard/Touching assist Stand to Sit: Contact Guard/Touching assist  Trunk/Postural Assessment  Cervical Assessment Cervical Assessment: Exceptions to [facility] (forward head) Thoracic Assessment Thoracic Assessment: Exceptions to [facility] (thoracic kyphosis) Lumbar Assessment Lumbar Assessment: Exceptions to [facility] (posterior pelvic tilt) Postural  Control Righting Reactions: delayed and inadequate Protective Responses: delayed and inadequate  Balance Balance Balance Assessed: Yes Static Sitting Balance Static Sitting - Balance Support: Feet supported Static Sitting - Level of Assistance: 6: Modified independent (Device/Increase time) Dynamic Sitting Balance Dynamic Sitting - Balance Support: During functional activity Dynamic Sitting - Level of Assistance: 5: Stand by assistance Static Standing Balance Static Standing - Balance Support: During functional activity Static Standing - Level of Assistance: 5: Stand by assistance Dynamic Standing Balance Dynamic Standing - Balance Support: During functional activity Dynamic Standing - Level of Assistance: 4: Min assist Extremity/Trunk Assessment RUE Assessment RUE Assessment: Within Functional Limits General Strength Comments: strength 4/5 throughout LUE Assessment LUE Assessment: Within Functional Limits General Strength Comments: strength 4/5   Baden Betsch OTR/L 09/14/2020, 9:34 PM

## 2020-09-14 NOTE — Progress Notes (Signed)
Physical Therapy Session Note  Patient Details  Name: MERICA PRELL MRN: [id number] Date of Birth: Oct 06, 1936  Today's Date: 09/14/2020 PT Individual Time: 1300-1343 PT Individual Time Calculation (min): 43 min   Short Term Goals: Week 2:  PT Short Term Goal 1 (Week 2): STG=LTG based on ELOS  Skilled Therapeutic Interventions/Progress Updates:    Patient received sitting up in wc, agreeable to PT. She denies pain, but endorses fatigue. PT propelling patient in wc to therapy gym for time management and energy conservation. She was able to ambulate 4ft weaving thru cones with RW + CGA. Min verbal cues needed to avoid cones when passing on R side. Patient appears to have greatest difficulty when cone is immediately in front of her and to the right, but she can scan and see cone when its ahead and to the right, roughly 40ft ahead. Patient completing Dynavision seated: reaction time 2.40s (R: 2.06s, L: 2.67s) and again seated: 2.79s (R 2.22s, L: 3.52s). Patient then completing Dynavision standing: 4.14s (R 2.85s, L 5.24s). She demonstrates greatest difficulty scanning R and remembering to compensate for R field cut by scanning when completing a dual task, such as standing. Patient returning to bed via stand pivot with CGA. Bed alarm on, call light within reach.   Therapy Documentation Precautions:  Precautions Precautions: Fall Precaution Comments: history of vertigo, R field cut Restrictions Weight Bearing Restrictions: No    Therapy/Group: Individual Therapy  Karoline Caldwell, PT, DPT, CBIS 09/14/2020, 1:44 PM

## 2020-09-14 NOTE — Progress Notes (Addendum)
Patient ID: Tara [name], female   DOB: 1936/04/30, 84 y.o.   MRN: [id number]  SW began auth for SNF approval. Pending authorization number: [id number]. Faxing Clinicals faxed to [phone]

## 2020-09-14 NOTE — Progress Notes (Signed)
Speech Language Pathology Daily Session Note  Patient Details  Name: Tara [name] MRN: [id number] Date of Birth: July 20, 1936  Today's Date: 09/14/2020 SLP Individual Time: 0916-0956 SLP Individual Time Calculation (min): 40 min  Short Term Goals: Week 3: SLP Short Term Goal 1 (Week 3): LTG=STG due to remaining length of stay; pt's length of stay unexpectedly extended, however SNF d/c anticipated this week  Skilled Therapeutic Interventions: Pt was seen for skilled ST targeting cognition. Pt required Max A initially to recall rules of familiar basic card task, however Mod faded to Min A verbal cues for recall within task (11 up). Her calculations during card task were also 95% accurate (much improved). Pt required overall Mod A for basic problem solving and recall when interpreting a weekly weather forecast. Mod A also required for functional use of memory notebook. Overall Min A provided for redirection to tasks in a quiet environment today. Pt left sitting in chair with alarm set and needs within reach. Continue per current plan of care.          Pain Pain Assessment Pain Scale: 0-10 Pain Score: 0-No pain   Therapy/Group: Individual Therapy  Arbutus Leas 09/14/2020, 7:22 AM

## 2020-09-15 DIAGNOSIS — I69220 Aphasia following other nontraumatic intracranial hemorrhage: Secondary | ICD-10-CM | POA: Diagnosis not present

## 2020-09-15 DIAGNOSIS — N319 Neuromuscular dysfunction of bladder, unspecified: Secondary | ICD-10-CM | POA: Diagnosis not present

## 2020-09-15 DIAGNOSIS — E039 Hypothyroidism, unspecified: Secondary | ICD-10-CM | POA: Diagnosis not present

## 2020-09-15 DIAGNOSIS — G458 Other transient cerebral ischemic attacks and related syndromes: Secondary | ICD-10-CM | POA: Diagnosis not present

## 2020-09-15 DIAGNOSIS — N390 Urinary tract infection, site not specified: Secondary | ICD-10-CM | POA: Diagnosis not present

## 2020-09-15 DIAGNOSIS — R4182 Altered mental status, unspecified: Secondary | ICD-10-CM | POA: Diagnosis not present

## 2020-09-15 DIAGNOSIS — R519 Headache, unspecified: Secondary | ICD-10-CM | POA: Diagnosis not present

## 2020-09-15 DIAGNOSIS — Z96651 Presence of right artificial knee joint: Secondary | ICD-10-CM | POA: Diagnosis not present

## 2020-09-15 DIAGNOSIS — M48062 Spinal stenosis, lumbar region with neurogenic claudication: Secondary | ICD-10-CM | POA: Diagnosis not present

## 2020-09-15 DIAGNOSIS — E038 Other specified hypothyroidism: Secondary | ICD-10-CM | POA: Diagnosis not present

## 2020-09-15 DIAGNOSIS — F418 Other specified anxiety disorders: Secondary | ICD-10-CM | POA: Diagnosis not present

## 2020-09-15 DIAGNOSIS — I618 Other nontraumatic intracerebral hemorrhage: Secondary | ICD-10-CM | POA: Diagnosis not present

## 2020-09-15 DIAGNOSIS — I69298 Other sequelae of other nontraumatic intracranial hemorrhage: Secondary | ICD-10-CM | POA: Diagnosis not present

## 2020-09-15 DIAGNOSIS — Z96652 Presence of left artificial knee joint: Secondary | ICD-10-CM | POA: Diagnosis not present

## 2020-09-15 DIAGNOSIS — N318 Other neuromuscular dysfunction of bladder: Secondary | ICD-10-CM

## 2020-09-15 DIAGNOSIS — I619 Nontraumatic intracerebral hemorrhage, unspecified: Secondary | ICD-10-CM | POA: Diagnosis not present

## 2020-09-15 DIAGNOSIS — M5416 Radiculopathy, lumbar region: Secondary | ICD-10-CM | POA: Diagnosis not present

## 2020-09-15 DIAGNOSIS — E559 Vitamin D deficiency, unspecified: Secondary | ICD-10-CM | POA: Diagnosis not present

## 2020-09-15 DIAGNOSIS — F419 Anxiety disorder, unspecified: Secondary | ICD-10-CM | POA: Diagnosis not present

## 2020-09-15 DIAGNOSIS — H8110 Benign paroxysmal vertigo, unspecified ear: Secondary | ICD-10-CM | POA: Diagnosis not present

## 2020-09-15 DIAGNOSIS — I10 Essential (primary) hypertension: Secondary | ICD-10-CM | POA: Diagnosis not present

## 2020-09-15 DIAGNOSIS — I610 Nontraumatic intracerebral hemorrhage in hemisphere, subcortical: Secondary | ICD-10-CM | POA: Diagnosis not present

## 2020-09-15 DIAGNOSIS — R5381 Other malaise: Secondary | ICD-10-CM | POA: Diagnosis not present

## 2020-09-15 MED ORDER — LORAZEPAM 0.5 MG PO TABS
0.5000 mg | ORAL_TABLET | Freq: Two times a day (BID) | ORAL | 0 refills | Status: AC | PRN
Start: 2020-09-15 — End: ?

## 2020-09-15 NOTE — Progress Notes (Signed)
Patient ID: Tara [name], female   DOB: 05/01/1936, 84 y.o.   MRN: [id number]   Per family request, family will be here at 12:00PM to transport patient to SNF.   [place], [place]

## 2020-09-15 NOTE — Progress Notes (Signed)
[facility] PHYSICAL MEDICINE & REHABILITATION PROGRESS NOTE   Subjective/Complaints:     Perceptual issues related to stroke are interfering with safety.    ROS: Denies CP, SOB, N/V/D  Objective:   No results found. Recent Labs    09/14/20 1419  WBC 6.7  HGB 12.7  HCT 38.0  PLT 238   No results for input(s): NA, K, CL, CO2, GLUCOSE, BUN, CREATININE, CALCIUM in the last 72 hours.  Intake/Output Summary (Last 24 hours) at 09/15/2020 0819 Last data filed at 09/14/2020 2000 Gross per 24 hour  Intake 400 ml  Output --  Net 400 ml        Physical Exam: Vital Signs Blood pressure (!) 152/65, pulse (!) 56, temperature 97.6 F (36.4 C), resp. rate 18, height 5\' 5"  (1.651 m), weight 68.3 kg, SpO2 92 %.    General: No acute distress Mood and affect are appropriate Heart: Regular rate and rhythm no rubs murmurs or extra sounds Lungs: Clear to auscultation, breathing unlabored, no rales or wheezes Abdomen: Positive bowel sounds, soft nontender to palpation, nondistended Extremities: No clubbing, cyanosis, or edema Skin: No evidence of breakdown, no evidence of rash Neurologic: Alert Motor: 4-4+/5 throughout, unchanged  Assessment/Plan:  1. Functional deficits secondary to Left P-O ICH   Stable for D/C today F/u PCP in SNF F/u PM&R 4 weeks See D/C summary See D/C instructions  Care Tool:  Bathing    Body parts bathed by patient: Right arm, Left arm, Chest, Abdomen, Front perineal area, Buttocks, Right upper leg, Left upper leg, Face, Right lower leg, Left lower leg   Body parts bathed by helper: Left lower leg, Right lower leg Body parts n/a: Right lower leg, Left lower leg   Bathing assist Assist Level: Contact Guard/Touching assist     Upper Body Dressing/Undressing Upper body dressing   What is the patient wearing?: Bra, Pull over shirt    Upper body assist Assist Level: Supervision/Verbal cueing    Lower Body Dressing/Undressing Lower body  dressing      What is the patient wearing?: Pants, Incontinence brief     Lower body assist Assist for lower body dressing: Moderate Assistance - Patient 50 - 74%     Toileting Toileting    Toileting assist Assist for toileting: Contact Guard/Touching assist     Transfers Chair/bed transfer  Transfers assist     Chair/bed transfer assist level: Contact Guard/Touching assist Chair/bed transfer assistive device: Programmer, multimedia   Ambulation assist      Assist level: Contact Guard/Touching assist Assistive device: Walker-rolling Max distance: 39ft   Walk 10 feet activity   Assist     Assist level: Contact Guard/Touching assist Assistive device: Walker-rolling   Walk 50 feet activity   Assist Walk 50 feet with 2 turns activity did not occur: Safety/medical concerns (patient fatigue)  Assist level: Contact Guard/Touching assist Assistive device: Walker-rolling    Walk 150 feet activity   Assist Walk 150 feet activity did not occur: Safety/medical concerns (patient fatigue)  Assist level: Minimal Assistance - Patient > 75% Assistive device: Walker-rolling    Walk 10 feet on uneven surface  activity   Assist Walk 10 feet on uneven surfaces activity did not occur: Safety/medical concerns (patient fatigue)         Wheelchair     Assist Will patient use wheelchair at discharge?: No Type of Wheelchair: Manual    Wheelchair assist level: Minimal Assistance - Patient > 75% Max wheelchair distance: 150  Wheelchair 50 feet with 2 turns activity    Assist        Assist Level: Minimal Assistance - Patient > 75%   Wheelchair 150 feet activity     Assist      Assist Level: Minimal Assistance - Patient > 75%   Medical Problem List and Plan: 1.  Decreased functional mobility with aphasia secondary to left parieto-occipital acute ICH and B/L cerebral infarcts- prior leg braces worn by pt- ?AFO's  DC to SNF  today  2.  Antithrombotics: -DVT/anticoagulation: SCDs-  Due to [place]             -antiplatelet therapy: N/A 3. Pain Management: Tylenol as needed  Controlled on 10/23 4. Mood:   Valium 5 mg twice daily as needed anxiety switched with Ativan which is better for vertigo-                 -antipsychotic agents: N/A 5. Neuropsych: This patient is capable of making decisions on her own behalf. 6. Skin/Wound Care: Routine skin checks 7. Fluids/Electrolytes/Nutrition: Routine in and outs recorded intake 575ml yest, meals 60-100%   8.  Hypertension.  Cozaar 50 mg daily, increased to 100  Norvasc 10 mg daily.     Monitor with increased mobility Vitals:   09/14/20 2016 09/15/20 0502  BP: (!) 115/55 (!) 152/65  Pulse: (!) 53 (!) 56  Resp: 15 18  Temp: 97.6 F (36.4 C) 97.6 F (36.4 C)  SpO2: 96% 92%   Some lability but review of flowsheet indicate this am is outlier not a trend  9.  Hypothyroidism.  TSH 1.722.  Continue Synthroid 10. Vertigo-improved with scopolamine patch  See #4 11.  UTI  pseudomonas, sens to levaquin ( has cephalosporin allergy) , cont  250mg  qd, completed , 5 d tx   -sx resolved- urgency likely due to spastic bladder post CVA  12.  Leukocytosis-likely secondary to UTI  WBC 11.2 on 10/8, repeat pnd   LOS: 19 days A FACE TO [place] Tara [name] 09/15/2020, 8:19 AM

## 2020-09-15 NOTE — Progress Notes (Signed)
Patient anticipates discharge to SNF today

## 2020-09-15 NOTE — Discharge Instructions (Signed)
Inpatient Rehab Discharge Instructions  Tara [name] Discharge date and time: 09/15/20   Activities/Precautions/ Functional Status: Activity: activity as tolerated Diet: Soft   Wound Care: Routine skin checks Functional status:  ___ No restrictions     ___ Walk up steps independently ___ 24/7 supervision/assistance   ___ Walk up steps with assistance ___ Intermittent supervision/assistance  ___ Bathe/dress independently ___ Walk with walker     _x__ Bathe/dress with assistance ___ Walk Independently    ___ Shower independently ___ Walk with assistance    ___ Shower with assistance ___ No alcohol     ___ Return to work/school ________  Special Instructions:  No driving smoking or alcohol  My questions have been answered and I understand these instructions. I will adhere to these goals and the provided educational materials after my discharge from the hospital.  Patient/Caregiver Signature _______________________________ Date __________  Clinician Signature _______________________________________ Date __________  Please bring this form and your medication list with you to all your follow-up doctor's appointments.

## 2020-09-15 NOTE — Progress Notes (Signed)
Patient states she will be leaving tomorrow going to a facility in [place], [place],   and happy her family has found her another home.Appears in good spirits, she is aware she can not take care of herself without help

## 2020-09-15 NOTE — Plan of Care (Signed)
Mod assist

## 2020-09-15 NOTE — Progress Notes (Signed)
Inpatient Rehabilitation Care Coordinator  Discharge Note  The overall goal for the admission was met for:   Discharge location: Yes, SNF [facility])  Length of Stay: Yes, 19 Days  Discharge activity level: Yes, Min A  Home/community participation: Yes  Services provided included: MD, RD, PT, OT, SLP, RN, CM, TR, Pharmacy, Neuropsych and SW  Financial Services: Private Insurance: [employer]  Follow-up services arranged: Other: Discharge to SNF ( 5x a week- PT, OT, SLP)  Comments (or additional information):  Patient/Family verbalized understanding of follow-up arrangements: Yes  Individual responsible for coordination of the follow-up plan: Juliette Mangle), [phone]  Confirmed correct DME delivered: [name] 09/15/2020    [name]

## 2020-09-15 NOTE — Plan of Care (Signed)
Mod assist with adls 

## 2020-09-21 DIAGNOSIS — I618 Other nontraumatic intracerebral hemorrhage: Secondary | ICD-10-CM | POA: Diagnosis not present

## 2020-09-21 DIAGNOSIS — R5381 Other malaise: Secondary | ICD-10-CM | POA: Diagnosis not present

## 2020-09-21 DIAGNOSIS — I10 Essential (primary) hypertension: Secondary | ICD-10-CM | POA: Diagnosis not present

## 2020-09-21 DIAGNOSIS — M5416 Radiculopathy, lumbar region: Secondary | ICD-10-CM | POA: Diagnosis not present

## 2020-09-25 ENCOUNTER — Encounter: Payer: Medicare Other | Admitting: Physical Medicine & Rehabilitation

## 2020-09-28 DIAGNOSIS — N390 Urinary tract infection, site not specified: Secondary | ICD-10-CM | POA: Diagnosis not present

## 2020-09-28 DIAGNOSIS — G458 Other transient cerebral ischemic attacks and related syndromes: Secondary | ICD-10-CM | POA: Diagnosis not present

## 2020-09-28 DIAGNOSIS — E038 Other specified hypothyroidism: Secondary | ICD-10-CM | POA: Diagnosis not present

## 2020-09-30 DIAGNOSIS — F418 Other specified anxiety disorders: Secondary | ICD-10-CM | POA: Diagnosis not present

## 2020-09-30 DIAGNOSIS — I10 Essential (primary) hypertension: Secondary | ICD-10-CM | POA: Diagnosis not present

## 2020-10-19 DIAGNOSIS — F418 Other specified anxiety disorders: Secondary | ICD-10-CM | POA: Diagnosis not present

## 2020-10-19 DIAGNOSIS — R42 Dizziness and giddiness: Secondary | ICD-10-CM | POA: Diagnosis not present

## 2020-10-19 DIAGNOSIS — E038 Other specified hypothyroidism: Secondary | ICD-10-CM | POA: Diagnosis not present

## 2020-10-19 DIAGNOSIS — I10 Essential (primary) hypertension: Secondary | ICD-10-CM | POA: Diagnosis not present

## 2020-10-26 DIAGNOSIS — F418 Other specified anxiety disorders: Secondary | ICD-10-CM | POA: Diagnosis not present

## 2020-10-26 DIAGNOSIS — I10 Essential (primary) hypertension: Secondary | ICD-10-CM | POA: Diagnosis not present

## 2020-10-26 DIAGNOSIS — E038 Other specified hypothyroidism: Secondary | ICD-10-CM | POA: Diagnosis not present

## 2020-10-26 DIAGNOSIS — R42 Dizziness and giddiness: Secondary | ICD-10-CM | POA: Diagnosis not present

## 2020-10-27 DIAGNOSIS — F064 Anxiety disorder due to known physiological condition: Secondary | ICD-10-CM | POA: Diagnosis not present

## 2020-10-27 DIAGNOSIS — F331 Major depressive disorder, recurrent, moderate: Secondary | ICD-10-CM | POA: Diagnosis not present

## 2020-10-28 ENCOUNTER — Ambulatory Visit (INDEPENDENT_AMBULATORY_CARE_PROVIDER_SITE_OTHER): Payer: Medicare Other | Admitting: Neurology

## 2020-10-28 ENCOUNTER — Encounter: Payer: Self-pay | Admitting: Neurology

## 2020-10-28 VITALS — BP 108/68 | HR 70 | Ht 65.0 in | Wt 150.0 lb

## 2020-10-28 DIAGNOSIS — F411 Generalized anxiety disorder: Secondary | ICD-10-CM | POA: Diagnosis not present

## 2020-10-28 DIAGNOSIS — M545 Low back pain, unspecified: Secondary | ICD-10-CM | POA: Diagnosis not present

## 2020-10-28 DIAGNOSIS — C50911 Malignant neoplasm of unspecified site of right female breast: Secondary | ICD-10-CM | POA: Diagnosis not present

## 2020-10-28 DIAGNOSIS — G8929 Other chronic pain: Secondary | ICD-10-CM | POA: Diagnosis not present

## 2020-10-28 DIAGNOSIS — I610 Nontraumatic intracerebral hemorrhage in hemisphere, subcortical: Secondary | ICD-10-CM

## 2020-10-28 DIAGNOSIS — R7303 Prediabetes: Secondary | ICD-10-CM | POA: Diagnosis not present

## 2020-10-28 DIAGNOSIS — R42 Dizziness and giddiness: Secondary | ICD-10-CM | POA: Diagnosis not present

## 2020-10-28 DIAGNOSIS — I10 Essential (primary) hypertension: Secondary | ICD-10-CM | POA: Diagnosis not present

## 2020-10-28 DIAGNOSIS — I69198 Other sequelae of nontraumatic intracerebral hemorrhage: Secondary | ICD-10-CM | POA: Diagnosis not present

## 2020-10-28 DIAGNOSIS — I618 Other nontraumatic intracerebral hemorrhage: Secondary | ICD-10-CM | POA: Diagnosis not present

## 2020-10-28 DIAGNOSIS — D649 Anemia, unspecified: Secondary | ICD-10-CM | POA: Diagnosis not present

## 2020-10-28 DIAGNOSIS — F32A Depression, unspecified: Secondary | ICD-10-CM | POA: Diagnosis not present

## 2020-10-28 DIAGNOSIS — R410 Disorientation, unspecified: Secondary | ICD-10-CM | POA: Diagnosis not present

## 2020-10-28 DIAGNOSIS — M5416 Radiculopathy, lumbar region: Secondary | ICD-10-CM | POA: Diagnosis not present

## 2020-10-28 DIAGNOSIS — R278 Other lack of coordination: Secondary | ICD-10-CM | POA: Diagnosis not present

## 2020-10-28 DIAGNOSIS — E038 Other specified hypothyroidism: Secondary | ICD-10-CM | POA: Diagnosis not present

## 2020-10-28 DIAGNOSIS — N318 Other neuromuscular dysfunction of bladder: Secondary | ICD-10-CM | POA: Diagnosis not present

## 2020-10-28 DIAGNOSIS — R5381 Other malaise: Secondary | ICD-10-CM | POA: Diagnosis not present

## 2020-10-28 DIAGNOSIS — I69120 Aphasia following nontraumatic intracerebral hemorrhage: Secondary | ICD-10-CM | POA: Diagnosis not present

## 2020-10-28 DIAGNOSIS — M4800 Spinal stenosis, site unspecified: Secondary | ICD-10-CM | POA: Diagnosis not present

## 2020-10-28 DIAGNOSIS — E039 Hypothyroidism, unspecified: Secondary | ICD-10-CM | POA: Diagnosis not present

## 2020-10-28 MED ORDER — DIVALPROEX SODIUM ER 500 MG PO TB24
500.0000 mg | ORAL_TABLET | Freq: Every day | ORAL | 11 refills | Status: AC
Start: 2020-10-28 — End: ?

## 2020-10-28 MED ORDER — DIVALPROEX SODIUM ER 500 MG PO TB24: 500.0000 mg | ORAL_TABLET | Freq: Every day | ORAL | 11 refills | Status: DC
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
## 2020-10-31 DIAGNOSIS — I619 Nontraumatic intracerebral hemorrhage, unspecified: Secondary | ICD-10-CM | POA: Diagnosis not present

## 2020-10-31 DIAGNOSIS — I6782 Cerebral ischemia: Secondary | ICD-10-CM | POA: Diagnosis not present

## 2020-10-31 DIAGNOSIS — Z8673 Personal history of transient ischemic attack (TIA), and cerebral infarction without residual deficits: Secondary | ICD-10-CM | POA: Diagnosis not present

## 2020-10-31 DIAGNOSIS — I680 Cerebral amyloid angiopathy: Secondary | ICD-10-CM | POA: Diagnosis not present

## 2020-10-31 DIAGNOSIS — I083 Combined rheumatic disorders of mitral, aortic and tricuspid valves: Secondary | ICD-10-CM | POA: Diagnosis not present

## 2020-10-31 DIAGNOSIS — I639 Cerebral infarction, unspecified: Secondary | ICD-10-CM | POA: Diagnosis not present

## 2020-10-31 DIAGNOSIS — R4182 Altered mental status, unspecified: Secondary | ICD-10-CM | POA: Diagnosis not present

## 2020-10-31 DIAGNOSIS — R4781 Slurred speech: Secondary | ICD-10-CM | POA: Diagnosis not present

## 2020-10-31 DIAGNOSIS — Z7982 Long term (current) use of aspirin: Secondary | ICD-10-CM | POA: Diagnosis not present

## 2020-10-31 DIAGNOSIS — G9340 Encephalopathy, unspecified: Secondary | ICD-10-CM | POA: Diagnosis not present

## 2020-10-31 DIAGNOSIS — G9389 Other specified disorders of brain: Secondary | ICD-10-CM | POA: Diagnosis not present

## 2020-10-31 DIAGNOSIS — E854 Organ-limited amyloidosis: Secondary | ICD-10-CM | POA: Diagnosis not present

## 2020-10-31 DIAGNOSIS — R8271 Bacteriuria: Secondary | ICD-10-CM | POA: Diagnosis not present

## 2020-10-31 DIAGNOSIS — N390 Urinary tract infection, site not specified: Secondary | ICD-10-CM | POA: Diagnosis not present

## 2020-10-31 DIAGNOSIS — F329 Major depressive disorder, single episode, unspecified: Secondary | ICD-10-CM | POA: Diagnosis not present

## 2020-10-31 DIAGNOSIS — H53461 Homonymous bilateral field defects, right side: Secondary | ICD-10-CM | POA: Diagnosis not present

## 2020-10-31 DIAGNOSIS — I6522 Occlusion and stenosis of left carotid artery: Secondary | ICD-10-CM | POA: Diagnosis not present

## 2020-10-31 DIAGNOSIS — E039 Hypothyroidism, unspecified: Secondary | ICD-10-CM | POA: Diagnosis not present

## 2020-10-31 DIAGNOSIS — F05 Delirium due to known physiological condition: Secondary | ICD-10-CM | POA: Diagnosis not present

## 2020-10-31 DIAGNOSIS — I10 Essential (primary) hypertension: Secondary | ICD-10-CM | POA: Diagnosis not present

## 2020-10-31 DIAGNOSIS — Z20822 Contact with and (suspected) exposure to covid-19: Secondary | ICD-10-CM | POA: Diagnosis not present

## 2020-10-31 DIAGNOSIS — E871 Hypo-osmolality and hyponatremia: Secondary | ICD-10-CM | POA: Diagnosis not present

## 2020-10-31 DIAGNOSIS — I493 Ventricular premature depolarization: Secondary | ICD-10-CM | POA: Diagnosis not present

## 2020-10-31 DIAGNOSIS — R443 Hallucinations, unspecified: Secondary | ICD-10-CM | POA: Diagnosis not present

## 2020-10-31 DIAGNOSIS — Z853 Personal history of malignant neoplasm of breast: Secondary | ICD-10-CM | POA: Diagnosis not present

## 2020-10-31 DIAGNOSIS — F29 Unspecified psychosis not due to a substance or known physiological condition: Secondary | ICD-10-CM | POA: Diagnosis not present

## 2020-10-31 DIAGNOSIS — R4701 Aphasia: Secondary | ICD-10-CM | POA: Diagnosis not present

## 2020-10-31 DIAGNOSIS — Z66 Do not resuscitate: Secondary | ICD-10-CM | POA: Diagnosis not present

## 2020-10-31 DIAGNOSIS — R442 Other hallucinations: Secondary | ICD-10-CM | POA: Diagnosis not present

## 2020-10-31 DIAGNOSIS — R404 Transient alteration of awareness: Secondary | ICD-10-CM | POA: Diagnosis not present

## 2020-10-31 DIAGNOSIS — Z7901 Long term (current) use of anticoagulants: Secondary | ICD-10-CM | POA: Diagnosis not present

## 2020-10-31 DIAGNOSIS — E785 Hyperlipidemia, unspecified: Secondary | ICD-10-CM | POA: Diagnosis not present

## 2020-11-02 ENCOUNTER — Other Ambulatory Visit: Payer: Medicare Other

## 2020-11-10 DIAGNOSIS — R300 Dysuria: Secondary | ICD-10-CM | POA: Diagnosis not present

## 2020-11-10 DIAGNOSIS — F331 Major depressive disorder, recurrent, moderate: Secondary | ICD-10-CM | POA: Diagnosis not present

## 2020-11-10 DIAGNOSIS — F064 Anxiety disorder due to known physiological condition: Secondary | ICD-10-CM | POA: Diagnosis not present

## 2020-11-11 ENCOUNTER — Institutional Professional Consult (permissible substitution): Payer: Medicare Other | Admitting: Neurology

## 2020-11-11 DIAGNOSIS — R5381 Other malaise: Secondary | ICD-10-CM | POA: Diagnosis not present

## 2020-11-11 DIAGNOSIS — I10 Essential (primary) hypertension: Secondary | ICD-10-CM | POA: Diagnosis not present

## 2020-11-11 DIAGNOSIS — I619 Nontraumatic intracerebral hemorrhage, unspecified: Secondary | ICD-10-CM | POA: Diagnosis not present

## 2020-11-11 DIAGNOSIS — G9340 Encephalopathy, unspecified: Secondary | ICD-10-CM | POA: Diagnosis not present

## 2020-11-18 DIAGNOSIS — N390 Urinary tract infection, site not specified: Secondary | ICD-10-CM | POA: Diagnosis not present

## 2020-11-24 DIAGNOSIS — F331 Major depressive disorder, recurrent, moderate: Secondary | ICD-10-CM | POA: Diagnosis not present

## 2020-11-24 DIAGNOSIS — F064 Anxiety disorder due to known physiological condition: Secondary | ICD-10-CM | POA: Diagnosis not present

## 2020-11-24 DIAGNOSIS — F5105 Insomnia due to other mental disorder: Secondary | ICD-10-CM | POA: Diagnosis not present

## 2020-11-28 DIAGNOSIS — I119 Hypertensive heart disease without heart failure: Secondary | ICD-10-CM | POA: Diagnosis not present

## 2020-11-28 DIAGNOSIS — H8110 Benign paroxysmal vertigo, unspecified ear: Secondary | ICD-10-CM | POA: Diagnosis not present

## 2020-11-28 DIAGNOSIS — E039 Hypothyroidism, unspecified: Secondary | ICD-10-CM | POA: Diagnosis not present

## 2020-11-28 DIAGNOSIS — M48062 Spinal stenosis, lumbar region with neurogenic claudication: Secondary | ICD-10-CM | POA: Diagnosis not present

## 2020-11-28 DIAGNOSIS — F411 Generalized anxiety disorder: Secondary | ICD-10-CM | POA: Diagnosis not present

## 2020-11-28 DIAGNOSIS — R278 Other lack of coordination: Secondary | ICD-10-CM | POA: Diagnosis not present

## 2020-11-28 DIAGNOSIS — M479 Spondylosis, unspecified: Secondary | ICD-10-CM | POA: Diagnosis not present

## 2020-11-28 DIAGNOSIS — M5416 Radiculopathy, lumbar region: Secondary | ICD-10-CM | POA: Diagnosis not present

## 2020-11-28 DIAGNOSIS — I680 Cerebral amyloid angiopathy: Secondary | ICD-10-CM | POA: Diagnosis not present

## 2020-11-28 DIAGNOSIS — E854 Organ-limited amyloidosis: Secondary | ICD-10-CM | POA: Diagnosis not present

## 2020-11-28 DIAGNOSIS — F32A Depression, unspecified: Secondary | ICD-10-CM | POA: Diagnosis not present

## 2020-11-28 DIAGNOSIS — C50911 Malignant neoplasm of unspecified site of right female breast: Secondary | ICD-10-CM | POA: Diagnosis not present

## 2020-11-28 DIAGNOSIS — R7303 Prediabetes: Secondary | ICD-10-CM | POA: Diagnosis not present

## 2020-11-28 DIAGNOSIS — D630 Anemia in neoplastic disease: Secondary | ICD-10-CM | POA: Diagnosis not present

## 2020-11-28 DIAGNOSIS — I69198 Other sequelae of nontraumatic intracerebral hemorrhage: Secondary | ICD-10-CM | POA: Diagnosis not present

## 2020-11-28 DIAGNOSIS — G8929 Other chronic pain: Secondary | ICD-10-CM | POA: Diagnosis not present

## 2020-12-05 DIAGNOSIS — R300 Dysuria: Secondary | ICD-10-CM | POA: Diagnosis not present

## 2020-12-15 ENCOUNTER — Telehealth: Payer: Self-pay | Admitting: Sports Medicine

## 2020-12-15 NOTE — Telephone Encounter (Signed)
pts son Jeani Hawking) left message stating pt did get the brace and they are saying you told pt not to worry about the bill that she would owe nothing since getting the brace was such a hassle. HE said you told pt you would take care of it. The pts son would like a call back to discusswith you.

## 2020-12-16 ENCOUNTER — Telehealth: Payer: Self-pay | Admitting: Sports Medicine

## 2020-12-16 NOTE — Telephone Encounter (Signed)
pts son Jeani Hawking called again and left message for a call back about the billing. Please call pts son to discuss.

## 2020-12-22 DIAGNOSIS — F064 Anxiety disorder due to known physiological condition: Secondary | ICD-10-CM | POA: Diagnosis not present

## 2020-12-22 DIAGNOSIS — F5105 Insomnia due to other mental disorder: Secondary | ICD-10-CM | POA: Diagnosis not present

## 2020-12-22 DIAGNOSIS — F331 Major depressive disorder, recurrent, moderate: Secondary | ICD-10-CM | POA: Diagnosis not present

## 2020-12-23 DIAGNOSIS — R29898 Other symptoms and signs involving the musculoskeletal system: Secondary | ICD-10-CM | POA: Diagnosis not present

## 2020-12-23 DIAGNOSIS — F0151 Vascular dementia with behavioral disturbance: Secondary | ICD-10-CM | POA: Diagnosis not present
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
## 2020-12-30 DIAGNOSIS — M5416 Radiculopathy, lumbar region: Secondary | ICD-10-CM | POA: Diagnosis not present

## 2020-12-30 DIAGNOSIS — F0151 Vascular dementia with behavioral disturbance: Secondary | ICD-10-CM | POA: Diagnosis not present

## 2020-12-30 DIAGNOSIS — G9340 Encephalopathy, unspecified: Secondary | ICD-10-CM | POA: Diagnosis not present

## 2020-12-30 DIAGNOSIS — H3530 Unspecified macular degeneration: Secondary | ICD-10-CM | POA: Diagnosis not present

## 2020-12-30 DIAGNOSIS — G309 Alzheimer's disease, unspecified: Secondary | ICD-10-CM | POA: Diagnosis not present

## 2020-12-30 DIAGNOSIS — I10 Essential (primary) hypertension: Secondary | ICD-10-CM | POA: Diagnosis not present

## 2020-12-30 DIAGNOSIS — K219 Gastro-esophageal reflux disease without esophagitis: Secondary | ICD-10-CM | POA: Diagnosis not present

## 2020-12-30 DIAGNOSIS — R7303 Prediabetes: Secondary | ICD-10-CM | POA: Diagnosis not present

## 2020-12-30 DIAGNOSIS — I69220 Aphasia following other nontraumatic intracranial hemorrhage: Secondary | ICD-10-CM | POA: Diagnosis not present

## 2020-12-30 DIAGNOSIS — F329 Major depressive disorder, single episode, unspecified: Secondary | ICD-10-CM | POA: Diagnosis not present

## 2020-12-30 DIAGNOSIS — I69251 Hemiplegia and hemiparesis following other nontraumatic intracranial hemorrhage affecting right dominant side: Secondary | ICD-10-CM | POA: Diagnosis not present

## 2020-12-30 DIAGNOSIS — N318 Other neuromuscular dysfunction of bladder: Secondary | ICD-10-CM | POA: Diagnosis not present

## 2020-12-30 DIAGNOSIS — S5292XD Unspecified fracture of left forearm, subsequent encounter for closed fracture with routine healing: Secondary | ICD-10-CM | POA: Diagnosis not present

## 2020-12-30 DIAGNOSIS — F411 Generalized anxiety disorder: Secondary | ICD-10-CM | POA: Diagnosis not present

## 2020-12-30 DIAGNOSIS — F0281 Dementia in other diseases classified elsewhere with behavioral disturbance: Secondary | ICD-10-CM | POA: Diagnosis not present

## 2020-12-30 DIAGNOSIS — I69298 Other sequelae of other nontraumatic intracranial hemorrhage: Secondary | ICD-10-CM | POA: Diagnosis not present

## 2021-01-05 DIAGNOSIS — F331 Major depressive disorder, recurrent, moderate: Secondary | ICD-10-CM | POA: Diagnosis not present

## 2021-01-05 DIAGNOSIS — F064 Anxiety disorder due to known physiological condition: Secondary | ICD-10-CM | POA: Diagnosis not present

## 2021-01-05 DIAGNOSIS — F5105 Insomnia due to other mental disorder: Secondary | ICD-10-CM | POA: Diagnosis not present

## 2021-01-12 DIAGNOSIS — R300 Dysuria: Secondary | ICD-10-CM | POA: Diagnosis not present

## 2021-01-19 DIAGNOSIS — F5105 Insomnia due to other mental disorder: Secondary | ICD-10-CM | POA: Diagnosis not present

## 2021-01-19 DIAGNOSIS — F064 Anxiety disorder due to known physiological condition: Secondary | ICD-10-CM | POA: Diagnosis not present

## 2021-01-19 DIAGNOSIS — F331 Major depressive disorder, recurrent, moderate: Secondary | ICD-10-CM | POA: Diagnosis not present

## 2021-01-20 DIAGNOSIS — N390 Urinary tract infection, site not specified: Secondary | ICD-10-CM | POA: Diagnosis not present

## 2021-01-20 DIAGNOSIS — R29898 Other symptoms and signs involving the musculoskeletal system: Secondary | ICD-10-CM | POA: Diagnosis not present

## 2021-01-20 DIAGNOSIS — F0151 Vascular dementia with behavioral disturbance: Secondary | ICD-10-CM | POA: Diagnosis not present

## 2021-01-26 DIAGNOSIS — I10 Essential (primary) hypertension: Secondary | ICD-10-CM | POA: Diagnosis not present

## 2021-01-26 DIAGNOSIS — E039 Hypothyroidism, unspecified: Secondary | ICD-10-CM | POA: Diagnosis not present

## 2021-01-26 DIAGNOSIS — D649 Anemia, unspecified: Secondary | ICD-10-CM | POA: Diagnosis not present

## 2021-01-29 DIAGNOSIS — F0151 Vascular dementia with behavioral disturbance: Secondary | ICD-10-CM | POA: Diagnosis not present

## 2021-01-29 DIAGNOSIS — I69220 Aphasia following other nontraumatic intracranial hemorrhage: Secondary | ICD-10-CM | POA: Diagnosis not present

## 2021-01-29 DIAGNOSIS — G309 Alzheimer's disease, unspecified: Secondary | ICD-10-CM | POA: Diagnosis not present

## 2021-01-29 DIAGNOSIS — I69251 Hemiplegia and hemiparesis following other nontraumatic intracranial hemorrhage affecting right dominant side: Secondary | ICD-10-CM | POA: Diagnosis not present

## 2021-01-29 DIAGNOSIS — S5292XD Unspecified fracture of left forearm, subsequent encounter for closed fracture with routine healing: Secondary | ICD-10-CM | POA: Diagnosis not present

## 2021-01-29 DIAGNOSIS — M5416 Radiculopathy, lumbar region: Secondary | ICD-10-CM | POA: Diagnosis not present

## 2021-01-29 DIAGNOSIS — F411 Generalized anxiety disorder: Secondary | ICD-10-CM | POA: Diagnosis not present

## 2021-01-29 DIAGNOSIS — H3530 Unspecified macular degeneration: Secondary | ICD-10-CM | POA: Diagnosis not present

## 2021-01-29 DIAGNOSIS — G9340 Encephalopathy, unspecified: Secondary | ICD-10-CM | POA: Diagnosis not present

## 2021-01-29 DIAGNOSIS — K219 Gastro-esophageal reflux disease without esophagitis: Secondary | ICD-10-CM | POA: Diagnosis not present

## 2021-01-29 DIAGNOSIS — I69298 Other sequelae of other nontraumatic intracranial hemorrhage: Secondary | ICD-10-CM | POA: Diagnosis not present

## 2021-01-29 DIAGNOSIS — R7303 Prediabetes: Secondary | ICD-10-CM | POA: Diagnosis not present

## 2021-01-29 DIAGNOSIS — F329 Major depressive disorder, single episode, unspecified: Secondary | ICD-10-CM | POA: Diagnosis not present

## 2021-01-29 DIAGNOSIS — F0281 Dementia in other diseases classified elsewhere with behavioral disturbance: Secondary | ICD-10-CM | POA: Diagnosis not present

## 2021-01-29 DIAGNOSIS — N318 Other neuromuscular dysfunction of bladder: Secondary | ICD-10-CM | POA: Diagnosis not present

## 2021-01-29 DIAGNOSIS — I10 Essential (primary) hypertension: Secondary | ICD-10-CM | POA: Diagnosis not present

## 2021-02-12 DIAGNOSIS — H524 Presbyopia: Secondary | ICD-10-CM | POA: Diagnosis not present

## 2021-02-25 DIAGNOSIS — D649 Anemia, unspecified: Secondary | ICD-10-CM | POA: Diagnosis not present

## 2021-02-25 DIAGNOSIS — I10 Essential (primary) hypertension: Secondary | ICD-10-CM | POA: Diagnosis not present

## 2021-03-02 DIAGNOSIS — F5105 Insomnia due to other mental disorder: Secondary | ICD-10-CM | POA: Diagnosis not present

## 2021-03-02 DIAGNOSIS — F331 Major depressive disorder, recurrent, moderate: Secondary | ICD-10-CM | POA: Diagnosis not present

## 2021-03-02 DIAGNOSIS — F064 Anxiety disorder due to known physiological condition: Secondary | ICD-10-CM | POA: Diagnosis not present

## 2021-03-05 DIAGNOSIS — I639 Cerebral infarction, unspecified: Secondary | ICD-10-CM | POA: Diagnosis not present

## 2021-03-09 DIAGNOSIS — I471 Supraventricular tachycardia: Secondary | ICD-10-CM | POA: Diagnosis not present

## 2021-03-16 DIAGNOSIS — F064 Anxiety disorder due to known physiological condition: Secondary | ICD-10-CM | POA: Diagnosis not present

## 2021-03-16 DIAGNOSIS — F5105 Insomnia due to other mental disorder: Secondary | ICD-10-CM | POA: Diagnosis not present

## 2021-03-16 DIAGNOSIS — F331 Major depressive disorder, recurrent, moderate: Secondary | ICD-10-CM | POA: Diagnosis not present

## 2021-03-17 DIAGNOSIS — L309 Dermatitis, unspecified: Secondary | ICD-10-CM | POA: Diagnosis not present

## 2021-03-17 DIAGNOSIS — L853 Xerosis cutis: Secondary | ICD-10-CM | POA: Diagnosis not present

## 2021-04-26 ENCOUNTER — Ambulatory Visit: Payer: Medicare Other | Admitting: Neurology

## 2021-05-20 DIAGNOSIS — Z8673 Personal history of transient ischemic attack (TIA), and cerebral infarction without residual deficits: Secondary | ICD-10-CM | POA: Diagnosis not present

## 2021-05-20 DIAGNOSIS — I471 Supraventricular tachycardia: Secondary | ICD-10-CM | POA: Diagnosis not present

## 2021-05-20 DIAGNOSIS — I6523 Occlusion and stenosis of bilateral carotid arteries: Secondary | ICD-10-CM | POA: Diagnosis not present

## 2021-05-20 DIAGNOSIS — I10 Essential (primary) hypertension: Secondary | ICD-10-CM | POA: Diagnosis not present

## 2021-05-21 DIAGNOSIS — I493 Ventricular premature depolarization: Secondary | ICD-10-CM | POA: Diagnosis not present

## 2021-07-14 DIAGNOSIS — F32A Depression, unspecified: Secondary | ICD-10-CM | POA: Diagnosis not present

## 2021-07-14 DIAGNOSIS — I10 Essential (primary) hypertension: Secondary | ICD-10-CM | POA: Diagnosis not present

## 2021-07-14 DIAGNOSIS — R609 Edema, unspecified: Secondary | ICD-10-CM | POA: Diagnosis not present

## 2021-07-21 DIAGNOSIS — R2681 Unsteadiness on feet: Secondary | ICD-10-CM | POA: Diagnosis not present

## 2021-07-21 DIAGNOSIS — Z8673 Personal history of transient ischemic attack (TIA), and cerebral infarction without residual deficits: Secondary | ICD-10-CM | POA: Diagnosis not present

## 2021-07-21 DIAGNOSIS — I10 Essential (primary) hypertension: Secondary | ICD-10-CM | POA: Diagnosis not present

## 2021-07-21 DIAGNOSIS — R609 Edema, unspecified: Secondary | ICD-10-CM | POA: Diagnosis not present

## 2021-07-23 DIAGNOSIS — N318 Other neuromuscular dysfunction of bladder: Secondary | ICD-10-CM | POA: Diagnosis not present

## 2021-07-23 DIAGNOSIS — F411 Generalized anxiety disorder: Secondary | ICD-10-CM | POA: Diagnosis not present

## 2021-07-23 DIAGNOSIS — R609 Edema, unspecified: Secondary | ICD-10-CM | POA: Diagnosis not present

## 2021-07-23 DIAGNOSIS — M170 Bilateral primary osteoarthritis of knee: Secondary | ICD-10-CM | POA: Diagnosis not present

## 2021-07-23 DIAGNOSIS — C50911 Malignant neoplasm of unspecified site of right female breast: Secondary | ICD-10-CM | POA: Diagnosis not present

## 2021-07-23 DIAGNOSIS — R4701 Aphasia: Secondary | ICD-10-CM | POA: Diagnosis not present

## 2021-07-23 DIAGNOSIS — I10 Essential (primary) hypertension: Secondary | ICD-10-CM | POA: Diagnosis not present

## 2021-07-23 DIAGNOSIS — R42 Dizziness and giddiness: Secondary | ICD-10-CM | POA: Diagnosis not present

## 2021-07-23 DIAGNOSIS — I69198 Other sequelae of nontraumatic intracerebral hemorrhage: Secondary | ICD-10-CM | POA: Diagnosis not present

## 2021-07-23 DIAGNOSIS — R7303 Prediabetes: Secondary | ICD-10-CM | POA: Diagnosis not present

## 2021-07-23 DIAGNOSIS — M5416 Radiculopathy, lumbar region: Secondary | ICD-10-CM | POA: Diagnosis not present

## 2021-07-23 DIAGNOSIS — G9340 Encephalopathy, unspecified: Secondary | ICD-10-CM | POA: Diagnosis not present

## 2021-07-23 DIAGNOSIS — E039 Hypothyroidism, unspecified: Secondary | ICD-10-CM | POA: Diagnosis not present

## 2021-07-23 DIAGNOSIS — M48062 Spinal stenosis, lumbar region with neurogenic claudication: Secondary | ICD-10-CM | POA: Diagnosis not present

## 2021-07-23 DIAGNOSIS — R2689 Other abnormalities of gait and mobility: Secondary | ICD-10-CM | POA: Diagnosis not present

## 2021-07-23 DIAGNOSIS — F319 Bipolar disorder, unspecified: Secondary | ICD-10-CM | POA: Diagnosis not present

## 2021-08-03 DIAGNOSIS — E039 Hypothyroidism, unspecified: Secondary | ICD-10-CM | POA: Diagnosis not present

## 2021-08-03 DIAGNOSIS — I10 Essential (primary) hypertension: Secondary | ICD-10-CM | POA: Diagnosis not present

## 2021-08-03 DIAGNOSIS — E785 Hyperlipidemia, unspecified: Secondary | ICD-10-CM | POA: Diagnosis not present

## 2021-08-04 DIAGNOSIS — R42 Dizziness and giddiness: Secondary | ICD-10-CM | POA: Diagnosis not present

## 2021-08-04 DIAGNOSIS — E039 Hypothyroidism, unspecified: Secondary | ICD-10-CM | POA: Diagnosis not present

## 2021-08-04 DIAGNOSIS — I10 Essential (primary) hypertension: Secondary | ICD-10-CM | POA: Diagnosis not present

## 2021-08-04 DIAGNOSIS — Z8673 Personal history of transient ischemic attack (TIA), and cerebral infarction without residual deficits: Secondary | ICD-10-CM | POA: Diagnosis not present

## 2021-08-05 DIAGNOSIS — I10 Essential (primary) hypertension: Secondary | ICD-10-CM | POA: Diagnosis not present

## 2021-08-05 DIAGNOSIS — E568 Deficiency of other vitamins: Secondary | ICD-10-CM | POA: Diagnosis not present

## 2021-08-05 DIAGNOSIS — E039 Hypothyroidism, unspecified: Secondary | ICD-10-CM | POA: Diagnosis not present

## 2021-08-05 DIAGNOSIS — E785 Hyperlipidemia, unspecified: Secondary | ICD-10-CM | POA: Diagnosis not present

## 2021-08-19 DIAGNOSIS — U071 COVID-19: Secondary | ICD-10-CM | POA: Diagnosis not present

## 2021-08-19 DIAGNOSIS — R42 Dizziness and giddiness: Secondary | ICD-10-CM | POA: Diagnosis not present

## 2021-08-22 DIAGNOSIS — U071 COVID-19: Secondary | ICD-10-CM | POA: Diagnosis not present

## 2021-08-22 DIAGNOSIS — R8271 Bacteriuria: Secondary | ICD-10-CM | POA: Diagnosis not present

## 2021-08-22 DIAGNOSIS — R638 Other symptoms and signs concerning food and fluid intake: Secondary | ICD-10-CM | POA: Diagnosis not present

## 2021-08-22 DIAGNOSIS — R42 Dizziness and giddiness: Secondary | ICD-10-CM | POA: Diagnosis not present

## 2021-08-22 DIAGNOSIS — E876 Hypokalemia: Secondary | ICD-10-CM | POA: Diagnosis not present

## 2021-08-22 DIAGNOSIS — E871 Hypo-osmolality and hyponatremia: Secondary | ICD-10-CM | POA: Diagnosis not present

## 2021-08-23 DIAGNOSIS — R638 Other symptoms and signs concerning food and fluid intake: Secondary | ICD-10-CM | POA: Diagnosis not present

## 2021-08-23 DIAGNOSIS — E876 Hypokalemia: Secondary | ICD-10-CM | POA: Diagnosis not present

## 2021-08-23 DIAGNOSIS — R9431 Abnormal electrocardiogram [ECG] [EKG]: Secondary | ICD-10-CM | POA: Diagnosis not present

## 2021-08-23 DIAGNOSIS — E871 Hypo-osmolality and hyponatremia: Secondary | ICD-10-CM | POA: Diagnosis not present

## 2021-08-23 DIAGNOSIS — R8271 Bacteriuria: Secondary | ICD-10-CM | POA: Diagnosis not present

## 2021-08-24 DIAGNOSIS — N3090 Cystitis, unspecified without hematuria: Secondary | ICD-10-CM | POA: Diagnosis not present

## 2021-08-24 DIAGNOSIS — E876 Hypokalemia: Secondary | ICD-10-CM | POA: Diagnosis not present

## 2021-08-24 DIAGNOSIS — E871 Hypo-osmolality and hyponatremia: Secondary | ICD-10-CM | POA: Diagnosis not present

## 2021-08-24 DIAGNOSIS — R638 Other symptoms and signs concerning food and fluid intake: Secondary | ICD-10-CM | POA: Diagnosis not present

## 2021-08-25 DIAGNOSIS — R7303 Prediabetes: Secondary | ICD-10-CM | POA: Diagnosis not present

## 2021-08-25 DIAGNOSIS — R609 Edema, unspecified: Secondary | ICD-10-CM | POA: Diagnosis not present

## 2021-08-25 DIAGNOSIS — Z8673 Personal history of transient ischemic attack (TIA), and cerebral infarction without residual deficits: Secondary | ICD-10-CM | POA: Diagnosis not present

## 2021-08-25 DIAGNOSIS — E039 Hypothyroidism, unspecified: Secondary | ICD-10-CM | POA: Diagnosis not present

## 2021-08-25 DIAGNOSIS — R4701 Aphasia: Secondary | ICD-10-CM | POA: Diagnosis not present

## 2021-08-25 DIAGNOSIS — F411 Generalized anxiety disorder: Secondary | ICD-10-CM | POA: Diagnosis not present

## 2021-08-25 DIAGNOSIS — R2689 Other abnormalities of gait and mobility: Secondary | ICD-10-CM | POA: Diagnosis not present

## 2021-08-25 DIAGNOSIS — E871 Hypo-osmolality and hyponatremia: Secondary | ICD-10-CM | POA: Diagnosis not present

## 2021-08-25 DIAGNOSIS — M170 Bilateral primary osteoarthritis of knee: Secondary | ICD-10-CM | POA: Diagnosis not present

## 2021-08-25 DIAGNOSIS — N318 Other neuromuscular dysfunction of bladder: Secondary | ICD-10-CM | POA: Diagnosis not present

## 2021-08-25 DIAGNOSIS — R42 Dizziness and giddiness: Secondary | ICD-10-CM | POA: Diagnosis not present

## 2021-08-25 DIAGNOSIS — I69198 Other sequelae of nontraumatic intracerebral hemorrhage: Secondary | ICD-10-CM | POA: Diagnosis not present

## 2021-08-25 DIAGNOSIS — I10 Essential (primary) hypertension: Secondary | ICD-10-CM | POA: Diagnosis not present

## 2021-08-25 DIAGNOSIS — M5416 Radiculopathy, lumbar region: Secondary | ICD-10-CM | POA: Diagnosis not present

## 2021-08-25 DIAGNOSIS — F319 Bipolar disorder, unspecified: Secondary | ICD-10-CM | POA: Diagnosis not present

## 2021-08-25 DIAGNOSIS — M48062 Spinal stenosis, lumbar region with neurogenic claudication: Secondary | ICD-10-CM | POA: Diagnosis not present

## 2021-08-25 DIAGNOSIS — C50911 Malignant neoplasm of unspecified site of right female breast: Secondary | ICD-10-CM | POA: Diagnosis not present

## 2021-08-25 DIAGNOSIS — G9340 Encephalopathy, unspecified: Secondary | ICD-10-CM | POA: Diagnosis not present

## 2021-08-25 DIAGNOSIS — N390 Urinary tract infection, site not specified: Secondary | ICD-10-CM | POA: Diagnosis not present

## 2021-08-31 DIAGNOSIS — E871 Hypo-osmolality and hyponatremia: Secondary | ICD-10-CM | POA: Diagnosis not present

## 2021-09-01 DIAGNOSIS — Z8673 Personal history of transient ischemic attack (TIA), and cerebral infarction without residual deficits: Secondary | ICD-10-CM | POA: Diagnosis not present

## 2021-09-01 DIAGNOSIS — R42 Dizziness and giddiness: Secondary | ICD-10-CM | POA: Diagnosis not present

## 2021-09-06 DIAGNOSIS — N390 Urinary tract infection, site not specified: Secondary | ICD-10-CM | POA: Diagnosis not present

## 2021-09-08 DIAGNOSIS — R42 Dizziness and giddiness: Secondary | ICD-10-CM | POA: Diagnosis not present

## 2021-09-08 DIAGNOSIS — Z8673 Personal history of transient ischemic attack (TIA), and cerebral infarction without residual deficits: Secondary | ICD-10-CM | POA: Diagnosis not present

## 2021-09-15 DIAGNOSIS — R42 Dizziness and giddiness: Secondary | ICD-10-CM | POA: Diagnosis not present

## 2021-09-28 DIAGNOSIS — E039 Hypothyroidism, unspecified: Secondary | ICD-10-CM | POA: Diagnosis not present

## 2021-11-17 DIAGNOSIS — E039 Hypothyroidism, unspecified: Secondary | ICD-10-CM | POA: Diagnosis not present

## 2021-11-17 DIAGNOSIS — R42 Dizziness and giddiness: Secondary | ICD-10-CM | POA: Diagnosis not present

## 2021-11-17 DIAGNOSIS — I6990 Unspecified sequelae of unspecified cerebrovascular disease: Secondary | ICD-10-CM | POA: Diagnosis not present

## 2021-11-17 DIAGNOSIS — I10 Essential (primary) hypertension: Secondary | ICD-10-CM | POA: Diagnosis not present

## 2021-11-18 DIAGNOSIS — F411 Generalized anxiety disorder: Secondary | ICD-10-CM | POA: Diagnosis not present

## 2021-11-18 DIAGNOSIS — I69318 Other symptoms and signs involving cognitive functions following cerebral infarction: Secondary | ICD-10-CM | POA: Diagnosis not present

## 2021-11-18 DIAGNOSIS — I69398 Other sequelae of cerebral infarction: Secondary | ICD-10-CM | POA: Diagnosis not present

## 2021-11-18 DIAGNOSIS — Z853 Personal history of malignant neoplasm of breast: Secondary | ICD-10-CM | POA: Diagnosis not present

## 2021-11-18 DIAGNOSIS — F319 Bipolar disorder, unspecified: Secondary | ICD-10-CM | POA: Diagnosis not present

## 2021-11-18 DIAGNOSIS — I69320 Aphasia following cerebral infarction: Secondary | ICD-10-CM | POA: Diagnosis not present

## 2021-11-18 DIAGNOSIS — Z87440 Personal history of urinary (tract) infections: Secondary | ICD-10-CM | POA: Diagnosis not present

## 2021-11-18 DIAGNOSIS — H8110 Benign paroxysmal vertigo, unspecified ear: Secondary | ICD-10-CM | POA: Diagnosis not present

## 2021-11-18 DIAGNOSIS — R7303 Prediabetes: Secondary | ICD-10-CM | POA: Diagnosis not present

## 2021-11-18 DIAGNOSIS — N318 Other neuromuscular dysfunction of bladder: Secondary | ICD-10-CM | POA: Diagnosis not present

## 2021-11-18 DIAGNOSIS — E039 Hypothyroidism, unspecified: Secondary | ICD-10-CM | POA: Diagnosis not present

## 2021-11-18 DIAGNOSIS — M5416 Radiculopathy, lumbar region: Secondary | ICD-10-CM | POA: Diagnosis not present

## 2021-11-18 DIAGNOSIS — M170 Bilateral primary osteoarthritis of knee: Secondary | ICD-10-CM | POA: Diagnosis not present

## 2021-11-18 DIAGNOSIS — F01A18 Vascular dementia, mild, with other behavioral disturbance: Secondary | ICD-10-CM | POA: Diagnosis not present

## 2021-11-18 DIAGNOSIS — R29898 Other symptoms and signs involving the musculoskeletal system: Secondary | ICD-10-CM | POA: Diagnosis not present

## 2021-11-18 DIAGNOSIS — I10 Essential (primary) hypertension: Secondary | ICD-10-CM | POA: Diagnosis not present

## 2021-12-22 DIAGNOSIS — F411 Generalized anxiety disorder: Secondary | ICD-10-CM | POA: Diagnosis not present

## 2021-12-22 DIAGNOSIS — E039 Hypothyroidism, unspecified: Secondary | ICD-10-CM | POA: Diagnosis not present

## 2021-12-22 DIAGNOSIS — F319 Bipolar disorder, unspecified: Secondary | ICD-10-CM | POA: Diagnosis not present

## 2021-12-22 DIAGNOSIS — M5416 Radiculopathy, lumbar region: Secondary | ICD-10-CM | POA: Diagnosis not present

## 2021-12-22 DIAGNOSIS — F01A18 Vascular dementia, mild, with other behavioral disturbance: Secondary | ICD-10-CM | POA: Diagnosis not present

## 2021-12-22 DIAGNOSIS — R29898 Other symptoms and signs involving the musculoskeletal system: Secondary | ICD-10-CM | POA: Diagnosis not present

## 2021-12-22 DIAGNOSIS — N318 Other neuromuscular dysfunction of bladder: Secondary | ICD-10-CM | POA: Diagnosis not present

## 2021-12-22 DIAGNOSIS — I69320 Aphasia following cerebral infarction: Secondary | ICD-10-CM | POA: Diagnosis not present

## 2021-12-22 DIAGNOSIS — M170 Bilateral primary osteoarthritis of knee: Secondary | ICD-10-CM | POA: Diagnosis not present

## 2021-12-22 DIAGNOSIS — I10 Essential (primary) hypertension: Secondary | ICD-10-CM | POA: Diagnosis not present

## 2021-12-22 DIAGNOSIS — I69318 Other symptoms and signs involving cognitive functions following cerebral infarction: Secondary | ICD-10-CM | POA: Diagnosis not present

## 2021-12-22 DIAGNOSIS — R7303 Prediabetes: Secondary | ICD-10-CM | POA: Diagnosis not present

## 2021-12-22 DIAGNOSIS — H8110 Benign paroxysmal vertigo, unspecified ear: Secondary | ICD-10-CM | POA: Diagnosis not present

## 2021-12-22 DIAGNOSIS — I69398 Other sequelae of cerebral infarction: Secondary | ICD-10-CM | POA: Diagnosis not present

## 2021-12-22 DIAGNOSIS — Z87440 Personal history of urinary (tract) infections: Secondary | ICD-10-CM | POA: Diagnosis not present

## 2021-12-22 DIAGNOSIS — Z853 Personal history of malignant neoplasm of breast: Secondary | ICD-10-CM | POA: Diagnosis not present

## 2022-01-12 DIAGNOSIS — R42 Dizziness and giddiness: Secondary | ICD-10-CM | POA: Diagnosis not present

## 2022-02-01 DIAGNOSIS — I10 Essential (primary) hypertension: Secondary | ICD-10-CM | POA: Diagnosis not present

## 2022-02-02 DIAGNOSIS — E039 Hypothyroidism, unspecified: Secondary | ICD-10-CM | POA: Diagnosis not present

## 2022-02-02 DIAGNOSIS — Z8673 Personal history of transient ischemic attack (TIA), and cerebral infarction without residual deficits: Secondary | ICD-10-CM | POA: Diagnosis not present

## 2022-02-02 DIAGNOSIS — Z298 Encounter for other specified prophylactic measures: Secondary | ICD-10-CM | POA: Diagnosis not present

## 2022-02-02 DIAGNOSIS — I10 Essential (primary) hypertension: Secondary | ICD-10-CM | POA: Diagnosis not present

## 2022-02-10 IMAGING — CT CT HEAD CODE STROKE
4 series · 16 of 47 positions shown, 18 images · non-contrast
Comparison: Previous MRI from 06/15/2018.

CLINICAL DATA: Code stroke. Initial evaluation for acute expressive
aphasia.

EXAM:
CT HEAD WITHOUT CONTRAST
TECHNIQUE: Contiguous axial images were obtained from the base of the skull
through the vertex without intravenous contrast.

[Series 3: head wo · axial · 0.39mm/px · z∈[-182,-62]mm · 7 of 34 slices shown, 9 images]
[im 5/34  brain]
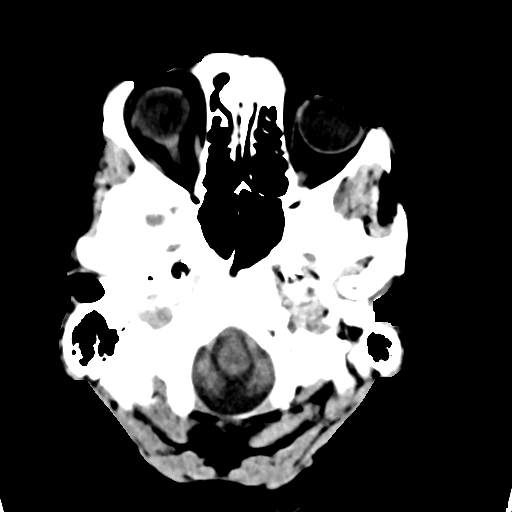
[im 5/34  bone]
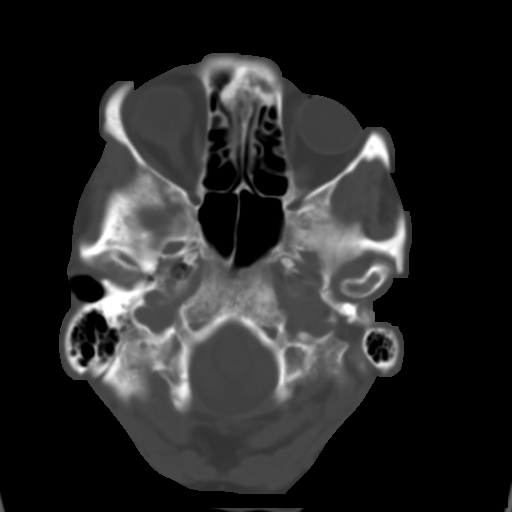
[im 9/34  brain]
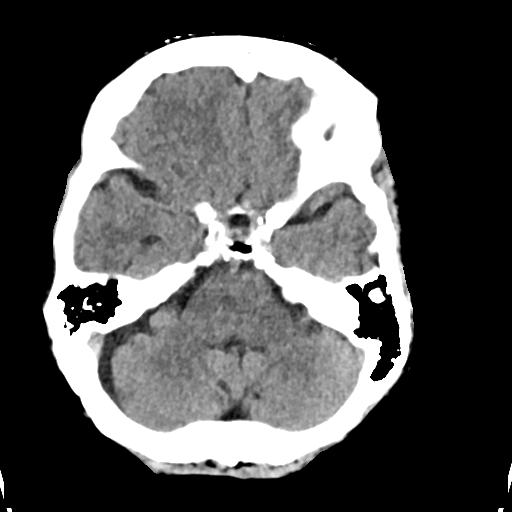
[im 13/34  brain]
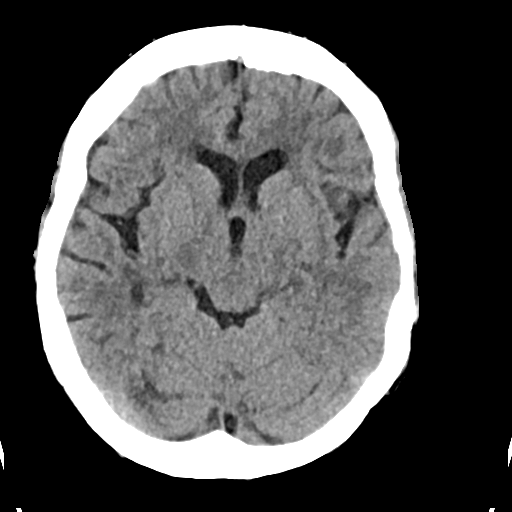
[im 17/34  brain]
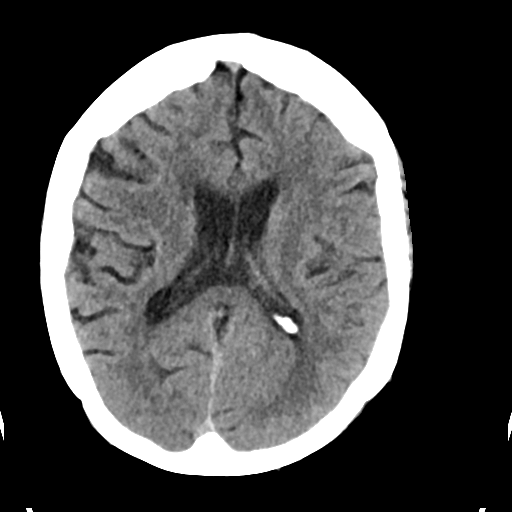
[im 21/34  brain]
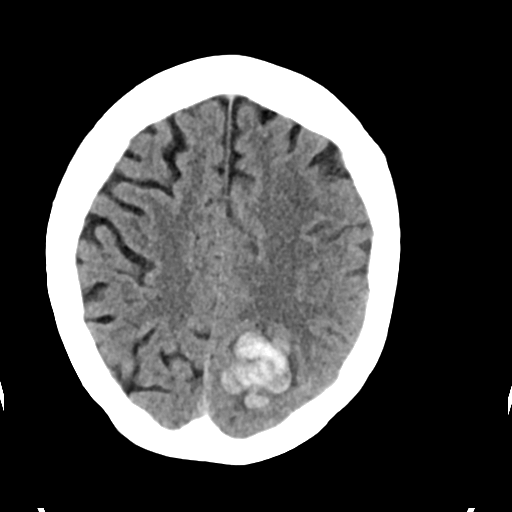
[im 21/34  bone]
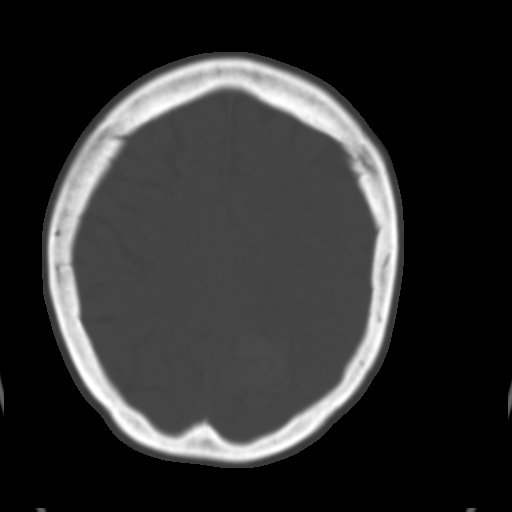
[im 25/34  brain]
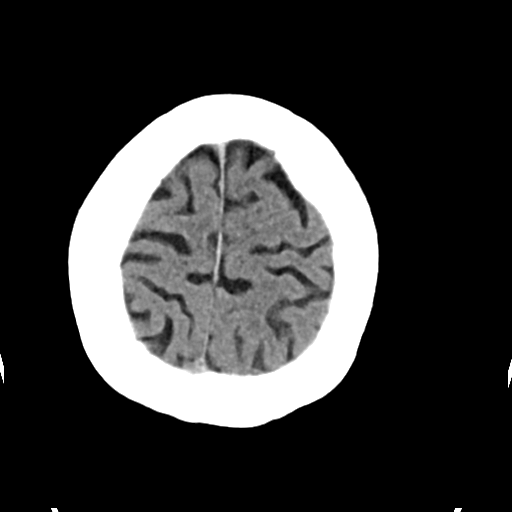
[im 29/34  brain]
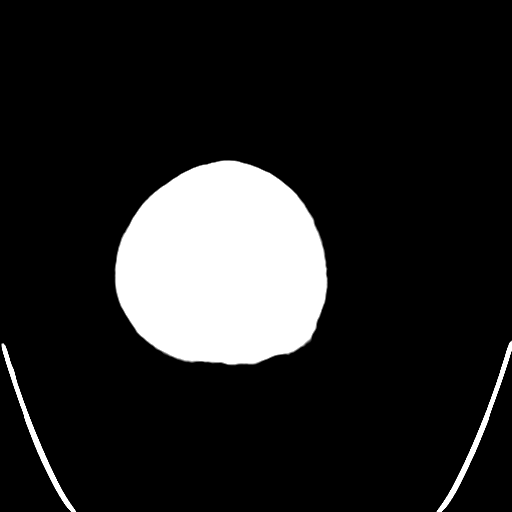

[Series 4: head bone · axial · 0.39mm/px · z∈[-186,-152]mm · 3 of 85 slices shown]
[im 9/85  bone]
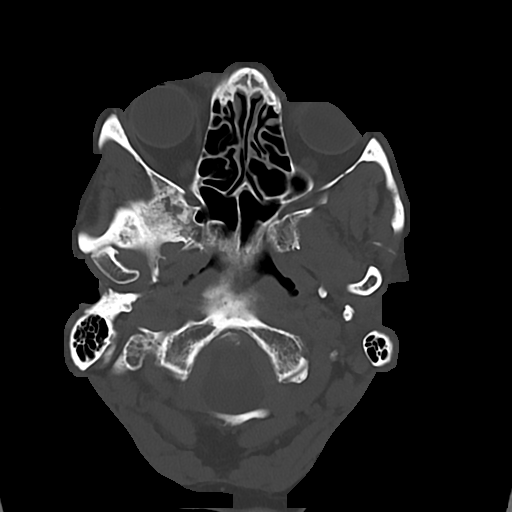
[im 17/85  bone]
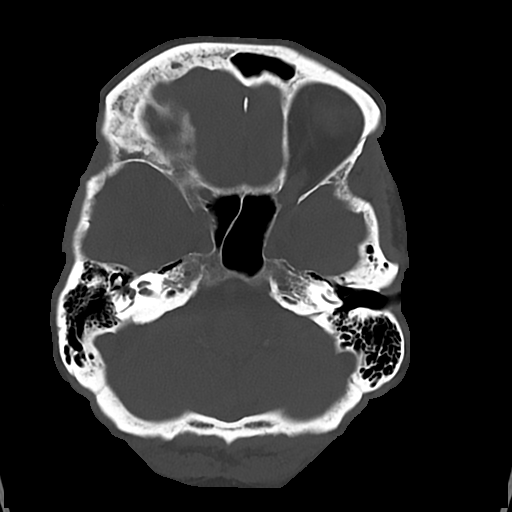
[im 26/85  bone]
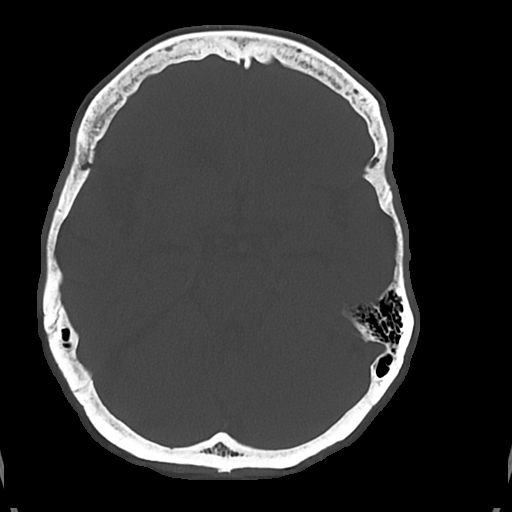

[Series 5: cor soft · coronal · 0.34mm/px · 3 of 72 slices shown]
[im 24/72  brain]
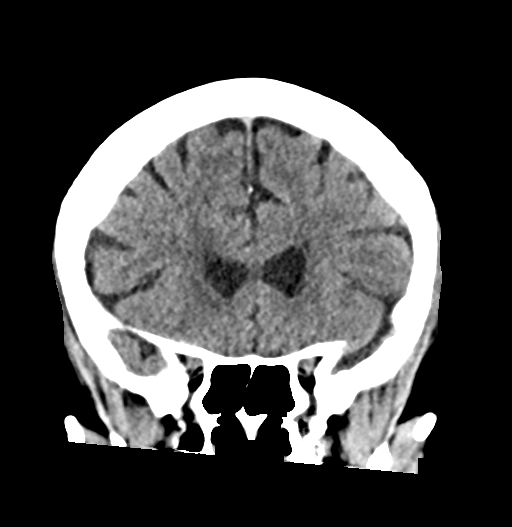
[im 32/72  brain]
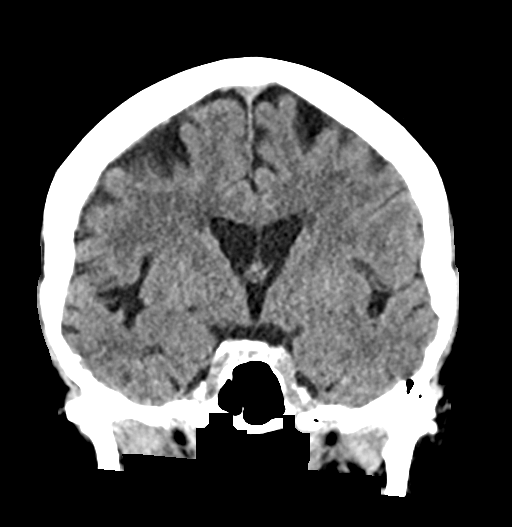
[im 40/72  brain]
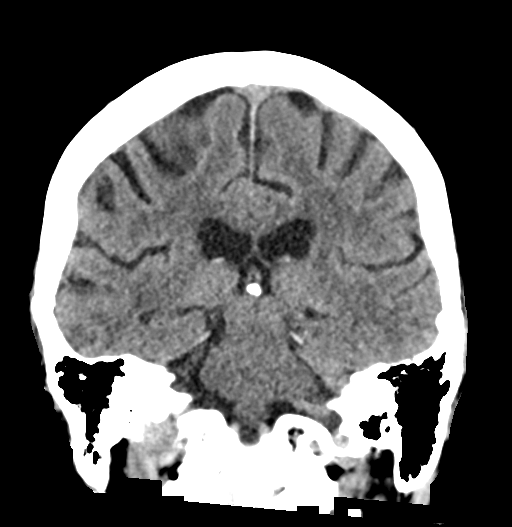

[Series 6: sag soft · sagittal · 0.45mm/px · 3 of 57 slices shown]
[im 19/57  brain]
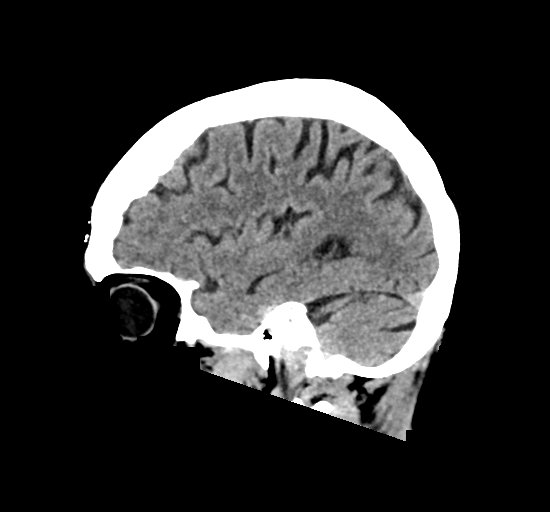
[im 29/57  brain]
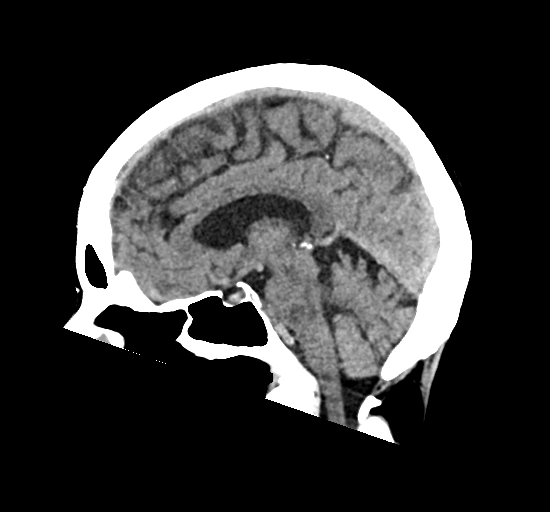
[im 38/57  brain]
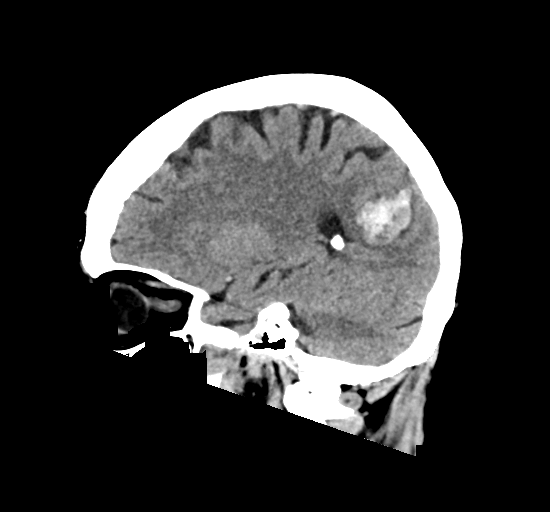

[16 of 47 positions shown; findings below may reference images not displayed]

FINDINGS: Brain: Generalized age-related cerebral atrophy with mild chronic
small vessel ischemic disease.

There is an acute intraparenchymal hemorrhage centered at the right
parieto-occipital region measuring 3.0 x 2.8 x 3.0 cm (estimated
volume 13 cc). Minimal surrounding edema without significant
regional mass effect at this time. No intraventricular for subdural
extension. No other acute intracranial hemorrhage.

Gray-white matter differentiation otherwise maintained. No other
large vessel territory infarct. No mass lesion, midline shift, or
hydrocephalus. No extra-axial fluid collection.

Vascular: No hyperdense vessel. Scattered vascular calcifications
noted within the carotid siphons.

Skull: Scalp soft tissues within normal limits. No evidence for
acute traumatic injury. Calvarium intact.

Sinuses/Orbits: Globes and orbital soft tissues demonstrate no acute
finding. Mild scattered mucoperiosteal thickening noted within the
ethmoidal air cells. Paranasal sinuses are otherwise clear. No
mastoid effusion.

Other: None.

ASPECTS ([place] Stroke Program Early CT Score)

Positive intracranial hemorrhage, does not apply.
IMPRESSION: 1. Acute intraparenchymal hemorrhage centered at the right
parieto-occipital region, estimated volume 13 cc. Minimal
surrounding edema without significant regional mass effect.
2. Underlying age-related cerebral atrophy with mild chronic small
vessel ischemic disease.

These results were communicated to Dr. Omega at [DATE] pmon
08/15/2020by text page via the AMION messaging system.

## 2022-04-12 DIAGNOSIS — Z7901 Long term (current) use of anticoagulants: Secondary | ICD-10-CM | POA: Diagnosis not present

## 2022-05-18 DIAGNOSIS — E039 Hypothyroidism, unspecified: Secondary | ICD-10-CM | POA: Diagnosis not present

## 2022-05-18 DIAGNOSIS — R42 Dizziness and giddiness: Secondary | ICD-10-CM | POA: Diagnosis not present

## 2022-05-18 DIAGNOSIS — I10 Essential (primary) hypertension: Secondary | ICD-10-CM | POA: Diagnosis not present

## 2022-05-18 DIAGNOSIS — R609 Edema, unspecified: Secondary | ICD-10-CM | POA: Diagnosis not present

## 2022-05-20 DIAGNOSIS — M5136 Other intervertebral disc degeneration, lumbar region: Secondary | ICD-10-CM | POA: Diagnosis not present

## 2022-05-20 DIAGNOSIS — Z981 Arthrodesis status: Secondary | ICD-10-CM | POA: Diagnosis not present

## 2022-05-20 DIAGNOSIS — M47816 Spondylosis without myelopathy or radiculopathy, lumbar region: Secondary | ICD-10-CM | POA: Diagnosis not present

## 2022-05-25 DIAGNOSIS — M545 Low back pain, unspecified: Secondary | ICD-10-CM | POA: Diagnosis not present

## 2022-05-30 DIAGNOSIS — M170 Bilateral primary osteoarthritis of knee: Secondary | ICD-10-CM | POA: Diagnosis not present

## 2022-05-30 DIAGNOSIS — Z853 Personal history of malignant neoplasm of breast: Secondary | ICD-10-CM | POA: Diagnosis not present

## 2022-05-30 DIAGNOSIS — Z87440 Personal history of urinary (tract) infections: Secondary | ICD-10-CM | POA: Diagnosis not present

## 2022-05-30 DIAGNOSIS — F3342 Major depressive disorder, recurrent, in full remission: Secondary | ICD-10-CM | POA: Diagnosis not present

## 2022-05-30 DIAGNOSIS — Z9181 History of falling: Secondary | ICD-10-CM | POA: Diagnosis not present

## 2022-05-30 DIAGNOSIS — I10 Essential (primary) hypertension: Secondary | ICD-10-CM | POA: Diagnosis not present

## 2022-05-30 DIAGNOSIS — I69398 Other sequelae of cerebral infarction: Secondary | ICD-10-CM | POA: Diagnosis not present

## 2022-05-30 DIAGNOSIS — F411 Generalized anxiety disorder: Secondary | ICD-10-CM | POA: Diagnosis not present

## 2022-05-30 DIAGNOSIS — R7303 Prediabetes: Secondary | ICD-10-CM | POA: Diagnosis not present

## 2022-05-30 DIAGNOSIS — F01A18 Vascular dementia, mild, with other behavioral disturbance: Secondary | ICD-10-CM | POA: Diagnosis not present

## 2022-05-30 DIAGNOSIS — M5416 Radiculopathy, lumbar region: Secondary | ICD-10-CM | POA: Diagnosis not present

## 2022-05-30 DIAGNOSIS — I69318 Other symptoms and signs involving cognitive functions following cerebral infarction: Secondary | ICD-10-CM | POA: Diagnosis not present

## 2022-05-30 DIAGNOSIS — E039 Hypothyroidism, unspecified: Secondary | ICD-10-CM | POA: Diagnosis not present

## 2022-05-30 DIAGNOSIS — R2689 Other abnormalities of gait and mobility: Secondary | ICD-10-CM | POA: Diagnosis not present

## 2022-07-05 DIAGNOSIS — I69398 Other sequelae of cerebral infarction: Secondary | ICD-10-CM | POA: Diagnosis not present

## 2022-07-05 DIAGNOSIS — F411 Generalized anxiety disorder: Secondary | ICD-10-CM | POA: Diagnosis not present

## 2022-07-05 DIAGNOSIS — M5416 Radiculopathy, lumbar region: Secondary | ICD-10-CM | POA: Diagnosis not present

## 2022-07-05 DIAGNOSIS — Z87440 Personal history of urinary (tract) infections: Secondary | ICD-10-CM | POA: Diagnosis not present

## 2022-07-05 DIAGNOSIS — Z9181 History of falling: Secondary | ICD-10-CM | POA: Diagnosis not present

## 2022-07-05 DIAGNOSIS — I69318 Other symptoms and signs involving cognitive functions following cerebral infarction: Secondary | ICD-10-CM | POA: Diagnosis not present

## 2022-07-05 DIAGNOSIS — R7303 Prediabetes: Secondary | ICD-10-CM | POA: Diagnosis not present

## 2022-07-05 DIAGNOSIS — I10 Essential (primary) hypertension: Secondary | ICD-10-CM | POA: Diagnosis not present

## 2022-07-05 DIAGNOSIS — E039 Hypothyroidism, unspecified: Secondary | ICD-10-CM | POA: Diagnosis not present

## 2022-07-05 DIAGNOSIS — R2689 Other abnormalities of gait and mobility: Secondary | ICD-10-CM | POA: Diagnosis not present

## 2022-07-05 DIAGNOSIS — F01A18 Vascular dementia, mild, with other behavioral disturbance: Secondary | ICD-10-CM | POA: Diagnosis not present

## 2022-07-05 DIAGNOSIS — F3342 Major depressive disorder, recurrent, in full remission: Secondary | ICD-10-CM | POA: Diagnosis not present

## 2022-07-05 DIAGNOSIS — Z853 Personal history of malignant neoplasm of breast: Secondary | ICD-10-CM | POA: Diagnosis not present

## 2022-07-05 DIAGNOSIS — M170 Bilateral primary osteoarthritis of knee: Secondary | ICD-10-CM | POA: Diagnosis not present

## 2022-08-02 DIAGNOSIS — E039 Hypothyroidism, unspecified: Secondary | ICD-10-CM | POA: Diagnosis not present

## 2022-08-02 DIAGNOSIS — I10 Essential (primary) hypertension: Secondary | ICD-10-CM | POA: Diagnosis not present

## 2022-08-02 DIAGNOSIS — E785 Hyperlipidemia, unspecified: Secondary | ICD-10-CM | POA: Diagnosis not present

## 2022-08-31 DIAGNOSIS — R300 Dysuria: Secondary | ICD-10-CM | POA: Diagnosis not present

## 2022-09-01 DIAGNOSIS — N390 Urinary tract infection, site not specified: Secondary | ICD-10-CM | POA: Diagnosis not present

## 2022-09-07 DIAGNOSIS — B9620 Unspecified Escherichia coli [E. coli] as the cause of diseases classified elsewhere: Secondary | ICD-10-CM | POA: Diagnosis not present

## 2022-09-07 DIAGNOSIS — F419 Anxiety disorder, unspecified: Secondary | ICD-10-CM | POA: Diagnosis not present

## 2022-09-07 DIAGNOSIS — F01A4 Vascular dementia, mild, with anxiety: Secondary | ICD-10-CM | POA: Diagnosis not present

## 2022-09-07 DIAGNOSIS — N3000 Acute cystitis without hematuria: Secondary | ICD-10-CM | POA: Diagnosis not present

## 2022-09-15 DIAGNOSIS — E039 Hypothyroidism, unspecified: Secondary | ICD-10-CM | POA: Diagnosis not present

## 2022-09-21 DIAGNOSIS — Z7189 Other specified counseling: Secondary | ICD-10-CM | POA: Diagnosis not present

## 2022-09-21 DIAGNOSIS — E039 Hypothyroidism, unspecified: Secondary | ICD-10-CM | POA: Diagnosis not present

## 2022-10-18 DIAGNOSIS — Z7901 Long term (current) use of anticoagulants: Secondary | ICD-10-CM | POA: Diagnosis not present

## 2022-11-08 DIAGNOSIS — E039 Hypothyroidism, unspecified: Secondary | ICD-10-CM | POA: Diagnosis not present

## 2022-11-22 ENCOUNTER — Telehealth: Payer: Self-pay

## 2022-11-22 NOTE — Patient Outreach (Signed)
  Care Coordination   Initial Visit Note   11/22/2022 Name: Tara [name] MRN: [id number] DOB: 11-09-1936  Tara [name] is a 87 y.o. year old female who sees [facility] [place] for primary care. I spoke with  [place] by phone today.  What matters to the patients health and wellness today?  Placed call to patient and son answered. States he is POA.  Reports patient had a major stroke and is in an assisted living. Reports no longer goes to [facility].      SDOH assessments and interventions completed:  No     Care Coordination Interventions:  No, not indicated   Follow up plan: No further intervention required.   Encounter Outcome:  Pt. Visit Completed   Tomasa Rand, RN, BSN, CEN [place] Coordinator [phone]

## 2022-12-27 DIAGNOSIS — I619 Nontraumatic intracerebral hemorrhage, unspecified: Secondary | ICD-10-CM | POA: Diagnosis not present

## 2022-12-27 DIAGNOSIS — E039 Hypothyroidism, unspecified: Secondary | ICD-10-CM | POA: Diagnosis not present

## 2023-01-04 DIAGNOSIS — U071 COVID-19: Secondary | ICD-10-CM | POA: Diagnosis not present

## 2023-01-14 DIAGNOSIS — N390 Urinary tract infection, site not specified: Secondary | ICD-10-CM | POA: Diagnosis not present

## 2023-01-18 DIAGNOSIS — N390 Urinary tract infection, site not specified: Secondary | ICD-10-CM | POA: Diagnosis not present

## 2023-01-18 DIAGNOSIS — I69311 Memory deficit following cerebral infarction: Secondary | ICD-10-CM | POA: Diagnosis not present

## 2023-01-18 DIAGNOSIS — F01518 Vascular dementia, unspecified severity, with other behavioral disturbance: Secondary | ICD-10-CM | POA: Diagnosis not present

## 2023-01-25 DIAGNOSIS — R112 Nausea with vomiting, unspecified: Secondary | ICD-10-CM | POA: Diagnosis not present

## 2023-01-25 DIAGNOSIS — R42 Dizziness and giddiness: Secondary | ICD-10-CM | POA: Diagnosis not present

## 2023-01-25 DIAGNOSIS — F01518 Vascular dementia, unspecified severity, with other behavioral disturbance: Secondary | ICD-10-CM | POA: Diagnosis not present

## 2023-01-25 DIAGNOSIS — I69311 Memory deficit following cerebral infarction: Secondary | ICD-10-CM | POA: Diagnosis not present

## 2023-01-30 DIAGNOSIS — R7303 Prediabetes: Secondary | ICD-10-CM | POA: Diagnosis not present

## 2023-01-30 DIAGNOSIS — R519 Headache, unspecified: Secondary | ICD-10-CM | POA: Diagnosis not present

## 2023-01-30 DIAGNOSIS — M4800 Spinal stenosis, site unspecified: Secondary | ICD-10-CM | POA: Diagnosis not present

## 2023-01-30 DIAGNOSIS — E039 Hypothyroidism, unspecified: Secondary | ICD-10-CM | POA: Diagnosis not present

## 2023-01-30 DIAGNOSIS — I10 Essential (primary) hypertension: Secondary | ICD-10-CM | POA: Diagnosis not present

## 2023-01-30 DIAGNOSIS — R630 Anorexia: Secondary | ICD-10-CM | POA: Diagnosis not present

## 2023-01-30 DIAGNOSIS — E785 Hyperlipidemia, unspecified: Secondary | ICD-10-CM | POA: Diagnosis not present

## 2023-01-30 DIAGNOSIS — F319 Bipolar disorder, unspecified: Secondary | ICD-10-CM | POA: Diagnosis not present

## 2023-01-30 DIAGNOSIS — F411 Generalized anxiety disorder: Secondary | ICD-10-CM | POA: Diagnosis not present

## 2023-01-30 DIAGNOSIS — G4700 Insomnia, unspecified: Secondary | ICD-10-CM | POA: Diagnosis not present

## 2023-01-30 DIAGNOSIS — I69320 Aphasia following cerebral infarction: Secondary | ICD-10-CM | POA: Diagnosis not present

## 2023-01-30 DIAGNOSIS — F01518 Vascular dementia, unspecified severity, with other behavioral disturbance: Secondary | ICD-10-CM | POA: Diagnosis not present

## 2023-01-30 DIAGNOSIS — F0154 Vascular dementia, unspecified severity, with anxiety: Secondary | ICD-10-CM | POA: Diagnosis not present

## 2023-01-30 DIAGNOSIS — M5416 Radiculopathy, lumbar region: Secondary | ICD-10-CM | POA: Diagnosis not present

## 2023-01-30 DIAGNOSIS — R42 Dizziness and giddiness: Secondary | ICD-10-CM | POA: Diagnosis not present

## 2023-01-30 DIAGNOSIS — F0153 Vascular dementia, unspecified severity, with mood disturbance: Secondary | ICD-10-CM | POA: Diagnosis not present

## 2023-01-31 DIAGNOSIS — E039 Hypothyroidism, unspecified: Secondary | ICD-10-CM | POA: Diagnosis not present

## 2023-01-31 DIAGNOSIS — I10 Essential (primary) hypertension: Secondary | ICD-10-CM | POA: Diagnosis not present

## 2023-01-31 DIAGNOSIS — E785 Hyperlipidemia, unspecified: Secondary | ICD-10-CM | POA: Diagnosis not present

## 2023-02-02 DIAGNOSIS — E785 Hyperlipidemia, unspecified: Secondary | ICD-10-CM | POA: Diagnosis not present

## 2023-02-02 DIAGNOSIS — F411 Generalized anxiety disorder: Secondary | ICD-10-CM | POA: Diagnosis not present

## 2023-02-02 DIAGNOSIS — F0153 Vascular dementia, unspecified severity, with mood disturbance: Secondary | ICD-10-CM | POA: Diagnosis not present

## 2023-02-02 DIAGNOSIS — R7303 Prediabetes: Secondary | ICD-10-CM | POA: Diagnosis not present

## 2023-02-02 DIAGNOSIS — R630 Anorexia: Secondary | ICD-10-CM | POA: Diagnosis not present

## 2023-02-02 DIAGNOSIS — F319 Bipolar disorder, unspecified: Secondary | ICD-10-CM | POA: Diagnosis not present

## 2023-02-02 DIAGNOSIS — I69320 Aphasia following cerebral infarction: Secondary | ICD-10-CM | POA: Diagnosis not present

## 2023-02-02 DIAGNOSIS — F01518 Vascular dementia, unspecified severity, with other behavioral disturbance: Secondary | ICD-10-CM | POA: Diagnosis not present

## 2023-02-02 DIAGNOSIS — F0154 Vascular dementia, unspecified severity, with anxiety: Secondary | ICD-10-CM | POA: Diagnosis not present

## 2023-02-02 DIAGNOSIS — I10 Essential (primary) hypertension: Secondary | ICD-10-CM | POA: Diagnosis not present

## 2023-02-02 DIAGNOSIS — G4700 Insomnia, unspecified: Secondary | ICD-10-CM | POA: Diagnosis not present

## 2023-02-02 DIAGNOSIS — R519 Headache, unspecified: Secondary | ICD-10-CM | POA: Diagnosis not present

## 2023-02-02 DIAGNOSIS — R42 Dizziness and giddiness: Secondary | ICD-10-CM | POA: Diagnosis not present

## 2023-02-02 DIAGNOSIS — M4800 Spinal stenosis, site unspecified: Secondary | ICD-10-CM | POA: Diagnosis not present

## 2023-02-02 DIAGNOSIS — E039 Hypothyroidism, unspecified: Secondary | ICD-10-CM | POA: Diagnosis not present

## 2023-02-02 DIAGNOSIS — M5416 Radiculopathy, lumbar region: Secondary | ICD-10-CM | POA: Diagnosis not present

## 2023-02-06 DIAGNOSIS — I69320 Aphasia following cerebral infarction: Secondary | ICD-10-CM | POA: Diagnosis not present

## 2023-02-06 DIAGNOSIS — R519 Headache, unspecified: Secondary | ICD-10-CM | POA: Diagnosis not present

## 2023-02-06 DIAGNOSIS — G4700 Insomnia, unspecified: Secondary | ICD-10-CM | POA: Diagnosis not present

## 2023-02-06 DIAGNOSIS — F01518 Vascular dementia, unspecified severity, with other behavioral disturbance: Secondary | ICD-10-CM | POA: Diagnosis not present

## 2023-02-06 DIAGNOSIS — F319 Bipolar disorder, unspecified: Secondary | ICD-10-CM | POA: Diagnosis not present

## 2023-02-06 DIAGNOSIS — M4800 Spinal stenosis, site unspecified: Secondary | ICD-10-CM | POA: Diagnosis not present

## 2023-02-06 DIAGNOSIS — R42 Dizziness and giddiness: Secondary | ICD-10-CM | POA: Diagnosis not present

## 2023-02-06 DIAGNOSIS — M5416 Radiculopathy, lumbar region: Secondary | ICD-10-CM | POA: Diagnosis not present

## 2023-02-06 DIAGNOSIS — F0154 Vascular dementia, unspecified severity, with anxiety: Secondary | ICD-10-CM | POA: Diagnosis not present

## 2023-02-06 DIAGNOSIS — I10 Essential (primary) hypertension: Secondary | ICD-10-CM | POA: Diagnosis not present

## 2023-02-06 DIAGNOSIS — F0153 Vascular dementia, unspecified severity, with mood disturbance: Secondary | ICD-10-CM | POA: Diagnosis not present

## 2023-02-06 DIAGNOSIS — E039 Hypothyroidism, unspecified: Secondary | ICD-10-CM | POA: Diagnosis not present

## 2023-02-06 DIAGNOSIS — R7303 Prediabetes: Secondary | ICD-10-CM | POA: Diagnosis not present

## 2023-02-06 DIAGNOSIS — E785 Hyperlipidemia, unspecified: Secondary | ICD-10-CM | POA: Diagnosis not present

## 2023-02-06 DIAGNOSIS — R630 Anorexia: Secondary | ICD-10-CM | POA: Diagnosis not present

## 2023-02-06 DIAGNOSIS — F411 Generalized anxiety disorder: Secondary | ICD-10-CM | POA: Diagnosis not present

## 2023-02-08 DIAGNOSIS — F319 Bipolar disorder, unspecified: Secondary | ICD-10-CM | POA: Diagnosis not present

## 2023-02-08 DIAGNOSIS — M5416 Radiculopathy, lumbar region: Secondary | ICD-10-CM | POA: Diagnosis not present

## 2023-02-08 DIAGNOSIS — F411 Generalized anxiety disorder: Secondary | ICD-10-CM | POA: Diagnosis not present

## 2023-02-08 DIAGNOSIS — F0153 Vascular dementia, unspecified severity, with mood disturbance: Secondary | ICD-10-CM | POA: Diagnosis not present

## 2023-02-08 DIAGNOSIS — R42 Dizziness and giddiness: Secondary | ICD-10-CM | POA: Diagnosis not present

## 2023-02-08 DIAGNOSIS — F0154 Vascular dementia, unspecified severity, with anxiety: Secondary | ICD-10-CM | POA: Diagnosis not present

## 2023-02-08 DIAGNOSIS — I69320 Aphasia following cerebral infarction: Secondary | ICD-10-CM | POA: Diagnosis not present

## 2023-02-08 DIAGNOSIS — F01518 Vascular dementia, unspecified severity, with other behavioral disturbance: Secondary | ICD-10-CM | POA: Diagnosis not present

## 2023-02-08 DIAGNOSIS — R519 Headache, unspecified: Secondary | ICD-10-CM | POA: Diagnosis not present

## 2023-02-08 DIAGNOSIS — E785 Hyperlipidemia, unspecified: Secondary | ICD-10-CM | POA: Diagnosis not present

## 2023-02-08 DIAGNOSIS — I10 Essential (primary) hypertension: Secondary | ICD-10-CM | POA: Diagnosis not present

## 2023-02-08 DIAGNOSIS — R7303 Prediabetes: Secondary | ICD-10-CM | POA: Diagnosis not present

## 2023-02-08 DIAGNOSIS — R630 Anorexia: Secondary | ICD-10-CM | POA: Diagnosis not present

## 2023-02-08 DIAGNOSIS — E039 Hypothyroidism, unspecified: Secondary | ICD-10-CM | POA: Diagnosis not present

## 2023-02-08 DIAGNOSIS — M4800 Spinal stenosis, site unspecified: Secondary | ICD-10-CM | POA: Diagnosis not present

## 2023-02-08 DIAGNOSIS — G4700 Insomnia, unspecified: Secondary | ICD-10-CM | POA: Diagnosis not present

## 2023-02-14 DIAGNOSIS — I69320 Aphasia following cerebral infarction: Secondary | ICD-10-CM | POA: Diagnosis not present

## 2023-02-14 DIAGNOSIS — M5416 Radiculopathy, lumbar region: Secondary | ICD-10-CM | POA: Diagnosis not present

## 2023-02-14 DIAGNOSIS — F411 Generalized anxiety disorder: Secondary | ICD-10-CM | POA: Diagnosis not present

## 2023-02-14 DIAGNOSIS — R519 Headache, unspecified: Secondary | ICD-10-CM | POA: Diagnosis not present

## 2023-02-14 DIAGNOSIS — F0153 Vascular dementia, unspecified severity, with mood disturbance: Secondary | ICD-10-CM | POA: Diagnosis not present

## 2023-02-14 DIAGNOSIS — R42 Dizziness and giddiness: Secondary | ICD-10-CM | POA: Diagnosis not present

## 2023-02-14 DIAGNOSIS — G4700 Insomnia, unspecified: Secondary | ICD-10-CM | POA: Diagnosis not present

## 2023-02-14 DIAGNOSIS — F319 Bipolar disorder, unspecified: Secondary | ICD-10-CM | POA: Diagnosis not present

## 2023-02-14 DIAGNOSIS — I10 Essential (primary) hypertension: Secondary | ICD-10-CM | POA: Diagnosis not present

## 2023-02-14 DIAGNOSIS — E039 Hypothyroidism, unspecified: Secondary | ICD-10-CM | POA: Diagnosis not present

## 2023-02-14 DIAGNOSIS — R7303 Prediabetes: Secondary | ICD-10-CM | POA: Diagnosis not present

## 2023-02-14 DIAGNOSIS — M4800 Spinal stenosis, site unspecified: Secondary | ICD-10-CM | POA: Diagnosis not present

## 2023-02-14 DIAGNOSIS — R630 Anorexia: Secondary | ICD-10-CM | POA: Diagnosis not present

## 2023-02-14 DIAGNOSIS — F0154 Vascular dementia, unspecified severity, with anxiety: Secondary | ICD-10-CM | POA: Diagnosis not present

## 2023-02-14 DIAGNOSIS — E785 Hyperlipidemia, unspecified: Secondary | ICD-10-CM | POA: Diagnosis not present

## 2023-02-14 DIAGNOSIS — F01518 Vascular dementia, unspecified severity, with other behavioral disturbance: Secondary | ICD-10-CM | POA: Diagnosis not present

## 2023-02-21 DIAGNOSIS — I69320 Aphasia following cerebral infarction: Secondary | ICD-10-CM | POA: Diagnosis not present

## 2023-02-21 DIAGNOSIS — F01518 Vascular dementia, unspecified severity, with other behavioral disturbance: Secondary | ICD-10-CM | POA: Diagnosis not present

## 2023-02-21 DIAGNOSIS — R7303 Prediabetes: Secondary | ICD-10-CM | POA: Diagnosis not present

## 2023-02-21 DIAGNOSIS — F411 Generalized anxiety disorder: Secondary | ICD-10-CM | POA: Diagnosis not present

## 2023-02-21 DIAGNOSIS — R630 Anorexia: Secondary | ICD-10-CM | POA: Diagnosis not present

## 2023-02-21 DIAGNOSIS — E785 Hyperlipidemia, unspecified: Secondary | ICD-10-CM | POA: Diagnosis not present

## 2023-02-21 DIAGNOSIS — R519 Headache, unspecified: Secondary | ICD-10-CM | POA: Diagnosis not present

## 2023-02-21 DIAGNOSIS — F0154 Vascular dementia, unspecified severity, with anxiety: Secondary | ICD-10-CM | POA: Diagnosis not present

## 2023-02-21 DIAGNOSIS — E039 Hypothyroidism, unspecified: Secondary | ICD-10-CM | POA: Diagnosis not present

## 2023-02-21 DIAGNOSIS — I10 Essential (primary) hypertension: Secondary | ICD-10-CM | POA: Diagnosis not present

## 2023-02-21 DIAGNOSIS — R42 Dizziness and giddiness: Secondary | ICD-10-CM | POA: Diagnosis not present

## 2023-02-21 DIAGNOSIS — M5416 Radiculopathy, lumbar region: Secondary | ICD-10-CM | POA: Diagnosis not present

## 2023-02-21 DIAGNOSIS — G4700 Insomnia, unspecified: Secondary | ICD-10-CM | POA: Diagnosis not present

## 2023-02-21 DIAGNOSIS — M4800 Spinal stenosis, site unspecified: Secondary | ICD-10-CM | POA: Diagnosis not present

## 2023-02-21 DIAGNOSIS — F319 Bipolar disorder, unspecified: Secondary | ICD-10-CM | POA: Diagnosis not present

## 2023-02-21 DIAGNOSIS — F0153 Vascular dementia, unspecified severity, with mood disturbance: Secondary | ICD-10-CM | POA: Diagnosis not present

## 2023-02-23 DIAGNOSIS — M5416 Radiculopathy, lumbar region: Secondary | ICD-10-CM | POA: Diagnosis not present

## 2023-02-23 DIAGNOSIS — M4800 Spinal stenosis, site unspecified: Secondary | ICD-10-CM | POA: Diagnosis not present

## 2023-02-23 DIAGNOSIS — F411 Generalized anxiety disorder: Secondary | ICD-10-CM | POA: Diagnosis not present

## 2023-02-23 DIAGNOSIS — I69320 Aphasia following cerebral infarction: Secondary | ICD-10-CM | POA: Diagnosis not present

## 2023-02-23 DIAGNOSIS — G4700 Insomnia, unspecified: Secondary | ICD-10-CM | POA: Diagnosis not present

## 2023-02-23 DIAGNOSIS — F0154 Vascular dementia, unspecified severity, with anxiety: Secondary | ICD-10-CM | POA: Diagnosis not present

## 2023-02-23 DIAGNOSIS — F0153 Vascular dementia, unspecified severity, with mood disturbance: Secondary | ICD-10-CM | POA: Diagnosis not present

## 2023-02-23 DIAGNOSIS — R630 Anorexia: Secondary | ICD-10-CM | POA: Diagnosis not present

## 2023-02-23 DIAGNOSIS — E785 Hyperlipidemia, unspecified: Secondary | ICD-10-CM | POA: Diagnosis not present

## 2023-02-23 DIAGNOSIS — R42 Dizziness and giddiness: Secondary | ICD-10-CM | POA: Diagnosis not present

## 2023-02-23 DIAGNOSIS — R7303 Prediabetes: Secondary | ICD-10-CM | POA: Diagnosis not present

## 2023-02-23 DIAGNOSIS — F01518 Vascular dementia, unspecified severity, with other behavioral disturbance: Secondary | ICD-10-CM | POA: Diagnosis not present

## 2023-02-23 DIAGNOSIS — E039 Hypothyroidism, unspecified: Secondary | ICD-10-CM | POA: Diagnosis not present

## 2023-02-23 DIAGNOSIS — I10 Essential (primary) hypertension: Secondary | ICD-10-CM | POA: Diagnosis not present

## 2023-02-23 DIAGNOSIS — R519 Headache, unspecified: Secondary | ICD-10-CM | POA: Diagnosis not present

## 2023-02-23 DIAGNOSIS — F319 Bipolar disorder, unspecified: Secondary | ICD-10-CM | POA: Diagnosis not present

## 2023-02-27 DIAGNOSIS — E785 Hyperlipidemia, unspecified: Secondary | ICD-10-CM | POA: Diagnosis not present

## 2023-02-27 DIAGNOSIS — M4800 Spinal stenosis, site unspecified: Secondary | ICD-10-CM | POA: Diagnosis not present

## 2023-02-27 DIAGNOSIS — E039 Hypothyroidism, unspecified: Secondary | ICD-10-CM | POA: Diagnosis not present

## 2023-02-27 DIAGNOSIS — N319 Neuromuscular dysfunction of bladder, unspecified: Secondary | ICD-10-CM | POA: Diagnosis not present

## 2023-02-27 DIAGNOSIS — R630 Anorexia: Secondary | ICD-10-CM | POA: Diagnosis not present

## 2023-02-27 DIAGNOSIS — R7303 Prediabetes: Secondary | ICD-10-CM | POA: Diagnosis not present

## 2023-02-27 DIAGNOSIS — F319 Bipolar disorder, unspecified: Secondary | ICD-10-CM | POA: Diagnosis not present

## 2023-02-27 DIAGNOSIS — I69320 Aphasia following cerebral infarction: Secondary | ICD-10-CM | POA: Diagnosis not present

## 2023-02-27 DIAGNOSIS — G4700 Insomnia, unspecified: Secondary | ICD-10-CM | POA: Diagnosis not present

## 2023-02-27 DIAGNOSIS — F0154 Vascular dementia, unspecified severity, with anxiety: Secondary | ICD-10-CM | POA: Diagnosis not present

## 2023-02-27 DIAGNOSIS — M5416 Radiculopathy, lumbar region: Secondary | ICD-10-CM | POA: Diagnosis not present

## 2023-02-27 DIAGNOSIS — Z87440 Personal history of urinary (tract) infections: Secondary | ICD-10-CM | POA: Diagnosis not present

## 2023-02-27 DIAGNOSIS — I10 Essential (primary) hypertension: Secondary | ICD-10-CM | POA: Diagnosis not present

## 2023-02-27 DIAGNOSIS — F01518 Vascular dementia, unspecified severity, with other behavioral disturbance: Secondary | ICD-10-CM | POA: Diagnosis not present

## 2023-02-27 DIAGNOSIS — F0153 Vascular dementia, unspecified severity, with mood disturbance: Secondary | ICD-10-CM | POA: Diagnosis not present

## 2023-02-27 DIAGNOSIS — F411 Generalized anxiety disorder: Secondary | ICD-10-CM | POA: Diagnosis not present

## 2023-02-27 DIAGNOSIS — R42 Dizziness and giddiness: Secondary | ICD-10-CM | POA: Diagnosis not present

## 2023-02-27 DIAGNOSIS — R519 Headache, unspecified: Secondary | ICD-10-CM | POA: Diagnosis not present

## 2023-03-06 DIAGNOSIS — R630 Anorexia: Secondary | ICD-10-CM | POA: Diagnosis not present

## 2023-03-06 DIAGNOSIS — M4800 Spinal stenosis, site unspecified: Secondary | ICD-10-CM | POA: Diagnosis not present

## 2023-03-06 DIAGNOSIS — F411 Generalized anxiety disorder: Secondary | ICD-10-CM | POA: Diagnosis not present

## 2023-03-06 DIAGNOSIS — F319 Bipolar disorder, unspecified: Secondary | ICD-10-CM | POA: Diagnosis not present

## 2023-03-06 DIAGNOSIS — G4700 Insomnia, unspecified: Secondary | ICD-10-CM | POA: Diagnosis not present

## 2023-03-06 DIAGNOSIS — I10 Essential (primary) hypertension: Secondary | ICD-10-CM | POA: Diagnosis not present

## 2023-03-06 DIAGNOSIS — M5416 Radiculopathy, lumbar region: Secondary | ICD-10-CM | POA: Diagnosis not present

## 2023-03-06 DIAGNOSIS — R519 Headache, unspecified: Secondary | ICD-10-CM | POA: Diagnosis not present

## 2023-03-06 DIAGNOSIS — R7303 Prediabetes: Secondary | ICD-10-CM | POA: Diagnosis not present

## 2023-03-06 DIAGNOSIS — F0153 Vascular dementia, unspecified severity, with mood disturbance: Secondary | ICD-10-CM | POA: Diagnosis not present

## 2023-03-06 DIAGNOSIS — F01518 Vascular dementia, unspecified severity, with other behavioral disturbance: Secondary | ICD-10-CM | POA: Diagnosis not present

## 2023-03-06 DIAGNOSIS — E039 Hypothyroidism, unspecified: Secondary | ICD-10-CM | POA: Diagnosis not present

## 2023-03-06 DIAGNOSIS — I69320 Aphasia following cerebral infarction: Secondary | ICD-10-CM | POA: Diagnosis not present

## 2023-03-06 DIAGNOSIS — F0154 Vascular dementia, unspecified severity, with anxiety: Secondary | ICD-10-CM | POA: Diagnosis not present

## 2023-03-06 DIAGNOSIS — R42 Dizziness and giddiness: Secondary | ICD-10-CM | POA: Diagnosis not present

## 2023-03-06 DIAGNOSIS — E785 Hyperlipidemia, unspecified: Secondary | ICD-10-CM | POA: Diagnosis not present

## 2023-03-16 DIAGNOSIS — E785 Hyperlipidemia, unspecified: Secondary | ICD-10-CM | POA: Diagnosis not present

## 2023-03-16 DIAGNOSIS — R7303 Prediabetes: Secondary | ICD-10-CM | POA: Diagnosis not present

## 2023-03-16 DIAGNOSIS — R519 Headache, unspecified: Secondary | ICD-10-CM | POA: Diagnosis not present

## 2023-03-16 DIAGNOSIS — G4700 Insomnia, unspecified: Secondary | ICD-10-CM | POA: Diagnosis not present

## 2023-03-16 DIAGNOSIS — R42 Dizziness and giddiness: Secondary | ICD-10-CM | POA: Diagnosis not present

## 2023-03-16 DIAGNOSIS — I10 Essential (primary) hypertension: Secondary | ICD-10-CM | POA: Diagnosis not present

## 2023-03-16 DIAGNOSIS — I69320 Aphasia following cerebral infarction: Secondary | ICD-10-CM | POA: Diagnosis not present

## 2023-03-16 DIAGNOSIS — F0154 Vascular dementia, unspecified severity, with anxiety: Secondary | ICD-10-CM | POA: Diagnosis not present

## 2023-03-16 DIAGNOSIS — E039 Hypothyroidism, unspecified: Secondary | ICD-10-CM | POA: Diagnosis not present

## 2023-03-16 DIAGNOSIS — F319 Bipolar disorder, unspecified: Secondary | ICD-10-CM | POA: Diagnosis not present

## 2023-03-16 DIAGNOSIS — F0153 Vascular dementia, unspecified severity, with mood disturbance: Secondary | ICD-10-CM | POA: Diagnosis not present

## 2023-03-16 DIAGNOSIS — R630 Anorexia: Secondary | ICD-10-CM | POA: Diagnosis not present

## 2023-03-16 DIAGNOSIS — M4800 Spinal stenosis, site unspecified: Secondary | ICD-10-CM | POA: Diagnosis not present

## 2023-03-16 DIAGNOSIS — F411 Generalized anxiety disorder: Secondary | ICD-10-CM | POA: Diagnosis not present

## 2023-03-16 DIAGNOSIS — M5416 Radiculopathy, lumbar region: Secondary | ICD-10-CM | POA: Diagnosis not present

## 2023-03-16 DIAGNOSIS — F01518 Vascular dementia, unspecified severity, with other behavioral disturbance: Secondary | ICD-10-CM | POA: Diagnosis not present

## 2023-03-21 DIAGNOSIS — I69320 Aphasia following cerebral infarction: Secondary | ICD-10-CM | POA: Diagnosis not present

## 2023-03-21 DIAGNOSIS — M5416 Radiculopathy, lumbar region: Secondary | ICD-10-CM | POA: Diagnosis not present

## 2023-03-21 DIAGNOSIS — G4700 Insomnia, unspecified: Secondary | ICD-10-CM | POA: Diagnosis not present

## 2023-03-21 DIAGNOSIS — F0153 Vascular dementia, unspecified severity, with mood disturbance: Secondary | ICD-10-CM | POA: Diagnosis not present

## 2023-03-21 DIAGNOSIS — E039 Hypothyroidism, unspecified: Secondary | ICD-10-CM | POA: Diagnosis not present

## 2023-03-21 DIAGNOSIS — F01518 Vascular dementia, unspecified severity, with other behavioral disturbance: Secondary | ICD-10-CM | POA: Diagnosis not present

## 2023-03-21 DIAGNOSIS — R42 Dizziness and giddiness: Secondary | ICD-10-CM | POA: Diagnosis not present

## 2023-03-21 DIAGNOSIS — M4800 Spinal stenosis, site unspecified: Secondary | ICD-10-CM | POA: Diagnosis not present

## 2023-03-21 DIAGNOSIS — I10 Essential (primary) hypertension: Secondary | ICD-10-CM | POA: Diagnosis not present

## 2023-03-21 DIAGNOSIS — R519 Headache, unspecified: Secondary | ICD-10-CM | POA: Diagnosis not present

## 2023-03-21 DIAGNOSIS — R630 Anorexia: Secondary | ICD-10-CM | POA: Diagnosis not present

## 2023-03-21 DIAGNOSIS — E785 Hyperlipidemia, unspecified: Secondary | ICD-10-CM | POA: Diagnosis not present

## 2023-03-21 DIAGNOSIS — F0154 Vascular dementia, unspecified severity, with anxiety: Secondary | ICD-10-CM | POA: Diagnosis not present

## 2023-03-21 DIAGNOSIS — F411 Generalized anxiety disorder: Secondary | ICD-10-CM | POA: Diagnosis not present

## 2023-03-21 DIAGNOSIS — F319 Bipolar disorder, unspecified: Secondary | ICD-10-CM | POA: Diagnosis not present

## 2023-03-21 DIAGNOSIS — R7303 Prediabetes: Secondary | ICD-10-CM | POA: Diagnosis not present

## 2023-03-28 DIAGNOSIS — I69320 Aphasia following cerebral infarction: Secondary | ICD-10-CM | POA: Diagnosis not present

## 2023-03-28 DIAGNOSIS — M4800 Spinal stenosis, site unspecified: Secondary | ICD-10-CM | POA: Diagnosis not present

## 2023-03-28 DIAGNOSIS — R630 Anorexia: Secondary | ICD-10-CM | POA: Diagnosis not present

## 2023-03-28 DIAGNOSIS — Z8673 Personal history of transient ischemic attack (TIA), and cerebral infarction without residual deficits: Secondary | ICD-10-CM | POA: Diagnosis not present

## 2023-03-28 DIAGNOSIS — F319 Bipolar disorder, unspecified: Secondary | ICD-10-CM | POA: Diagnosis not present

## 2023-03-28 DIAGNOSIS — R42 Dizziness and giddiness: Secondary | ICD-10-CM | POA: Diagnosis not present

## 2023-03-28 DIAGNOSIS — R7303 Prediabetes: Secondary | ICD-10-CM | POA: Diagnosis not present

## 2023-03-28 DIAGNOSIS — F5105 Insomnia due to other mental disorder: Secondary | ICD-10-CM | POA: Diagnosis not present

## 2023-03-28 DIAGNOSIS — R519 Headache, unspecified: Secondary | ICD-10-CM | POA: Diagnosis not present

## 2023-03-28 DIAGNOSIS — F0153 Vascular dementia, unspecified severity, with mood disturbance: Secondary | ICD-10-CM | POA: Diagnosis not present

## 2023-03-28 DIAGNOSIS — M5416 Radiculopathy, lumbar region: Secondary | ICD-10-CM | POA: Diagnosis not present

## 2023-03-28 DIAGNOSIS — G4700 Insomnia, unspecified: Secondary | ICD-10-CM | POA: Diagnosis not present

## 2023-03-28 DIAGNOSIS — F411 Generalized anxiety disorder: Secondary | ICD-10-CM | POA: Diagnosis not present

## 2023-03-28 DIAGNOSIS — I10 Essential (primary) hypertension: Secondary | ICD-10-CM | POA: Diagnosis not present

## 2023-03-28 DIAGNOSIS — E785 Hyperlipidemia, unspecified: Secondary | ICD-10-CM | POA: Diagnosis not present

## 2023-03-28 DIAGNOSIS — F01518 Vascular dementia, unspecified severity, with other behavioral disturbance: Secondary | ICD-10-CM | POA: Diagnosis not present

## 2023-03-28 DIAGNOSIS — F0154 Vascular dementia, unspecified severity, with anxiety: Secondary | ICD-10-CM | POA: Diagnosis not present

## 2023-03-28 DIAGNOSIS — E039 Hypothyroidism, unspecified: Secondary | ICD-10-CM | POA: Diagnosis not present

## 2023-04-04 DIAGNOSIS — M5416 Radiculopathy, lumbar region: Secondary | ICD-10-CM | POA: Diagnosis not present

## 2023-04-04 DIAGNOSIS — R519 Headache, unspecified: Secondary | ICD-10-CM | POA: Diagnosis not present

## 2023-04-04 DIAGNOSIS — R7303 Prediabetes: Secondary | ICD-10-CM | POA: Diagnosis not present

## 2023-04-04 DIAGNOSIS — F0154 Vascular dementia, unspecified severity, with anxiety: Secondary | ICD-10-CM | POA: Diagnosis not present

## 2023-04-04 DIAGNOSIS — R630 Anorexia: Secondary | ICD-10-CM | POA: Diagnosis not present

## 2023-04-04 DIAGNOSIS — R42 Dizziness and giddiness: Secondary | ICD-10-CM | POA: Diagnosis not present

## 2023-04-04 DIAGNOSIS — F319 Bipolar disorder, unspecified: Secondary | ICD-10-CM | POA: Diagnosis not present

## 2023-04-04 DIAGNOSIS — F0153 Vascular dementia, unspecified severity, with mood disturbance: Secondary | ICD-10-CM | POA: Diagnosis not present

## 2023-04-04 DIAGNOSIS — F411 Generalized anxiety disorder: Secondary | ICD-10-CM | POA: Diagnosis not present

## 2023-04-04 DIAGNOSIS — G4700 Insomnia, unspecified: Secondary | ICD-10-CM | POA: Diagnosis not present

## 2023-04-04 DIAGNOSIS — M4800 Spinal stenosis, site unspecified: Secondary | ICD-10-CM | POA: Diagnosis not present

## 2023-04-04 DIAGNOSIS — F01518 Vascular dementia, unspecified severity, with other behavioral disturbance: Secondary | ICD-10-CM | POA: Diagnosis not present

## 2023-04-04 DIAGNOSIS — I10 Essential (primary) hypertension: Secondary | ICD-10-CM | POA: Diagnosis not present

## 2023-04-04 DIAGNOSIS — E039 Hypothyroidism, unspecified: Secondary | ICD-10-CM | POA: Diagnosis not present

## 2023-04-04 DIAGNOSIS — E785 Hyperlipidemia, unspecified: Secondary | ICD-10-CM | POA: Diagnosis not present

## 2023-04-04 DIAGNOSIS — I69320 Aphasia following cerebral infarction: Secondary | ICD-10-CM | POA: Diagnosis not present

## 2023-04-07 DIAGNOSIS — R7303 Prediabetes: Secondary | ICD-10-CM | POA: Diagnosis not present

## 2023-04-07 DIAGNOSIS — M5416 Radiculopathy, lumbar region: Secondary | ICD-10-CM | POA: Diagnosis not present

## 2023-04-07 DIAGNOSIS — I69320 Aphasia following cerebral infarction: Secondary | ICD-10-CM | POA: Diagnosis not present

## 2023-04-07 DIAGNOSIS — G4700 Insomnia, unspecified: Secondary | ICD-10-CM | POA: Diagnosis not present

## 2023-04-07 DIAGNOSIS — F0153 Vascular dementia, unspecified severity, with mood disturbance: Secondary | ICD-10-CM | POA: Diagnosis not present

## 2023-04-07 DIAGNOSIS — R519 Headache, unspecified: Secondary | ICD-10-CM | POA: Diagnosis not present

## 2023-04-07 DIAGNOSIS — F411 Generalized anxiety disorder: Secondary | ICD-10-CM | POA: Diagnosis not present

## 2023-04-07 DIAGNOSIS — F319 Bipolar disorder, unspecified: Secondary | ICD-10-CM | POA: Diagnosis not present

## 2023-04-07 DIAGNOSIS — I10 Essential (primary) hypertension: Secondary | ICD-10-CM | POA: Diagnosis not present

## 2023-04-07 DIAGNOSIS — E785 Hyperlipidemia, unspecified: Secondary | ICD-10-CM | POA: Diagnosis not present

## 2023-04-07 DIAGNOSIS — F01518 Vascular dementia, unspecified severity, with other behavioral disturbance: Secondary | ICD-10-CM | POA: Diagnosis not present

## 2023-04-07 DIAGNOSIS — E039 Hypothyroidism, unspecified: Secondary | ICD-10-CM | POA: Diagnosis not present

## 2023-04-07 DIAGNOSIS — M4800 Spinal stenosis, site unspecified: Secondary | ICD-10-CM | POA: Diagnosis not present

## 2023-04-07 DIAGNOSIS — R42 Dizziness and giddiness: Secondary | ICD-10-CM | POA: Diagnosis not present

## 2023-04-07 DIAGNOSIS — R630 Anorexia: Secondary | ICD-10-CM | POA: Diagnosis not present

## 2023-04-07 DIAGNOSIS — F0154 Vascular dementia, unspecified severity, with anxiety: Secondary | ICD-10-CM | POA: Diagnosis not present

## 2023-04-10 DIAGNOSIS — F01518 Vascular dementia, unspecified severity, with other behavioral disturbance: Secondary | ICD-10-CM | POA: Diagnosis not present

## 2023-04-10 DIAGNOSIS — R7303 Prediabetes: Secondary | ICD-10-CM | POA: Diagnosis not present

## 2023-04-10 DIAGNOSIS — E785 Hyperlipidemia, unspecified: Secondary | ICD-10-CM | POA: Diagnosis not present

## 2023-04-10 DIAGNOSIS — F0154 Vascular dementia, unspecified severity, with anxiety: Secondary | ICD-10-CM | POA: Diagnosis not present

## 2023-04-10 DIAGNOSIS — R42 Dizziness and giddiness: Secondary | ICD-10-CM | POA: Diagnosis not present

## 2023-04-10 DIAGNOSIS — I10 Essential (primary) hypertension: Secondary | ICD-10-CM | POA: Diagnosis not present

## 2023-04-10 DIAGNOSIS — I69320 Aphasia following cerebral infarction: Secondary | ICD-10-CM | POA: Diagnosis not present

## 2023-04-10 DIAGNOSIS — F411 Generalized anxiety disorder: Secondary | ICD-10-CM | POA: Diagnosis not present

## 2023-04-10 DIAGNOSIS — F319 Bipolar disorder, unspecified: Secondary | ICD-10-CM | POA: Diagnosis not present

## 2023-04-10 DIAGNOSIS — M5416 Radiculopathy, lumbar region: Secondary | ICD-10-CM | POA: Diagnosis not present

## 2023-04-10 DIAGNOSIS — R630 Anorexia: Secondary | ICD-10-CM | POA: Diagnosis not present

## 2023-04-10 DIAGNOSIS — E039 Hypothyroidism, unspecified: Secondary | ICD-10-CM | POA: Diagnosis not present

## 2023-04-10 DIAGNOSIS — G4700 Insomnia, unspecified: Secondary | ICD-10-CM | POA: Diagnosis not present

## 2023-04-10 DIAGNOSIS — M4800 Spinal stenosis, site unspecified: Secondary | ICD-10-CM | POA: Diagnosis not present

## 2023-04-10 DIAGNOSIS — R519 Headache, unspecified: Secondary | ICD-10-CM | POA: Diagnosis not present

## 2023-04-10 DIAGNOSIS — F0153 Vascular dementia, unspecified severity, with mood disturbance: Secondary | ICD-10-CM | POA: Diagnosis not present

## 2023-04-11 DIAGNOSIS — E039 Hypothyroidism, unspecified: Secondary | ICD-10-CM | POA: Diagnosis not present

## 2023-04-11 DIAGNOSIS — F3131 Bipolar disorder, current episode depressed, mild: Secondary | ICD-10-CM | POA: Diagnosis not present

## 2023-04-11 DIAGNOSIS — F5105 Insomnia due to other mental disorder: Secondary | ICD-10-CM | POA: Diagnosis not present

## 2023-04-11 DIAGNOSIS — Z8673 Personal history of transient ischemic attack (TIA), and cerebral infarction without residual deficits: Secondary | ICD-10-CM | POA: Diagnosis not present

## 2023-04-11 DIAGNOSIS — F411 Generalized anxiety disorder: Secondary | ICD-10-CM | POA: Diagnosis not present

## 2023-04-12 DIAGNOSIS — E039 Hypothyroidism, unspecified: Secondary | ICD-10-CM | POA: Diagnosis not present

## 2023-04-12 DIAGNOSIS — I69320 Aphasia following cerebral infarction: Secondary | ICD-10-CM | POA: Diagnosis not present

## 2023-04-12 DIAGNOSIS — R42 Dizziness and giddiness: Secondary | ICD-10-CM | POA: Diagnosis not present

## 2023-04-12 DIAGNOSIS — R519 Headache, unspecified: Secondary | ICD-10-CM | POA: Diagnosis not present

## 2023-04-12 DIAGNOSIS — M5416 Radiculopathy, lumbar region: Secondary | ICD-10-CM | POA: Diagnosis not present

## 2023-04-12 DIAGNOSIS — F0153 Vascular dementia, unspecified severity, with mood disturbance: Secondary | ICD-10-CM | POA: Diagnosis not present

## 2023-04-12 DIAGNOSIS — M4800 Spinal stenosis, site unspecified: Secondary | ICD-10-CM | POA: Diagnosis not present

## 2023-04-12 DIAGNOSIS — E785 Hyperlipidemia, unspecified: Secondary | ICD-10-CM | POA: Diagnosis not present

## 2023-04-12 DIAGNOSIS — F319 Bipolar disorder, unspecified: Secondary | ICD-10-CM | POA: Diagnosis not present

## 2023-04-12 DIAGNOSIS — F411 Generalized anxiety disorder: Secondary | ICD-10-CM | POA: Diagnosis not present

## 2023-04-12 DIAGNOSIS — F01518 Vascular dementia, unspecified severity, with other behavioral disturbance: Secondary | ICD-10-CM | POA: Diagnosis not present

## 2023-04-12 DIAGNOSIS — R7303 Prediabetes: Secondary | ICD-10-CM | POA: Diagnosis not present

## 2023-04-12 DIAGNOSIS — I10 Essential (primary) hypertension: Secondary | ICD-10-CM | POA: Diagnosis not present

## 2023-04-12 DIAGNOSIS — F0154 Vascular dementia, unspecified severity, with anxiety: Secondary | ICD-10-CM | POA: Diagnosis not present

## 2023-04-12 DIAGNOSIS — G4700 Insomnia, unspecified: Secondary | ICD-10-CM | POA: Diagnosis not present

## 2023-04-12 DIAGNOSIS — R630 Anorexia: Secondary | ICD-10-CM | POA: Diagnosis not present

## 2023-04-18 DIAGNOSIS — F01518 Vascular dementia, unspecified severity, with other behavioral disturbance: Secondary | ICD-10-CM | POA: Diagnosis not present

## 2023-04-18 DIAGNOSIS — F0153 Vascular dementia, unspecified severity, with mood disturbance: Secondary | ICD-10-CM | POA: Diagnosis not present

## 2023-04-18 DIAGNOSIS — E039 Hypothyroidism, unspecified: Secondary | ICD-10-CM | POA: Diagnosis not present

## 2023-04-18 DIAGNOSIS — M5416 Radiculopathy, lumbar region: Secondary | ICD-10-CM | POA: Diagnosis not present

## 2023-04-18 DIAGNOSIS — F319 Bipolar disorder, unspecified: Secondary | ICD-10-CM | POA: Diagnosis not present

## 2023-04-18 DIAGNOSIS — E785 Hyperlipidemia, unspecified: Secondary | ICD-10-CM | POA: Diagnosis not present

## 2023-04-18 DIAGNOSIS — F411 Generalized anxiety disorder: Secondary | ICD-10-CM | POA: Diagnosis not present

## 2023-04-18 DIAGNOSIS — I69320 Aphasia following cerebral infarction: Secondary | ICD-10-CM | POA: Diagnosis not present

## 2023-04-18 DIAGNOSIS — G4700 Insomnia, unspecified: Secondary | ICD-10-CM | POA: Diagnosis not present

## 2023-04-18 DIAGNOSIS — M4800 Spinal stenosis, site unspecified: Secondary | ICD-10-CM | POA: Diagnosis not present

## 2023-04-18 DIAGNOSIS — F0154 Vascular dementia, unspecified severity, with anxiety: Secondary | ICD-10-CM | POA: Diagnosis not present

## 2023-04-18 DIAGNOSIS — R7303 Prediabetes: Secondary | ICD-10-CM | POA: Diagnosis not present

## 2023-04-18 DIAGNOSIS — R519 Headache, unspecified: Secondary | ICD-10-CM | POA: Diagnosis not present

## 2023-04-18 DIAGNOSIS — I10 Essential (primary) hypertension: Secondary | ICD-10-CM | POA: Diagnosis not present

## 2023-04-18 DIAGNOSIS — R630 Anorexia: Secondary | ICD-10-CM | POA: Diagnosis not present

## 2023-04-18 DIAGNOSIS — R42 Dizziness and giddiness: Secondary | ICD-10-CM | POA: Diagnosis not present

## 2023-04-20 DIAGNOSIS — I69320 Aphasia following cerebral infarction: Secondary | ICD-10-CM | POA: Diagnosis not present

## 2023-04-20 DIAGNOSIS — F319 Bipolar disorder, unspecified: Secondary | ICD-10-CM | POA: Diagnosis not present

## 2023-04-20 DIAGNOSIS — G4700 Insomnia, unspecified: Secondary | ICD-10-CM | POA: Diagnosis not present

## 2023-04-20 DIAGNOSIS — E039 Hypothyroidism, unspecified: Secondary | ICD-10-CM | POA: Diagnosis not present

## 2023-04-20 DIAGNOSIS — M5416 Radiculopathy, lumbar region: Secondary | ICD-10-CM | POA: Diagnosis not present

## 2023-04-20 DIAGNOSIS — I10 Essential (primary) hypertension: Secondary | ICD-10-CM | POA: Diagnosis not present

## 2023-04-20 DIAGNOSIS — M4800 Spinal stenosis, site unspecified: Secondary | ICD-10-CM | POA: Diagnosis not present

## 2023-04-20 DIAGNOSIS — R7303 Prediabetes: Secondary | ICD-10-CM | POA: Diagnosis not present

## 2023-04-20 DIAGNOSIS — R42 Dizziness and giddiness: Secondary | ICD-10-CM | POA: Diagnosis not present

## 2023-04-20 DIAGNOSIS — F411 Generalized anxiety disorder: Secondary | ICD-10-CM | POA: Diagnosis not present

## 2023-04-20 DIAGNOSIS — R519 Headache, unspecified: Secondary | ICD-10-CM | POA: Diagnosis not present

## 2023-04-20 DIAGNOSIS — F0154 Vascular dementia, unspecified severity, with anxiety: Secondary | ICD-10-CM | POA: Diagnosis not present

## 2023-04-20 DIAGNOSIS — E785 Hyperlipidemia, unspecified: Secondary | ICD-10-CM | POA: Diagnosis not present

## 2023-04-20 DIAGNOSIS — F0153 Vascular dementia, unspecified severity, with mood disturbance: Secondary | ICD-10-CM | POA: Diagnosis not present

## 2023-04-20 DIAGNOSIS — R630 Anorexia: Secondary | ICD-10-CM | POA: Diagnosis not present

## 2023-04-20 DIAGNOSIS — F01518 Vascular dementia, unspecified severity, with other behavioral disturbance: Secondary | ICD-10-CM | POA: Diagnosis not present

## 2023-04-24 DIAGNOSIS — I10 Essential (primary) hypertension: Secondary | ICD-10-CM | POA: Diagnosis not present

## 2023-04-24 DIAGNOSIS — R519 Headache, unspecified: Secondary | ICD-10-CM | POA: Diagnosis not present

## 2023-04-24 DIAGNOSIS — F0153 Vascular dementia, unspecified severity, with mood disturbance: Secondary | ICD-10-CM | POA: Diagnosis not present

## 2023-04-24 DIAGNOSIS — G4700 Insomnia, unspecified: Secondary | ICD-10-CM | POA: Diagnosis not present

## 2023-04-24 DIAGNOSIS — M5416 Radiculopathy, lumbar region: Secondary | ICD-10-CM | POA: Diagnosis not present

## 2023-04-24 DIAGNOSIS — M4800 Spinal stenosis, site unspecified: Secondary | ICD-10-CM | POA: Diagnosis not present

## 2023-04-24 DIAGNOSIS — I69320 Aphasia following cerebral infarction: Secondary | ICD-10-CM | POA: Diagnosis not present

## 2023-04-24 DIAGNOSIS — E039 Hypothyroidism, unspecified: Secondary | ICD-10-CM | POA: Diagnosis not present

## 2023-04-24 DIAGNOSIS — E785 Hyperlipidemia, unspecified: Secondary | ICD-10-CM | POA: Diagnosis not present

## 2023-04-24 DIAGNOSIS — F319 Bipolar disorder, unspecified: Secondary | ICD-10-CM | POA: Diagnosis not present

## 2023-04-24 DIAGNOSIS — R7303 Prediabetes: Secondary | ICD-10-CM | POA: Diagnosis not present

## 2023-04-24 DIAGNOSIS — R42 Dizziness and giddiness: Secondary | ICD-10-CM | POA: Diagnosis not present

## 2023-04-24 DIAGNOSIS — F411 Generalized anxiety disorder: Secondary | ICD-10-CM | POA: Diagnosis not present

## 2023-04-24 DIAGNOSIS — F0154 Vascular dementia, unspecified severity, with anxiety: Secondary | ICD-10-CM | POA: Diagnosis not present

## 2023-04-24 DIAGNOSIS — F01518 Vascular dementia, unspecified severity, with other behavioral disturbance: Secondary | ICD-10-CM | POA: Diagnosis not present

## 2023-04-24 DIAGNOSIS — R630 Anorexia: Secondary | ICD-10-CM | POA: Diagnosis not present

## 2023-04-25 DIAGNOSIS — F411 Generalized anxiety disorder: Secondary | ICD-10-CM | POA: Diagnosis not present

## 2023-04-25 DIAGNOSIS — F319 Bipolar disorder, unspecified: Secondary | ICD-10-CM | POA: Diagnosis not present

## 2023-04-25 DIAGNOSIS — F5105 Insomnia due to other mental disorder: Secondary | ICD-10-CM | POA: Diagnosis not present

## 2023-04-25 DIAGNOSIS — Z8673 Personal history of transient ischemic attack (TIA), and cerebral infarction without residual deficits: Secondary | ICD-10-CM | POA: Diagnosis not present

## 2023-05-04 DIAGNOSIS — M5416 Radiculopathy, lumbar region: Secondary | ICD-10-CM | POA: Diagnosis not present

## 2023-05-04 DIAGNOSIS — I10 Essential (primary) hypertension: Secondary | ICD-10-CM | POA: Diagnosis not present

## 2023-05-04 DIAGNOSIS — R519 Headache, unspecified: Secondary | ICD-10-CM | POA: Diagnosis not present

## 2023-05-04 DIAGNOSIS — G4700 Insomnia, unspecified: Secondary | ICD-10-CM | POA: Diagnosis not present

## 2023-05-04 DIAGNOSIS — F411 Generalized anxiety disorder: Secondary | ICD-10-CM | POA: Diagnosis not present

## 2023-05-04 DIAGNOSIS — E785 Hyperlipidemia, unspecified: Secondary | ICD-10-CM | POA: Diagnosis not present

## 2023-05-04 DIAGNOSIS — R630 Anorexia: Secondary | ICD-10-CM | POA: Diagnosis not present

## 2023-05-04 DIAGNOSIS — F0153 Vascular dementia, unspecified severity, with mood disturbance: Secondary | ICD-10-CM | POA: Diagnosis not present

## 2023-05-04 DIAGNOSIS — E039 Hypothyroidism, unspecified: Secondary | ICD-10-CM | POA: Diagnosis not present

## 2023-05-04 DIAGNOSIS — R7303 Prediabetes: Secondary | ICD-10-CM | POA: Diagnosis not present

## 2023-05-04 DIAGNOSIS — F0154 Vascular dementia, unspecified severity, with anxiety: Secondary | ICD-10-CM | POA: Diagnosis not present

## 2023-05-04 DIAGNOSIS — F01518 Vascular dementia, unspecified severity, with other behavioral disturbance: Secondary | ICD-10-CM | POA: Diagnosis not present

## 2023-05-04 DIAGNOSIS — M4800 Spinal stenosis, site unspecified: Secondary | ICD-10-CM | POA: Diagnosis not present

## 2023-05-04 DIAGNOSIS — F319 Bipolar disorder, unspecified: Secondary | ICD-10-CM | POA: Diagnosis not present

## 2023-05-04 DIAGNOSIS — R42 Dizziness and giddiness: Secondary | ICD-10-CM | POA: Diagnosis not present

## 2023-05-04 DIAGNOSIS — I69320 Aphasia following cerebral infarction: Secondary | ICD-10-CM | POA: Diagnosis not present

## 2023-05-08 DIAGNOSIS — F319 Bipolar disorder, unspecified: Secondary | ICD-10-CM | POA: Diagnosis not present

## 2023-05-08 DIAGNOSIS — F01518 Vascular dementia, unspecified severity, with other behavioral disturbance: Secondary | ICD-10-CM | POA: Diagnosis not present

## 2023-05-08 DIAGNOSIS — E785 Hyperlipidemia, unspecified: Secondary | ICD-10-CM | POA: Diagnosis not present

## 2023-05-08 DIAGNOSIS — F411 Generalized anxiety disorder: Secondary | ICD-10-CM | POA: Diagnosis not present

## 2023-05-08 DIAGNOSIS — F0153 Vascular dementia, unspecified severity, with mood disturbance: Secondary | ICD-10-CM | POA: Diagnosis not present

## 2023-05-08 DIAGNOSIS — R42 Dizziness and giddiness: Secondary | ICD-10-CM | POA: Diagnosis not present

## 2023-05-08 DIAGNOSIS — R519 Headache, unspecified: Secondary | ICD-10-CM | POA: Diagnosis not present

## 2023-05-08 DIAGNOSIS — M5416 Radiculopathy, lumbar region: Secondary | ICD-10-CM | POA: Diagnosis not present

## 2023-05-08 DIAGNOSIS — E039 Hypothyroidism, unspecified: Secondary | ICD-10-CM | POA: Diagnosis not present

## 2023-05-08 DIAGNOSIS — R7303 Prediabetes: Secondary | ICD-10-CM | POA: Diagnosis not present

## 2023-05-08 DIAGNOSIS — I69320 Aphasia following cerebral infarction: Secondary | ICD-10-CM | POA: Diagnosis not present

## 2023-05-08 DIAGNOSIS — R630 Anorexia: Secondary | ICD-10-CM | POA: Diagnosis not present

## 2023-05-08 DIAGNOSIS — M4800 Spinal stenosis, site unspecified: Secondary | ICD-10-CM | POA: Diagnosis not present

## 2023-05-08 DIAGNOSIS — F0154 Vascular dementia, unspecified severity, with anxiety: Secondary | ICD-10-CM | POA: Diagnosis not present

## 2023-05-08 DIAGNOSIS — G4700 Insomnia, unspecified: Secondary | ICD-10-CM | POA: Diagnosis not present

## 2023-05-08 DIAGNOSIS — I10 Essential (primary) hypertension: Secondary | ICD-10-CM | POA: Diagnosis not present

## 2023-05-09 DIAGNOSIS — F411 Generalized anxiety disorder: Secondary | ICD-10-CM | POA: Diagnosis not present

## 2023-05-09 DIAGNOSIS — F319 Bipolar disorder, unspecified: Secondary | ICD-10-CM | POA: Diagnosis not present

## 2023-05-09 DIAGNOSIS — I6930 Unspecified sequelae of cerebral infarction: Secondary | ICD-10-CM | POA: Diagnosis not present

## 2023-05-09 DIAGNOSIS — F5105 Insomnia due to other mental disorder: Secondary | ICD-10-CM | POA: Diagnosis not present

## 2023-05-18 DIAGNOSIS — F319 Bipolar disorder, unspecified: Secondary | ICD-10-CM | POA: Diagnosis not present

## 2023-05-18 DIAGNOSIS — R42 Dizziness and giddiness: Secondary | ICD-10-CM | POA: Diagnosis not present

## 2023-05-18 DIAGNOSIS — F0153 Vascular dementia, unspecified severity, with mood disturbance: Secondary | ICD-10-CM | POA: Diagnosis not present

## 2023-05-18 DIAGNOSIS — M5416 Radiculopathy, lumbar region: Secondary | ICD-10-CM | POA: Diagnosis not present

## 2023-05-18 DIAGNOSIS — E039 Hypothyroidism, unspecified: Secondary | ICD-10-CM | POA: Diagnosis not present

## 2023-05-18 DIAGNOSIS — G4700 Insomnia, unspecified: Secondary | ICD-10-CM | POA: Diagnosis not present

## 2023-05-18 DIAGNOSIS — R7303 Prediabetes: Secondary | ICD-10-CM | POA: Diagnosis not present

## 2023-05-18 DIAGNOSIS — F01518 Vascular dementia, unspecified severity, with other behavioral disturbance: Secondary | ICD-10-CM | POA: Diagnosis not present

## 2023-05-18 DIAGNOSIS — I69320 Aphasia following cerebral infarction: Secondary | ICD-10-CM | POA: Diagnosis not present

## 2023-05-18 DIAGNOSIS — M4800 Spinal stenosis, site unspecified: Secondary | ICD-10-CM | POA: Diagnosis not present

## 2023-05-18 DIAGNOSIS — E785 Hyperlipidemia, unspecified: Secondary | ICD-10-CM | POA: Diagnosis not present

## 2023-05-18 DIAGNOSIS — I10 Essential (primary) hypertension: Secondary | ICD-10-CM | POA: Diagnosis not present

## 2023-05-18 DIAGNOSIS — R519 Headache, unspecified: Secondary | ICD-10-CM | POA: Diagnosis not present

## 2023-05-18 DIAGNOSIS — R630 Anorexia: Secondary | ICD-10-CM | POA: Diagnosis not present

## 2023-05-18 DIAGNOSIS — F0154 Vascular dementia, unspecified severity, with anxiety: Secondary | ICD-10-CM | POA: Diagnosis not present

## 2023-05-18 DIAGNOSIS — F411 Generalized anxiety disorder: Secondary | ICD-10-CM | POA: Diagnosis not present

## 2023-05-28 DIAGNOSIS — N390 Urinary tract infection, site not specified: Secondary | ICD-10-CM | POA: Diagnosis not present

## 2023-06-06 DIAGNOSIS — F3130 Bipolar disorder, current episode depressed, mild or moderate severity, unspecified: Secondary | ICD-10-CM | POA: Diagnosis not present

## 2023-06-06 DIAGNOSIS — F411 Generalized anxiety disorder: Secondary | ICD-10-CM | POA: Diagnosis not present

## 2023-06-06 DIAGNOSIS — Z8673 Personal history of transient ischemic attack (TIA), and cerebral infarction without residual deficits: Secondary | ICD-10-CM | POA: Diagnosis not present

## 2023-06-06 DIAGNOSIS — F5105 Insomnia due to other mental disorder: Secondary | ICD-10-CM | POA: Diagnosis not present

## 2023-06-10 DIAGNOSIS — N390 Urinary tract infection, site not specified: Secondary | ICD-10-CM | POA: Diagnosis not present

## 2023-06-13 DIAGNOSIS — R892 Abnormal level of other drugs, medicaments and biological substances in specimens from other organs, systems and tissues: Secondary | ICD-10-CM | POA: Diagnosis not present

## 2023-06-20 DIAGNOSIS — M47816 Spondylosis without myelopathy or radiculopathy, lumbar region: Secondary | ICD-10-CM | POA: Diagnosis not present

## 2023-06-21 DIAGNOSIS — I10 Essential (primary) hypertension: Secondary | ICD-10-CM | POA: Diagnosis not present

## 2023-06-21 DIAGNOSIS — R1032 Left lower quadrant pain: Secondary | ICD-10-CM | POA: Diagnosis not present

## 2023-06-21 DIAGNOSIS — R339 Retention of urine, unspecified: Secondary | ICD-10-CM | POA: Diagnosis not present

## 2023-06-21 DIAGNOSIS — M47816 Spondylosis without myelopathy or radiculopathy, lumbar region: Secondary | ICD-10-CM | POA: Diagnosis not present

## 2023-06-21 DIAGNOSIS — M48061 Spinal stenosis, lumbar region without neurogenic claudication: Secondary | ICD-10-CM | POA: Diagnosis not present

## 2023-06-21 DIAGNOSIS — M5135 Other intervertebral disc degeneration, thoracolumbar region: Secondary | ICD-10-CM | POA: Diagnosis not present

## 2023-06-21 DIAGNOSIS — F0390 Unspecified dementia without behavioral disturbance: Secondary | ICD-10-CM | POA: Diagnosis not present

## 2023-06-21 DIAGNOSIS — R4182 Altered mental status, unspecified: Secondary | ICD-10-CM | POA: Diagnosis not present

## 2023-06-21 DIAGNOSIS — R1012 Left upper quadrant pain: Secondary | ICD-10-CM | POA: Diagnosis not present

## 2023-06-21 DIAGNOSIS — Z993 Dependence on wheelchair: Secondary | ICD-10-CM | POA: Diagnosis not present

## 2023-06-21 DIAGNOSIS — M4805 Spinal stenosis, thoracolumbar region: Secondary | ICD-10-CM | POA: Diagnosis not present

## 2023-06-21 DIAGNOSIS — R32 Unspecified urinary incontinence: Secondary | ICD-10-CM | POA: Diagnosis not present

## 2023-06-21 DIAGNOSIS — K5730 Diverticulosis of large intestine without perforation or abscess without bleeding: Secondary | ICD-10-CM | POA: Diagnosis not present

## 2023-06-21 DIAGNOSIS — R1011 Right upper quadrant pain: Secondary | ICD-10-CM | POA: Diagnosis not present

## 2023-06-21 DIAGNOSIS — F01518 Vascular dementia, unspecified severity, with other behavioral disturbance: Secondary | ICD-10-CM | POA: Diagnosis not present

## 2023-06-21 DIAGNOSIS — M545 Low back pain, unspecified: Secondary | ICD-10-CM | POA: Diagnosis not present

## 2023-06-21 DIAGNOSIS — K8689 Other specified diseases of pancreas: Secondary | ICD-10-CM | POA: Diagnosis not present

## 2023-06-21 DIAGNOSIS — R1084 Generalized abdominal pain: Secondary | ICD-10-CM | POA: Diagnosis not present

## 2023-06-21 DIAGNOSIS — K8020 Calculus of gallbladder without cholecystitis without obstruction: Secondary | ICD-10-CM | POA: Diagnosis not present

## 2023-06-21 DIAGNOSIS — M5137 Other intervertebral disc degeneration, lumbosacral region: Secondary | ICD-10-CM | POA: Diagnosis not present

## 2023-06-21 DIAGNOSIS — K828 Other specified diseases of gallbladder: Secondary | ICD-10-CM | POA: Diagnosis not present

## 2023-06-21 DIAGNOSIS — I69311 Memory deficit following cerebral infarction: Secondary | ICD-10-CM | POA: Diagnosis not present

## 2023-06-21 DIAGNOSIS — M549 Dorsalgia, unspecified: Secondary | ICD-10-CM | POA: Diagnosis not present

## 2023-06-21 DIAGNOSIS — Z79899 Other long term (current) drug therapy: Secondary | ICD-10-CM | POA: Diagnosis not present

## 2023-06-22 DIAGNOSIS — M549 Dorsalgia, unspecified: Secondary | ICD-10-CM | POA: Diagnosis not present

## 2023-06-22 DIAGNOSIS — Z7401 Bed confinement status: Secondary | ICD-10-CM | POA: Diagnosis not present

## 2023-06-27 DIAGNOSIS — F319 Bipolar disorder, unspecified: Secondary | ICD-10-CM | POA: Diagnosis not present

## 2023-06-27 DIAGNOSIS — I10 Essential (primary) hypertension: Secondary | ICD-10-CM | POA: Diagnosis not present

## 2023-06-27 DIAGNOSIS — R339 Retention of urine, unspecified: Secondary | ICD-10-CM | POA: Diagnosis not present

## 2023-06-27 DIAGNOSIS — F05 Delirium due to known physiological condition: Secondary | ICD-10-CM | POA: Diagnosis not present

## 2023-06-27 DIAGNOSIS — G8929 Other chronic pain: Secondary | ICD-10-CM | POA: Diagnosis not present

## 2023-06-27 DIAGNOSIS — F03918 Unspecified dementia, unspecified severity, with other behavioral disturbance: Secondary | ICD-10-CM | POA: Diagnosis not present

## 2023-06-27 DIAGNOSIS — Z466 Encounter for fitting and adjustment of urinary device: Secondary | ICD-10-CM | POA: Diagnosis not present

## 2023-06-27 DIAGNOSIS — R7303 Prediabetes: Secondary | ICD-10-CM | POA: Diagnosis not present

## 2023-06-27 DIAGNOSIS — N319 Neuromuscular dysfunction of bladder, unspecified: Secondary | ICD-10-CM | POA: Diagnosis not present

## 2023-06-27 DIAGNOSIS — F0393 Unspecified dementia, unspecified severity, with mood disturbance: Secondary | ICD-10-CM | POA: Diagnosis not present

## 2023-06-27 DIAGNOSIS — M4800 Spinal stenosis, site unspecified: Secondary | ICD-10-CM | POA: Diagnosis not present

## 2023-06-27 DIAGNOSIS — N320 Bladder-neck obstruction: Secondary | ICD-10-CM | POA: Diagnosis not present

## 2023-06-27 DIAGNOSIS — E039 Hypothyroidism, unspecified: Secondary | ICD-10-CM | POA: Diagnosis not present

## 2023-06-27 DIAGNOSIS — G4700 Insomnia, unspecified: Secondary | ICD-10-CM | POA: Diagnosis not present

## 2023-06-27 DIAGNOSIS — M5126 Other intervertebral disc displacement, lumbar region: Secondary | ICD-10-CM | POA: Diagnosis not present

## 2023-06-27 DIAGNOSIS — F03911 Unspecified dementia, unspecified severity, with agitation: Secondary | ICD-10-CM | POA: Diagnosis not present

## 2023-06-28 DIAGNOSIS — N139 Obstructive and reflux uropathy, unspecified: Secondary | ICD-10-CM | POA: Diagnosis not present

## 2023-06-28 DIAGNOSIS — F01518 Vascular dementia, unspecified severity, with other behavioral disturbance: Secondary | ICD-10-CM | POA: Diagnosis not present

## 2023-06-28 DIAGNOSIS — M519 Unspecified thoracic, thoracolumbar and lumbosacral intervertebral disc disorder: Secondary | ICD-10-CM | POA: Diagnosis not present

## 2023-06-28 DIAGNOSIS — I69311 Memory deficit following cerebral infarction: Secondary | ICD-10-CM | POA: Diagnosis not present

## 2023-07-01 DIAGNOSIS — E039 Hypothyroidism, unspecified: Secondary | ICD-10-CM | POA: Diagnosis not present

## 2023-07-01 DIAGNOSIS — F03918 Unspecified dementia, unspecified severity, with other behavioral disturbance: Secondary | ICD-10-CM | POA: Diagnosis not present

## 2023-07-01 DIAGNOSIS — F03911 Unspecified dementia, unspecified severity, with agitation: Secondary | ICD-10-CM | POA: Diagnosis not present

## 2023-07-01 DIAGNOSIS — M5126 Other intervertebral disc displacement, lumbar region: Secondary | ICD-10-CM | POA: Diagnosis not present

## 2023-07-01 DIAGNOSIS — F319 Bipolar disorder, unspecified: Secondary | ICD-10-CM | POA: Diagnosis not present

## 2023-07-01 DIAGNOSIS — G8929 Other chronic pain: Secondary | ICD-10-CM | POA: Diagnosis not present

## 2023-07-01 DIAGNOSIS — R7303 Prediabetes: Secondary | ICD-10-CM | POA: Diagnosis not present

## 2023-07-01 DIAGNOSIS — N320 Bladder-neck obstruction: Secondary | ICD-10-CM | POA: Diagnosis not present

## 2023-07-01 DIAGNOSIS — F0393 Unspecified dementia, unspecified severity, with mood disturbance: Secondary | ICD-10-CM | POA: Diagnosis not present

## 2023-07-01 DIAGNOSIS — N319 Neuromuscular dysfunction of bladder, unspecified: Secondary | ICD-10-CM | POA: Diagnosis not present

## 2023-07-01 DIAGNOSIS — I10 Essential (primary) hypertension: Secondary | ICD-10-CM | POA: Diagnosis not present

## 2023-07-01 DIAGNOSIS — F05 Delirium due to known physiological condition: Secondary | ICD-10-CM | POA: Diagnosis not present

## 2023-07-01 DIAGNOSIS — M4800 Spinal stenosis, site unspecified: Secondary | ICD-10-CM | POA: Diagnosis not present

## 2023-07-01 DIAGNOSIS — G4700 Insomnia, unspecified: Secondary | ICD-10-CM | POA: Diagnosis not present

## 2023-07-01 DIAGNOSIS — R339 Retention of urine, unspecified: Secondary | ICD-10-CM | POA: Diagnosis not present

## 2023-07-01 DIAGNOSIS — Z466 Encounter for fitting and adjustment of urinary device: Secondary | ICD-10-CM | POA: Diagnosis not present

## 2023-07-04 DIAGNOSIS — F29 Unspecified psychosis not due to a substance or known physiological condition: Secondary | ICD-10-CM | POA: Diagnosis not present

## 2023-07-04 DIAGNOSIS — F3112 Bipolar disorder, current episode manic without psychotic features, moderate: Secondary | ICD-10-CM | POA: Diagnosis not present

## 2023-07-04 DIAGNOSIS — F5105 Insomnia due to other mental disorder: Secondary | ICD-10-CM | POA: Diagnosis not present

## 2023-07-04 DIAGNOSIS — F411 Generalized anxiety disorder: Secondary | ICD-10-CM | POA: Diagnosis not present

## 2023-07-04 DIAGNOSIS — R338 Other retention of urine: Secondary | ICD-10-CM | POA: Diagnosis not present

## 2023-07-06 DIAGNOSIS — R7303 Prediabetes: Secondary | ICD-10-CM | POA: Diagnosis not present

## 2023-07-06 DIAGNOSIS — F0393 Unspecified dementia, unspecified severity, with mood disturbance: Secondary | ICD-10-CM | POA: Diagnosis not present

## 2023-07-06 DIAGNOSIS — R339 Retention of urine, unspecified: Secondary | ICD-10-CM | POA: Diagnosis not present

## 2023-07-06 DIAGNOSIS — M5126 Other intervertebral disc displacement, lumbar region: Secondary | ICD-10-CM | POA: Diagnosis not present

## 2023-07-06 DIAGNOSIS — M4800 Spinal stenosis, site unspecified: Secondary | ICD-10-CM | POA: Diagnosis not present

## 2023-07-06 DIAGNOSIS — E039 Hypothyroidism, unspecified: Secondary | ICD-10-CM | POA: Diagnosis not present

## 2023-07-06 DIAGNOSIS — N320 Bladder-neck obstruction: Secondary | ICD-10-CM | POA: Diagnosis not present

## 2023-07-06 DIAGNOSIS — Z466 Encounter for fitting and adjustment of urinary device: Secondary | ICD-10-CM | POA: Diagnosis not present

## 2023-07-06 DIAGNOSIS — G8929 Other chronic pain: Secondary | ICD-10-CM | POA: Diagnosis not present

## 2023-07-06 DIAGNOSIS — I10 Essential (primary) hypertension: Secondary | ICD-10-CM | POA: Diagnosis not present

## 2023-07-06 DIAGNOSIS — F03918 Unspecified dementia, unspecified severity, with other behavioral disturbance: Secondary | ICD-10-CM | POA: Diagnosis not present

## 2023-07-06 DIAGNOSIS — G4700 Insomnia, unspecified: Secondary | ICD-10-CM | POA: Diagnosis not present

## 2023-07-06 DIAGNOSIS — F319 Bipolar disorder, unspecified: Secondary | ICD-10-CM | POA: Diagnosis not present

## 2023-07-06 DIAGNOSIS — F03911 Unspecified dementia, unspecified severity, with agitation: Secondary | ICD-10-CM | POA: Diagnosis not present

## 2023-07-06 DIAGNOSIS — N319 Neuromuscular dysfunction of bladder, unspecified: Secondary | ICD-10-CM | POA: Diagnosis not present

## 2023-07-06 DIAGNOSIS — F05 Delirium due to known physiological condition: Secondary | ICD-10-CM | POA: Diagnosis not present

## 2023-07-12 DIAGNOSIS — E039 Hypothyroidism, unspecified: Secondary | ICD-10-CM | POA: Diagnosis not present

## 2023-07-12 DIAGNOSIS — F0154 Vascular dementia, unspecified severity, with anxiety: Secondary | ICD-10-CM | POA: Diagnosis not present

## 2023-07-12 DIAGNOSIS — Z466 Encounter for fitting and adjustment of urinary device: Secondary | ICD-10-CM | POA: Diagnosis not present

## 2023-07-12 DIAGNOSIS — F05 Delirium due to known physiological condition: Secondary | ICD-10-CM | POA: Diagnosis not present

## 2023-07-12 DIAGNOSIS — M4800 Spinal stenosis, site unspecified: Secondary | ICD-10-CM | POA: Diagnosis not present

## 2023-07-12 DIAGNOSIS — F319 Bipolar disorder, unspecified: Secondary | ICD-10-CM | POA: Diagnosis not present

## 2023-07-12 DIAGNOSIS — F01518 Vascular dementia, unspecified severity, with other behavioral disturbance: Secondary | ICD-10-CM | POA: Diagnosis not present

## 2023-07-12 DIAGNOSIS — N320 Bladder-neck obstruction: Secondary | ICD-10-CM | POA: Diagnosis not present

## 2023-07-12 DIAGNOSIS — F03918 Unspecified dementia, unspecified severity, with other behavioral disturbance: Secondary | ICD-10-CM | POA: Diagnosis not present

## 2023-07-12 DIAGNOSIS — N139 Obstructive and reflux uropathy, unspecified: Secondary | ICD-10-CM | POA: Diagnosis not present

## 2023-07-12 DIAGNOSIS — R7303 Prediabetes: Secondary | ICD-10-CM | POA: Diagnosis not present

## 2023-07-12 DIAGNOSIS — F03911 Unspecified dementia, unspecified severity, with agitation: Secondary | ICD-10-CM | POA: Diagnosis not present

## 2023-07-12 DIAGNOSIS — M5126 Other intervertebral disc displacement, lumbar region: Secondary | ICD-10-CM | POA: Diagnosis not present

## 2023-07-12 DIAGNOSIS — G8929 Other chronic pain: Secondary | ICD-10-CM | POA: Diagnosis not present

## 2023-07-12 DIAGNOSIS — R339 Retention of urine, unspecified: Secondary | ICD-10-CM | POA: Diagnosis not present

## 2023-07-12 DIAGNOSIS — I10 Essential (primary) hypertension: Secondary | ICD-10-CM | POA: Diagnosis not present

## 2023-07-12 DIAGNOSIS — N319 Neuromuscular dysfunction of bladder, unspecified: Secondary | ICD-10-CM | POA: Diagnosis not present

## 2023-07-12 DIAGNOSIS — I69311 Memory deficit following cerebral infarction: Secondary | ICD-10-CM | POA: Diagnosis not present

## 2023-07-12 DIAGNOSIS — F0393 Unspecified dementia, unspecified severity, with mood disturbance: Secondary | ICD-10-CM | POA: Diagnosis not present

## 2023-07-12 DIAGNOSIS — G4700 Insomnia, unspecified: Secondary | ICD-10-CM | POA: Diagnosis not present

## 2023-07-20 DIAGNOSIS — G8929 Other chronic pain: Secondary | ICD-10-CM | POA: Diagnosis not present

## 2023-07-20 DIAGNOSIS — Z466 Encounter for fitting and adjustment of urinary device: Secondary | ICD-10-CM | POA: Diagnosis not present

## 2023-07-20 DIAGNOSIS — F05 Delirium due to known physiological condition: Secondary | ICD-10-CM | POA: Diagnosis not present

## 2023-07-20 DIAGNOSIS — G4700 Insomnia, unspecified: Secondary | ICD-10-CM | POA: Diagnosis not present

## 2023-07-20 DIAGNOSIS — F0393 Unspecified dementia, unspecified severity, with mood disturbance: Secondary | ICD-10-CM | POA: Diagnosis not present

## 2023-07-20 DIAGNOSIS — F03911 Unspecified dementia, unspecified severity, with agitation: Secondary | ICD-10-CM | POA: Diagnosis not present

## 2023-07-20 DIAGNOSIS — R7303 Prediabetes: Secondary | ICD-10-CM | POA: Diagnosis not present

## 2023-07-20 DIAGNOSIS — F319 Bipolar disorder, unspecified: Secondary | ICD-10-CM | POA: Diagnosis not present

## 2023-07-20 DIAGNOSIS — M5126 Other intervertebral disc displacement, lumbar region: Secondary | ICD-10-CM | POA: Diagnosis not present

## 2023-07-20 DIAGNOSIS — R339 Retention of urine, unspecified: Secondary | ICD-10-CM | POA: Diagnosis not present

## 2023-07-20 DIAGNOSIS — F03918 Unspecified dementia, unspecified severity, with other behavioral disturbance: Secondary | ICD-10-CM | POA: Diagnosis not present

## 2023-07-20 DIAGNOSIS — E039 Hypothyroidism, unspecified: Secondary | ICD-10-CM | POA: Diagnosis not present

## 2023-07-20 DIAGNOSIS — I10 Essential (primary) hypertension: Secondary | ICD-10-CM | POA: Diagnosis not present

## 2023-07-20 DIAGNOSIS — N319 Neuromuscular dysfunction of bladder, unspecified: Secondary | ICD-10-CM | POA: Diagnosis not present

## 2023-07-20 DIAGNOSIS — M4800 Spinal stenosis, site unspecified: Secondary | ICD-10-CM | POA: Diagnosis not present

## 2023-07-20 DIAGNOSIS — N320 Bladder-neck obstruction: Secondary | ICD-10-CM | POA: Diagnosis not present

## 2023-07-25 DIAGNOSIS — G4700 Insomnia, unspecified: Secondary | ICD-10-CM | POA: Diagnosis not present

## 2023-07-25 DIAGNOSIS — F05 Delirium due to known physiological condition: Secondary | ICD-10-CM | POA: Diagnosis not present

## 2023-07-25 DIAGNOSIS — M5126 Other intervertebral disc displacement, lumbar region: Secondary | ICD-10-CM | POA: Diagnosis not present

## 2023-07-25 DIAGNOSIS — M4800 Spinal stenosis, site unspecified: Secondary | ICD-10-CM | POA: Diagnosis not present

## 2023-07-25 DIAGNOSIS — R7303 Prediabetes: Secondary | ICD-10-CM | POA: Diagnosis not present

## 2023-07-25 DIAGNOSIS — I10 Essential (primary) hypertension: Secondary | ICD-10-CM | POA: Diagnosis not present

## 2023-07-25 DIAGNOSIS — F03918 Unspecified dementia, unspecified severity, with other behavioral disturbance: Secondary | ICD-10-CM | POA: Diagnosis not present

## 2023-07-25 DIAGNOSIS — F03911 Unspecified dementia, unspecified severity, with agitation: Secondary | ICD-10-CM | POA: Diagnosis not present

## 2023-07-25 DIAGNOSIS — G8929 Other chronic pain: Secondary | ICD-10-CM | POA: Diagnosis not present

## 2023-07-25 DIAGNOSIS — R339 Retention of urine, unspecified: Secondary | ICD-10-CM | POA: Diagnosis not present

## 2023-07-25 DIAGNOSIS — N319 Neuromuscular dysfunction of bladder, unspecified: Secondary | ICD-10-CM | POA: Diagnosis not present

## 2023-07-25 DIAGNOSIS — Z466 Encounter for fitting and adjustment of urinary device: Secondary | ICD-10-CM | POA: Diagnosis not present

## 2023-07-25 DIAGNOSIS — E039 Hypothyroidism, unspecified: Secondary | ICD-10-CM | POA: Diagnosis not present

## 2023-07-25 DIAGNOSIS — F0393 Unspecified dementia, unspecified severity, with mood disturbance: Secondary | ICD-10-CM | POA: Diagnosis not present

## 2023-07-25 DIAGNOSIS — N320 Bladder-neck obstruction: Secondary | ICD-10-CM | POA: Diagnosis not present

## 2023-07-25 DIAGNOSIS — F319 Bipolar disorder, unspecified: Secondary | ICD-10-CM | POA: Diagnosis not present

## 2023-08-01 DIAGNOSIS — Z8669 Personal history of other diseases of the nervous system and sense organs: Secondary | ICD-10-CM | POA: Diagnosis not present

## 2023-08-01 DIAGNOSIS — Z8673 Personal history of transient ischemic attack (TIA), and cerebral infarction without residual deficits: Secondary | ICD-10-CM | POA: Diagnosis not present

## 2023-08-01 DIAGNOSIS — F0154 Vascular dementia, unspecified severity, with anxiety: Secondary | ICD-10-CM | POA: Diagnosis not present

## 2023-08-01 DIAGNOSIS — I679 Cerebrovascular disease, unspecified: Secondary | ICD-10-CM | POA: Diagnosis not present

## 2023-08-03 DIAGNOSIS — F5105 Insomnia due to other mental disorder: Secondary | ICD-10-CM | POA: Diagnosis not present

## 2023-08-03 DIAGNOSIS — F29 Unspecified psychosis not due to a substance or known physiological condition: Secondary | ICD-10-CM | POA: Diagnosis not present

## 2023-08-03 DIAGNOSIS — F411 Generalized anxiety disorder: Secondary | ICD-10-CM | POA: Diagnosis not present

## 2023-08-03 DIAGNOSIS — F3112 Bipolar disorder, current episode manic without psychotic features, moderate: Secondary | ICD-10-CM | POA: Diagnosis not present

## 2023-08-09 DIAGNOSIS — Z466 Encounter for fitting and adjustment of urinary device: Secondary | ICD-10-CM | POA: Diagnosis not present

## 2023-08-09 DIAGNOSIS — F0152 Vascular dementia, unspecified severity, with psychotic disturbance: Secondary | ICD-10-CM | POA: Diagnosis not present

## 2023-08-09 DIAGNOSIS — I69311 Memory deficit following cerebral infarction: Secondary | ICD-10-CM | POA: Diagnosis not present

## 2023-08-09 DIAGNOSIS — R339 Retention of urine, unspecified: Secondary | ICD-10-CM | POA: Diagnosis not present

## 2023-08-09 DIAGNOSIS — F05 Delirium due to known physiological condition: Secondary | ICD-10-CM | POA: Diagnosis not present

## 2023-08-09 DIAGNOSIS — E039 Hypothyroidism, unspecified: Secondary | ICD-10-CM | POA: Diagnosis not present

## 2023-08-09 DIAGNOSIS — M4800 Spinal stenosis, site unspecified: Secondary | ICD-10-CM | POA: Diagnosis not present

## 2023-08-09 DIAGNOSIS — F01518 Vascular dementia, unspecified severity, with other behavioral disturbance: Secondary | ICD-10-CM | POA: Diagnosis not present

## 2023-08-09 DIAGNOSIS — F03911 Unspecified dementia, unspecified severity, with agitation: Secondary | ICD-10-CM | POA: Diagnosis not present

## 2023-08-09 DIAGNOSIS — I10 Essential (primary) hypertension: Secondary | ICD-10-CM | POA: Diagnosis not present

## 2023-08-09 DIAGNOSIS — F0393 Unspecified dementia, unspecified severity, with mood disturbance: Secondary | ICD-10-CM | POA: Diagnosis not present

## 2023-08-09 DIAGNOSIS — N319 Neuromuscular dysfunction of bladder, unspecified: Secondary | ICD-10-CM | POA: Diagnosis not present

## 2023-08-09 DIAGNOSIS — G4700 Insomnia, unspecified: Secondary | ICD-10-CM | POA: Diagnosis not present

## 2023-08-09 DIAGNOSIS — N320 Bladder-neck obstruction: Secondary | ICD-10-CM | POA: Diagnosis not present

## 2023-08-09 DIAGNOSIS — F319 Bipolar disorder, unspecified: Secondary | ICD-10-CM | POA: Diagnosis not present

## 2023-08-09 DIAGNOSIS — M5126 Other intervertebral disc displacement, lumbar region: Secondary | ICD-10-CM | POA: Diagnosis not present

## 2023-08-09 DIAGNOSIS — F03918 Unspecified dementia, unspecified severity, with other behavioral disturbance: Secondary | ICD-10-CM | POA: Diagnosis not present

## 2023-08-09 DIAGNOSIS — F0154 Vascular dementia, unspecified severity, with anxiety: Secondary | ICD-10-CM | POA: Diagnosis not present

## 2023-08-09 DIAGNOSIS — G8929 Other chronic pain: Secondary | ICD-10-CM | POA: Diagnosis not present

## 2023-08-09 DIAGNOSIS — R7303 Prediabetes: Secondary | ICD-10-CM | POA: Diagnosis not present

## 2023-08-10 DIAGNOSIS — E785 Hyperlipidemia, unspecified: Secondary | ICD-10-CM | POA: Diagnosis not present

## 2023-08-10 DIAGNOSIS — E039 Hypothyroidism, unspecified: Secondary | ICD-10-CM | POA: Diagnosis not present

## 2023-08-10 DIAGNOSIS — I10 Essential (primary) hypertension: Secondary | ICD-10-CM | POA: Diagnosis not present

## 2023-08-29 DIAGNOSIS — F411 Generalized anxiety disorder: Secondary | ICD-10-CM | POA: Diagnosis not present

## 2023-08-29 DIAGNOSIS — F5105 Insomnia due to other mental disorder: Secondary | ICD-10-CM | POA: Diagnosis not present

## 2023-08-29 DIAGNOSIS — F3112 Bipolar disorder, current episode manic without psychotic features, moderate: Secondary | ICD-10-CM | POA: Diagnosis not present

## 2023-08-29 DIAGNOSIS — F29 Unspecified psychosis not due to a substance or known physiological condition: Secondary | ICD-10-CM | POA: Diagnosis not present

## 2023-09-08 DIAGNOSIS — I10 Essential (primary) hypertension: Secondary | ICD-10-CM | POA: Diagnosis not present

## 2023-09-26 DIAGNOSIS — Z8673 Personal history of transient ischemic attack (TIA), and cerebral infarction without residual deficits: Secondary | ICD-10-CM | POA: Diagnosis not present

## 2023-09-26 DIAGNOSIS — F3110 Bipolar disorder, current episode manic without psychotic features, unspecified: Secondary | ICD-10-CM | POA: Diagnosis not present

## 2023-09-26 DIAGNOSIS — F411 Generalized anxiety disorder: Secondary | ICD-10-CM | POA: Diagnosis not present

## 2023-09-26 DIAGNOSIS — F5105 Insomnia due to other mental disorder: Secondary | ICD-10-CM | POA: Diagnosis not present

## 2023-10-13 DIAGNOSIS — I69311 Memory deficit following cerebral infarction: Secondary | ICD-10-CM | POA: Diagnosis not present

## 2023-10-13 DIAGNOSIS — I10 Essential (primary) hypertension: Secondary | ICD-10-CM | POA: Diagnosis not present

## 2023-10-13 DIAGNOSIS — F01518 Vascular dementia, unspecified severity, with other behavioral disturbance: Secondary | ICD-10-CM | POA: Diagnosis not present

## 2023-10-24 DIAGNOSIS — F3112 Bipolar disorder, current episode manic without psychotic features, moderate: Secondary | ICD-10-CM | POA: Diagnosis not present

## 2023-10-24 DIAGNOSIS — Z8673 Personal history of transient ischemic attack (TIA), and cerebral infarction without residual deficits: Secondary | ICD-10-CM | POA: Diagnosis not present

## 2023-10-24 DIAGNOSIS — F411 Generalized anxiety disorder: Secondary | ICD-10-CM | POA: Diagnosis not present

## 2023-10-24 DIAGNOSIS — F5105 Insomnia due to other mental disorder: Secondary | ICD-10-CM | POA: Diagnosis not present

## 2023-11-20 DIAGNOSIS — I10 Essential (primary) hypertension: Secondary | ICD-10-CM | POA: Diagnosis not present

## 2023-11-21 DIAGNOSIS — F411 Generalized anxiety disorder: Secondary | ICD-10-CM | POA: Diagnosis not present

## 2023-11-21 DIAGNOSIS — Z8673 Personal history of transient ischemic attack (TIA), and cerebral infarction without residual deficits: Secondary | ICD-10-CM | POA: Diagnosis not present

## 2023-11-21 DIAGNOSIS — F5105 Insomnia due to other mental disorder: Secondary | ICD-10-CM | POA: Diagnosis not present

## 2023-11-21 DIAGNOSIS — F3130 Bipolar disorder, current episode depressed, mild or moderate severity, unspecified: Secondary | ICD-10-CM | POA: Diagnosis not present

## 2024-11-21 DEATH — deceased
